# Patient Record
Sex: Male | Born: 1935 | Race: White | Hispanic: No | Marital: Married | Smoking: Former smoker
Health system: Southern US, Community
[De-identification: ages and names within clinical notes are randomized; demographics above are authoritative.]

## PROBLEM LIST (undated history)

## (undated) DIAGNOSIS — N400 Enlarged prostate without lower urinary tract symptoms: Secondary | ICD-10-CM

## (undated) DIAGNOSIS — K2970 Gastritis, unspecified, without bleeding: Secondary | ICD-10-CM

## (undated) DIAGNOSIS — R42 Dizziness and giddiness: Secondary | ICD-10-CM

## (undated) DIAGNOSIS — I10 Essential (primary) hypertension: Secondary | ICD-10-CM

## (undated) DIAGNOSIS — K648 Other hemorrhoids: Secondary | ICD-10-CM

## (undated) DIAGNOSIS — I2510 Atherosclerotic heart disease of native coronary artery without angina pectoris: Secondary | ICD-10-CM

## (undated) DIAGNOSIS — J189 Pneumonia, unspecified organism: Secondary | ICD-10-CM

## (undated) DIAGNOSIS — R61 Generalized hyperhidrosis: Secondary | ICD-10-CM

## (undated) DIAGNOSIS — M1990 Unspecified osteoarthritis, unspecified site: Secondary | ICD-10-CM

## (undated) DIAGNOSIS — K5730 Diverticulosis of large intestine without perforation or abscess without bleeding: Secondary | ICD-10-CM

## (undated) DIAGNOSIS — F0390 Unspecified dementia without behavioral disturbance: Secondary | ICD-10-CM

## (undated) DIAGNOSIS — E119 Type 2 diabetes mellitus without complications: Secondary | ICD-10-CM

## (undated) DIAGNOSIS — R002 Palpitations: Secondary | ICD-10-CM

## (undated) DIAGNOSIS — E538 Deficiency of other specified B group vitamins: Secondary | ICD-10-CM

## (undated) DIAGNOSIS — F419 Anxiety disorder, unspecified: Secondary | ICD-10-CM

## (undated) DIAGNOSIS — Z860100 Personal history of colon polyps, unspecified: Secondary | ICD-10-CM

## (undated) DIAGNOSIS — K219 Gastro-esophageal reflux disease without esophagitis: Secondary | ICD-10-CM

## (undated) DIAGNOSIS — K7581 Nonalcoholic steatohepatitis (NASH): Secondary | ICD-10-CM

## (undated) DIAGNOSIS — Z7901 Long term (current) use of anticoagulants: Secondary | ICD-10-CM

## (undated) DIAGNOSIS — K745 Biliary cirrhosis, unspecified: Secondary | ICD-10-CM

## (undated) DIAGNOSIS — I480 Paroxysmal atrial fibrillation: Secondary | ICD-10-CM

## (undated) DIAGNOSIS — IMO0002 Reserved for concepts with insufficient information to code with codable children: Secondary | ICD-10-CM

## (undated) DIAGNOSIS — E785 Hyperlipidemia, unspecified: Secondary | ICD-10-CM

## (undated) DIAGNOSIS — K2990 Gastroduodenitis, unspecified, without bleeding: Secondary | ICD-10-CM

## (undated) DIAGNOSIS — J449 Chronic obstructive pulmonary disease, unspecified: Secondary | ICD-10-CM

## (undated) DIAGNOSIS — I219 Acute myocardial infarction, unspecified: Secondary | ICD-10-CM

## (undated) DIAGNOSIS — N189 Chronic kidney disease, unspecified: Secondary | ICD-10-CM

## (undated) HISTORY — DX: Nonalcoholic steatohepatitis (NASH): K75.81

## (undated) HISTORY — DX: Acute myocardial infarction, unspecified: I21.9

## (undated) HISTORY — DX: Chronic obstructive pulmonary disease, unspecified: J44.9

## (undated) HISTORY — DX: Anxiety disorder, unspecified: F41.9

## (undated) HISTORY — DX: Generalized hyperhidrosis: R61

## (undated) HISTORY — DX: Atherosclerotic heart disease of native coronary artery without angina pectoris: I25.10

## (undated) HISTORY — DX: Gastroduodenitis, unspecified, without bleeding: K29.90

## (undated) HISTORY — DX: Essential (primary) hypertension: I10

## (undated) HISTORY — DX: Reserved for concepts with insufficient information to code with codable children: IMO0002

## (undated) HISTORY — DX: Gastritis, unspecified, without bleeding: K29.70

## (undated) HISTORY — PX: EYE SURGERY: SHX253

## (undated) HISTORY — DX: Personal history of colon polyps, unspecified: Z86.0100

## (undated) HISTORY — DX: Type 2 diabetes mellitus without complications: E11.9

## (undated) HISTORY — DX: Paroxysmal atrial fibrillation: I48.0

## (undated) HISTORY — DX: Personal history of colonic polyps: Z86.010

## (undated) HISTORY — DX: Dizziness and giddiness: R42

## (undated) HISTORY — DX: Benign prostatic hyperplasia without lower urinary tract symptoms: N40.0

## (undated) HISTORY — DX: Diverticulosis of large intestine without perforation or abscess without bleeding: K57.30

## (undated) HISTORY — DX: Gastro-esophageal reflux disease without esophagitis: K21.9

## (undated) HISTORY — DX: Hyperlipidemia, unspecified: E78.5

## (undated) HISTORY — DX: Deficiency of other specified B group vitamins: E53.8

## (undated) HISTORY — DX: Unspecified osteoarthritis, unspecified site: M19.90

## (undated) HISTORY — DX: Biliary cirrhosis, unspecified: K74.5

## (undated) HISTORY — PX: CATARACT EXTRACTION: SUR2

## (undated) HISTORY — DX: Other hemorrhoids: K64.8

## (undated) HISTORY — DX: Palpitations: R00.2

---

## 1999-04-21 ENCOUNTER — Encounter (INDEPENDENT_AMBULATORY_CARE_PROVIDER_SITE_OTHER): Payer: Self-pay | Admitting: Specialist

## 1999-04-21 ENCOUNTER — Other Ambulatory Visit: Admission: RE | Admit: 1999-04-21 | Discharge: 1999-04-21 | Payer: Self-pay | Admitting: Gastroenterology

## 1999-09-27 ENCOUNTER — Encounter: Payer: Self-pay | Admitting: Urology

## 1999-09-29 ENCOUNTER — Encounter (INDEPENDENT_AMBULATORY_CARE_PROVIDER_SITE_OTHER): Payer: Self-pay

## 1999-09-29 ENCOUNTER — Ambulatory Visit (HOSPITAL_COMMUNITY): Admission: RE | Admit: 1999-09-29 | Discharge: 1999-09-29 | Payer: Self-pay | Admitting: Urology

## 2001-02-05 ENCOUNTER — Ambulatory Visit (HOSPITAL_COMMUNITY): Admission: RE | Admit: 2001-02-05 | Discharge: 2001-02-05 | Payer: Self-pay | Admitting: Gastroenterology

## 2001-02-05 ENCOUNTER — Encounter (INDEPENDENT_AMBULATORY_CARE_PROVIDER_SITE_OTHER): Payer: Self-pay

## 2001-02-05 ENCOUNTER — Encounter: Payer: Self-pay | Admitting: Gastroenterology

## 2002-07-08 DIAGNOSIS — D126 Benign neoplasm of colon, unspecified: Secondary | ICD-10-CM

## 2002-11-04 ENCOUNTER — Encounter: Payer: Self-pay | Admitting: Gastroenterology

## 2005-10-06 ENCOUNTER — Ambulatory Visit: Payer: Self-pay | Admitting: Gastroenterology

## 2005-10-11 ENCOUNTER — Ambulatory Visit: Payer: Self-pay | Admitting: Gastroenterology

## 2005-10-12 ENCOUNTER — Ambulatory Visit: Payer: Self-pay | Admitting: Gastroenterology

## 2006-10-05 ENCOUNTER — Ambulatory Visit: Payer: Self-pay | Admitting: Gastroenterology

## 2006-10-05 LAB — CONVERTED CEMR LAB
ALT: 25 units/L (ref 0–40)
AST: 25 units/L (ref 0–37)
Albumin: 3.8 g/dL (ref 3.5–5.2)
Alkaline Phosphatase: 75 units/L (ref 39–117)
BUN: 29 mg/dL — ABNORMAL HIGH (ref 6–23)
Basophils Absolute: 0 10*3/uL (ref 0.0–0.1)
Basophils Relative: 0.6 % (ref 0.0–1.0)
CO2: 25 meq/L (ref 19–32)
Calcium: 9 mg/dL (ref 8.4–10.5)
Chloride: 102 meq/L (ref 96–112)
Creatinine, Ser: 1.3 mg/dL (ref 0.4–1.5)
Eosinophil percent: 3.3 % (ref 0.0–5.0)
GFR calc non Af Amer: 57 mL/min
Glomerular Filtration Rate, Af Am: 68 mL/min/{1.73_m2}
Glucose, Bld: 120 mg/dL — ABNORMAL HIGH (ref 70–99)
HCT: 39.8 % (ref 39.0–52.0)
Hemoglobin: 13.2 g/dL (ref 13.0–17.0)
INR: 1.1 (ref 0.9–2.0)
Lymphocytes Relative: 26 % (ref 12.0–46.0)
MCHC: 33.3 g/dL (ref 30.0–36.0)
MCV: 92.9 fL (ref 78.0–100.0)
Monocytes Absolute: 0.4 10*3/uL (ref 0.2–0.7)
Monocytes Relative: 6.1 % (ref 3.0–11.0)
Neutro Abs: 4.2 10*3/uL (ref 1.4–7.7)
Neutrophils Relative %: 64 % (ref 43.0–77.0)
Platelets: 194 10*3/uL (ref 150–400)
Potassium: 3.7 meq/L (ref 3.5–5.1)
Prothrombin Time: 12.8 s (ref 10.0–14.0)
RBC: 4.29 M/uL (ref 4.22–5.81)
RDW: 13.4 % (ref 11.5–14.6)
Sodium: 136 meq/L (ref 135–145)
Total Bilirubin: 0.8 mg/dL (ref 0.3–1.2)
Total Protein: 7.1 g/dL (ref 6.0–8.3)
WBC: 6.5 10*3/uL (ref 4.5–10.5)

## 2006-10-10 ENCOUNTER — Ambulatory Visit: Payer: Self-pay | Admitting: Gastroenterology

## 2007-09-21 ENCOUNTER — Ambulatory Visit: Payer: Self-pay | Admitting: Gastroenterology

## 2007-09-21 ENCOUNTER — Ambulatory Visit (HOSPITAL_COMMUNITY): Admission: RE | Admit: 2007-09-21 | Discharge: 2007-09-21 | Payer: Self-pay | Admitting: Gastroenterology

## 2007-09-21 LAB — CONVERTED CEMR LAB
AFP-Tumor Marker: 2.4 ng/mL (ref 0.0–8.0)
Ammonia: 12 umol/L (ref 11–35)
Basophils Absolute: 0 10*3/uL (ref 0.0–0.1)
Basophils Relative: 0.6 % (ref 0.0–1.0)
Eosinophils Absolute: 0.1 10*3/uL (ref 0.0–0.6)
Eosinophils Relative: 1.9 % (ref 0.0–5.0)
HCT: 36.5 % — ABNORMAL LOW (ref 39.0–52.0)
Hemoglobin: 12.6 g/dL — ABNORMAL LOW (ref 13.0–17.0)
INR: 1 (ref 0.8–1.0)
Lymphocytes Relative: 25 % (ref 12.0–46.0)
MCHC: 34.4 g/dL (ref 30.0–36.0)
MCV: 93.9 fL (ref 78.0–100.0)
Monocytes Absolute: 0.4 10*3/uL (ref 0.2–0.7)
Monocytes Relative: 5.6 % (ref 3.0–11.0)
Neutro Abs: 4.9 10*3/uL (ref 1.4–7.7)
Neutrophils Relative %: 66.9 % (ref 43.0–77.0)
Platelets: 177 10*3/uL (ref 150–400)
Prothrombin Time: 12.4 s (ref 10.9–13.3)
RBC: 3.88 M/uL — ABNORMAL LOW (ref 4.22–5.81)
RDW: 12.9 % (ref 11.5–14.6)
WBC: 7.2 10*3/uL (ref 4.5–10.5)

## 2007-10-02 ENCOUNTER — Ambulatory Visit: Payer: Self-pay | Admitting: Gastroenterology

## 2007-10-02 LAB — CONVERTED CEMR LAB
ALT: 26 U/L
AST: 23 U/L
Albumin: 3.7 g/dL
Alkaline Phosphatase: 94 U/L
Bilirubin, Direct: 0.1 mg/dL
Total Bilirubin: 0.7 mg/dL
Total Protein: 7.6 g/dL

## 2007-11-19 DIAGNOSIS — Z87898 Personal history of other specified conditions: Secondary | ICD-10-CM

## 2007-11-19 DIAGNOSIS — K648 Other hemorrhoids: Secondary | ICD-10-CM | POA: Insufficient documentation

## 2007-11-19 DIAGNOSIS — E669 Obesity, unspecified: Secondary | ICD-10-CM

## 2007-11-19 DIAGNOSIS — E785 Hyperlipidemia, unspecified: Secondary | ICD-10-CM

## 2007-11-19 DIAGNOSIS — K745 Biliary cirrhosis, unspecified: Secondary | ICD-10-CM

## 2007-11-19 DIAGNOSIS — I10 Essential (primary) hypertension: Secondary | ICD-10-CM | POA: Insufficient documentation

## 2007-11-19 DIAGNOSIS — K5730 Diverticulosis of large intestine without perforation or abscess without bleeding: Secondary | ICD-10-CM | POA: Insufficient documentation

## 2007-11-19 DIAGNOSIS — K219 Gastro-esophageal reflux disease without esophagitis: Secondary | ICD-10-CM | POA: Insufficient documentation

## 2007-11-23 ENCOUNTER — Emergency Department (HOSPITAL_COMMUNITY): Admission: EM | Admit: 2007-11-23 | Discharge: 2007-11-23 | Payer: Self-pay | Admitting: Emergency Medicine

## 2008-05-12 ENCOUNTER — Telehealth: Payer: Self-pay | Admitting: Gastroenterology

## 2008-09-10 ENCOUNTER — Encounter: Payer: Self-pay | Admitting: Gastroenterology

## 2009-03-19 ENCOUNTER — Telehealth: Payer: Self-pay | Admitting: Gastroenterology

## 2009-03-27 ENCOUNTER — Ambulatory Visit: Payer: Self-pay | Admitting: Gastroenterology

## 2009-03-27 LAB — CONVERTED CEMR LAB
ALT: 21 units/L (ref 0–53)
AST: 23 units/L (ref 0–37)
Albumin: 3.7 g/dL (ref 3.5–5.2)
Alkaline Phosphatase: 80 units/L (ref 39–117)
BUN: 28 mg/dL — ABNORMAL HIGH (ref 6–23)
Basophils Absolute: 0 10*3/uL (ref 0.0–0.1)
Basophils Relative: 0.7 % (ref 0.0–3.0)
Bilirubin, Direct: 0.2 mg/dL (ref 0.0–0.3)
CO2: 30 meq/L (ref 19–32)
Calcium: 8.9 mg/dL (ref 8.4–10.5)
Chloride: 109 meq/L (ref 96–112)
Creatinine, Ser: 1.3 mg/dL (ref 0.4–1.5)
Eosinophils Absolute: 0.1 10*3/uL (ref 0.0–0.7)
Eosinophils Relative: 1.8 % (ref 0.0–5.0)
GFR calc non Af Amer: 56.17 mL/min (ref >60)
Glucose, Bld: 131 mg/dL — ABNORMAL HIGH (ref 70–99)
HCT: 37.6 % — ABNORMAL LOW (ref 39.0–52.0)
Hemoglobin: 12.8 g/dL — ABNORMAL LOW (ref 13.0–17.0)
Lymphocytes Relative: 21.5 % (ref 12.0–46.0)
Lymphs Abs: 1.2 10*3/uL (ref 0.7–4.0)
MCHC: 34.2 g/dL (ref 30.0–36.0)
MCV: 94.9 fL (ref 78.0–100.0)
Monocytes Absolute: 0.3 10*3/uL (ref 0.1–1.0)
Monocytes Relative: 5.7 % (ref 3.0–12.0)
Neutro Abs: 4.2 10*3/uL (ref 1.4–7.7)
Neutrophils Relative %: 70.3 % (ref 43.0–77.0)
Platelets: 165 10*3/uL (ref 150.0–400.0)
Potassium: 4.8 meq/L (ref 3.5–5.1)
RBC: 3.96 M/uL — ABNORMAL LOW (ref 4.22–5.81)
RDW: 13.7 % (ref 11.5–14.6)
Sodium: 142 meq/L (ref 135–145)
TSH: 2.14 microintl units/mL (ref 0.35–5.50)
Total Bilirubin: 1 mg/dL (ref 0.3–1.2)
Total Protein: 7.3 g/dL (ref 6.0–8.3)
WBC: 5.8 10*3/uL (ref 4.5–10.5)

## 2009-04-07 ENCOUNTER — Ambulatory Visit: Payer: Self-pay | Admitting: Gastroenterology

## 2009-10-09 ENCOUNTER — Encounter: Payer: Self-pay | Admitting: Gastroenterology

## 2010-11-03 ENCOUNTER — Encounter: Payer: Self-pay | Admitting: Gastroenterology

## 2010-12-02 NOTE — Medication Information (Signed)
Summary: Prior autho & approved for Pantoprazole/[employer]  Prior autho & approved for Pantoprazole/[employer]   Imported By: Sherian Rein 11/09/2010 14:41:10  _____________________________________________________________________  External Attachment:    Type:   Image     Comment:   External Document

## 2011-01-11 ENCOUNTER — Encounter: Payer: Self-pay | Admitting: Gastroenterology

## 2011-02-21 ENCOUNTER — Other Ambulatory Visit: Payer: Self-pay | Admitting: Emergency Medicine

## 2011-02-21 DIAGNOSIS — S2239XA Fracture of one rib, unspecified side, initial encounter for closed fracture: Secondary | ICD-10-CM

## 2011-03-02 ENCOUNTER — Ambulatory Visit
Admission: RE | Admit: 2011-03-02 | Discharge: 2011-03-02 | Disposition: A | Discharge status: Home / Self Care | Payer: Medicare Other | Source: Ambulatory Visit | Attending: Emergency Medicine | Admitting: Emergency Medicine

## 2011-03-02 DIAGNOSIS — S2239XA Fracture of one rib, unspecified side, initial encounter for closed fracture: Secondary | ICD-10-CM

## 2011-03-14 ENCOUNTER — Institutional Professional Consult (permissible substitution): Payer: Medicare Other | Admitting: Cardiovascular Disease

## 2011-03-15 NOTE — Assessment & Plan Note (Signed)
[facility]                         GASTROENTEROLOGY OFFICE NOTE   Lawrence, Cory Lawrence                 MRN:          [id number]  DATE:10/02/2007                            DOB:          03-Jan-1925    Cory Lawrence comes in for his yearly checkup and has no complaints.  He is  taking regular pantoprazole for his acid reflux and he also takes Urso  250 mg twice a day for his early primary biliary cirrhosis.  He has  hyperlipidemia, additionally he takes Lipitor 10 mg a day, Vitamin E 400  units a day, triamterene-HCTZ 37.5-25 mg a day, multivitamins, and a  variety of inhalers.  He does have rather marked COPD and is followed by  Dr. Cleta Alberts.   As per my previous notes, Cory Lawrence has a combination of fatty liver from his  hyperlipidemia and obesity and also early primary biliary cirrhosis with  granulomatous liver disease per a previous liver biopsy in 2002.  He has  really done well on medical therapy.  Repeat workup recently showed a  negative gallbladder ultrasound on September 21, 2007, but some diffuse  fatty infiltration in the liver.  Alpha fetoprotein level is normal at  2.4 and CBC showed a white count of 7200, hemoglobin 12.6 and a platelet  count of 177,000.  Liver function tests were ordered but were not  performed.   EXAM:  He is a healthy-appearing white male in no distress, appearing  older than his stated age.  He weighs 180 pounds and blood pressure is  150/60.  Pulse was 68 and regular.  I could not appreciate icterus or  other stigmata of chronic liver disease.  He had marked diminished  breath sounds in both lung fields without wheezes or rhonchi.  He  appeared to be in a regular rhythm without murmurs, gallops or rubs.  ABDOMEN:  Showed no definite organomegaly, masses, tenderness, ascites,  etc.  Peripheral extremities showed no edema or phlebitis or swollen  joints.  MENTAL STATUS:  Clear.  He was oriented x3 and there was no  asterixis.   ASSESSMENT:  1. Early primary biliary cirrhosis and fatty infiltration of his      liver, doing well on Urso therapy.  The patient is not diabetic to      my knowledge.  His liver enzymes are pending to see whether or not      he has an element of NASH syndrome.  2. Chronic gastroesophageal reflux disease, on Protonix therapy.  His      last endoscopy and colonoscopy were in January of 2004.  He did      have some mild portal gastropathy noted at that time.  3. Chronic hyperlipidemia, on Lipitor.  4. Rather marked chronic obstructive pulmonary disease.  5. Benign prostatic hypertrophy, followed by Dr. Larey Dresser.  6. History of hypertensive cardiovascular disease.   RECOMMENDATIONS:  1. Continue all medications as listed above and will check liver      enzymes.  If his liver enzymes are elevated we may consider Actos      therapy because of recent information concerning its use in  fatty      liver in terms of decreasing inflammation and scarring.  2. Continue all medications as listed above.  3. Regular followup with Dr. Cleta Alberts as previously planned.  4. Yearly GI followup unless otherwise indicated.     Vania Rea. Jarold Motto, MD, Caleen Essex, FAGA  Electronically Signed    DRP/MedQ  DD: 10/02/2007  DT: 10/02/2007  Job #: [id number]

## 2011-03-17 ENCOUNTER — Ambulatory Visit (INDEPENDENT_AMBULATORY_CARE_PROVIDER_SITE_OTHER): Payer: Medicare Other | Admitting: Cardiovascular Disease

## 2011-03-17 ENCOUNTER — Encounter: Payer: Self-pay | Admitting: Cardiovascular Disease

## 2011-03-17 VITALS — BP 130/58 | HR 67 | Ht 65.0 in | Wt 164.8 lb

## 2011-03-17 DIAGNOSIS — R079 Chest pain, unspecified: Secondary | ICD-10-CM | POA: Insufficient documentation

## 2011-03-17 DIAGNOSIS — I2510 Atherosclerotic heart disease of native coronary artery without angina pectoris: Secondary | ICD-10-CM

## 2011-03-17 DIAGNOSIS — R002 Palpitations: Secondary | ICD-10-CM | POA: Insufficient documentation

## 2011-03-17 DIAGNOSIS — R55 Syncope and collapse: Secondary | ICD-10-CM | POA: Insufficient documentation

## 2011-03-17 DIAGNOSIS — R42 Dizziness and giddiness: Secondary | ICD-10-CM | POA: Insufficient documentation

## 2011-03-17 DIAGNOSIS — K219 Gastro-esophageal reflux disease without esophagitis: Secondary | ICD-10-CM | POA: Insufficient documentation

## 2011-03-17 DIAGNOSIS — N400 Enlarged prostate without lower urinary tract symptoms: Secondary | ICD-10-CM | POA: Insufficient documentation

## 2011-03-17 DIAGNOSIS — R0789 Other chest pain: Secondary | ICD-10-CM | POA: Insufficient documentation

## 2011-03-17 DIAGNOSIS — R61 Generalized hyperhidrosis: Secondary | ICD-10-CM | POA: Insufficient documentation

## 2011-03-17 DIAGNOSIS — J449 Chronic obstructive pulmonary disease, unspecified: Secondary | ICD-10-CM | POA: Insufficient documentation

## 2011-03-17 NOTE — Progress Notes (Signed)
Cory Lawrence Date of Birth  [birth year] [facility] / [facility] [address]     [address] [place], [place]  [address] [phone]  Fax  [phone]  History of Present Illness:  Pt fell and cracked 2 ribs.  CT scan revealed coronary calcification.  No dyspnea. No chest pain.  Does not get any regular exercise.  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  No syncope.  Watches his salt.  He eats a fairly unrestricted diet. His wife thinks is a very healthy diet but eats junk food. He checks his glucose levels only once every 3 days.   Current Outpatient Prescriptions  Medication Sig Dispense Refill  . aspirin 81 MG tablet Take 81 mg by mouth daily.        . cholecalciferol (VITAMIN D) 1000 UNITS tablet Take 1,000 Units by mouth daily.        Marland Kitchen doxazosin (CARDURA) 4 MG tablet Take 4 mg by mouth at bedtime.        . Fluticasone-Salmeterol (ADVAIR DISKUS) 250-50 MCG/DOSE AEPB Inhale 1 puff into the lungs as needed.       . Ipratropium-Albuterol (COMBIVENT IN) Inhale into the lungs as needed.       . Melatonin 3 MG CAPS Take by mouth as needed.        . Multiple Vitamin (MULTIVITAMIN) tablet Take 1 tablet by mouth daily.        . Omega-3 Fatty Acids (FISH OIL) 1000 MG CAPS Take by mouth daily.        . pantoprazole (PROTONIX) 40 MG tablet Take 40 mg by mouth daily.        . simvastatin (ZOCOR) 40 MG tablet Take 40 mg by mouth at bedtime.        . triamcinolone (KENALOG) 0.1 % cream Apply 1 application topically daily.       Marland Kitchen triamterene-hydrochlorothiazide (MAXZIDE-25) 37.5-25 MG per tablet Take 1 tablet by mouth daily.        Marland Kitchen Ursodiol (URSO 250 PO) Take by mouth 2 (two) times daily.       . vitamin E 400 UNIT capsule Take 400 Units by mouth daily.           Allergies  Allergen Reactions  . Penicillins     Past Medical History  Diagnosis Date  . Syncope and collapse   . Dizziness   . Diaphoresis   . Palpitations   . COPD  (chronic obstructive pulmonary disease)   . Hypertension   . Hyperlipidemia   . GERD (gastroesophageal reflux disease)   . BPH (benign prostatic hypertrophy)     Past Surgical History  Procedure Date  . Colonoscopy w/ endoscopic [place] 10/2002    History  Smoking status  . Never Smoker   Smokeless tobacco  . Not on file    History  Alcohol Use No    Family History  Problem Relation Age of Onset  . Heart failure Mother   . Diabetes Mother   . Diabetes Sister   . Heart failure Sister     Reviw of Systems:  Reviewed in the HPI.  All other systems are negative.  Physical Exam: BP 130/58  Pulse 67  Ht 5\' 5"  (1.651 m)  Wt 164 lb 12.8 oz (74.753 kg)  BMI 27.42 kg/m2 The patient is alert and oriented x 3.  The mood and affect are normal.  The skin is warm and dry.  Color is normal.  The HEENT exam reveals that the sclera are nonicteric.  The mucous membranes are moist.  The carotids are 2+ without bruits.  There is no thyromegaly.  There is no JVD.  The lungs have a few wheezes.  The chest wall is non tender.  The heart exam reveals a regular rate with a normal S1 and  A high pitched S2.  There are no murmurs, gallops, or rubs.  The PMI is not displaced.   Abdominal exam reveals good bowel sounds.  There is no guarding or rebound.  There is no hepatosplenomegaly or tenderness.  There are no masses.  Exam of the legs reveal no clubbing, cyanosis, or edema.  The legs are without rashes.  The distal pulses are intact.  Cranial nerves II - XII are intact.  Motor and sensory functions are intact.  The gait is normal.  ECG: Normal sinus rhythm. There is left axis deviation.  Assessment / Plan:

## 2011-03-17 NOTE — Assessment & Plan Note (Signed)
He was incidentally found have coronary calcifications. We will schedule him for a LexiScan IVU study. He does not have any symptoms of coronary ischemia although he really is not that active. I've asked him to walk several miles a day. I've asked him to watch his diet and to eliminate or minimize anything that his right, weak, or suite. I'll see him back in the office in several months following his LexiScan Myoview study.

## 2011-03-18 NOTE — Assessment & Plan Note (Signed)
[facility]                         GASTROENTEROLOGY OFFICE NOTE   Cory Lawrence, Cory Lawrence                 MRN:          [id number]  DATE:10/10/2006                            DOB:          1925-06-21    Cory Lawrence has no complaints today with his acid reflux as long as he takes  daily Protonix. He has fatty infiltration of his liver but has normal  liver function tests on recent blood work within the last week. His last  ultrasound was in January 2004 and showed an enlarged echo dense liver  with some cyst in his right kidney. There has been no evidence of  ongoing hepatic inflammation or cirrhosis. He takes Urso 250 mg twice a  day for his fatty liver. He also is on Lipitor 10 mg a day. He has  asthmatic bronchitis and chronic thyroid dysfunction followed by Dr.  Cleta Alberts. Further lab data from December 7 showed a normal alpha-fetoprotein  level and a slightly elevated ammonia of 37, normal being 11-35.   PHYSICAL EXAMINATION:  His exam today shows him to be awake and alert in  no acute distress.  He weighs 181 pounds which is really unchanged from the last 2 years and  blood pressure 158/58. Pulse was 76 and regular. I could not appreciate  stigmata of chronic liver disease. There was no asterixis. I could not  appreciate hepatosplenomegaly, abdominal masses or tenderness. Bowel  sounds were normal.   RECOMMENDATIONS:  1. Continue Urso with yearly liver function tests.  2. Continue Protonix with reflux regime.  3. Other medications per Dr. Cleta Alberts.  4. The patient is up-to-date on his colonoscopy with the last exam in      December 2006. He does have mild diverticulosis coli.   ADDENDUM:  Previous endoscopies have shown evidence of portal  gastropathy. His recent CBC was normal without evidence of microcytic  indices.     Cory Rea. Jarold Motto, MD, Caleen Essex, FAGA  Electronically Signed    DRP/MedQ  DD: 10/10/2006  DT: 10/10/2006  Job #: [id number]   cc:   Brett Canales A. Cleta Alberts, M.D.

## 2011-03-22 ENCOUNTER — Ambulatory Visit (HOSPITAL_COMMUNITY): Payer: Medicare Other | Attending: Cardiovascular Disease | Admitting: Radiology

## 2011-03-22 VITALS — Ht 65.0 in | Wt 164.0 lb

## 2011-03-22 DIAGNOSIS — I4949 Other premature depolarization: Secondary | ICD-10-CM

## 2011-03-22 DIAGNOSIS — R55 Syncope and collapse: Secondary | ICD-10-CM

## 2011-03-22 DIAGNOSIS — I2510 Atherosclerotic heart disease of native coronary artery without angina pectoris: Secondary | ICD-10-CM | POA: Insufficient documentation

## 2011-03-22 MED ORDER — REGADENOSON 0.4 MG/5ML IV SOLN
0.4000 mg | Freq: Once | INTRAVENOUS | Status: AC
  Administered 2011-03-22: 0.4 mg via INTRAVENOUS

## 2011-03-22 MED ORDER — TECHNETIUM TC 99M TETROFOSMIN IV KIT
11.0000 | PACK | Freq: Once | INTRAVENOUS | Status: AC | PRN
  Administered 2011-03-22: 11 via INTRAVENOUS

## 2011-03-22 MED ORDER — TECHNETIUM TC 99M TETROFOSMIN IV KIT
33.0000 | PACK | Freq: Once | INTRAVENOUS | Status: AC | PRN
  Administered 2011-03-22: 33 via INTRAVENOUS

## 2011-03-22 NOTE — Progress Notes (Signed)
[facility] SITE 3 NUCLEAR MED [address] [place] [address] [phone]  Cardiology Nuclear Med Study  Cory Lawrence is a 77 y.o. male [id number] Aug 21, 1925   Nuclear Med Background Indication for Stress Test:  Evaluation for Ischemia and Coronary calcifications seen on CT 03/02/11 secondary to fall History:  COPD Cardiac Risk Factors: Family History - CAD, Hypertension and Lipids  Symptoms:  Dizziness, Palpitations and Syncope   Nuclear Pre-Procedure Caffeine/Decaff Intake:  None NPO After: 7:30pm   Lungs: clear IV 0.9% NS with Angio Cath:  20g  IV Site: R Wrist  IV Started by:  Cathlyn Parsons, RN  Chest Size (in):  42 Cup Size: n/a  Height: 5\' 5"  (1.651 m)  Weight:  164 lb (74.39 kg)  BMI:  Body mass index is 27.29 kg/(m^2). Tech Comments:  NA    Nuclear Med Study 1 or 2 day study: 1 day  Stress Test Type:  Eugenie Birks  Reading MD: Charlton Haws, MD  Order Authorizing Provider:  P.Nahser  Resting Radionuclide: Technetium 46m Tetrofosmin  Resting Radionuclide Dose: 11 mCi   Stress Radionuclide:  Technetium 52m Tetrofosmin  Stress Radionuclide Dose: 33 mCi           Stress Protocol Rest HR: 67 Stress HR: 93  Rest BP: 132 Stress BP: 125/57  Exercise Time (min): n/a METS: n/a   Predicted Max HR: 136 bpm % Max HR: 68.38 bpm Rate Pressure Product: 52841   Dose of Adenosine (mg):  n/a Dose of Lexiscan: 0.4 mg  Dose of Atropine (mg): n/a Dose of Dobutamine: n/a mcg/kg/min (at max HR)  Stress Test Technologist: Milana Na, EMT-P  Nuclear Technologist:  Domenic Polite, CNMT     Rest Procedure:  Myocardial perfusion imaging was performed at rest 45 minutes following the intravenous administration of Technetium 33m Tetrofosmin. Rest ECG: NSR  Stress Procedure:  The patient received IV Lexiscan 0.4 mg over 15-seconds.  Technetium 55m Tetrofosmin injected at 30-seconds.  There were no significant changes and freq pvcs with Lexiscan.   Quantitative spect images were obtained after a 45 minute delay. Stress ECG: No significant change from baseline ECG  QPS Raw Data Images:  Normal; no motion artifact; normal heart/lung ratio. Stress Images:  There is decreased uptake in the inferior wall. Rest Images:  There is decreased uptake in the inferior wall. Subtraction (SDS):  SDS 5 abnormal inferior wall Transient Ischemic Dilatation (Normal <1.22):  1.09 Lung/Heart Ratio (Normal <0.45):  0.31  Quantitative Gated Spect Images QGS EDV:  111 ml QGS ESV:  45 ml QGS cine images:  NL LV Function; NL Wall Motion QGS EF: 56%  Impression Exercise Capacity:  Lexiscan with no exercise. BP Response:  Normal blood pressure response. Clinical Symptoms:  No chest pain. ECG Impression:  No significant ST segment change suggestive of ischemia. Comparison with Prior Nuclear Study: No images to compare  Overall Impression:  Small inferior wall infarct from apex to base with no ischemia       Charlton Haws

## 2011-03-23 ENCOUNTER — Telehealth: Payer: Self-pay | Admitting: *Deleted

## 2011-03-23 NOTE — Progress Notes (Signed)
COPY ROUTED TO DR. Elease Hashimoto.Mirna Mires

## 2011-03-23 NOTE — Telephone Encounter (Signed)
Called home number no answer, will try again

## 2011-03-23 NOTE — Telephone Encounter (Signed)
Dr wants f/u with in next month for results of nuc test. Unable to leave msg, no answer.Alfonso Ramus RN

## 2011-03-24 ENCOUNTER — Telehealth: Payer: Self-pay | Admitting: *Deleted

## 2011-03-24 NOTE — Telephone Encounter (Signed)
Called again, no answer

## 2011-03-24 NOTE — Telephone Encounter (Signed)
attempt to schedule app for 1 mo office visit, f/u to nuc test, no answer and msg machine full so msg cant be left.Alfonso Ramus RN

## 2011-04-07 NOTE — Telephone Encounter (Signed)
Wife called, number is correct. States they havent been getting calls. She will look into her phone service.

## 2011-04-07 NOTE — Telephone Encounter (Signed)
Called again unable to leave [employer] RN

## 2011-04-22 ENCOUNTER — Other Ambulatory Visit: Payer: Self-pay | Admitting: Gastroenterology

## 2011-04-22 ENCOUNTER — Encounter: Payer: Self-pay | Admitting: Cardiovascular Disease

## 2011-04-22 ENCOUNTER — Ambulatory Visit (INDEPENDENT_AMBULATORY_CARE_PROVIDER_SITE_OTHER): Payer: Medicare Other | Admitting: Cardiovascular Disease

## 2011-04-22 VITALS — BP 138/78 | HR 66 | Wt 163.0 lb

## 2011-04-22 DIAGNOSIS — I2510 Atherosclerotic heart disease of native coronary artery without angina pectoris: Secondary | ICD-10-CM

## 2011-04-22 NOTE — Assessment & Plan Note (Signed)
Cory Lawrence has an elevated coronary calcium score and now has been found to have a fixed inferolateral defect on Myoview scanning consistent with a previous inferior wall myocardial infarction. He remains completely asymptomatic. He doesn't recall any previous episodes of chest pain that could have been a myocardial infarction.  Cory Lawrence is under a lot of stress from some family problems. I've recommended that we proceed with a cardiac catheterization but he would like to wait until later in the year to do cardiac catheterization. At this point I think that that is a reasonable request. He is  77 years old and is completely asymptomatic. It is likely that he has had an inferior wall myocardial infarction but  his left ventricular systolic function remains normal and he is asymptomatic.  I'll see him in 6 months.

## 2011-04-22 NOTE — Progress Notes (Signed)
Cory Lawrence Date of Birth  1925/05/15 [facility] / [facility] [address]     [address] [place], [place]  [address] [phone]  Fax  [phone]  History of Present Illness:  Cory Lawrence is doing fairly well since I last saw him. He continues to be asymptomatic. He's never had any cardiac complaints. He is able to do all his normal activities without any significant problems.  He originally presented to me with an elevated coronary calcium score. We performed a Lexiscan Myoview study which revealed a fixed inferior wall defect consistent with a previous inferior wall myocardial infarction.  He has never had any symptoms consistent with an inferior wall myocardial infarction.  Current Outpatient Prescriptions on File Prior to Visit  Medication Sig Dispense Refill  . aspirin 81 MG tablet Take 81 mg by mouth daily.        . cholecalciferol (VITAMIN D) 1000 UNITS tablet Take 1,000 Units by mouth daily.        Marland Kitchen doxazosin (CARDURA) 4 MG tablet Take 4 mg by mouth at bedtime.        . Fluticasone-Salmeterol (ADVAIR DISKUS) 250-50 MCG/DOSE AEPB Inhale 1 puff into the lungs as needed.       . Ipratropium-Albuterol (COMBIVENT IN) Inhale into the lungs as needed.       . Melatonin 3 MG CAPS Take by mouth as needed.        . Multiple Vitamin (MULTIVITAMIN) tablet Take 1 tablet by mouth daily.        . Omega-3 Fatty Acids (FISH OIL) 1000 MG CAPS Take by mouth daily.        . pantoprazole (PROTONIX) 40 MG tablet Take 40 mg by mouth daily.        . simvastatin (ZOCOR) 40 MG tablet Take 40 mg by mouth at bedtime.        . triamcinolone (KENALOG) 0.1 % cream Apply 1 application topically daily.       Marland Kitchen triamterene-hydrochlorothiazide (MAXZIDE-25) 37.5-25 MG per tablet Take 1 tablet by mouth daily.        Marland Kitchen Ursodiol (URSO 250 PO) Take by mouth 2 (two) times daily.       . vitamin E 400 UNIT capsule Take 400 Units by mouth daily.          Allergies  Allergen Reactions   . Penicillins     Past Medical History  Diagnosis Date  . Dizziness   . Diaphoresis   . Palpitations   . COPD (chronic obstructive pulmonary disease)   . Hypertension   . Hyperlipidemia   . GERD (gastroesophageal reflux disease)   . BPH (benign prostatic hypertrophy)     Past Surgical History  Procedure Date  . Colonoscopy w/ endoscopic [place] 10/2002    History  Smoking status  . Never Smoker   Smokeless tobacco  . Not on file    History  Alcohol Use No    Family History  Problem Relation Age of Onset  . Heart failure Mother   . Diabetes Mother   . Diabetes Sister   . Heart failure Sister     Reviw of Systems:  Reviewed in the HPI.  All other systems are negative.  Physical Exam: BP 138/78  Pulse 66  Wt 163 lb (73.936 kg) The patient is alert and oriented x 3.  The mood and affect are normal.   Skin: warm and dry.  Color is normal.    HEENT:  the sclera are nonicteric.  The mucous membranes are moist.  The carotids are 2+ without bruits.  There is no thyromegaly.  There is no JVD.    Lungs: clear.  The chest wall is non tender.    Heart: regular rate with a normal S1 and S2.  There are no murmurs, gallops, or rubs. The PMI is not displaced.     Abdomin: good bowel sounds.  There is no guarding or rebound.  There is no hepatosplenomegaly or tenderness.  There are no masses.   Extremities:  no clubbing, cyanosis, or edema.  The legs are without rashes.  The distal pulses are intact.   Neuro:  Cranial nerves II - XII are intact.  Motor and sensory functions are intact.    The gait is normal.  Assessment / Plan:

## 2011-04-26 ENCOUNTER — Other Ambulatory Visit: Payer: Self-pay | Admitting: Gastroenterology

## 2011-04-26 MED ORDER — URSODIOL 250 MG PO TABS: 250.0000 mg | ORAL_TABLET | Freq: Two times a day (BID) | ORAL | Status: DC

## 2011-04-26 NOTE — Telephone Encounter (Signed)
Advised pt he has not been seen since 2010. Once he comes for appt we will refill his medication.

## 2011-04-26 NOTE — Telephone Encounter (Signed)
i have advised pt that he MUST keep office visit, one refill sent to pharm but no more will be given. He verbalized understanding.

## 2011-05-17 ENCOUNTER — Ambulatory Visit (INDEPENDENT_AMBULATORY_CARE_PROVIDER_SITE_OTHER): Payer: Medicare Other | Admitting: Gastroenterology

## 2011-05-17 ENCOUNTER — Encounter: Payer: Self-pay | Admitting: Gastroenterology

## 2011-05-17 ENCOUNTER — Other Ambulatory Visit (INDEPENDENT_AMBULATORY_CARE_PROVIDER_SITE_OTHER): Payer: Medicare Other

## 2011-05-17 DIAGNOSIS — K7581 Nonalcoholic steatohepatitis (NASH): Secondary | ICD-10-CM

## 2011-05-17 DIAGNOSIS — K7689 Other specified diseases of liver: Secondary | ICD-10-CM

## 2011-05-17 DIAGNOSIS — K745 Biliary cirrhosis, unspecified: Secondary | ICD-10-CM

## 2011-05-17 DIAGNOSIS — K219 Gastro-esophageal reflux disease without esophagitis: Secondary | ICD-10-CM

## 2011-05-17 DIAGNOSIS — K7460 Unspecified cirrhosis of liver: Secondary | ICD-10-CM

## 2011-05-17 DIAGNOSIS — Z860100 Personal history of colon polyps, unspecified: Secondary | ICD-10-CM | POA: Insufficient documentation

## 2011-05-17 DIAGNOSIS — R6889 Other general symptoms and signs: Secondary | ICD-10-CM

## 2011-05-17 LAB — CBC WITH DIFFERENTIAL/PLATELET
Basophils Absolute: 0 10*3/uL (ref 0.0–0.1)
HCT: 34.5 % — ABNORMAL LOW (ref 39.0–52.0)
Lymphs Abs: 1.1 10*3/uL (ref 0.7–4.0)
Monocytes Absolute: 0.4 10*3/uL (ref 0.1–1.0)
Monocytes Relative: 6.4 % (ref 3.0–12.0)
Platelets: 153 10*3/uL (ref 150.0–400.0)
RDW: 14.1 % (ref 11.5–14.6)

## 2011-05-17 LAB — AFP TUMOR MARKER: AFP-Tumor Marker: 1.8 ng/mL (ref 0.0–8.0)

## 2011-05-17 LAB — BASIC METABOLIC PANEL
BUN: 29 mg/dL — ABNORMAL HIGH (ref 6–23)
CO2: 29 mEq/L (ref 19–32)
Chloride: 104 mEq/L (ref 96–112)
Glucose, Bld: 102 mg/dL — ABNORMAL HIGH (ref 70–99)
Potassium: 4.1 mEq/L (ref 3.5–5.1)

## 2011-05-17 LAB — TSH: TSH: 2.77 u[IU]/mL (ref 0.35–5.50)

## 2011-05-17 LAB — AMMONIA: Ammonia: 19 umol/L (ref 11–35)

## 2011-05-17 LAB — HEPATIC FUNCTION PANEL
AST: 18 U/L (ref 0–37)
Albumin: 3.9 g/dL (ref 3.5–5.2)
Alkaline Phosphatase: 75 U/L (ref 39–117)

## 2011-05-17 LAB — FOLATE: Folate: 8.5 ng/mL (ref >5.9)

## 2011-05-17 LAB — IBC PANEL: Transferrin: 262.9 mg/dL (ref 212.0–360.0)

## 2011-05-17 MED ORDER — URSODIOL 250 MG PO TABS: 250.0000 mg | ORAL_TABLET | Freq: Two times a day (BID) | ORAL | Status: DC

## 2011-05-17 MED ORDER — PANTOPRAZOLE SODIUM 40 MG PO TBEC: 40.0000 mg | DELAYED_RELEASE_TABLET | Freq: Every day | ORAL | Status: DC

## 2011-05-17 NOTE — Progress Notes (Signed)
History of Present Illness:  This is a complicated 77 year old Caucasian male with COPD and hypertensive cardiovascular disease. I have followed him for many years because of her primary biliary cirrhosis confirmed by liver biopsy which has been nonprogressive over the last 20 years. Previous ultrasound exams otherwise been unremarkable, and alpha-fetoprotein levels have been normal. The patient is on Urso 250 mg twice a day. He denies pruritus, abdominal pain, edema, ascites, with significant mental status changes. Years ago a small adenomatous polyp was removed by colonoscopy was -5 years ago. Patient does have chronic acid reflux of a severe nature and is on Protonix 40 mg a day, denies any reflux, lower GI, or hepatobiliary complaints. His appetite is good and his weight is stable. Family history is noncontributory except for cryptogenic cirrhosis in his mother. The patient has not smoked in over 10 years, denies previous ethanol abuse.  I have reviewed this patient's present history, medical and surgical past history, allergies and medications.     ROS: The remainder of the 10 point ROS is negative... planned coronary artery catheterization with Dr. Melburn Popper in several weeks. Patient denies angina or symptoms of congestive heart failure. He has had some mild mental status changes but is able to drive himself he read he does have a mild resting tremor. He does take daily aspirin therapy, vitamin E, multivitamins, albuterol, Advair, Cardura, and Maxzide.  Past Medical History  Diagnosis Date  . Dizziness   . Diaphoresis   . Palpitations   . COPD (chronic obstructive pulmonary disease)   . Hypertension   . Hyperlipidemia   . GERD (gastroesophageal reflux disease)   . BPH (benign prostatic hypertrophy)   . Personal history of colonic polyps 07/08/2002    tubular adenoma  . Internal hemorrhoids without mention of complication   . Diverticulosis of colon (without mention of hemorrhage)   . Biliary  cirrhosis   . Internal hemorrhoids without mention of complication   . Unspecified gastritis and gastroduodenitis without mention of hemorrhage   . NASH (nonalcoholic steatohepatitis)    Past Surgical History  Procedure Date  . Cataract extraction     bilateral  . Eye surgery     left eye x multiple    reports that he has quit smoking. He does not have any smokeless tobacco history on file. He reports that he does not drink alcohol or use illicit drugs. family history includes Cancer in his mother; Diabetes in his mother and sister; and Heart failure in his mother and sister.  There is no history of Colon cancer. Allergies  Allergen Reactions  . Penicillins         Physical Exam: General well developed well nourished patient in no acute distress, appearing his stated age Eyes PERRLA, no icterus, fundoscopic exam per opthamologist Skin no lesions noted Neck supple, no adenopathy, no thyroid enlargement, no tenderness Chest clear to percussion and auscultation Heart no significant murmurs, gallops or rubs noted Abdomen no hepatosplenomegaly masses or tenderness, BS normal. ve. Extremities no acute joint lesions, edema, phlebitis or evidence of cellulitis. Neurologic patient oriented x 3, cranial nerves intact, no focal neurologic deficits noted. Resting tremor noted. There is no asterixis or stigmata of chronic liver disease on exam. Psychological mental status normal and normal affect.  Assessment and plan: Nonprogressive primary biliary cirrhosis in an elderly white male who overall is doing extremely well with his health. I do not think he needs colonoscopy at this time, but I have asked him to send in  stool IFOB arch, and we will check CBC and anemia profile, liver profile, and alpha-fetoprotein level. His acid reflux is well controlled on daily Protonix, and he denies dysphagia. I've asked him to continue his Urso at current doses and all his other medications per primary care.  If his stool cards are positive for occult blood, we'll proceed with followup endoscopic exams.  Encounter Diagnoses  Name Primary?  . Biliary cirrhosis   . Esophageal reflux   . Other general symptoms    . NASH (nonalcoholic steatohepatitis)   . Cirrhosis of liver not due to alcohol      Please copy her primary care physician, referring physician, and pertinent subspecialists.

## 2011-05-17 NOTE — Patient Instructions (Signed)
Please go to the basement today for your labs.  Your prescription(s) have been sent to you pharmacy.   

## 2011-05-18 ENCOUNTER — Ambulatory Visit (INDEPENDENT_AMBULATORY_CARE_PROVIDER_SITE_OTHER): Payer: Medicare Other | Admitting: Gastroenterology

## 2011-05-18 ENCOUNTER — Telehealth: Payer: Self-pay | Admitting: *Deleted

## 2011-05-18 ENCOUNTER — Other Ambulatory Visit: Payer: Self-pay | Admitting: Gastroenterology

## 2011-05-18 DIAGNOSIS — E538 Deficiency of other specified B group vitamins: Secondary | ICD-10-CM

## 2011-05-18 MED ORDER — CYANOCOBALAMIN 1000 MCG/ML IJ SOLN
1000.0000 ug | INTRAMUSCULAR | Status: AC
  Administered 2011-05-18 – 2011-06-01 (×3): 1000 ug via INTRAMUSCULAR

## 2011-05-18 MED ORDER — CYANOCOBALAMIN 1000 MCG/ML IJ SOLN: 1000.0000 ug | INTRAMUSCULAR | Status: DC

## 2011-05-18 MED ORDER — FERREX 150 FORTE PLUS 50-100 MG PO CAPS: ORAL_CAPSULE | ORAL | Status: DC

## 2011-05-18 NOTE — Telephone Encounter (Signed)
Pt aware he will start b12 today.

## 2011-05-18 NOTE — Telephone Encounter (Signed)
Message copied by Florene Glen on Wed May 18, 2011  2:26 PM ------      Message from: Jarold Motto, DAVID R      Created: Wed May 18, 2011  1:42 PM       Tandem  Daily for 3 mos and repeat cbc and ferritin.Marland KitchenMarland Kitchen

## 2011-05-18 NOTE — Telephone Encounter (Signed)
Message copied by Leonette Monarch on Wed May 18, 2011  8:14 AM ------      Message from: Jarold Motto, DAVID R      Created: Tue May 17, 2011  4:30 PM       Needs B12 shots and replacement therapy.

## 2011-05-18 NOTE — Telephone Encounter (Signed)
Notified pt per Dr Jarold Motto, he needs to take Tandem daily x 3 months and then repeat labs. We will contact pt when his labs are due. Pt and wife stated understanding.

## 2011-05-20 ENCOUNTER — Other Ambulatory Visit: Payer: Medicare Other

## 2011-05-20 ENCOUNTER — Other Ambulatory Visit: Payer: Self-pay | Admitting: Gastroenterology

## 2011-05-20 DIAGNOSIS — Z1211 Encounter for screening for malignant neoplasm of colon: Secondary | ICD-10-CM

## 2011-05-23 ENCOUNTER — Telehealth: Payer: Self-pay | Admitting: *Deleted

## 2011-05-23 NOTE — Telephone Encounter (Signed)
Message copied by Leonette Monarch on Mon May 23, 2011  1:47 PM ------      Message from: Jarold Motto, DAVID R      Created: Mon May 23, 2011  8:32 AM       Needs colon exam weds with pro

## 2011-05-23 NOTE — Telephone Encounter (Signed)
Pt scheduled for 06/01/2011 for colonoscopy and 05/24/2011 for previsit.

## 2011-05-24 ENCOUNTER — Ambulatory Visit (INDEPENDENT_AMBULATORY_CARE_PROVIDER_SITE_OTHER): Payer: Medicare Other | Admitting: Gastroenterology

## 2011-05-24 ENCOUNTER — Ambulatory Visit (AMBULATORY_SURGERY_CENTER): Payer: Medicare Other | Admitting: *Deleted

## 2011-05-24 VITALS — Ht 67.0 in | Wt 163.0 lb

## 2011-05-24 DIAGNOSIS — Z1211 Encounter for screening for malignant neoplasm of colon: Secondary | ICD-10-CM

## 2011-05-24 DIAGNOSIS — E538 Deficiency of other specified B group vitamins: Secondary | ICD-10-CM

## 2011-05-24 MED ORDER — PEG-KCL-NACL-NASULF-NA ASC-C 100 G PO SOLR: ORAL | Status: DC

## 2011-05-25 ENCOUNTER — Other Ambulatory Visit: Payer: Self-pay | Admitting: Gastroenterology

## 2011-05-26 ENCOUNTER — Other Ambulatory Visit: Payer: Self-pay | Admitting: *Deleted

## 2011-05-26 NOTE — Telephone Encounter (Signed)
Called [facility] pt has 11 refills on his medication that were sent to the pharmacy on 05/11/2011

## 2011-05-31 ENCOUNTER — Telehealth: Payer: Self-pay | Admitting: Gastroenterology

## 2011-05-31 ENCOUNTER — Encounter: Payer: Self-pay | Admitting: *Deleted

## 2011-05-31 NOTE — Telephone Encounter (Signed)
Spoke with pts wife.  Pt had received the generic of moviprep when he picked it up at the pharmacy.  Wife had questions whether this would be okay and how to drink it.  Went over instructions with her over the phone on how to drink the peg solution.  Also advised her to purchase the dulcolax to go along with the prep.  She verbalized understanding and knows to call back with any more questions.

## 2011-06-01 ENCOUNTER — Ambulatory Visit: Payer: Medicare Other | Admitting: Gastroenterology

## 2011-06-01 ENCOUNTER — Encounter: Payer: Self-pay | Admitting: Gastroenterology

## 2011-06-01 ENCOUNTER — Ambulatory Visit (AMBULATORY_SURGERY_CENTER): Payer: Medicare Other | Admitting: Gastroenterology

## 2011-06-01 VITALS — BP 145/78 | HR 78 | Temp 97.2°F | Resp 19 | Ht 67.0 in | Wt 163.0 lb

## 2011-06-01 DIAGNOSIS — D126 Benign neoplasm of colon, unspecified: Secondary | ICD-10-CM

## 2011-06-01 DIAGNOSIS — K648 Other hemorrhoids: Secondary | ICD-10-CM

## 2011-06-01 DIAGNOSIS — E538 Deficiency of other specified B group vitamins: Secondary | ICD-10-CM

## 2011-06-01 DIAGNOSIS — Z860100 Personal history of colon polyps, unspecified: Secondary | ICD-10-CM

## 2011-06-01 DIAGNOSIS — K5730 Diverticulosis of large intestine without perforation or abscess without bleeding: Secondary | ICD-10-CM

## 2011-06-01 DIAGNOSIS — E119 Type 2 diabetes mellitus without complications: Secondary | ICD-10-CM | POA: Insufficient documentation

## 2011-06-01 DIAGNOSIS — Z1211 Encounter for screening for malignant neoplasm of colon: Secondary | ICD-10-CM

## 2011-06-01 MED ORDER — SODIUM CHLORIDE 0.9 % IV SOLN: 500.0000 mL | INTRAVENOUS | Status: DC

## 2011-06-01 NOTE — Patient Instructions (Signed)
Follow discharge instructions.  Continue your medications.  High Fiber diet, with increased fluid intake.  Await pathology results.

## 2011-06-02 ENCOUNTER — Telehealth: Payer: Self-pay

## 2011-06-02 NOTE — Telephone Encounter (Signed)

## 2011-06-02 NOTE — Telephone Encounter (Deleted)

## 2011-06-07 ENCOUNTER — Encounter: Payer: Self-pay | Admitting: Gastroenterology

## 2011-06-08 ENCOUNTER — Ambulatory Visit (INDEPENDENT_AMBULATORY_CARE_PROVIDER_SITE_OTHER): Payer: Medicare Other | Admitting: Gastroenterology

## 2011-06-08 DIAGNOSIS — E538 Deficiency of other specified B group vitamins: Secondary | ICD-10-CM

## 2011-06-08 MED ORDER — CYANOCOBALAMIN 1000 MCG/ML IJ SOLN: 1000.0000 ug | INTRAMUSCULAR | Status: DC

## 2011-06-08 MED ORDER — CYANOCOBALAMIN 1000 MCG/ML IJ SOLN
1000.0000 ug | Freq: Once | INTRAMUSCULAR | Status: AC
  Administered 2011-06-08: 1000 ug via INTRAMUSCULAR

## 2011-06-17 ENCOUNTER — Ambulatory Visit: Payer: Medicare Other | Admitting: Cardiovascular Disease

## 2011-07-15 ENCOUNTER — Other Ambulatory Visit: Payer: Self-pay | Admitting: Gastroenterology

## 2011-07-15 NOTE — Telephone Encounter (Signed)
Called pharm and they will give syringes

## 2011-07-22 LAB — I-STAT 8, (EC8 V) (CONVERTED LAB)
BUN: 32 — ABNORMAL HIGH
Chloride: 103
Glucose, Bld: 128 — ABNORMAL HIGH
Potassium: 4.4
Sodium: 136
TCO2: 28

## 2011-07-22 LAB — URINALYSIS, ROUTINE W REFLEX MICROSCOPIC
Ketones, ur: NEGATIVE
Nitrite: NEGATIVE
pH: 5.5

## 2011-07-22 LAB — POCT I-STAT CREATININE: Operator id: 146091

## 2011-08-16 ENCOUNTER — Telehealth: Payer: Self-pay

## 2011-08-16 DIAGNOSIS — K7460 Unspecified cirrhosis of liver: Secondary | ICD-10-CM

## 2011-08-16 NOTE — Telephone Encounter (Signed)
Left message on machine to call back  

## 2011-08-16 NOTE — Telephone Encounter (Signed)
Message copied by Donata Duff on Tue Aug 16, 2011 11:20 AM ------      Message from: Donata Duff      Created: Mon Aug 15, 2011  8:01 AM                   ----- Message -----         From: Harlow Mares, CMA         Sent: 08/15/2011           To: Harlow Mares, CMA                        ----- Message -----         From: Harlow Mares, CMA         Sent: 05/18/2011   2:35 PM           To: Harlow Mares, CMA            Needs repeat cbc and ferritin see labs from 05/17/2011

## 2011-08-18 NOTE — Telephone Encounter (Signed)
Pt to come this week for the labs

## 2011-08-19 ENCOUNTER — Other Ambulatory Visit (INDEPENDENT_AMBULATORY_CARE_PROVIDER_SITE_OTHER): Payer: Medicare Other

## 2011-08-19 DIAGNOSIS — K7460 Unspecified cirrhosis of liver: Secondary | ICD-10-CM

## 2011-08-19 LAB — CBC WITH DIFFERENTIAL/PLATELET
Basophils Absolute: 0 10*3/uL (ref 0.0–0.1)
Basophils Relative: 0.3 % (ref 0.0–3.0)
Eosinophils Absolute: 0.1 10*3/uL (ref 0.0–0.7)
Eosinophils Relative: 1.2 % (ref 0.0–5.0)
HCT: 34 % — ABNORMAL LOW (ref 39.0–52.0)
Hemoglobin: 11.5 g/dL — ABNORMAL LOW (ref 13.0–17.0)
Lymphocytes Relative: 20 % (ref 12.0–46.0)
Lymphs Abs: 1.3 10*3/uL (ref 0.7–4.0)
MCHC: 33.9 g/dL (ref 30.0–36.0)
MCV: 94.9 fl (ref 78.0–100.0)
Monocytes Absolute: 0.4 10*3/uL (ref 0.1–1.0)
Monocytes Relative: 6.7 % (ref 3.0–12.0)
Neutro Abs: 4.8 10*3/uL (ref 1.4–7.7)
Neutrophils Relative %: 71.8 % (ref 43.0–77.0)
Platelets: 134 10*3/uL — ABNORMAL LOW (ref 150.0–400.0)
RBC: 3.58 Mil/uL — ABNORMAL LOW (ref 4.22–5.81)
RDW: 14.7 % — ABNORMAL HIGH (ref 11.5–14.6)
WBC: 6.6 10*3/uL (ref 4.5–10.5)

## 2011-08-19 LAB — FERRITIN: Ferritin: 48.7 ng/mL (ref 22.0–322.0)

## 2011-09-12 ENCOUNTER — Telehealth: Payer: Self-pay | Admitting: Gastroenterology

## 2011-09-13 NOTE — Telephone Encounter (Signed)
Needs OV.  

## 2011-09-13 NOTE — Telephone Encounter (Signed)
Notified pt he needs a f/u appt- 09/29/11 at 09:45am; pt stated understanding.

## 2011-09-13 NOTE — Telephone Encounter (Signed)
Pt with Heme + stools had Colon on 06/01/11 with - path on polyps. He was ordered B12 injections and FEREX and repeat CBC and Ferritin on 08/19/11. Ferritin rose from 21.3 to 48.7. Pt will do B12 IM monthly; does he still need to tak the [facility]? Thanks.

## 2011-09-13 NOTE — Telephone Encounter (Signed)
lmom for pt to call back

## 2011-09-26 ENCOUNTER — Other Ambulatory Visit: Payer: Self-pay | Admitting: *Deleted

## 2011-09-26 ENCOUNTER — Encounter: Payer: Self-pay | Admitting: *Deleted

## 2011-09-29 ENCOUNTER — Encounter: Payer: Self-pay | Admitting: Gastroenterology

## 2011-09-29 ENCOUNTER — Ambulatory Visit (INDEPENDENT_AMBULATORY_CARE_PROVIDER_SITE_OTHER): Payer: Medicare Other | Admitting: Gastroenterology

## 2011-09-29 ENCOUNTER — Other Ambulatory Visit (INDEPENDENT_AMBULATORY_CARE_PROVIDER_SITE_OTHER): Payer: Medicare Other

## 2011-09-29 DIAGNOSIS — K745 Biliary cirrhosis, unspecified: Secondary | ICD-10-CM

## 2011-09-29 DIAGNOSIS — J4489 Other specified chronic obstructive pulmonary disease: Secondary | ICD-10-CM

## 2011-09-29 DIAGNOSIS — Z8719 Personal history of other diseases of the digestive system: Secondary | ICD-10-CM

## 2011-09-29 DIAGNOSIS — J449 Chronic obstructive pulmonary disease, unspecified: Secondary | ICD-10-CM

## 2011-09-29 DIAGNOSIS — E538 Deficiency of other specified B group vitamins: Secondary | ICD-10-CM

## 2011-09-29 DIAGNOSIS — IMO0001 Reserved for inherently not codable concepts without codable children: Secondary | ICD-10-CM

## 2011-09-29 LAB — CBC WITH DIFFERENTIAL/PLATELET
Basophils Relative: 0.6 % (ref 0.0–3.0)
Eosinophils Relative: 1.5 % (ref 0.0–5.0)
Lymphocytes Relative: 22 % (ref 12.0–46.0)
Neutrophils Relative %: 69.2 % (ref 43.0–77.0)
RBC: 3.74 Mil/uL — ABNORMAL LOW (ref 4.22–5.81)
WBC: 5.8 10*3/uL (ref 4.5–10.5)

## 2011-09-29 LAB — IBC PANEL
Saturation Ratios: 39.9 % (ref 20.0–50.0)
Transferrin: 236.4 mg/dL (ref 212.0–360.0)

## 2011-09-29 LAB — VITAMIN B12: Vitamin B-12: 511 pg/mL (ref 211–911)

## 2011-09-29 LAB — FERRITIN: Ferritin: 56.7 ng/mL (ref 22.0–322.0)

## 2011-09-29 LAB — FOLATE: Folate: 19 ng/mL (ref >5.9)

## 2011-09-29 NOTE — Progress Notes (Signed)
This is a 77 year old Caucasian male with chronic COPD and hypertensive cardiovascular disease. GI-wise, he has a history of recurrent peptic ulcer disease, bleeding ulcerations, and GERD all well controlled with Protonix 40 mg a day. He also has had a mixed anemia with iron deficiency and B12 deficiency. He currently is on replacement therapy for these vitamins. He denies any GI complaints at this time. Currently being evaluated by cardiology, and apparently has a scheduled angiogram. The patient is up-to-date on his endoscopy and colonoscopy procedures. Appetite is good and his weight is stable. He specifically denies dysphagia, anorexia, weight loss, or any hepatobiliary complaints.  Current Medications, Allergies, Past Medical History, Past Surgical History, Family History and Social History were reviewed in [employer] record.  Pertinent Review of Systems Negative   Physical Exam: Elderly appearing patient in no acute distress. His chest shows scattered wheezes but otherwise is unremarkable. He appears to be in a regular rhythm without murmurs gallops or rubs. There is no hepatosplenomegaly, abdominal masses or tenderness. Bowel sounds are normal. Mental status is normal. There are no stigmata of chronic liver disease noted     Assessment and Plan: This patient has stable primary biliary cirrhosis and is on Actigall 250 mg twice a day. He currently is asymptomatic in terms of any specific GI issues, and I have asked him to continue his PPI therapy. We will repeat his labs for his chronic anemia. I've advised him to continue cardiac evaluation by Dr. Melburn Popper as scheduled. He is to continue GI followup in the future as needed Encounter Diagnoses  Name Primary?  . Biliary cirrhosis   . Vitamin B12 deficiency   . COPD with bronchitis   . History of gastroesophageal reflux (GERD)

## 2011-09-29 NOTE — Patient Instructions (Signed)
Continue all medications. Please go to the basement today for your labs.

## 2011-09-30 ENCOUNTER — Telehealth: Payer: Self-pay | Admitting: *Deleted

## 2011-09-30 NOTE — Telephone Encounter (Signed)
Pt aware labs are good, continue medications.

## 2011-09-30 NOTE — Telephone Encounter (Signed)
Message copied by Leonette Monarch on Fri Sep 30, 2011  9:40 AM ------      Message from: Jarold Motto, DAVID R      Created: Fri Sep 30, 2011  8:42 AM       Labs are great.The current medical regimen is effective;  continue present plan and medications.

## 2011-11-01 ENCOUNTER — Other Ambulatory Visit: Payer: Self-pay | Admitting: Gastroenterology

## 2011-11-03 ENCOUNTER — Other Ambulatory Visit: Payer: Self-pay | Admitting: Gastroenterology

## 2011-11-22 ENCOUNTER — Ambulatory Visit (INDEPENDENT_AMBULATORY_CARE_PROVIDER_SITE_OTHER): Payer: Medicare Other | Admitting: Emergency Medicine

## 2011-11-22 DIAGNOSIS — I2510 Atherosclerotic heart disease of native coronary artery without angina pectoris: Secondary | ICD-10-CM

## 2011-11-22 DIAGNOSIS — I10 Essential (primary) hypertension: Secondary | ICD-10-CM

## 2011-11-22 DIAGNOSIS — J449 Chronic obstructive pulmonary disease, unspecified: Secondary | ICD-10-CM

## 2011-11-22 DIAGNOSIS — E782 Mixed hyperlipidemia: Secondary | ICD-10-CM

## 2011-12-20 ENCOUNTER — Telehealth: Payer: Self-pay | Admitting: Cardiovascular Disease

## 2011-12-20 NOTE — Telephone Encounter (Signed)
Pt would like to set up cath

## 2011-12-20 NOTE — Telephone Encounter (Signed)
Spoke with Cory Lawrence wife, it was recommended in June of last year that Cory Lawrence have a cath. His family situation is now better and they are ready to schedule cath. The Cory Lawrence is having no new symptoms. Dr Elease Hashimoto first available in march. Wife does not wish to wait that long. The Cory Lawrence will see lori gerhardt np

## 2011-12-23 ENCOUNTER — Other Ambulatory Visit: Payer: Self-pay | Admitting: Nurse Practitioner

## 2011-12-23 ENCOUNTER — Encounter: Payer: Self-pay | Admitting: Nurse Practitioner

## 2011-12-23 ENCOUNTER — Ambulatory Visit (INDEPENDENT_AMBULATORY_CARE_PROVIDER_SITE_OTHER): Payer: Medicare Other | Admitting: Nurse Practitioner

## 2011-12-23 ENCOUNTER — Ambulatory Visit
Admission: RE | Admit: 2011-12-23 | Discharge: 2011-12-23 | Disposition: A | Discharge status: Home / Self Care | Payer: Medicare Other | Source: Ambulatory Visit | Attending: Nurse Practitioner | Admitting: Nurse Practitioner

## 2011-12-23 VITALS — BP 138/60 | HR 64 | Ht 65.0 in | Wt 161.6 lb

## 2011-12-23 DIAGNOSIS — Z01818 Encounter for other preprocedural examination: Secondary | ICD-10-CM

## 2011-12-23 DIAGNOSIS — I2510 Atherosclerotic heart disease of native coronary artery without angina pectoris: Secondary | ICD-10-CM

## 2011-12-23 NOTE — Assessment & Plan Note (Signed)
Patient now presents for cardiac cath due to abnormal stress testing. He is asymptomatic. Will schedule JV cath for March the 7th with Dr. Elease Hashimoto. He will need pre op labs and CXR. The procedure, risks, and benefits were reviewed and he is willing to proceed. Patient is agreeable to this plan and will call if any problems develop in the interim.

## 2011-12-23 NOTE — Progress Notes (Signed)
Cory Lawrence Date of Birth: 1925-04-25 Medical Record #[id number]  History of Present Illness: Cory Lawrence is seen back today for a pre cath visit. He is seen for Dr. Elease Hashimoto. He is a pleasant 78 year old male. He has diabetes and HTN. No known CAD. Has had abnormal calcium scoring on CT and then had abnormal myoview. This revealed a fixed inferior wall defect consistent with prior inferior wall MI. He has never had sympoms. Cath was recommended but he had to post pone due to family issues.  He comes in today. He is ready to schedule his cath. He remains asymptomatic. Denies chest pain or shortness of breath. Not lightheaded or dizzy. Tolerating his medicines.   Current Outpatient Prescriptions on File Prior to Visit  Medication Sig Dispense Refill  . ALPRAZolam (XANAX) 0.5 MG tablet Take 1 tablet by mouth at bedtime as needed.       Marland Kitchen aspirin 81 MG tablet Take 81 mg by mouth daily.        . cyanocobalamin (,VITAMIN B-12,) 1000 MCG/ML injection Inject 1 mL (1,000 mcg total) into the muscle every 30 (thirty) days.  10 mL  1  . doxazosin (CARDURA) 4 MG tablet Take 4 mg by mouth at bedtime.        . Fluticasone-Salmeterol (ADVAIR DISKUS) 250-50 MCG/DOSE AEPB Inhale 1 puff into the lungs as needed.       Marland Kitchen guaifenesin (TUSSIN CHEST CONGESTION) 100 MG/5ML syrup Take 200 mg by mouth 3 (three) times daily as needed. cough       . Ipratropium-Albuterol (COMBIVENT IN) Inhale into the lungs as needed.       . Melatonin 3 MG CAPS Take by mouth as needed.        . Multiple Vitamin (MULTIVITAMIN) tablet Take 1 tablet by mouth daily.        . Omega-3 Fatty Acids (FISH OIL) 1000 MG CAPS Take by mouth daily.        . pantoprazole (PROTONIX) 40 MG tablet TAKE 1 TABLET BY MOUTH EVERY DAY  34 tablet  10  . simvastatin (ZOCOR) 40 MG tablet Take 40 mg by mouth at bedtime.        . triamcinolone (KENALOG) 0.1 % cream Apply 1 application topically daily.       Marland Kitchen triamterene-hydrochlorothiazide (MAXZIDE-25)  37.5-25 MG per tablet Take 1 tablet by mouth daily.        . ursodiol (ACTIGALL) 250 MG tablet Take 1 tablet (250 mg total) by mouth 2 (two) times daily.  60 tablet  11  . vitamin E 400 UNIT capsule Take 400 Units by mouth daily.         Current Facility-Administered Medications on File Prior to Visit  Medication Dose Route Frequency Provider Last Rate Last Dose  . 0.9 %  sodium chloride infusion  500 mL Intravenous Continuous Sheryn Bison, MD        Allergies  Allergen Reactions  . Penicillins Hives    Past Medical History  Diagnosis Date  . Dizziness   . Diaphoresis   . Palpitations   . COPD (chronic obstructive pulmonary disease)   . Hypertension   . Hyperlipidemia   . GERD (gastroesophageal reflux disease)   . BPH (benign prostatic hypertrophy)   . Personal history of colonic polyps 07/08/2002,06/2011    tubular adenoma, hyperplastic  . Internal hemorrhoids without mention of complication   . Diverticulosis of colon (without mention of hemorrhage)   . Biliary cirrhosis   .  Unspecified gastritis and gastroduodenitis without mention of hemorrhage   . NASH (nonalcoholic steatohepatitis)   . Anxiety   . Arthritis   . Ulcer     Gastric/bleeding  . Diabetes mellitus type 2, diet-controlled     managed with diet  . Vitamin B12 deficiency     Past Surgical History  Procedure Date  . Cataract extraction     bilateral  . Eye surgery     left eye x multiple    History  Smoking status  . Former Smoker  Smokeless tobacco  . Former Neurosurgeon  . Quit date: 05/31/1996    History  Alcohol Use No    Family History  Problem Relation Age of Onset  . Heart failure Mother   . Diabetes Mother   . Diabetes Sister   . Heart failure Sister   . Colon cancer Neg Hx   . Cancer Mother     unknown type    Review of Systems: The review of systems is positive for arthritis of knee.  All other systems were reviewed and are negative.  Physical Exam: BP 138/60  Pulse 64  Ht 5'  5" (1.651 m)  Wt 161 lb 9.6 oz (73.301 kg)  BMI 26.89 kg/m2 Patient is very pleasant and in no acute distress. He is short and overweight. Skin is warm and dry. Color is normal.  HEENT is unremarkable. Normocephalic/atraumatic. PERRL. Sclera are nonicteric. Neck is supple. No masses. No JVD. Lungs are clear. Cardiac exam shows a regular rate and rhythm. Abdomen is soft. Extremities are without edema. Gait and ROM are intact. No gross neurologic deficits noted.  LABORATORY DATA: PENDING   Assessment / Plan:

## 2011-12-23 NOTE — Patient Instructions (Addendum)
We are going to arrange for a heart catheterization with Dr. Elease Hashimoto. This will be to just look at the arteries to your heart.   You will need to come and get your labs checked on Monday March the 4th. Come anytime between 8:30 am and 2 pm  Go to [facility] to get your chest Xray. You may just walk in.  You are scheduled for an outpatient cardiac catheterization on Thursday, March 7th at with Dr. Elease Hashimoto or associates.  Go the [facility] at [facility] on Thursday at 8:30 am.  Call the Heart & Vascular Center at [phone] if you are unable to make your appointment.  The code to get into the parking garage under the building is 6000. You must have someone available to drive you home. Someone needs to be with you for the first 24 hours after you arrive home. Please wear clothes that are easy to get on and off.  Do not eat or drink after midnight on Wednesday. You may have water only with your medications on the morning of your procedure.   Coronary Angiography Coronary angiography is an X-ray procedure used to look at the arteries in the heart. In this procedure, a dye is injected through a long, hollow tube (catheter). The catheter is about the size of a piece of cooked spaghetti. The catheter injects a dye into an artery in your groin. X-rays are then taken to show if there is a blockage in the arteries of your heart. BEFORE THE PROCEDURE   Let your caregiver know if you have allergies to shellfish or contrast dye. Also let your caregiver know if you have kidney problems or failure.   Do not eat or drink starting from midnight up to the time of the procedure, or as directed.   You may drink enough water to take your medications the morning of the procedure if you were instructed to do so.   You should be at the hospital or outpatient facility where the procedure is to be done 60 minutes prior to the procedure or as directed.  PROCEDURE  You may be  given an IV medication to help you relax before the procedure.   You will be prepared for the procedure by washing and shaving the area where the catheter will be inserted. This is usually done in the groin but may be done in the fold of your arm by your elbow.   A medicine will be given to numb your groin where the catheter will be inserted.   A specially trained doctor will insert the catheter into an artery in your groin. The catheter is guided by using a special type of X-ray (fluoroscopy) to the blood vessel being examined.   A special dye is then injected into the catheter and X-rays are taken. The dye helps to show where any narrowing or blockages are located in the heart arteries.  AFTER THE PROCEDURE   After the procedure you will be kept in bed lying flat for several hours. You will be instructed to not bend or cross your legs.   The groin insertion site will be watched and checked frequently.   The pulse in your feet will be checked frequently.   Additional blood tests, X-rays and an EKG may be done.   You may stay in the hospital overnight for observation.  SEEK IMMEDIATE MEDICAL CARE IF:   You develop chest pain, shortness of breath, feel faint, or pass  out.   There is bleeding, swelling, or drainage from the catheter insertion site.   You develop pain, discoloration, coldness, or severe bruising in the leg or area where the catheter was inserted.   You have a fever.  Document Released: 04/23/2003 Document Revised: 06/29/2011 Document Reviewed: 06/11/2008 [facility] Patient Information 2012 [place], [place].

## 2011-12-27 ENCOUNTER — Other Ambulatory Visit: Payer: Self-pay

## 2011-12-27 MED ORDER — TRIAMTERENE-HCTZ 37.5-25 MG PO TABS: 1.0000 | ORAL_TABLET | Freq: Every day | ORAL | Status: DC

## 2011-12-30 HISTORY — PX: CARDIAC CATHETERIZATION: SHX172

## 2012-01-02 ENCOUNTER — Telehealth: Payer: Self-pay | Admitting: *Deleted

## 2012-01-02 ENCOUNTER — Telehealth: Payer: Self-pay | Admitting: Cardiovascular Disease

## 2012-01-02 ENCOUNTER — Other Ambulatory Visit (INDEPENDENT_AMBULATORY_CARE_PROVIDER_SITE_OTHER): Payer: Medicare Other

## 2012-01-02 DIAGNOSIS — R079 Chest pain, unspecified: Secondary | ICD-10-CM

## 2012-01-02 DIAGNOSIS — N289 Disorder of kidney and ureter, unspecified: Secondary | ICD-10-CM

## 2012-01-02 DIAGNOSIS — I2510 Atherosclerotic heart disease of native coronary artery without angina pectoris: Secondary | ICD-10-CM

## 2012-01-02 LAB — BASIC METABOLIC PANEL
BUN: 31 mg/dL — ABNORMAL HIGH (ref 6–23)
CO2: 25 mEq/L (ref 19–32)
Calcium: 8.5 mg/dL (ref 8.4–10.5)
Chloride: 104 mEq/L (ref 96–112)
Creatinine, Ser: 1.6 mg/dL — ABNORMAL HIGH (ref 0.4–1.5)
GFR: 44.55 mL/min — ABNORMAL LOW (ref >60.00)
Glucose, Bld: 98 mg/dL (ref 70–99)
Potassium: 3.8 mEq/L (ref 3.5–5.1)
Sodium: 138 mEq/L (ref 135–145)

## 2012-01-02 LAB — PROTIME-INR
INR: 1.1 ratio — ABNORMAL HIGH (ref 0.8–1.0)
Prothrombin Time: 11.9 s (ref 10.2–12.4)

## 2012-01-02 LAB — CBC WITH DIFFERENTIAL/PLATELET
Basophils Absolute: 0 10*3/uL (ref 0.0–0.1)
Basophils Relative: 0.2 % (ref 0.0–3.0)
Eosinophils Absolute: 0.1 10*3/uL (ref 0.0–0.7)
Eosinophils Relative: 1.5 % (ref 0.0–5.0)
HCT: 36.4 % — ABNORMAL LOW (ref 39.0–52.0)
Hemoglobin: 12.3 g/dL — ABNORMAL LOW (ref 13.0–17.0)
Lymphocytes Relative: 21.2 % (ref 12.0–46.0)
Lymphs Abs: 1.4 10*3/uL (ref 0.7–4.0)
MCHC: 33.7 g/dL (ref 30.0–36.0)
MCV: 96.5 fl (ref 78.0–100.0)
Monocytes Absolute: 0.4 10*3/uL (ref 0.1–1.0)
Monocytes Relative: 5.8 % (ref 3.0–12.0)
Neutro Abs: 4.7 10*3/uL (ref 1.4–7.7)
Neutrophils Relative %: 71.3 % (ref 43.0–77.0)
Platelets: 140 10*3/uL — ABNORMAL LOW (ref 150.0–400.0)
RBC: 3.77 Mil/uL — ABNORMAL LOW (ref 4.22–5.81)
RDW: 13.7 % (ref 11.5–14.6)
WBC: 6.5 10*3/uL (ref 4.5–10.5)

## 2012-01-02 LAB — APTT: aPTT: 29.1 s — ABNORMAL HIGH (ref 21.7–28.8)

## 2012-01-02 NOTE — Telephone Encounter (Signed)
See labs/ pt called msg left.

## 2012-01-02 NOTE — Progress Notes (Signed)
Pt contacted and will stop Maxzide/ bmet ordered and app set. Pt verbalized understanding.

## 2012-01-02 NOTE — Telephone Encounter (Signed)
Pt called

## 2012-01-02 NOTE — Telephone Encounter (Signed)
Message copied by Antony Odea on Mon Jan 02, 2012  4:03 PM ------      Message from: Rosalio Macadamia      Created: Mon Jan 02, 2012  3:52 PM       Please report. Renal function is abnormal. Would cancel cardiac cath for now. Stop Maxzide. Recheck BMET in one week. I will send a message to [facility].

## 2012-01-02 NOTE — Telephone Encounter (Signed)
Fu call °Patient returning your call °

## 2012-01-05 ENCOUNTER — Inpatient Hospital Stay (HOSPITAL_BASED_OUTPATIENT_CLINIC_OR_DEPARTMENT_OTHER): Admit: 2012-01-05 | Payer: BC Managed Care – PPO | Admitting: Cardiovascular Disease

## 2012-01-05 ENCOUNTER — Encounter (HOSPITAL_BASED_OUTPATIENT_CLINIC_OR_DEPARTMENT_OTHER): Payer: Self-pay

## 2012-01-05 SURGERY — JV LEFT HEART CATHETERIZATION WITH CORONARY ANGIOGRAM
Anesthesia: Moderate Sedation

## 2012-01-09 ENCOUNTER — Other Ambulatory Visit (INDEPENDENT_AMBULATORY_CARE_PROVIDER_SITE_OTHER): Payer: Medicare Other

## 2012-01-09 DIAGNOSIS — N289 Disorder of kidney and ureter, unspecified: Secondary | ICD-10-CM

## 2012-01-09 LAB — BASIC METABOLIC PANEL
BUN: 26 mg/dL — ABNORMAL HIGH (ref 6–23)
CO2: 26 mEq/L (ref 19–32)
Calcium: 8.2 mg/dL — ABNORMAL LOW (ref 8.4–10.5)
Chloride: 107 mEq/L (ref 96–112)
Creatinine, Ser: 1.3 mg/dL (ref 0.4–1.5)
GFR: 54.81 mL/min — ABNORMAL LOW (ref >60.00)
Glucose, Bld: 100 mg/dL — ABNORMAL HIGH (ref 70–99)
Potassium: 3.8 mEq/L (ref 3.5–5.1)
Sodium: 138 mEq/L (ref 135–145)

## 2012-01-17 ENCOUNTER — Telehealth: Payer: Self-pay | Admitting: *Deleted

## 2012-01-17 ENCOUNTER — Encounter: Payer: Self-pay | Admitting: *Deleted

## 2012-01-17 DIAGNOSIS — Z01810 Encounter for preprocedural cardiovascular examination: Secondary | ICD-10-CM

## 2012-01-17 DIAGNOSIS — R9439 Abnormal result of other cardiovascular function study: Secondary | ICD-10-CM

## 2012-01-17 DIAGNOSIS — I2510 Atherosclerotic heart disease of native coronary artery without angina pectoris: Secondary | ICD-10-CM

## 2012-01-17 NOTE — Telephone Encounter (Signed)
PT WANTS TO GO AHEAD WITH [facility]/ APP SET, SEE LETTER

## 2012-01-18 ENCOUNTER — Other Ambulatory Visit: Payer: Self-pay | Admitting: Cardiovascular Disease

## 2012-01-18 ENCOUNTER — Other Ambulatory Visit (INDEPENDENT_AMBULATORY_CARE_PROVIDER_SITE_OTHER): Payer: Medicare Other

## 2012-01-18 DIAGNOSIS — Z01810 Encounter for preprocedural cardiovascular examination: Secondary | ICD-10-CM

## 2012-01-18 DIAGNOSIS — I2510 Atherosclerotic heart disease of native coronary artery without angina pectoris: Secondary | ICD-10-CM

## 2012-01-18 DIAGNOSIS — Z01818 Encounter for other preprocedural examination: Secondary | ICD-10-CM

## 2012-01-18 DIAGNOSIS — R9439 Abnormal result of other cardiovascular function study: Secondary | ICD-10-CM

## 2012-01-18 LAB — CBC WITH DIFFERENTIAL/PLATELET
Basophils Relative: 0.5 % (ref 0.0–3.0)
HCT: 34.8 % — ABNORMAL LOW (ref 39.0–52.0)
Hemoglobin: 11.7 g/dL — ABNORMAL LOW (ref 13.0–17.0)
Lymphocytes Relative: 18.7 % (ref 12.0–46.0)
Monocytes Relative: 5.3 % (ref 3.0–12.0)
Neutro Abs: 4.6 10*3/uL (ref 1.4–7.7)
RBC: 3.56 Mil/uL — ABNORMAL LOW (ref 4.22–5.81)

## 2012-01-18 LAB — BASIC METABOLIC PANEL
CO2: 26 mEq/L (ref 19–32)
Calcium: 8.1 mg/dL — ABNORMAL LOW (ref 8.4–10.5)
GFR: 54.34 mL/min — ABNORMAL LOW (ref >60.00)
Potassium: 3.8 mEq/L (ref 3.5–5.1)
Sodium: 138 mEq/L (ref 135–145)

## 2012-01-19 ENCOUNTER — Telehealth: Payer: Self-pay | Admitting: Cardiovascular Disease

## 2012-01-19 NOTE — Telephone Encounter (Signed)
New Msg: Pt wife calling wanting to speak with nurse/MD to make sure that pt is still scheduled to have procedure tomorrow. I assured pt according to our records procedure is still scheduled for tomorrow. However, pt wife wanted to be assured per Dr. Harvie Bridge nurse that pt procedure is still on based on pt recent lab results.  Please return pt wife call to discuss further.

## 2012-01-19 NOTE — Telephone Encounter (Signed)
PT'S WIFE AWARE PT MAY PROCEED WITH CATH TOMORROW PER DR NAHSER./CY

## 2012-01-20 ENCOUNTER — Encounter (HOSPITAL_BASED_OUTPATIENT_CLINIC_OR_DEPARTMENT_OTHER)
Admission: RE | Disposition: A | Discharge status: Home / Self Care | Payer: Self-pay | Source: Ambulatory Visit | Attending: Cardiovascular Disease

## 2012-01-20 ENCOUNTER — Inpatient Hospital Stay (HOSPITAL_BASED_OUTPATIENT_CLINIC_OR_DEPARTMENT_OTHER)
Admission: RE | Admit: 2012-01-20 | Discharge: 2012-01-20 | Disposition: A | Discharge status: Home / Self Care | Payer: Medicare Other | Source: Ambulatory Visit | Attending: Cardiovascular Disease | Admitting: Cardiovascular Disease

## 2012-01-20 DIAGNOSIS — R9439 Abnormal result of other cardiovascular function study: Secondary | ICD-10-CM | POA: Insufficient documentation

## 2012-01-20 DIAGNOSIS — I2510 Atherosclerotic heart disease of native coronary artery without angina pectoris: Secondary | ICD-10-CM | POA: Insufficient documentation

## 2012-01-20 DIAGNOSIS — Z01818 Encounter for other preprocedural examination: Secondary | ICD-10-CM

## 2012-01-20 DIAGNOSIS — I2584 Coronary atherosclerosis due to calcified coronary lesion: Secondary | ICD-10-CM | POA: Insufficient documentation

## 2012-01-20 SURGERY — JV LEFT HEART CATHETERIZATION WITH CORONARY ANGIOGRAM
Anesthesia: Moderate Sedation

## 2012-01-20 MED ORDER — SODIUM CHLORIDE 0.9 % IV SOLN: INTRAVENOUS | Status: DC

## 2012-01-20 MED ORDER — DIAZEPAM 5 MG PO TABS
5.0000 mg | ORAL_TABLET | ORAL | Status: AC
  Administered 2012-01-20: 5 mg via ORAL

## 2012-01-20 MED ORDER — SODIUM CHLORIDE 0.9 % IJ SOLN: 3.0000 mL | Freq: Two times a day (BID) | INTRAMUSCULAR | Status: DC

## 2012-01-20 MED ORDER — ACETAMINOPHEN 325 MG PO TABS: 650.0000 mg | ORAL_TABLET | ORAL | Status: DC | PRN

## 2012-01-20 MED ORDER — SODIUM CHLORIDE 0.9 % IV SOLN
250.0000 mL | INTRAVENOUS | Status: DC | PRN
Start: 2012-01-20 — End: 2012-01-20

## 2012-01-20 MED ORDER — SODIUM CHLORIDE 0.9 % IV SOLN: 250.0000 mL | INTRAVENOUS | Status: DC | PRN

## 2012-01-20 MED ORDER — SODIUM CHLORIDE 0.9 % IJ SOLN: 3.0000 mL | INTRAMUSCULAR | Status: DC | PRN

## 2012-01-20 MED ORDER — ONDANSETRON HCL 4 MG/2ML IJ SOLN: 4.0000 mg | Freq: Four times a day (QID) | INTRAMUSCULAR | Status: DC | PRN

## 2012-01-20 MED ORDER — SODIUM CHLORIDE 0.9 % IV SOLN: INTRAVENOUS | Status: AC

## 2012-01-20 MED ORDER — SODIUM CHLORIDE 0.9 % IV SOLN
1.0000 mL/kg/h | INTRAVENOUS | Status: DC
  Administered 2012-01-20: 1 mL/kg/h via INTRAVENOUS

## 2012-01-20 MED ORDER — ASPIRIN 81 MG PO CHEW: 324.0000 mg | CHEWABLE_TABLET | ORAL | Status: DC

## 2012-01-20 NOTE — H&P (View-Only) (Signed)
 Cory Lawrence Date of Birth: 08/21/1925 Medical Record #[id number]  History of Present Illness: Cory Lawrence is seen back today for a pre cath visit. He is seen for Dr. Nahser. He is a pleasant 78 year old male. He has diabetes and HTN. No known CAD. Has had abnormal calcium scoring on CT and then had abnormal myoview. This revealed a fixed inferior wall defect consistent with prior inferior wall MI. He has never had sympoms. Cath was recommended but he had to post pone due to family issues.  He comes in today. He is ready to schedule his cath. He remains asymptomatic. Denies chest pain or shortness of breath. Not lightheaded or dizzy. Tolerating his medicines.   Current Outpatient Prescriptions on File Prior to Visit  Medication Sig Dispense Refill  . ALPRAZolam (XANAX) 0.5 MG tablet Take 1 tablet by mouth at bedtime as needed.       . aspirin 81 MG tablet Take 81 mg by mouth daily.        . cyanocobalamin (,VITAMIN B-12,) 1000 MCG/ML injection Inject 1 mL (1,000 mcg total) into the muscle every 30 (thirty) days.  10 mL  1  . doxazosin (CARDURA) 4 MG tablet Take 4 mg by mouth at bedtime.        . Fluticasone-Salmeterol (ADVAIR DISKUS) 250-50 MCG/DOSE AEPB Inhale 1 puff into the lungs as needed.       . guaifenesin (TUSSIN CHEST CONGESTION) 100 MG/5ML syrup Take 200 mg by mouth 3 (three) times daily as needed. cough       . Ipratropium-Albuterol (COMBIVENT IN) Inhale into the lungs as needed.       . Melatonin 3 MG CAPS Take by mouth as needed.        . Multiple Vitamin (MULTIVITAMIN) tablet Take 1 tablet by mouth daily.        . Omega-3 Fatty Acids (FISH OIL) 1000 MG CAPS Take by mouth daily.        . pantoprazole (PROTONIX) 40 MG tablet TAKE 1 TABLET BY MOUTH EVERY DAY  34 tablet  10  . simvastatin (ZOCOR) 40 MG tablet Take 40 mg by mouth at bedtime.        . triamcinolone (KENALOG) 0.1 % cream Apply 1 application topically daily.       . triamterene-hydrochlorothiazide (MAXZIDE-25)  37.5-25 MG per tablet Take 1 tablet by mouth daily.        . ursodiol (ACTIGALL) 250 MG tablet Take 1 tablet (250 mg total) by mouth 2 (two) times daily.  60 tablet  11  . vitamin E 400 UNIT capsule Take 400 Units by mouth daily.         Current Facility-Administered Medications on File Prior to Visit  Medication Dose Route Frequency Provider Last Rate Last Dose  . 0.9 %  sodium chloride infusion  500 mL Intravenous Continuous David Patterson, MD        Allergies  Allergen Reactions  . Penicillins Hives    Past Medical History  Diagnosis Date  . Dizziness   . Diaphoresis   . Palpitations   . COPD (chronic obstructive pulmonary disease)   . Hypertension   . Hyperlipidemia   . GERD (gastroesophageal reflux disease)   . BPH (benign prostatic hypertrophy)   . Personal history of colonic polyps 07/08/2002,06/2011    tubular adenoma, hyperplastic  . Internal hemorrhoids without mention of complication   . Diverticulosis of colon (without mention of hemorrhage)   . Biliary cirrhosis   .   Unspecified gastritis and gastroduodenitis without mention of hemorrhage   . NASH (nonalcoholic steatohepatitis)   . Anxiety   . Arthritis   . Ulcer     Gastric/bleeding  . Diabetes mellitus type 2, diet-controlled     managed with diet  . Vitamin B12 deficiency     Past Surgical History  Procedure Date  . Cataract extraction     bilateral  . Eye surgery     left eye x multiple    History  Smoking status  . Former Smoker  Smokeless tobacco  . Former User  . Quit date: 05/31/1996    History  Alcohol Use No    Family History  Problem Relation Age of Onset  . Heart failure Mother   . Diabetes Mother   . Diabetes Sister   . Heart failure Sister   . Colon cancer Neg Hx   . Cancer Mother     unknown type    Review of Systems: The review of systems is positive for arthritis of knee.  All other systems were reviewed and are negative.  Physical Exam: BP 138/60  Pulse 64  Ht 5'  5" (1.651 m)  Wt 161 lb 9.6 oz (73.301 kg)  BMI 26.89 kg/m2 Patient is very pleasant and in no acute distress. He is short and overweight. Skin is warm and dry. Color is normal.  HEENT is unremarkable. Normocephalic/atraumatic. PERRL. Sclera are nonicteric. Neck is supple. No masses. No JVD. Lungs are clear. Cardiac exam shows a regular rate and rhythm. Abdomen is soft. Extremities are without edema. Gait and ROM are intact. No gross neurologic deficits noted.  LABORATORY DATA: PENDING   Assessment / Plan:  

## 2012-01-20 NOTE — Interval H&P Note (Signed)
History and Physical Interval Note:  01/20/2012 10:43 AM  Cory Lawrence  has presented today for surgery, with the diagnosis of chest pain  The various methods of treatment have been discussed with the patient and family. After consideration of risks, benefits and other options for treatment, the patient has consented to  Procedure(s) (LRB): JV LEFT HEART CATHETERIZATION WITH CORONARY ANGIOGRAM (N/A) as a surgical intervention .  The patients' history has been reviewed, patient examined, no change in status, stable for surgery.  I have reviewed the patients' chart and labs.  Questions were answered to the patient's satisfaction.    Pt had an elevated coronary calcium score and a subsequent abnormal myoview study.  Was recommended for cath last fall.  Was delayed and is now finally rescheduled.  [name].

## 2012-01-20 NOTE — Op Note (Signed)
    Cardiac Cath Note  Bedford Winsor [id number] [birth year]  Procedure: Left  Heart Cardiac Catheterization Note Indications: Abnormal Myoview study  Procedure Details Consent: Obtained Time Out: Verified patient identification, verified procedure, site/side was marked, verified correct patient position, special equipment/implants available, Radiology Safety Procedures followed,  medications/allergies/relevent history reviewed, required imaging and test results available.  Performed   Medications: Fentanyl: 25 mcg IV Versed: 1 mg IV  The right femoral artery was easily canulated using a modified Seldinger technique.  Hemodynamics:   LV pressure: 149/20 Aortic pressure: 149/60  Angiography   Left Main: The left main has a mild degree of calcification. There are minor little regular disease.  Left anterior Descending: The left anterior descending artery is moderately. There Are Several Irregularities Which Represent a 40-50% Stenosis but It Does Not Appear to Be Flow Obstructive. The Mid and Distal LAD Have Minor Little Regular Allergies. The First Item Artery Is a Moderate-Sized Vessel with Minor Little Regular to the Second Diagonal Vessel Is a Moderate Size Vessel with a Proximal 40-50% Stenosis. This Vessel Bifurcates into 2 Smaller Branches Both of with Minor Luminal Allergies.  The distal LAD supplies collaterals to the occluded posterior descending artery from the RCA.  Left Circumflex: The circumflex artery is a large vessel. The first acute marginal artery is a moderate-sized vessel and has minor little regular to. The distal circumflex artery has minor little regular disease between 20 and 30%. There is a large posterior lateral branch has minor little regular disease.  Right Coronary Artery: The right coronary artery is moderate to large in size. There is a mid 90% stenosis. The distal RCA has a 70-80% stenosis and is then occluded at the posterior descending artery.  There is no significant flow down to the posterior descending artery. The posterior descending arteries filled by collaterals from the terminal LAD.  LV Gram: The left ventriculogram was performed in the 30 RAO position. It reveals a mildly depressed left ventricular systolic function with ejection fraction of 45-50%. There is inferior wall hypokinesis.  Complications: No apparent complications Patient did tolerate procedure well.  Conclusions:   1. Diffuse coronary artery irregularities. The left main and LAD are moderately calcified. There are moderate stenosis in the LAD but no flow obstructing lesions.  The right coronary artery is occluded distally and the posterior descending artery fills via left right collaterals. This RCA occlusion is the etiology of the inferior wall defect seen on Myoview study.  The PDA is filled very well from there for left anterior descending artery and I do not think that revascularization of the right coronary artery is indicated. There is very poor flow and I do not think that a stent would remain patent. We'll continue with medical therapy as needed.  2. Mildly depressed left ventricle systolic function.  Vesta Mixer, Montez Hageman., MD, [facility] 01/20/2012, 11:16 AM

## 2012-01-20 NOTE — Progress Notes (Signed)
Bedrest begins @ 1130.  Dr. Elease Hashimoto in to discuss results with patient and wife.

## 2012-01-23 NOTE — Progress Notes (Signed)
Discharge instructions completed, ambulated to bathroom without bleeding from right groin site.  Discharged to home via wheelchair with wife. 

## 2012-02-04 ENCOUNTER — Encounter (HOSPITAL_COMMUNITY): Payer: Self-pay | Admitting: Nurse Practitioner

## 2012-02-04 ENCOUNTER — Inpatient Hospital Stay (HOSPITAL_COMMUNITY)
Admission: EM | Admit: 2012-02-04 | Discharge: 2012-02-07 | DRG: 392 | Disposition: A | Discharge status: Home / Self Care | Payer: Medicare Other | Source: Ambulatory Visit | Attending: Family Medicine | Admitting: Family Medicine

## 2012-02-04 ENCOUNTER — Other Ambulatory Visit: Payer: Self-pay

## 2012-02-04 ENCOUNTER — Emergency Department (HOSPITAL_COMMUNITY): Payer: Medicare Other

## 2012-02-04 DIAGNOSIS — F411 Generalized anxiety disorder: Secondary | ICD-10-CM | POA: Diagnosis present

## 2012-02-04 DIAGNOSIS — I4891 Unspecified atrial fibrillation: Secondary | ICD-10-CM | POA: Diagnosis not present

## 2012-02-04 DIAGNOSIS — Z6828 Body mass index (BMI) 28.0-28.9, adult: Secondary | ICD-10-CM

## 2012-02-04 DIAGNOSIS — Z9849 Cataract extraction status, unspecified eye: Secondary | ICD-10-CM

## 2012-02-04 DIAGNOSIS — R6889 Other general symptoms and signs: Secondary | ICD-10-CM

## 2012-02-04 DIAGNOSIS — I2510 Atherosclerotic heart disease of native coronary artery without angina pectoris: Secondary | ICD-10-CM | POA: Diagnosis present

## 2012-02-04 DIAGNOSIS — Z880 Allergy status to penicillin: Secondary | ICD-10-CM

## 2012-02-04 DIAGNOSIS — R079 Chest pain, unspecified: Secondary | ICD-10-CM | POA: Diagnosis present

## 2012-02-04 DIAGNOSIS — Z860100 Personal history of colon polyps, unspecified: Secondary | ICD-10-CM

## 2012-02-04 DIAGNOSIS — R55 Syncope and collapse: Secondary | ICD-10-CM

## 2012-02-04 DIAGNOSIS — J449 Chronic obstructive pulmonary disease, unspecified: Secondary | ICD-10-CM | POA: Insufficient documentation

## 2012-02-04 DIAGNOSIS — Z9861 Coronary angioplasty status: Secondary | ICD-10-CM

## 2012-02-04 DIAGNOSIS — R0789 Other chest pain: Secondary | ICD-10-CM

## 2012-02-04 DIAGNOSIS — D72829 Elevated white blood cell count, unspecified: Secondary | ICD-10-CM | POA: Diagnosis not present

## 2012-02-04 DIAGNOSIS — E785 Hyperlipidemia, unspecified: Secondary | ICD-10-CM

## 2012-02-04 DIAGNOSIS — K7689 Other specified diseases of liver: Secondary | ICD-10-CM | POA: Diagnosis present

## 2012-02-04 DIAGNOSIS — K745 Biliary cirrhosis, unspecified: Secondary | ICD-10-CM

## 2012-02-04 DIAGNOSIS — D126 Benign neoplasm of colon, unspecified: Secondary | ICD-10-CM

## 2012-02-04 DIAGNOSIS — J4489 Other specified chronic obstructive pulmonary disease: Secondary | ICD-10-CM

## 2012-02-04 DIAGNOSIS — D696 Thrombocytopenia, unspecified: Secondary | ICD-10-CM | POA: Diagnosis not present

## 2012-02-04 DIAGNOSIS — Z79899 Other long term (current) drug therapy: Secondary | ICD-10-CM

## 2012-02-04 DIAGNOSIS — R61 Generalized hyperhidrosis: Secondary | ICD-10-CM

## 2012-02-04 DIAGNOSIS — K648 Other hemorrhoids: Secondary | ICD-10-CM

## 2012-02-04 DIAGNOSIS — E538 Deficiency of other specified B group vitamins: Secondary | ICD-10-CM

## 2012-02-04 DIAGNOSIS — IMO0001 Reserved for inherently not codable concepts without codable children: Secondary | ICD-10-CM

## 2012-02-04 DIAGNOSIS — M129 Arthropathy, unspecified: Secondary | ICD-10-CM | POA: Diagnosis present

## 2012-02-04 DIAGNOSIS — I10 Essential (primary) hypertension: Secondary | ICD-10-CM

## 2012-02-04 DIAGNOSIS — D649 Anemia, unspecified: Secondary | ICD-10-CM | POA: Diagnosis not present

## 2012-02-04 DIAGNOSIS — Z7982 Long term (current) use of aspirin: Secondary | ICD-10-CM

## 2012-02-04 DIAGNOSIS — I252 Old myocardial infarction: Secondary | ICD-10-CM

## 2012-02-04 DIAGNOSIS — R002 Palpitations: Secondary | ICD-10-CM

## 2012-02-04 DIAGNOSIS — N400 Enlarged prostate without lower urinary tract symptoms: Secondary | ICD-10-CM

## 2012-02-04 DIAGNOSIS — E669 Obesity, unspecified: Secondary | ICD-10-CM

## 2012-02-04 DIAGNOSIS — K5730 Diverticulosis of large intestine without perforation or abscess without bleeding: Secondary | ICD-10-CM

## 2012-02-04 DIAGNOSIS — K219 Gastro-esophageal reflux disease without esophagitis: Principal | ICD-10-CM | POA: Diagnosis present

## 2012-02-04 DIAGNOSIS — E119 Type 2 diabetes mellitus without complications: Secondary | ICD-10-CM | POA: Diagnosis present

## 2012-02-04 DIAGNOSIS — Z9981 Dependence on supplemental oxygen: Secondary | ICD-10-CM

## 2012-02-04 DIAGNOSIS — R42 Dizziness and giddiness: Secondary | ICD-10-CM

## 2012-02-04 DIAGNOSIS — K7581 Nonalcoholic steatohepatitis (NASH): Secondary | ICD-10-CM | POA: Diagnosis present

## 2012-02-04 DIAGNOSIS — Z87898 Personal history of other specified conditions: Secondary | ICD-10-CM

## 2012-02-04 DIAGNOSIS — Z8719 Personal history of other diseases of the digestive system: Secondary | ICD-10-CM

## 2012-02-04 DIAGNOSIS — J441 Chronic obstructive pulmonary disease with (acute) exacerbation: Secondary | ICD-10-CM | POA: Diagnosis present

## 2012-02-04 DIAGNOSIS — K7460 Unspecified cirrhosis of liver: Secondary | ICD-10-CM

## 2012-02-04 LAB — CBC
HCT: 34.5 % — ABNORMAL LOW (ref 39.0–52.0)
MCV: 94.5 fL (ref 78.0–100.0)
RBC: 3.65 MIL/uL — ABNORMAL LOW (ref 4.22–5.81)
RDW: 13.9 % (ref 11.5–15.5)
WBC: 13.9 10*3/uL — ABNORMAL HIGH (ref 4.0–10.5)

## 2012-02-04 LAB — DIFFERENTIAL
Basophils Absolute: 0 10*3/uL (ref 0.0–0.1)
Eosinophils Relative: 0 % (ref 0–5)
Lymphocytes Relative: 5 % — ABNORMAL LOW (ref 12–46)
Lymphs Abs: 0.7 10*3/uL (ref 0.7–4.0)
Monocytes Absolute: 0.7 10*3/uL (ref 0.1–1.0)
Neutro Abs: 12.5 10*3/uL — ABNORMAL HIGH (ref 1.7–7.7)

## 2012-02-04 LAB — CARDIAC PANEL(CRET KIN+CKTOT+MB+TROPI)
CK, MB: 1.7 ng/mL (ref 0.3–4.0)
Total CK: 72 U/L (ref 7–232)

## 2012-02-04 LAB — PROTIME-INR
INR: 1.32 (ref 0.00–1.49)
Prothrombin Time: 16.6 seconds — ABNORMAL HIGH (ref 11.6–15.2)

## 2012-02-04 LAB — BASIC METABOLIC PANEL
CO2: 25 mEq/L (ref 19–32)
Chloride: 102 mEq/L (ref 96–112)
Creatinine, Ser: 1.25 mg/dL (ref 0.50–1.35)
Glucose, Bld: 107 mg/dL — ABNORMAL HIGH (ref 70–99)
Sodium: 137 mEq/L (ref 135–145)

## 2012-02-04 LAB — TROPONIN I: Troponin I: <0.3 ng/mL (ref <0.30)

## 2012-02-04 MED ORDER — ASPIRIN 81 MG PO CHEW
324.0000 mg | CHEWABLE_TABLET | Freq: Once | ORAL | Status: AC
  Administered 2012-02-04: 324 mg via ORAL
  Filled 2012-02-04: qty 4

## 2012-02-04 MED ORDER — NITROGLYCERIN 0.4 MG SL SUBL
0.4000 mg | SUBLINGUAL_TABLET | SUBLINGUAL | Status: DC | PRN
  Administered 2012-02-04 – 2012-02-05 (×5): 0.4 mg via SUBLINGUAL
  Filled 2012-02-04 (×2): qty 25
  Filled 2012-02-04: qty 75

## 2012-02-04 NOTE — ED Provider Notes (Signed)
History     CSN: [id number]  Arrival date & time 02/04/12  1415   First MD Initiated Contact with Patient 02/04/12 1657      Chief Complaint  Patient presents with  . Chest Pain    (Consider location/radiation/quality/duration/timing/severity/associated sxs/prior treatment) Patient is a 78 y.o. male presenting with chest pain. The history is provided by the patient.  Chest Pain   He had onset this morning at about 7 AM of a midsternal chest pressure without radiation. He is a poor historian and gives conflicting details. It is clear that he had associated nausea and dyspnea but no diaphoresis. He at times described the pressure as severe and at times he described it as only mild. He did get some partial relief when he took some TUMS. He took 81 mg aspirin which did not give him any relief.  Pain was not worse with exertion and it did seem to be better when he laid down. Pain is still present. He did have a cardiac catheterization 2 weeks ago. He does not have nitroglycerin at home. He is a former smoker having quit 20 years ago, but does have COPD.   Past Medical History  Diagnosis Date  . Dizziness   . Diaphoresis   . Palpitations   . COPD (chronic obstructive pulmonary disease)   . Hypertension   . Hyperlipidemia   . GERD (gastroesophageal reflux disease)   . BPH (benign prostatic hypertrophy)   . Personal history of colonic polyps 07/08/2002,06/2011    tubular adenoma, hyperplastic  . Internal hemorrhoids without mention of complication   . Diverticulosis of colon (without mention of hemorrhage)   . Biliary cirrhosis   . Unspecified gastritis and gastroduodenitis without mention of hemorrhage   . NASH (nonalcoholic steatohepatitis)   . Anxiety   . Arthritis   . Ulcer     Gastric/bleeding  . Diabetes mellitus type 2, diet-controlled     managed with diet  . Vitamin B12 deficiency     Past Surgical History  Procedure Date  . Cataract extraction     bilateral  . Eye  surgery     left eye x multiple    Family History  Problem Relation Age of Onset  . Heart failure Mother   . Diabetes Mother   . Diabetes Sister   . Heart failure Sister   . Colon cancer Neg Hx   . Cancer Mother     unknown type    History  Substance Use Topics  . Smoking status: Former Games developer  . Smokeless tobacco: Former Neurosurgeon    Quit date: 05/31/1996  . Alcohol Use: No      Review of Systems  Cardiovascular: Positive for chest pain.  All other systems reviewed and are negative.    Allergies  Penicillins  Home Medications   Current Outpatient Rx  Name Route Sig Dispense Refill  . ALPRAZOLAM 0.5 MG PO TABS Oral Take 1 tablet by mouth at bedtime as needed.     . ASPIRIN 81 MG PO TABS Oral Take 81 mg by mouth daily.      . CYANOCOBALAMIN 1000 MCG/ML IJ SOLN Intramuscular Inject 1 mL (1,000 mcg total) into the muscle every 30 (thirty) days. 10 mL 1    Give appropriate syringes and needles for IM injec ...  . DOXAZOSIN MESYLATE 4 MG PO TABS Oral Take 4 mg by mouth at bedtime.      Marland Kitchen FLUTICASONE-SALMETEROL 250-50 MCG/DOSE IN AEPB Inhalation Inhale 1 puff into  the lungs as needed. Shortness of breath.    . COMBIVENT IN Inhalation Inhale 1 puff into the lungs as needed. Shortness of breath.    . MELATONIN 3 MG PO CAPS Oral Take 3 mg by mouth at bedtime as needed. Sleep     . ONE-DAILY MULTI VITAMINS PO TABS Oral Take 1 tablet by mouth daily.      Marland Kitchen FISH OIL 1000 MG PO CAPS Oral Take 1,000 mg by mouth daily.     Marland Kitchen PANTOPRAZOLE SODIUM 40 MG PO TBEC Oral Take 40 mg by mouth daily with breakfast.    . SIMVASTATIN 40 MG PO TABS Oral Take 40 mg by mouth at bedtime.      . TRIAMCINOLONE ACETONIDE 0.1 % EX CREA Topical Apply 1 application topically daily. Apply to ankles.    Marland Kitchen URSODIOL 250 MG PO TABS Oral Take 1 tablet (250 mg total) by mouth 2 (two) times daily. 60 tablet 11  . VITAMIN E 400 UNITS PO CAPS Oral Take 400 Units by mouth daily.        BP 136/60  Pulse 77   Temp(Src) 98.2 F (36.8 C) (Oral)  Resp 16  Ht 5\' 6"  (1.676 m)  Wt 160 lb (72.576 kg)  BMI 25.82 kg/m2  SpO2 95%  Physical Exam  Nursing note and vitals reviewed.  78 year old male who is resting comfortably and in no acute distress. Vital signs are normal. Oxygen saturation is 95% which is normal. Head is normocephalic and atraumatic. PERRLA, EOMI. Oropharynx is clear. Neck is nontender and supple without adenopathy or JVD. Lungs have distant breath sounds with no overt rales, wheezes, or rhonchi. Heart has regular rate and rhythm without murmur. Abdomen is soft, flat, nontender without masses or hepatosplenomegaly. Extremities have no cyanosis or edema, full range of motion is present. Skin is warm and dry without rash. Neurologic: Mental status is normal, cranial nerves are intact, there are no focal motor or sensory deficits.  ED Course  Procedures (including critical care time)  Labs Reviewed - No data to display No results found.  He got complete relief of his chest discomfort with nitroglycerin.  Case has been discussed with Dr. Mayford Knife of cardiology who has reviewed the catheterization report. With negative cardiac markers, she feels that the patient should be admitted to the general medicine service. Case is discussed with Dr. Adela Glimpse , and evaluate the patient in the emergency department.  1. Chest pain       MDM  Chest pain which is worrisome for cardiac etiology. I reviewed his hospital record and on his catheterization he had a 90% lesion of the right coronary artery which was not stented because it was a low-flow vessel and there was concern that the stent would not stay in place. He clearly has myocardium at risk. Aspects of his symptoms seem more consistent with GERD in that he got relief with TUMS and it was worse with laying down. There was an ECG that had been ordered on arrival to the ED, but I have not seen that ECG so a new one will be obtained. He'll be given full  dose aspirin and a trial of nitroglycerin.      f  Dione Booze, MD 02/04/12 2101

## 2012-02-04 NOTE — ED Notes (Signed)
Pt woke with midsternal CP onset when waking this am, constant since onset. Denies sob, dizziness, nausea

## 2012-02-04 NOTE — ED Notes (Signed)
Patient complains a pressure in his chest. Since this morning around am. He advises that the pain is radiating into his shoulders he had nausea without vomiting, no diaphorsis. Some Shortness of breath, he advises that the pain is mild but he is unable to rate on the pain scale. He had a cardiac cath two weeks ago.

## 2012-02-04 NOTE — H&P (Signed)
PCP:  Lucilla Edin, MD, MD  Cardiology [facility]  Chief Complaint:   Chest pain  HPI: Cory Lawrence is a 78 y.o. male   has a past medical history of Dizziness; Diaphoresis; Palpitations; COPD (chronic obstructive pulmonary disease); Hypertension; Hyperlipidemia; GERD (gastroesophageal reflux disease); BPH (benign prostatic hypertrophy); Personal history of colonic polyps (07/08/2002,06/2011); Internal hemorrhoids without mention of complication; Diverticulosis of colon (without mention of hemorrhage); Biliary cirrhosis; Unspecified gastritis and gastroduodenitis without mention of hemorrhage; NASH (nonalcoholic steatohepatitis); Anxiety; Arthritis; Ulcer; Diabetes mellitus type 2, diet-controlled; and Vitamin B12 deficiency.   Presented with  Chest pain since am felt like congestion and pressure. Like a knot in his chest. Associated with some sensation of wanting to cough, some nausea no vomiting. He took some tums with some improvement but still decided to have this evaluated in ED. Now chest pain free after getting nitro in ED. No history of MI but had cardiac catheterization done in end of march with some CAD (90% lesion of RCA  but medical treatment only based on thought that this is a low flow vessel and also would be difficult to stent). This case was discussed by ED provider with Dr. Mayford Knife who deferred patient for medical admittion and rule out.   Review of Systems:    Pertinent positives include: nausea, congestion,  Constitutional:  No weight loss, night sweats, Fevers, chills, fatigue, weight loss  HEENT:  No headaches, Difficulty swallowing,Tooth/dental problems,Sore throat,  No sneezing, itching, ear ache, nasal  post nasal drip,  Cardio-vascular:  No chest pain, Orthopnea, PND, anasarca, dizziness, palpitations. no Bilateral lower extremity swelling  GI:  No heartburn, indigestion, abdominal pain,vomiting, diarrhea, change in bowel habits, loss of appetite, melena, blood in  stool, hematoemesis Resp:  no shortness of breath at rest. No dyspnea on exertion, No excess mucus, no productive cough, No non-productive cough, No coughing up of blood. No change in color of mucus.No wheezing. Skin:  no rash or lesions. No jaundice GU:  no dysuria, change in color of urine, no urgency or frequency. No straining to urinate.  No flank pain.  Musculoskeletal:  No joint pain or no joint swelling. No decreased range of motion. No back pain.  Psych:  No change in mood or affect. No depression or anxiety. No memory loss.  Neuro: no localizing neurological complaints, no tingling, no weakness, no double vision, no gait abnormality, no slurred speech, no confusion  Otherwise ROS are negative except for above, 10 systems were reviewed  Past Medical History: Past Medical History  Diagnosis Date  . Dizziness   . Diaphoresis   . Palpitations   . COPD (chronic obstructive pulmonary disease)   . Hypertension   . Hyperlipidemia   . GERD (gastroesophageal reflux disease)   . BPH (benign prostatic hypertrophy)   . Personal history of colonic polyps 07/08/2002,06/2011    tubular adenoma, hyperplastic  . Internal hemorrhoids without mention of complication   . Diverticulosis of colon (without mention of hemorrhage)   . Biliary cirrhosis   . Unspecified gastritis and gastroduodenitis without mention of hemorrhage   . NASH (nonalcoholic steatohepatitis)   . Anxiety   . Arthritis   . Ulcer     Gastric/bleeding  . Diabetes mellitus type 2, diet-controlled     managed with diet  . Vitamin B12 deficiency    Past Surgical History  Procedure Date  . Cataract extraction     bilateral  . Eye surgery     left eye x multiple  .  Cardiac catheterization      Medications: Prior to Admission medications   Medication Sig Start Date End Date Taking? Authorizing Provider  ALPRAZolam Prudy Feeler) 0.5 MG tablet Take 1 tablet by mouth at bedtime as needed.  05/22/11  Yes Historical Provider,  MD  aspirin 81 MG tablet Take 81 mg by mouth daily.     Yes Historical Provider, MD  cyanocobalamin (,VITAMIN B-12,) 1000 MCG/ML injection Inject 1 mL (1,000 mcg total) into the muscle every 30 (thirty) days. 06/08/11  Yes Mardella Layman, MD  doxazosin (CARDURA) 4 MG tablet Take 4 mg by mouth at bedtime.     Yes Historical Provider, MD  Fluticasone-Salmeterol (ADVAIR DISKUS) 250-50 MCG/DOSE AEPB Inhale 1 puff into the lungs as needed. Shortness of breath.   Yes Historical Provider, MD  Ipratropium-Albuterol (COMBIVENT IN) Inhale 1 puff into the lungs as needed. Shortness of breath.   Yes Historical Provider, MD  Melatonin 3 MG CAPS Take 3 mg by mouth at bedtime as needed. Sleep    Yes Historical Provider, MD  Multiple Vitamin (MULTIVITAMIN) tablet Take 1 tablet by mouth daily.     Yes Historical Provider, MD  Omega-3 Fatty Acids (FISH OIL) 1000 MG CAPS Take 1,000 mg by mouth daily.    Yes Historical Provider, MD  pantoprazole (PROTONIX) 40 MG tablet Take 40 mg by mouth daily with breakfast.   Yes Historical Provider, MD  simvastatin (ZOCOR) 40 MG tablet Take 40 mg by mouth at bedtime.     Yes Historical Provider, MD  triamcinolone (KENALOG) 0.1 % cream Apply 1 application topically daily. Apply to ankles.   Yes Historical Provider, MD  ursodiol (ACTIGALL) 250 MG tablet Take 1 tablet (250 mg total) by mouth 2 (two) times daily. 05/17/11 05/16/12 Yes Mardella Layman, MD  vitamin E 400 UNIT capsule Take 400 Units by mouth daily.      Historical Provider, MD    Allergies:   Allergies  Allergen Reactions  . Penicillins Hives    Social History:  Ambulatory  independently  Lives at home with wife   reports that he has quit smoking. He quit smokeless tobacco use about 15 years ago. He reports that he does not drink alcohol or use illicit drugs.   Family History: family history includes Cancer in his mother; Diabetes in his mother and sister; and Heart failure in his mother and sister.  There  is no history of Colon cancer.    Physical Exam: Patient Vitals for the past 24 hrs:  BP Temp Temp src Pulse Resp SpO2 Height Weight  02/04/12 1811 126/46 mmHg - - 75  14  100 % - -  02/04/12 1705 144/67 mmHg - - 80  - 97 % - -  02/04/12 1631 136/60 mmHg - - 77  16  95 % - -  02/04/12 1510 - - Oral - - - - -  02/04/12 1421 - - - - - - 5\' 6"  (1.676 m) 72.576 kg (160 lb)  02/04/12 1415 138/54 mmHg 98.2 F (36.8 C) Oral 87  18  95 % - -    1. General:  in No Acute distress 2. Psychological: Alert and Oriented 3. Head/ENT:   Moist Mucous Membranes                          Head Non traumatic, neck supple  Normal Dentition 4. SKIN: normal  Skin turgor,  Skin clean Dry and intact no rash 5. Heart: Regular rate and rhythm systolic ejection Murmur, Rub or gallop 6. Lungs: Clear to auscultation bilaterally, no wheezes or crackles   7. Abdomen: Soft, non-tender, Non distended 8. Lower extremities: no clubbing, cyanosis, or edema, mild bruising at cath site no hematoma 9. Neurologically Grossly intact, moving all 4 extremities equally 10. MSK: Normal range of motion  body mass index is 25.82 kg/(m^2).   Labs on Admission:   [facility] 02/04/12 1708  NA 137  K 3.6  CL 102  CO2 25  GLUCOSE 107*  BUN 25*  CREATININE 1.25  CALCIUM 8.5  MG --  PHOS --   No results found for this basename: AST:2,ALT:2,ALKPHOS:2,BILITOT:2,PROT:2,ALBUMIN:2 in the last 72 hours No results found for this basename: LIPASE:2,AMYLASE:2 in the last 72 hours  Basename 02/04/12 1708  WBC 13.9*  NEUTROABS 12.5*  HGB 11.7*  HCT 34.5*  MCV 94.5  PLT 116*    Basename 02/04/12 1708  CKTOTAL --  CKMB --  CKMBINDEX --  TROPONINI <0.30   No results found for this basename: TSH,T4TOTAL,FREET3,T3FREE,THYROIDAB in the last 72 hours No results found for this basename: VITAMINB12:2,FOLATE:2,FERRITIN:2,TIBC:2,IRON:2,RETICCTPCT:2 in the last 72 hours No results found for this basename:  HGBA1C    Estimated Creatinine Clearance: 39.7 ml/min (by C-G formula based on Cr of 1.25). ABG    Component Value Date/Time   HCO3 26.6* 11/23/2007 1246   TCO2 28 11/23/2007 1246     No results found for this basename: DDIMER     Other results:  I have pearsonaly reviewed this: ECG REPORT  Rate: 90  Rhythm: NSR w PVC ST&T Change: nonspecific changes   Cultures: No results found for this basename: sdes, specrequest, cult, reptstatus       Radiological Exams on Admission: Dg Chest Portable 1 View  02/04/2012  *RADIOLOGY REPORT*  Clinical Data: Chest pain  PORTABLE CHEST - 1 VIEW  Comparison: 12/23/2011  Findings: COPD with hyperinflation of the lungs.  Small bilateral effusions and mild bibasilar atelectasis.  Vascularity normal.  No heart failure or edema is present.  IMPRESSION: Small bilateral pleural effusions and mild bibasilar atelectasis without other signs of heart failure.  Original Report Authenticated By: Camelia Phenes, M.D.    Assessment/Plan   78 yo with history of CAD sp cardiac cath 2 weeks ago here with chest pain that is somewhat atypical  Present on Admission:  .Chest pain - given risk factors will admit, monitor on telemetry, cycle cardiac enzymes, obtain serial ECG.  Make sure patient is on Aspirin. Further treatment based on the currently pending results.  Cardiology consult in am .CAD (coronary artery disease) - continue home meds and aspirin consider adding Imdur for symptom management if this thought to be anginal pain. Recommend calling Cardiology consult in AM. .Diabetes mellitus type 2, diet-controlled - SSI .GERD - protonix .Hypertension - continue home medications .NASH (nonalcoholic steatohepatitis) -stable   Prophylaxis:  Lovenox, Protonix  CODE STATUS: patient is unsure and wants [place] to discuss with further with his wife.   I have spent a total of 60 min on this admition  Cory Lawrence 02/04/2012, 9:44 PM

## 2012-02-05 ENCOUNTER — Other Ambulatory Visit: Payer: Self-pay

## 2012-02-05 ENCOUNTER — Inpatient Hospital Stay (HOSPITAL_COMMUNITY): Payer: Medicare Other

## 2012-02-05 DIAGNOSIS — I2510 Atherosclerotic heart disease of native coronary artery without angina pectoris: Secondary | ICD-10-CM

## 2012-02-05 DIAGNOSIS — R0789 Other chest pain: Secondary | ICD-10-CM

## 2012-02-05 DIAGNOSIS — R079 Chest pain, unspecified: Secondary | ICD-10-CM

## 2012-02-05 DIAGNOSIS — K219 Gastro-esophageal reflux disease without esophagitis: Secondary | ICD-10-CM

## 2012-02-05 LAB — COMPREHENSIVE METABOLIC PANEL
BUN: 27 mg/dL — ABNORMAL HIGH (ref 6–23)
CO2: 26 mEq/L (ref 19–32)
Calcium: 8.1 mg/dL — ABNORMAL LOW (ref 8.4–10.5)
Creatinine, Ser: 1.18 mg/dL (ref 0.50–1.35)
GFR calc Af Amer: 63 mL/min — ABNORMAL LOW (ref >90)
GFR calc non Af Amer: 55 mL/min — ABNORMAL LOW (ref >90)
Glucose, Bld: 94 mg/dL (ref 70–99)

## 2012-02-05 LAB — CARDIAC PANEL(CRET KIN+CKTOT+MB+TROPI)
CK, MB: 1.9 ng/mL (ref 0.3–4.0)
CK, MB: 2.4 ng/mL (ref 0.3–4.0)
Relative Index: INVALID (ref 0.0–2.5)
Total CK: 71 U/L (ref 7–232)
Troponin I: <0.3 ng/mL (ref <0.30)
Troponin I: <0.3 ng/mL (ref <0.30)

## 2012-02-05 LAB — CBC
Hemoglobin: 10.8 g/dL — ABNORMAL LOW (ref 13.0–17.0)
MCHC: 33.3 g/dL (ref 30.0–36.0)
RBC: 3.35 MIL/uL — ABNORMAL LOW (ref 4.22–5.81)
WBC: 10.1 10*3/uL (ref 4.0–10.5)

## 2012-02-05 LAB — LIPID PANEL
Cholesterol: 117 mg/dL (ref 0–200)
VLDL: 12 mg/dL (ref 0–40)

## 2012-02-05 LAB — HEPARIN LEVEL (UNFRACTIONATED): Heparin Unfractionated: 0.1 IU/mL — ABNORMAL LOW (ref 0.30–0.70)

## 2012-02-05 LAB — GLUCOSE, CAPILLARY: Glucose-Capillary: 136 mg/dL — ABNORMAL HIGH (ref 70–99)

## 2012-02-05 LAB — HEMOGLOBIN A1C: Hgb A1c MFr Bld: 5.5 % (ref <5.7)

## 2012-02-05 MED ORDER — MORPHINE SULFATE 2 MG/ML IJ SOLN: 2.0000 mg | INTRAMUSCULAR | Status: DC | PRN

## 2012-02-05 MED ORDER — METOPROLOL TARTRATE 12.5 MG HALF TABLET
12.5000 mg | ORAL_TABLET | Freq: Two times a day (BID) | ORAL | Status: DC
  Administered 2012-02-05 – 2012-02-07 (×5): 12.5 mg via ORAL
  Filled 2012-02-05 (×6): qty 1

## 2012-02-05 MED ORDER — MELATONIN 3 MG PO CAPS: 3.0000 mg | ORAL_CAPSULE | Freq: Every evening | ORAL | Status: DC | PRN

## 2012-02-05 MED ORDER — HEPARIN (PORCINE) IN NACL 100-0.45 UNIT/ML-% IJ SOLN
1300.0000 [IU]/h | INTRAMUSCULAR | Status: DC
  Administered 2012-02-05: 1150 [IU]/h via INTRAVENOUS
  Administered 2012-02-05: 850 [IU]/h via INTRAVENOUS
  Administered 2012-02-06: 1300 [IU]/h via INTRAVENOUS
  Filled 2012-02-05 (×2): qty 250

## 2012-02-05 MED ORDER — ASPIRIN 81 MG PO CHEW
81.0000 mg | CHEWABLE_TABLET | Freq: Every day | ORAL | Status: DC
  Administered 2012-02-05 – 2012-02-07 (×3): 81 mg via ORAL
  Filled 2012-02-05 (×3): qty 1

## 2012-02-05 MED ORDER — ONDANSETRON HCL 4 MG/2ML IJ SOLN: 4.0000 mg | Freq: Four times a day (QID) | INTRAMUSCULAR | Status: DC | PRN

## 2012-02-05 MED ORDER — ACETAMINOPHEN 650 MG RE SUPP: 650.0000 mg | Freq: Four times a day (QID) | RECTAL | Status: DC | PRN

## 2012-02-05 MED ORDER — GUAIFENESIN-DM 100-10 MG/5ML PO SYRP: 5.0000 mL | ORAL_SOLUTION | ORAL | Status: DC | PRN

## 2012-02-05 MED ORDER — ACETAMINOPHEN 325 MG PO TABS: 650.0000 mg | ORAL_TABLET | Freq: Four times a day (QID) | ORAL | Status: DC | PRN

## 2012-02-05 MED ORDER — ALPRAZOLAM 0.5 MG PO TABS: 0.5000 mg | ORAL_TABLET | Freq: Every evening | ORAL | Status: DC | PRN

## 2012-02-05 MED ORDER — ALUM & MAG HYDROXIDE-SIMETH 200-200-20 MG/5ML PO SUSP
30.0000 mL | Freq: Four times a day (QID) | ORAL | Status: DC | PRN
  Administered 2012-02-05: 30 mL via ORAL
  Filled 2012-02-05: qty 30

## 2012-02-05 MED ORDER — DOXAZOSIN MESYLATE 4 MG PO TABS
4.0000 mg | ORAL_TABLET | Freq: Every day | ORAL | Status: DC
  Administered 2012-02-05 – 2012-02-06 (×2): 4 mg via ORAL
  Filled 2012-02-05 (×3): qty 1

## 2012-02-05 MED ORDER — HEPARIN BOLUS VIA INFUSION
2000.0000 [IU] | Freq: Once | INTRAVENOUS | Status: AC
  Administered 2012-02-05: 2000 [IU] via INTRAVENOUS
  Filled 2012-02-05: qty 2000

## 2012-02-05 MED ORDER — SODIUM CHLORIDE 0.9 % IJ SOLN
3.0000 mL | Freq: Two times a day (BID) | INTRAMUSCULAR | Status: DC
  Administered 2012-02-05 (×2): 3 mL via INTRAVENOUS

## 2012-02-05 MED ORDER — ALBUTEROL SULFATE (5 MG/ML) 0.5% IN NEBU: 2.5000 mg | INHALATION_SOLUTION | RESPIRATORY_TRACT | Status: DC | PRN

## 2012-02-05 MED ORDER — ASPIRIN 81 MG PO TABS: 81.0000 mg | ORAL_TABLET | Freq: Every day | ORAL | Status: DC

## 2012-02-05 MED ORDER — BIOTENE DRY MOUTH MT LIQD
15.0000 mL | Freq: Two times a day (BID) | OROMUCOSAL | Status: DC
  Administered 2012-02-05 – 2012-02-07 (×4): 15 mL via OROMUCOSAL

## 2012-02-05 MED ORDER — SODIUM CHLORIDE 0.9 % IJ SOLN: 3.0000 mL | INTRAMUSCULAR | Status: DC | PRN

## 2012-02-05 MED ORDER — SODIUM CHLORIDE 0.9 % IJ SOLN
3.0000 mL | Freq: Two times a day (BID) | INTRAMUSCULAR | Status: DC
  Administered 2012-02-06: 3 mL via INTRAVENOUS

## 2012-02-05 MED ORDER — SIMVASTATIN 40 MG PO TABS
40.0000 mg | ORAL_TABLET | Freq: Every day | ORAL | Status: DC
  Administered 2012-02-05 – 2012-02-06 (×2): 40 mg via ORAL
  Filled 2012-02-05 (×3): qty 1

## 2012-02-05 MED ORDER — INSULIN ASPART 100 UNIT/ML ~~LOC~~ SOLN: 0.0000 [IU] | Freq: Every day | SUBCUTANEOUS | Status: DC

## 2012-02-05 MED ORDER — HYDROCODONE-ACETAMINOPHEN 5-325 MG PO TABS: 1.0000 | ORAL_TABLET | ORAL | Status: DC | PRN

## 2012-02-05 MED ORDER — HEPARIN BOLUS VIA INFUSION
4000.0000 [IU] | Freq: Once | INTRAVENOUS | Status: AC
  Administered 2012-02-05: 4000 [IU] via INTRAVENOUS
  Filled 2012-02-05: qty 4000

## 2012-02-05 MED ORDER — URSODIOL 300 MG PO CAPS
300.0000 mg | ORAL_CAPSULE | Freq: Two times a day (BID) | ORAL | Status: DC
  Administered 2012-02-05 – 2012-02-07 (×5): 300 mg via ORAL
  Filled 2012-02-05 (×7): qty 1

## 2012-02-05 MED ORDER — SODIUM CHLORIDE 0.9 % IV SOLN: 250.0000 mL | INTRAVENOUS | Status: DC | PRN

## 2012-02-05 MED ORDER — PANTOPRAZOLE SODIUM 40 MG PO TBEC: 40.0000 mg | DELAYED_RELEASE_TABLET | Freq: Every day | ORAL | Status: DC

## 2012-02-05 MED ORDER — GI COCKTAIL ~~LOC~~
30.0000 mL | Freq: Three times a day (TID) | ORAL | Status: DC | PRN
  Administered 2012-02-05: 30 mL via ORAL
  Filled 2012-02-05 (×2): qty 30

## 2012-02-05 MED ORDER — DOCUSATE SODIUM 100 MG PO CAPS
100.0000 mg | ORAL_CAPSULE | Freq: Two times a day (BID) | ORAL | Status: DC
  Administered 2012-02-05 – 2012-02-06 (×3): 100 mg via ORAL
  Filled 2012-02-05 (×6): qty 1

## 2012-02-05 MED ORDER — ISOSORBIDE MONONITRATE ER 30 MG PO TB24
30.0000 mg | ORAL_TABLET | Freq: Every day | ORAL | Status: DC
  Administered 2012-02-05 – 2012-02-07 (×3): 30 mg via ORAL
  Filled 2012-02-05 (×3): qty 1

## 2012-02-05 MED ORDER — INSULIN ASPART 100 UNIT/ML ~~LOC~~ SOLN
0.0000 [IU] | Freq: Three times a day (TID) | SUBCUTANEOUS | Status: DC
  Administered 2012-02-05: 2 [IU] via SUBCUTANEOUS

## 2012-02-05 MED ORDER — PANTOPRAZOLE SODIUM 40 MG PO TBEC
40.0000 mg | DELAYED_RELEASE_TABLET | Freq: Every day | ORAL | Status: DC
  Administered 2012-02-05 – 2012-02-07 (×3): 40 mg via ORAL
  Filled 2012-02-05 (×3): qty 1

## 2012-02-05 MED ORDER — ONDANSETRON HCL 4 MG PO TABS: 4.0000 mg | ORAL_TABLET | Freq: Four times a day (QID) | ORAL | Status: DC | PRN

## 2012-02-05 MED ORDER — URSODIOL 250 MG PO TABS: 250.0000 mg | ORAL_TABLET | Freq: Two times a day (BID) | ORAL | Status: DC

## 2012-02-05 MED ORDER — ENOXAPARIN SODIUM 40 MG/0.4ML ~~LOC~~ SOLN
40.0000 mg | SUBCUTANEOUS | Status: DC
  Filled 2012-02-05: qty 0.4

## 2012-02-05 NOTE — Progress Notes (Signed)
Family Medicine Teaching Service Daily Progress Note  Subjective: Patient states he is feeling much better this morning, chest pain improved, does not worsen when he gets up to go to the bathroom.  Objective: Vital signs in last 24 hours: Filed Vitals:   02/04/12 2330 02/05/12 0025 02/05/12 0150 02/05/12 0432  BP: 118/48 128/57 132/60 127/63  Pulse: 60  70 59  Temp:   97.6 F (36.4 C) 98.3 F (36.8 C)  TempSrc:   Oral Oral  Resp: 21 22 18 18   Height:   5\' 3"  (1.6 m)   Weight:   158 lb 11.7 oz (72 kg)   SpO2: 100% 100% 98% 98%   Weight change:   Intake/Output Summary (Last 24 hours) at 02/05/12 1050 Last data filed at 02/05/12 0700  Gross per 24 hour  Intake      0 ml  Output    350 ml  Net   -350 ml   General appearance: alert, cooperative and no distress Head: Normocephalic, without obvious abnormality, atraumatic Eyes: EOMIT Neck: no adenopathy, no carotid bruit, no JVD, supple, symmetrical, trachea midline and thyroid not enlarged, symmetric, no tenderness/mass/nodules Lungs: clear to auscultation bilaterally Heart: regular rate and rhythm, S1, S2 normal, no murmur, click, rub or gallop Abdomen: MIld epigastric tenderness to palpation, +BS, soft, no rebound, guarding or masses Extremities: extremities normal, atraumatic, no cyanosis or edema Pulses: 2+ and symmetric Lab Results:  Basename 02/05/12 0455 02/04/12 1708  WBC 10.1 13.9*  HGB 10.8* 11.7*  HCT 32.4* 34.5*  PLT 112* 116*    Basename 02/05/12 0455 02/04/12 1708  NA 138 137  K 3.5 3.6  CL 103 102  CO2 26 25  GLUCOSE 94 107*  BUN 27* 25*  CREATININE 1.18 1.25  CALCIUM 8.1* 8.5   Micro Results: No results found for this or any previous visit (from the past 240 hour(s)). Studies/Results:  Medications: I have reviewed the patient's current medications. Scheduled Meds:   . antiseptic oral rinse  15 mL Mouth Rinse BID  . aspirin  324 mg Oral Once  . aspirin  81 mg Oral Daily  . docusate sodium   100 mg Oral BID  . doxazosin  4 mg Oral QHS  . heparin  4,000 Units Intravenous Once  . insulin aspart  0-15 Units Subcutaneous TID WC  . insulin aspart  0-5 Units Subcutaneous QHS  . isosorbide mononitrate  30 mg Oral Daily  . metoprolol tartrate  12.5 mg Oral BID  . pantoprazole  40 mg Oral Q1200  . simvastatin  40 mg Oral QHS  . sodium chloride  3 mL Intravenous Q12H  . sodium chloride  3 mL Intravenous Q12H  . ursodiol  300 mg Oral BID  . DISCONTD: aspirin  81 mg Oral Daily  . DISCONTD: enoxaparin  40 mg Subcutaneous Q24H  . DISCONTD: pantoprazole  40 mg Oral Q breakfast  . DISCONTD: ursodiol  250 mg Oral BID   Continuous Infusions:   . heparin 850 Units/hr (02/05/12 0809)   PRN Meds:.sodium chloride, acetaminophen, acetaminophen, albuterol, ALPRAZolam, alum & mag hydroxide-simeth, gi cocktail, guaiFENesin-dextromethorphan, HYDROcodone-acetaminophen, morphine injection, nitroGLYCERIN, ondansetron (ZOFRAN) IV, ondansetron, sodium chloride, DISCONTD: Melatonin Assessment/Plan: Zacarias Krauter is a 78 y.o. male patient with PMHx of CAD, DM, HTN, HLD,, GERD, Hospital day 1 for chest pain, likely cardiac vs. GI related pain-  1) CV: Chest pain- patient with known diffuse  CAD, with distal occlusion of RCA.  He received nitro in ED and was  started on heparin ggt and imdur upon admission, with improvement in symptoms.  Cardiac enzymes have been negative and EKG unchanged.  ECHO is ordered.  [facility] is following, will defer management to them.  HTN- Continue Cardura, lopressor HLD- continue zocor 2) GI- patient with post-prandial nausea, hx of NASH, [place] ordered to eval for etiologies of nausea/epigastric pain. GERD- patient does say he has heart burn pain too, but it has also improved.  He is on protonix and has GI cocktails prn.  3) DM- CBG's have been well controlled on SSI.   LOS: 1 day   English Craighead 02/05/2012, 10:50 AM

## 2012-02-05 NOTE — Progress Notes (Signed)
Monitor tech reported pt converted from A fib to NSR at 4:57 PM. Cory Lawrence, Cory Lawrence

## 2012-02-05 NOTE — Consult Note (Signed)
Patient seen, examined, and I agree with the above documentation, including the assessment and plan. The lower cp is unclear to me.  Certainly it could be angina/cardiac in nature given his known CAD and it seems to have responded to NTG and he remains on hep gtt. Other possibilities include esophageal spasm, esophagitis, GERD.  Less likely pill esophagitis based on history. Abd and GB US is pending.   EGD can be performed, but after ACS has been excluded and heparin infusion is off. Will follow.

## 2012-02-05 NOTE — Progress Notes (Signed)
PHARMACY - ANTICOAGULATION CONSULT Follow-Up NOTE  Pharmacy Consult for: Heparin  Indication: R/o ACS    Patient Data:   Allergies: Allergies  Allergen Reactions  . Penicillins Hives    Patient Measurements: Height: 5\' 3"  (160 cm) Weight: 158 lb 11.7 oz (72 kg) (standing scale) IBW/kg (Calculated) : 56.9  Heparin dosing weight: 71 kg  Vital Signs: Temp:  [97.6 F (36.4 C)-98.9 F (37.2 C)] 98.5 F (36.9 C) (04/07 1438) Pulse Rate:  [56-94] 94  (04/07 1438) Resp:  [14-23] 18  (04/07 1438) BP: (110-144)/(46-90) 110/57 mmHg (04/07 1438) SpO2:  [95 %-100 %] 98 % (04/07 1438) Weight:  [158 lb 11.7 oz (72 kg)] 158 lb 11.7 oz (72 kg) (04/07 0150)  Intake/Output from previous day:  Intake/Output Summary (Last 24 hours) at 02/05/12 1622 Last data filed at 02/05/12 1500  Gross per 24 hour  Intake 538.23 ml  Output    550 ml  Net -11.77 ml    Labs:  Basename 02/05/12 1535 02/05/12 1256 02/05/12 0455 02/04/12 2127 02/04/12 1708  HGB -- -- 10.8* -- 11.7*  HCT -- -- 32.4* -- 34.5*  PLT -- -- 112* -- 116*  APTT -- -- -- -- 34  LABPROT -- -- -- -- 16.6*  INR -- -- -- -- 1.32  HEPARINUNFRC 0.10* -- -- -- --  CREATININE -- -- 1.18 -- 1.25  CKTOTAL -- 85 71 72 --  CKMB -- 2.4 1.9 1.7 --  TROPONINI -- <0.30 <0.30 <0.30 --   Estimated Creatinine Clearance: 41.5 ml/min (by C-G formula based on Cr of 1.18).  Medical History: Past Medical History  Diagnosis Date  . Dizziness   . Diaphoresis   . Palpitations   . COPD (chronic obstructive pulmonary disease)   . Hypertension   . Hyperlipidemia   . GERD (gastroesophageal reflux disease)   . BPH (benign prostatic hypertrophy)   . Personal history of colonic polyps 07/08/2002,06/2011    tubular adenoma, hyperplastic  . Internal hemorrhoids without mention of complication   . Diverticulosis of colon (without mention of hemorrhage)   . Biliary cirrhosis   . Unspecified gastritis and gastroduodenitis without mention of  hemorrhage   . NASH (nonalcoholic steatohepatitis)   . Anxiety   . Arthritis   . Ulcer     Gastric/bleeding  . Diabetes mellitus type 2, diet-controlled     managed with diet  . Vitamin B12 deficiency     Scheduled medications:     . antiseptic oral rinse  15 mL Mouth Rinse BID  . aspirin  324 mg Oral Once  . aspirin  81 mg Oral Daily  . docusate sodium  100 mg Oral BID  . doxazosin  4 mg Oral QHS  . heparin  4,000 Units Intravenous Once  . insulin aspart  0-15 Units Subcutaneous TID WC  . insulin aspart  0-5 Units Subcutaneous QHS  . isosorbide mononitrate  30 mg Oral Daily  . metoprolol tartrate  12.5 mg Oral BID  . pantoprazole  40 mg Oral Q1200  . simvastatin  40 mg Oral QHS  . sodium chloride  3 mL Intravenous Q12H  . sodium chloride  3 mL Intravenous Q12H  . ursodiol  300 mg Oral BID  . DISCONTD: aspirin  81 mg Oral Daily  . DISCONTD: enoxaparin  40 mg Subcutaneous Q24H  . DISCONTD: pantoprazole  40 mg Oral Q breakfast  . DISCONTD: ursodiol  250 mg Oral BID     Assessment:  78 y.o. male  admitted on 02/04/2012, with chest pain. Pharmacy consulted to manage IV heparin. Noted baseline Hgb 10.8, and platelet 112. No bleeding noted.  Goal of Therapy:  1. Heparin level 0.3-0.7 units/ml  Plan:  1. Rebolus eparin 2000 units x 1, then 1150 units/hr infusion.  2. Heparin level in 8 hours.  3. Daily CBC, heparin level.   Junita Push, PharmD, BCPS 02/05/2012, 4:22 PM

## 2012-02-05 NOTE — Consult Note (Signed)
[name] Consultation  Referring Provider: [facility] Primary Care Physician:  Lucilla Edin, MD, MD Primary Gastroenterologist:   Christen Butter Reason for Consultation:  chest pain  HPI: Cory Lawrence is a 78 y.o. male with multiple medical problems known to Dr. Jarold Motto for history of recurrent PUD, GERD, adenomatous colon polyps and biliary cirrhosis, mixed anemia. Patient was last seen in follow up late November at which time he was doing well on Actigall 250mg  BID and PPI.   Patient admitted with chest pain which started early yesterday morning. Patient has a longstanding history of GERD but this did not feel like heartburn. Pain was associated with nausea but no dizziness or SOB.His wife saw a large knot over his breastbone which has since resolved. His cardiac enzymes are negative, EKG unchanged. Patient was given SL NTG yesterday, he pain subsided.  Patient has known cardiac disease. He had a cardiac cath done 01/05/12 with findings noted below: This am patient had recurrent distal chest pain which occurred 15 minutes after taking Urso. He got more SL NTG which helped but didn't totally resolve the pain. Around 10:30am he got a GI cocktail. Patient feels okay now. He ate solids for breakfast and lunch without any pain.    Cath Diffuse coronary artery irregularities. The left main and LAD are moderately calcified. There are moderate stenosis in the LAD but no flow obstructing lesions. The right coronary artery is occluded distally and the posterior descending artery fills via left right collaterals. This RCA occlusion is the etiology of the inferior wall defect seen on Myoview study. The PDA is filled very well from there for left anterior descending artery.  Mildly depressed left ventricle systolic function.   Past Medical History  Diagnosis Date  . Dizziness   . Diaphoresis   . Palpitations   . COPD (chronic obstructive pulmonary disease)   .  Hypertension   . Hyperlipidemia   . GERD (gastroesophageal reflux disease)   . BPH (benign prostatic hypertrophy)   . Personal history of colonic polyps 07/08/2002,06/2011    tubular adenoma, hyperplastic  . Internal hemorrhoids without mention of complication   . Diverticulosis of colon (without mention of hemorrhage)   . Biliary cirrhosis   . Unspecified gastritis and gastroduodenitis without mention of hemorrhage   . NASH (nonalcoholic steatohepatitis)   . Anxiety   . Arthritis   . Ulcer     Gastric/bleeding  . Diabetes mellitus type 2, diet-controlled     managed with diet  . Vitamin B12 deficiency     Past Surgical History  Procedure Date  . Cataract extraction     bilateral  . Eye surgery     left eye x multiple  . Cardiac catheterization     Prior to Admission medications   Medication Sig Start Date End Date Taking? Authorizing Provider  ALPRAZolam Prudy Feeler) 0.5 MG tablet Take 1 tablet by mouth at bedtime as needed.  05/22/11  Yes Historical Provider, MD  aspirin 81 MG tablet Take 81 mg by mouth daily.     Yes Historical Provider, MD  cyanocobalamin (,VITAMIN B-12,) 1000 MCG/ML injection Inject 1 mL (1,000 mcg total) into the muscle every 30 (thirty) days. 06/08/11  Yes Mardella Layman, MD  doxazosin (CARDURA) 4 MG tablet Take 4 mg by mouth at bedtime.     Yes Historical Provider, MD  Fluticasone-Salmeterol (ADVAIR DISKUS) 250-50 MCG/DOSE AEPB Inhale 1 puff into the lungs as needed. Shortness of breath.   Yes Historical Provider,  MD  Ipratropium-Albuterol (COMBIVENT IN) Inhale 1 puff into the lungs as needed. Shortness of breath.   Yes Historical Provider, MD  Melatonin 3 MG CAPS Take 3 mg by mouth at bedtime as needed. Sleep    Yes Historical Provider, MD  Multiple Vitamin (MULTIVITAMIN) tablet Take 1 tablet by mouth daily.     Yes Historical Provider, MD  Omega-3 Fatty Acids (FISH OIL) 1000 MG CAPS Take 1,000 mg by mouth daily.    Yes Historical Provider, MD  pantoprazole  (PROTONIX) 40 MG tablet Take 40 mg by mouth daily with breakfast.   Yes Historical Provider, MD  simvastatin (ZOCOR) 40 MG tablet Take 40 mg by mouth at bedtime.     Yes Historical Provider, MD  triamcinolone (KENALOG) 0.1 % cream Apply 1 application topically daily. Apply to ankles.   Yes Historical Provider, MD  ursodiol (ACTIGALL) 250 MG tablet Take 1 tablet (250 mg total) by mouth 2 (two) times daily. 05/17/11 05/16/12 Yes Mardella Layman, MD  vitamin E 400 UNIT capsule Take 400 Units by mouth daily.      Historical Provider, MD    Current Facility-Administered Medications  Medication Dose Route Frequency Provider Last Rate Last Dose  . 0.9 %  sodium chloride infusion  250 mL Intravenous PRN Therisa Doyne, MD      . acetaminophen (TYLENOL) tablet 650 mg  650 mg Oral Q6H PRN Therisa Doyne, MD       Or  . acetaminophen (TYLENOL) suppository 650 mg  650 mg Rectal Q6H PRN Therisa Doyne, MD      . albuterol (PROVENTIL) (5 MG/ML) 0.5% nebulizer solution 2.5 mg  2.5 mg Nebulization Q2H PRN Therisa Doyne, MD      . ALPRAZolam Prudy Feeler) tablet 0.5 mg  0.5 mg Oral QHS PRN Therisa Doyne, MD      . alum & mag hydroxide-simeth (MAALOX/MYLANTA) 200-200-20 MG/5ML suspension 30 mL  30 mL Oral Q6H PRN Therisa Doyne, MD   30 mL at 02/05/12 0631  . antiseptic oral rinse (BIOTENE) solution 15 mL  15 mL Mouth Rinse BID Rhetta Mura, MD   15 mL at 02/05/12 0814  . aspirin chewable tablet 324 mg  324 mg Oral Once Dione Booze, MD   324 mg at 02/04/12 1737  . aspirin chewable tablet 81 mg  81 mg Oral Daily Rhetta Mura, MD   81 mg at 02/05/12 1045  . docusate sodium (COLACE) capsule 100 mg  100 mg Oral BID Therisa Doyne, MD      . doxazosin (CARDURA) tablet 4 mg  4 mg Oral QHS Therisa Doyne, MD      . gi cocktail (Maalox,Lidocaine,Donnatal)  30 mL Oral TID PRN Sorin Luanne Bras, MD   30 mL at 02/05/12 1035  . guaiFENesin-dextromethorphan (ROBITUSSIN DM) 100-10 MG/5ML  syrup 5 mL  5 mL Oral Q4H PRN Therisa Doyne, MD      . heparin ADULT infusion 100 units/mL (25000 units/250 mL)  850 Units/hr Intravenous Continuous Rhetta Mura, MD 8.5 mL/hr at 02/05/12 0809 850 Units/hr at 02/05/12 0809  . heparin bolus via infusion 4,000 Units  4,000 Units Intravenous Once Rhetta Mura, MD   4,000 Units at 02/05/12 0809  . HYDROcodone-acetaminophen (NORCO) 5-325 MG per tablet 1-2 tablet  1-2 tablet Oral Q4H PRN Therisa Doyne, MD      . insulin aspart (novoLOG) injection 0-15 Units  0-15 Units Subcutaneous TID WC Anastassia Doutova, MD      . insulin aspart (novoLOG) injection 0-5 Units  0-5 Units Subcutaneous QHS Therisa Doyne, MD      . isosorbide mononitrate (IMDUR) 24 hr tablet 30 mg  30 mg Oral Daily Rhonda G Barrett, PA   30 mg at 02/05/12 1154  . metoprolol tartrate (LOPRESSOR) tablet 12.5 mg  12.5 mg Oral BID Rhonda G Barrett, PA      . morphine 2 MG/ML injection 2 mg  2 mg Intravenous Q2H PRN Sorin C Laza, MD      . nitroGLYCERIN (NITROSTAT) SL tablet 0.4 mg  0.4 mg Sublingual Q5 Min x 3 PRN Dione Booze, MD   0.4 mg at 02/05/12 0704  . ondansetron (ZOFRAN) tablet 4 mg  4 mg Oral Q6H PRN Therisa Doyne, MD       Or  . ondansetron (ZOFRAN) injection 4 mg  4 mg Intravenous Q6H PRN Therisa Doyne, MD      . pantoprazole (PROTONIX) EC tablet 40 mg  40 mg Oral Q1200 Therisa Doyne, MD   40 mg at 02/05/12 1154  . simvastatin (ZOCOR) tablet 40 mg  40 mg Oral QHS Anastassia Doutova, MD      . sodium chloride 0.9 % injection 3 mL  3 mL Intravenous Q12H Therisa Doyne, MD   3 mL at 02/05/12 1035  . sodium chloride 0.9 % injection 3 mL  3 mL Intravenous Q12H Anastassia Doutova, MD      . sodium chloride 0.9 % injection 3 mL  3 mL Intravenous PRN Therisa Doyne, MD      . ursodiol (ACTIGALL) capsule 300 mg  300 mg Oral BID Therisa Doyne, MD   300 mg at 02/05/12 0256  . DISCONTD: aspirin tablet 81 mg  81 mg Oral Daily Therisa Doyne, MD      . DISCONTD: enoxaparin (LOVENOX) injection 40 mg  40 mg Subcutaneous Q24H Therisa Doyne, MD      . DISCONTD: Melatonin CAPS 3 mg  3 mg Oral QHS PRN Therisa Doyne, MD      . DISCONTD: pantoprazole (PROTONIX) EC tablet 40 mg  40 mg Oral Q breakfast Therisa Doyne, MD      . DISCONTD: ursodiol (ACTIGALL) tablet 250 mg  250 mg Oral BID Therisa Doyne, MD        Allergies as of 02/04/2012 - Review Complete 02/04/2012  Allergen Reaction Noted  . Penicillins Hives     Family History  Problem Relation Age of Onset  . Heart failure Mother   . Diabetes Mother   . Diabetes Sister   . Heart failure Sister   . Colon cancer Neg Hx   . Cancer Mother     unknown type    History   Social History  . Marital Status: Married    Spouse Name: N/A    Number of Children: 2  . Years of Education: N/A   Occupational History  . retired    Social History Main Topics  . Smoking status: Former Games developer  . Smokeless tobacco: Former Neurosurgeon    Quit date: 05/31/1996  . Alcohol Use: No  . Drug Use: No  . Sexually Active: No   Other Topics Concern  . Not on file    Review of Systems: All other systems reviewed and negative except where noted in HPI .  PHYSICAL EXAM: Vital signs in last 24 hours: Temp:  [97.6 F (36.4 C)-98.9 F (37.2 C)] 98.3 F (36.8 C) (04/07 0432) Pulse Rate:  [56-87] 59  (04/07 0432) Resp:  [14-23] 18  (04/07 0432) BP: (115-144)/(46-90)  127/63 mmHg (04/07 0432) SpO2:  [95 %-100 %] 98 % (04/07 0432) Weight:  [158 lb 11.7 oz (72 kg)-160 lb (72.576 kg)] 158 lb 11.7 oz (72 kg) (04/07 0150) Last BM Date: 02/03/12 General:   Pleasant white male in NAD Head:  Normocephalic and atraumatic. Eyes:   No icterus.   Conjunctiva pink. Ears:  Normal auditory acuity. Neck:  Supple; no masses felt Lungs:  Respirations even and unlabored. Lungs clear to auscultation bilaterally.   No wheezes, crackles, or rhonchi.  Heart:  Regular rate and  rhythm Abdomen:  Soft, nondistended, nontender. Normal bowel sounds. No appreciable masses or hepatomegaly.  Rectal:  Not performed.  Msk:  Symmetrical without gross deformities. No chest wall tenderness Extremities:  Without edema. Neurologic:  Alert and  oriented  grossly normal neurologically. Skin:  Intact without significant lesions or rashes. Cervical Nodes:  No significant cervical adenopathy. Psych:  Alert and cooperative. Normal affect.  LAB RESULTS:  Basename 02/05/12 0455 02/04/12 1708  WBC 10.1 13.9*  HGB 10.8* 11.7*  HCT 32.4* 34.5*  PLT 112* 116*   BMET  Basename 02/05/12 0455 02/04/12 1708  NA 138 137  K 3.5 3.6  CL 103 102  CO2 26 25  GLUCOSE 94 107*  BUN 27* 25*  CREATININE 1.18 1.25  CALCIUM 8.1* 8.5   LFT  Basename 02/05/12 0455  PROT 6.4  ALBUMIN 3.3*  AST 13  ALT 14  ALKPHOS 74  BILITOT 0.9  BILIDIR --  IBILI --   PT/INR  Basename 02/04/12 1708  LABPROT 16.6*  INR 1.32    STUDIES: Dg Chest Portable 1 View  02/04/2012  *RADIOLOGY REPORT*  Clinical Data: Chest pain  PORTABLE CHEST - 1 VIEW  Comparison: 12/23/2011  Findings: COPD with hyperinflation of the lungs.  Small bilateral effusions and mild bibasilar atelectasis.  Vascularity normal.  No heart failure or edema is present.  IMPRESSION: Small bilateral pleural effusions and mild bibasilar atelectasis without other signs of heart failure.  Original Report Authenticated By: Camelia Phenes, M.D.     PREVIOUS ENDOSCOPIES:  colonoscopy August 2012. Findings included diverticulosis and a non-adenomatous polyp.   EGD Jan 2004. Findings: gastritis.  IMPRESSION / PLAN: 1. Chest pain. Cardiac enzymes negative. Cardiology following and has him on ASA, IV Heparin, Imdur and beta blocker. He has no odynophagia so doubt this is pill esophagitis. If not cardiac pain then need to consider esophageal spasm, GERD, musculoskeletal. Patient will likely need EGD unless pain declares itself as being  cardiac. An ultrasound has been ordered. Pain not typical for gallbladder /biliary disease though it is certainly possible.   2. CAD, cardiac cath 2 weeks ago. Disease being treated medically at this point. 3.Leukocytosis, WBC 13.9 yesterday, normal today, ?etiology,possibly stress response. 4. Primary Biliary Cirrhosis, stable on Urso.   5.Chronic  anemia. Hemoglobin down slightly below baseline, this could be volume related. Patient has a B12 deficiency but it appears he has renal insufficiency as well which likely contributes to anemia.  6. GERD, does well on daily PPI   Thanks   LOS: 1 day   Willette Cluster  02/05/2012, 1:35 PM

## 2012-02-05 NOTE — Progress Notes (Signed)
Pt started having chest pain this morning described as a raw feeling in center of chest. Pt states this started an hour after taking Ursodiol with water and has gotten progressively worse. Maalox given at 0631 with no relief. BP 146/57, 98% on 2 L, HR 85. 12 lead EKG done. 1 SL Nitroglycerin given at 0655. BP 117/53. 2nd Nitro given at [employer]. BP 108/53. 3rd SL Nitro given at 0710. BP 115/53. Pt states pain has decreased, but it's still there. Will notify MD.  Alfonso Ellis, RN

## 2012-02-05 NOTE — Consult Note (Signed)
CARDIOLOGY CONSULT NOTE   Patient ID: Cory Lawrence MRN: [id number] DOB/AGE: 76-Nov-1926 76 y.o.  Admit date: 02/04/2012  Primary Physician   Lucilla Edin, MD, MD Primary Cardiologist   [place] Reason for Consultation   chest pain  Cory Lawrence is a 78 y.o. male with a history of CAD.   Last p.m., he states he was getting ready to go to bed and had onset of chest pain. He remembers getting sublingual nitroglycerin x3 and IV medication. His chest pain resolved. He remembers having chest pain this morning and receiving medication for it. He is not clear that there was more than one episode of chest pain but M.D./R.N. notes reflect this. He says this is the first episode of chest pain since his heart catheterization. He denies any other symptoms such as any change in his dyspnea on exertion, orthopnea, PND or edema. He states he has eaten poorly for the last 3 days because he did not feel like eating. He also admits to constipation recently which resolved today. He feels that he may have had some nausea and that may have contributed to him not eating but he is not clear on this and states that he just did not want food. His chest pain was located in the xiphoid area and he is unable to quantify it. He describes it as hurting but is unable to qualify further. He does not remember any exertional symptoms.   Past Medical History  Diagnosis Date  . Dizziness   . Diaphoresis   . Palpitations   . COPD (chronic obstructive pulmonary disease)   . Hypertension   . Hyperlipidemia   . GERD (gastroesophageal reflux disease)   . BPH (benign prostatic hypertrophy)   . Personal history of colonic polyps 07/08/2002,06/2011    tubular adenoma, hyperplastic  . Internal hemorrhoids without mention of complication   . Diverticulosis of colon (without mention of hemorrhage)   . Biliary cirrhosis   . Unspecified gastritis and gastroduodenitis without mention of hemorrhage   . NASH (nonalcoholic  steatohepatitis)   . Anxiety   . Arthritis   . Ulcer     Gastric/bleeding  . Diabetes mellitus type 2, diet-controlled     managed with diet  . Vitamin B12 deficiency      Past Surgical History  Procedure Date  . Cataract extraction     bilateral  . Eye surgery     left eye x multiple  . Cardiac catheterization     Allergies  Allergen Reactions  . Penicillins Hives    I have reviewed the patient's current medications. Scheduled:   . antiseptic oral rinse  15 mL Mouth Rinse BID  . aspirin  324 mg Oral Once  . aspirin  81 mg Oral Daily  . docusate sodium  100 mg Oral BID  . doxazosin  4 mg Oral QHS  . heparin  4,000 Units Intravenous Once  . insulin aspart  0-15 Units Subcutaneous TID WC  . insulin aspart  0-5 Units Subcutaneous QHS  . pantoprazole  40 mg Oral Q1200  . simvastatin  40 mg Oral QHS  . sodium chloride  3 mL Intravenous Q12H  . sodium chloride  3 mL Intravenous Q12H  . ursodiol  300 mg Oral BID  . DISCONTD: aspirin  81 mg Oral Daily  . DISCONTD: enoxaparin  40 mg Subcutaneous Q24H  . DISCONTD: pantoprazole  40 mg Oral Q breakfast  . DISCONTD: ursodiol  250 mg Oral BID  Continuous:   . heparin 850 Units/hr (02/05/12 0809)   Medication Sig  . ALPRAZolam (XANAX) 0.5 MG tablet Take 1 tablet by mouth at bedtime as needed.   Marland Kitchen aspirin 81 MG tablet Take 81 mg by mouth daily.    . cyanocobalamin (,VITAMIN B-12,) 1000 MCG/ML injection Inject 1 mL (1,000 mcg total) into the muscle every 30 days.  Marland Kitchen doxazosin (CARDURA) 4 MG tablet Take 4 mg by mouth at bedtime.    . Fluticasone-Salmeterol (ADVAIR DISKUS) 250-50 MCG/DOSE AEPB Inhale 1 puff into the lungs as needed. Shortness of breath.  . Ipratropium-Albuterol (COMBIVENT IN) Inhale 1 puff into the lungs as needed. Shortness of breath.  . Melatonin 3 MG CAPS Take 3 mg by mouth at bedtime as needed. Sleep  . Multiple Vitamin (MULTIVITAMIN) tablet Take 1 tablet by mouth daily.    . Omega-3 Fatty Acids (FISH OIL)  1000 MG CAPS Take 1,000 mg by mouth daily.   . pantoprazole (PROTONIX) 40 MG tablet Take 40 mg by mouth daily with breakfast.  . simvastatin (ZOCOR) 40 MG tablet Take 40 mg by mouth at bedtime.    . triamcinolone (KENALOG) 0.1 % cream Apply 1 application topically daily. Apply to ankles.  . ursodiol (ACTIGALL) 250 MG tablet Take 1 tablet (250 mg total) by mouth 2 (two) times daily.  . vitamin E 400 UNIT capsule Take 400 Units by mouth daily.      History   Social History  . Marital Status: Married, lives with his wife     Spouse Name: N/A    Number of Children: 2  . Years of Education: N/A   Occupational History  . retired    Social History Main Topics  . Smoking status: Former Games developer  . Smokeless tobacco: Former Neurosurgeon    Quit date: 05/31/1996  . Alcohol Use: No  . Drug Use: No  . Sexually Active: No   Family History  Problem Relation Age of Onset  . Heart failure Mother   . Diabetes Mother   . Diabetes Sister   . Heart failure Sister   . Colon cancer Neg Hx   . Cancer Mother     unknown type     ROS: He has some chronic dyspnea on exertion. He coughs occasionally and feels that he does have some congestion. He has not been short of breath at rest. He has not had diaphoresis and does not remember any vomiting. He seems to have some memory problems but is oriented and alert. He has not had any other recent illnesses, fevers or chills. He says he was brought to the hospital by his wife because of his poor appetite. Full 14 point review of systems complete and found to be negative unless listed  above  Physical Exam: Blood pressure 127/63, pulse 59, temperature 98.3 F (36.8 C), temperature source Oral, resp. rate 18, height 5\' 3"  (1.6 m), weight 158 lb 11.7 oz (72 kg), SpO2 98.00%.   General: Well developed, well nourished, elderly male in no acute distress Head: Eyes PERRLA, No xanthomas.   Normocephalic and atraumatic, oropharynx without edema or exudate. Dentition  poor Lungs: Decreased breath sounds in bilateral bases with rales but no wheeze. Heart:Heart irregular rate and rhythm with S1, S2, soft systolic  murmur. pulses are 2+ upper extrem, decreased in the lower extremities.   Neck: No carotid bruit. No lymphadenopathy.  JVD at about 9 cm. Abdomen: Bowel sounds present, abdomen soft and non-tender without masses or hernias noted. Msk:  No spine or cva tenderness. Normal strength and tone for age, no joint deformities or effusions. Extremities: No clubbing or cyanosis. Trace pedal edema.  Neuro: Alert and oriented X 2. No focal deficits noted. Psych:  Good affect, responds appropriately Skin: No rashes noted, some small lesions, healing.  Labs:   Lab Results  Component Value Date   WBC 10.1 02/05/2012   HGB 10.8* 02/05/2012   HCT 32.4* 02/05/2012   MCV 96.7 02/05/2012   PLT 112* 02/05/2012    Basename 02/04/12 1708  INR 1.32    Lab 02/05/12 0455  NA 138  K 3.5  CL 103  CO2 26  BUN 27*  CREATININE 1.18  CALCIUM 8.1*  PROT 6.4  BILITOT 0.9  ALKPHOS 74  ALT 14  AST 13  GLUCOSE 94    Basename 02/05/12 0455 02/04/12 2127 02/04/12 1708  CKTOTAL 71 72 --  CKMB 1.9 1.7 --  TROPONINI <0.30 <0.30 <0.30   Cardiac Cath: 01/20/2012 Left Main: The left main has a mild degree of calcification. There are minor little regular disease.  Left anterior Descending: The left anterior descending artery is moderately. There Are Several Irregularities Which Represent a 40-50% Stenosis but It Does Not Appear to Be Flow Obstructive. The Mid and Distal LAD Have Minor Little Regular Allergies. The First Item Artery Is a Moderate-Sized Vessel with Minor Little Regular to the Second Diagonal Vessel Is a Moderate Size Vessel with a Proximal 40-50% Stenosis. This Vessel Bifurcates into 2 Smaller Branches Both of with Minor Luminal Allergies. The distal LAD supplies collaterals to the occluded posterior descending artery from the RCA.  Left Circumflex: The circumflex  artery is a large vessel. The first acute marginal artery is a moderate-sized vessel and has minor little regular to. The distal circumflex artery has minor little regular disease between 20 and 30%. There is a large posterior lateral branch has minor little regular disease.  Right Coronary Artery: The right coronary artery is moderate to large in size. There is a mid 90% stenosis. The distal RCA has a 70-80% stenosis and is then occluded at the posterior descending artery. There is no significant flow down to the posterior descending artery. The posterior descending arteries filled by collaterals from the terminal LAD.  LV Gram: The left ventriculogram was performed in the 30 RAO position. It reveals a mildly depressed left ventricular systolic function with ejection fraction of 45-50%. There is inferior wall hypokinesis.  Complications: No apparent complications  Patient did tolerate procedure well.  Conclusions:  1. Diffuse coronary artery irregularities. The left main and LAD are moderately calcified. There are moderate stenosis in the LAD but no flow obstructing lesions. The right coronary artery is occluded distally and the posterior descending artery fills via left right collaterals. This RCA occlusion is the etiology of the inferior wall defect seen on Myoview study.  The PDA is filled very well from there for left anterior descending artery and I do not think that revascularization of the right coronary artery is indicated. There is very poor flow and I do not think that a stent would remain patent. We'll continue with medical therapy as needed.  2. Mildly depressed left ventricle systolic function.  Radiology:  Dg Chest Portable 1 View 02/04/2012  *RADIOLOGY REPORT*  Clinical Data: Chest pain  PORTABLE CHEST - 1 VIEW  Comparison: 12/23/2011  Findings: COPD with hyperinflation of the lungs.  Small bilateral effusions and mild bibasilar atelectasis.  Vascularity normal.  No heart failure or edema  is  present.  IMPRESSION: Small bilateral pleural effusions and mild bibasilar atelectasis without other signs of heart failure.  Original Report Authenticated By: Camelia Phenes, M.D.   EKG: 05-Feb-2012 05:52:40  Sinus rhythm with marked sinus arrhythmia with frequent , and consecutive Premature ventricular complexes Prolonged QT Vent. rate 94 BPM PR interval 188 ms QRS duration 102 ms QT/QTc 400/500 ms P-R-T axes 63 -13 65   ASSESSMENT AND PLAN:   The patient was seen today by Dr Graciela Husbands, the patient evaluated and the data reviewed.   Active Problems:  GERD - per primary M.D.   Chest pain - his chest pain resolved with medical therapy and his cardiac enzymes have remained negative. M.D. to review his cath report     Hypertension - SBP 120s to 130s on current meds   CAD (coronary artery disease) - see cath report, will check a lipid profile, he is on his home dose of Zocor  Otherwise, per primary M.D.  Signed: Theodore Demark 02/05/2012, 9:06 AM  Patient with known significant coronary disease. His symptoms are improved on heparin nitroglycerin and antacids. We will initiate empiric anti-ischemic therapy knowing that he could be ischemic on his inferior wall. Given that his symptoms are postprandial, we'll also order a right upper quadrant ultrasound to look at gallbladder disease. We'll follow with you

## 2012-02-05 NOTE — Progress Notes (Signed)
Pt states chest pain is now a 2 on a scale of 0-10. Heparin drip started per MD order. Will continue to monitor. Dion Saucier

## 2012-02-05 NOTE — Progress Notes (Signed)
PHARMACY - ANTICOAGULATION CONSULT INITIAL NOTE  Pharmacy Consult for: Heparin  Indication: R/o ACS    Patient Data:   Allergies: Allergies  Allergen Reactions  . Penicillins Hives    Patient Measurements: Height: 5\' 3"  (160 cm) Weight: 158 lb 11.7 oz (72 kg) (standing scale) IBW/kg (Calculated) : 56.9  Heparin dosing weight: 71 kg  Vital Signs: Temp:  [97.6 F (36.4 C)-98.9 F (37.2 C)] 98.3 F (36.8 C) (04/07 0432) Pulse Rate:  [56-87] 59  (04/07 0432) Resp:  [14-23] 18  (04/07 0432) BP: (115-144)/(46-90) 127/63 mmHg (04/07 0432) SpO2:  [95 %-100 %] 98 % (04/07 0432) Weight:  [158 lb 11.7 oz (72 kg)-160 lb (72.576 kg)] 158 lb 11.7 oz (72 kg) (04/07 0150)  Intake/Output from previous day:  Intake/Output Summary (Last 24 hours) at 02/05/12 0726 Last data filed at 02/05/12 0700  Gross per 24 hour  Intake      0 ml  Output    350 ml  Net   -350 ml    Labs:  Basename 02/05/12 0455 02/04/12 2127 02/04/12 1708  HGB 10.8* -- 11.7*  HCT 32.4* -- 34.5*  PLT 112* -- 116*  APTT -- -- 34  LABPROT -- -- 16.6*  INR -- -- 1.32  HEPARINUNFRC -- -- --  CREATININE 1.18 -- 1.25  CKTOTAL 71 72 --  CKMB 1.9 1.7 --  TROPONINI <0.30 <0.30 <0.30   Estimated Creatinine Clearance: 41.5 ml/min (by C-G formula based on Cr of 1.18).  Medical History: Past Medical History  Diagnosis Date  . Dizziness   . Diaphoresis   . Palpitations   . COPD (chronic obstructive pulmonary disease)   . Hypertension   . Hyperlipidemia   . GERD (gastroesophageal reflux disease)   . BPH (benign prostatic hypertrophy)   . Personal history of colonic polyps 07/08/2002,06/2011    tubular adenoma, hyperplastic  . Internal hemorrhoids without mention of complication   . Diverticulosis of colon (without mention of hemorrhage)   . Biliary cirrhosis   . Unspecified gastritis and gastroduodenitis without mention of hemorrhage   . NASH (nonalcoholic steatohepatitis)   . Anxiety   . Arthritis   . Ulcer      Gastric/bleeding  . Diabetes mellitus type 2, diet-controlled     managed with diet  . Vitamin B12 deficiency     Scheduled medications:     . antiseptic oral rinse  15 mL Mouth Rinse BID  . aspirin  324 mg Oral Once  . aspirin  81 mg Oral Daily  . docusate sodium  100 mg Oral BID  . doxazosin  4 mg Oral QHS  . enoxaparin  40 mg Subcutaneous Q24H  . insulin aspart  0-15 Units Subcutaneous TID WC  . insulin aspart  0-5 Units Subcutaneous QHS  . pantoprazole  40 mg Oral Q1200  . simvastatin  40 mg Oral QHS  . sodium chloride  3 mL Intravenous Q12H  . sodium chloride  3 mL Intravenous Q12H  . ursodiol  300 mg Oral BID  . DISCONTD: aspirin  81 mg Oral Daily  . DISCONTD: pantoprazole  40 mg Oral Q breakfast  . DISCONTD: ursodiol  250 mg Oral BID     Assessment:  78 y.o. male admitted on 02/04/2012, with chest pain. Pharmacy consulted to manage IV heparin. Noted baseline Hgb 10.8, and platelet 112.   Goal of Therapy:  1. Heparin level 0.3-0.7 units/ml  Plan:  1. Heparin 4000 units x 1, then 850 units/hr infusion.  2. Heparin level in 8 hours.  3. Daily CBC, heparin level.   Dineen Kid Thad Ranger, PharmD 02/05/2012, 7:26 AM

## 2012-02-05 NOTE — Progress Notes (Signed)
Monitor tech reported that patient converted from NSR to A fib at 0712 this morning. Rate controlled in 80s-90s. Vital signs stable. PA notified. Will continue to monitor. Dion Saucier

## 2012-02-05 NOTE — Progress Notes (Signed)
I interviewed and examined this patient and discussed the care plan with Dr. Lula Olszewski and agree with assessment and plan as documented in the progress note for today.    Mary Hockey A. Sheffield Slider, MD Family Medicine Teaching Service Attending  02/05/2012 1:51 PM

## 2012-02-05 NOTE — Progress Notes (Signed)
Rec'd a call about pt going into afib. Rate is controlled and he is on a BB plus a heparin gtt. Continue to follow but no changes for now.  Bjorn Loser Niema Carrara 02/05/2012 4:17 PM

## 2012-02-06 ENCOUNTER — Inpatient Hospital Stay (HOSPITAL_COMMUNITY): Payer: Medicare Other

## 2012-02-06 ENCOUNTER — Encounter: Payer: Medicare Other | Admitting: Nurse Practitioner

## 2012-02-06 DIAGNOSIS — I359 Nonrheumatic aortic valve disorder, unspecified: Secondary | ICD-10-CM

## 2012-02-06 LAB — CBC
HCT: 31.6 % — ABNORMAL LOW (ref 39.0–52.0)
MCHC: 33.2 g/dL (ref 30.0–36.0)
RDW: 14.6 % (ref 11.5–15.5)

## 2012-02-06 LAB — GLUCOSE, CAPILLARY
Glucose-Capillary: 119 mg/dL — ABNORMAL HIGH (ref 70–99)
Glucose-Capillary: 106 mg/dL — ABNORMAL HIGH (ref 70–99)
Glucose-Capillary: 98 mg/dL (ref 70–99)

## 2012-02-06 LAB — HEPARIN LEVEL (UNFRACTIONATED): Heparin Unfractionated: 0.23 IU/mL — ABNORMAL LOW (ref 0.30–0.70)

## 2012-02-06 MED ORDER — RIVAROXABAN 15 MG PO TABS
15.0000 mg | ORAL_TABLET | Freq: Once | ORAL | Status: AC
  Administered 2012-02-06: 15 mg via ORAL
  Filled 2012-02-06: qty 1

## 2012-02-06 MED ORDER — DABIGATRAN ETEXILATE MESYLATE 75 MG PO CAPS: 75.0000 mg | ORAL_CAPSULE | Freq: Two times a day (BID) | ORAL | Status: DC

## 2012-02-06 MED ORDER — RIVAROXABAN 15 MG PO TABS
15.0000 mg | ORAL_TABLET | Freq: Every day | ORAL | Status: DC
  Filled 2012-02-06: qty 1

## 2012-02-06 MED ORDER — FLUTICASONE-SALMETEROL 500-50 MCG/DOSE IN AEPB
1.0000 | INHALATION_SPRAY | Freq: Two times a day (BID) | RESPIRATORY_TRACT | Status: DC
  Administered 2012-02-06 – 2012-02-07 (×2): 1 via RESPIRATORY_TRACT
  Filled 2012-02-06: qty 14

## 2012-02-06 MED ORDER — RIVAROXABAN 15 MG PO TABS
15.0000 mg | ORAL_TABLET | Freq: Every day | ORAL | Status: DC
  Administered 2012-02-07: 15 mg via ORAL
  Filled 2012-02-06: qty 1

## 2012-02-06 MED ORDER — HEPARIN BOLUS VIA INFUSION
1000.0000 [IU] | Freq: Once | INTRAVENOUS | Status: AC
  Administered 2012-02-06: 1000 [IU] via INTRAVENOUS
  Filled 2012-02-06: qty 1000

## 2012-02-06 NOTE — Progress Notes (Signed)
PHARMACY - ANTICOAGULATION CONSULT Follow-Up NOTE  Pharmacy Consult for: Heparin  Indication: R/o ACS    Patient Data:   Allergies: Allergies  Allergen Reactions  . Penicillins Hives    Patient Measurements: Height: 5\' 3"  (160 cm) Weight: 158 lb 11.7 oz (72 kg) (standing scale) IBW/kg (Calculated) : 56.9  Heparin dosing weight: 71 kg  Vital Signs: Temp:  [97.6 F (36.4 C)-98.9 F (37.2 C)] 98.9 F (37.2 C) (04/07 2115) Pulse Rate:  [59-94] 83  (04/07 2115) Resp:  [18-20] 20  (04/07 2115) BP: (110-132)/(57-63) 117/57 mmHg (04/07 2115) SpO2:  [96 %-98 %] 96 % (04/07 2115) Weight:  [158 lb 11.7 oz (72 kg)] 158 lb 11.7 oz (72 kg) (04/07 0150)  Intake/Output from previous day:  Intake/Output Summary (Last 24 hours) at 02/06/12 0111 Last data filed at 02/05/12 1726  Gross per 24 hour  Intake 778.23 ml  Output    550 ml  Net 228.23 ml    Labs:  Basename 02/06/12 0031 02/05/12 2114 02/05/12 1535 02/05/12 1256 02/05/12 0455 02/04/12 1708  HGB -- -- -- -- 10.8* 11.7*  HCT -- -- -- -- 32.4* 34.5*  PLT -- -- -- -- 112* 116*  APTT -- -- -- -- -- 34  LABPROT -- -- -- -- -- 16.6*  INR -- -- -- -- -- 1.32  HEPARINUNFRC 0.23* -- 0.10* -- -- --  CREATININE -- -- -- -- 1.18 1.25  CKTOTAL -- 105 -- 85 71 --  CKMB -- 2.4 -- 2.4 1.9 --  TROPONINI -- <0.30 -- <0.30 <0.30 --   Estimated Creatinine Clearance: 41.5 ml/min (by C-G formula based on Cr of 1.18).  Medical History: Past Medical History  Diagnosis Date  . Dizziness   . Diaphoresis   . Palpitations   . COPD (chronic obstructive pulmonary disease)   . Hypertension   . Hyperlipidemia   . GERD (gastroesophageal reflux disease)   . BPH (benign prostatic hypertrophy)   . Personal history of colonic polyps 07/08/2002,06/2011    tubular adenoma, hyperplastic  . Internal hemorrhoids without mention of complication   . Diverticulosis of colon (without mention of hemorrhage)   . Biliary cirrhosis   . Unspecified gastritis  and gastroduodenitis without mention of hemorrhage   . NASH (nonalcoholic steatohepatitis)   . Anxiety   . Arthritis   . Ulcer     Gastric/bleeding  . Diabetes mellitus type 2, diet-controlled     managed with diet  . Vitamin B12 deficiency     Scheduled medications:     . antiseptic oral rinse  15 mL Mouth Rinse BID  . aspirin  81 mg Oral Daily  . docusate sodium  100 mg Oral BID  . doxazosin  4 mg Oral QHS  . heparin  2,000 Units Intravenous Once  . heparin  4,000 Units Intravenous Once  . insulin aspart  0-15 Units Subcutaneous TID WC  . insulin aspart  0-5 Units Subcutaneous QHS  . isosorbide mononitrate  30 mg Oral Daily  . metoprolol tartrate  12.5 mg Oral BID  . pantoprazole  40 mg Oral Q1200  . simvastatin  40 mg Oral QHS  . sodium chloride  3 mL Intravenous Q12H  . sodium chloride  3 mL Intravenous Q12H  . ursodiol  300 mg Oral BID  . DISCONTD: aspirin  81 mg Oral Daily  . DISCONTD: enoxaparin  40 mg Subcutaneous Q24H  . DISCONTD: pantoprazole  40 mg Oral Q breakfast  . DISCONTD: ursodiol  250 mg Oral BID     Assessment:  78 y.o. male admitted on 02/04/2012, with chest pain. Pharmacy consulted to manage IV heparin. Heparin level is below-goal. RN reports no problem with line / infusion.  Goal of Therapy:  1. Heparin level 0.3-0.7 units/ml  Plan:  1. Rebolus heparin 1000 units x 1, then 1300 units/hr infusion.  2. Heparin level in 8 hours.  3. Daily CBC, heparin level.   Lorre Munroe, PharmD 02/06/2012, 1:11 AM

## 2012-02-06 NOTE — Progress Notes (Signed)
[facility] Daily Rounding Note 02/06/2012, 10:05 AM  SUBJECTIVE:       Patient having A Fib.  On heparin drip.  No further epigstric/chest pain in more than 36 hours. No nausea.  Small brown stool yesterday.  No dizzyness.  2D echo this AM.    OBJECTIVE:        General: Elderly, obese,  slightly tremulous WM.     Vital signs in last 24 hours:    Temp:  [97.5 F (36.4 C)-98.9 F (37.2 C)] 97.5 F (36.4 C) (04/08 0645) Pulse Rate:  [83-98] 98  (04/08 0645) Resp:  [18-20] 19  (04/08 0645) BP: (110-122)/(57-70) 122/70 mmHg (04/08 0645) SpO2:  [96 %-98 %] 96 % (04/08 0645) Weight:  [159 lb 6.3 oz (72.3 kg)] 159 lb 6.3 oz (72.3 kg) (04/08 0645) Last BM Date: 02/06/12 (small amt)  Heart: Irreg/Irreg, rate in 70s at present on monitor Chest: Clear but diminished BS globally.  No cough.  Slight dyspnea with speech. Abdomen: soft, obese, NT, ND.  Active BS  Extremities: no pedal edema Heme:  Large bruise on medial right thigh. Neuro/Psych:  Pleasant, not confused, not agitated.   Intake/Output from previous day: 04/07 0701 - 04/08 0700 In: 778.2 [P.O.:720; I.V.:58.2] Out: 200 [Urine:200]  Intake/Output this shift:    Lab Results:  Basename 02/05/12 0455 02/04/12 1708  WBC 10.1 13.9*  HGB 10.8* 11.7*  HCT 32.4* 34.5*  PLT 112* 116*   BMET  Basename 02/05/12 0455 02/04/12 1708  NA 138 137  K 3.5 3.6  CL 103 102  CO2 26 25  GLUCOSE 94 107*  BUN 27* 25*  CREATININE 1.18 1.25  CALCIUM 8.1* 8.5   LFT  Basename 02/05/12 0455  PROT 6.4  ALBUMIN 3.3*  AST 13  ALT 14  ALKPHOS 74  BILITOT 0.9  BILIDIR --  IBILI --   PT/INR  Basename 02/04/12 1708  LABPROT 16.6*  INR 1.32   Studies/Results: US Abdomen Complete  02/06/2012  *RADIOLOGY REPORT*  Clinical Data:  Postprandial nausea.  COMPLETE ABDOMINAL ULTRASOUND  Comparison:  09/21/2007.  Findings:  Gallbladder:  No gallstones, gallbladder wall thickening, or pericholecystic fluid.  Common bile duct:  Normal,  measuring 5.7 mm in diameter proximally.  Liver:  Mildly echogenic and heterogeneous, with some improvement.  IVC:  Appears normal.  Pancreas:  No focal abnormality seen.  Spleen:  Normal, measuring 9.6 cm in length.  Right Kidney:  Normal in size, shape and echotexture, measuring 11.8 cm in length.  Previously demonstrated cyst containing a thin internal septation, currently measuring 7.9 cm in maximum diameter and previously measuring 9.8 cm in maximum diameter.  Left Kidney:  Normal in size, shape and echotexture, measuring 12.3 cm in length.  8.5 cm simple cyst, previously measuring 5.2 cm in maximum diameter.  Abdominal aorta:  The distal abdominal aorta and bifurcation were obscured by overlying bowel gas.  The visualized portions are normal in caliber.  IMPRESSION:  1.  No acute abnormality. 2.  Mild hepatic steatosis or hepatocellular disease with some improvement. 3.  Previously described bilateral renal cysts.  Original Report Authenticated By: Darrol Angel, M.D.   Dg Chest Portable 1 View  02/04/2012  *RADIOLOGY REPORT*  Clinical Data: Chest pain  PORTABLE CHEST - 1 VIEW  Comparison: 12/23/2011  Findings: COPD with hyperinflation of the lungs.  Small bilateral effusions and mild bibasilar atelectasis.  Vascularity normal.  No heart failure or edema is present.  IMPRESSION: Small bilateral  pleural effusions and mild bibasilar atelectasis without other signs of heart failure.  Original Report Authenticated By: Camelia Phenes, M.D.    ASSESMENT: 1.  Atypical chest pain.  No evidence of MI.  Cardiac cath on 01/20/12 with collateral filling of RCA lesion and moderate LAD and L Cfx disease.  Dr Elease Hashimoto "would not expect him to have ischemia based on that."   Plans medical mgt.  On heparin for A Fib.  Pain in xyphoid region may have been spasm, esophagogastritis.   He continues on once daily Protonix as he does at home.  2.  Anemia 3.  Thrombocytopenia. 4.  Primary biliary cirrhosis.   Liver bx 01/2001.   Takes ursodiol.   Fatty liver on ultrasound 01/2012 5.  Hyperplastic polyp of descending colon 06/2011.  Also noted was severe sig/descending diverticulosis and int rrhoids.  Last EGD was 10/2002: gastritis, possible portal gastropathy not confirmed on bx, but did have H Pylori present.    PLAN: 1.  EGD?  Given resolved sxs and ongoing Heparin drip will delay this for now.   2.  Awaiting reading of 2D echo.  3.  Can we allow PO's?  No plans for any further tests that can see.  He is still NPO   LOS: 2 days   Jennye Moccasin  02/06/2012, 10:05 AM Pager: [phone]

## 2012-02-06 NOTE — Progress Notes (Signed)
Chaplain's Note:  15:00 Visit with pt and wife per request from patient's church.  Pt minister visiting.  Offered pastoral presence and support and shared in prayer with minister.  Pt and wife appreciated chaplain's support.  Will follow-up as needed or requested.

## 2012-02-06 NOTE — Progress Notes (Signed)
PROGRESS NOTE  Subjective:   78 yo with CAD admitted with CP.  Pt has had an Inferior MI.  Recent cath shows an occluded RCA with collateral filling of the distal RCA. Moderate disease of the LAD and LCx  Cardiac enzymes are negative. Stress myoview last year revealed a previous Inferior MI.   Pt is currently getting echo.  Objective:    Vital Signs:   Temp:  [97.5 F (36.4 C)-98.9 F (37.2 C)] 97.5 F (36.4 C) (04/08 0645) Pulse Rate:  [83-98] 98  (04/08 0645) Resp:  [18-20] 19  (04/08 0645) BP: (110-122)/(57-70) 122/70 mmHg (04/08 0645) SpO2:  [96 %-98 %] 96 % (04/08 0645) Weight:  [159 lb 6.3 oz (72.3 kg)] 159 lb 6.3 oz (72.3 kg) (04/08 0645)  Last BM Date: 02/05/12   24-hour weight change: Weight change: -9.7 oz (-0.276 kg)  Weight trends: Filed Weights   02/04/12 1421 02/05/12 0150 02/06/12 0645  Weight: 160 lb (72.576 kg) 158 lb 11.7 oz (72 kg) 159 lb 6.3 oz (72.3 kg)    Intake/Output:  04/07 0701 - 04/08 0700 In: 778.2 [P.O.:720; I.V.:58.2] Out: 200 [Urine:200]     Physical Exam: BP 122/70  Pulse 98  Temp(Src) 97.5 F (36.4 C) (Oral)  Resp 19  Ht 5\' 3"  (1.6 m)  Wt 159 lb 6.3 oz (72.3 kg)  BMI 28.24 kg/m2  SpO2 96%    Unable to exam pt - in Echo lab   Labs: BMET:  [facility] 02/05/12 0455 02/04/12 1708  NA 138 137  K 3.5 3.6  CL 103 102  CO2 26 25  GLUCOSE 94 107*  BUN 27* 25*  CREATININE 1.18 1.25  CALCIUM 8.1* 8.5  MG 1.8 --  PHOS 3.1 --    Liver function tests:  Basename 02/05/12 0455  AST 13  ALT 14  ALKPHOS 74  BILITOT 0.9  PROT 6.4  ALBUMIN 3.3*   No results found for this basename: LIPASE:2,AMYLASE:2 in the last 72 hours  CBC:  Basename 02/05/12 0455 02/04/12 1708  WBC 10.1 13.9*  NEUTROABS -- 12.5*  HGB 10.8* 11.7*  HCT 32.4* 34.5*  MCV 96.7 94.5  PLT 112* 116*    Cardiac Enzymes:  Basename 02/05/12 2114 02/05/12 1256 02/05/12 0455 02/04/12 2127  CKTOTAL 105 85 71 72  CKMB 2.4 2.4 1.9 1.7  TROPONINI  <0.30 <0.30 <0.30 <0.30    Coagulation Studies:  Basename 02/04/12 1708  LABPROT 16.6*  INR 1.32    Other: No components found with this basename: POCBNP:3 No results found for this basename: DDIMER in the last 72 hours  Basename 02/05/12 0455  HGBA1C 5.5    Basename 02/05/12 0455  CHOL 117  HDL 45  LDLCALC 60  TRIG 60  CHOLHDL 2.6    Basename 02/05/12 0455  TSH 2.663  T4TOTAL --  T3FREE --  THYROIDAB --   No results found for this basename: VITAMINB12,FOLATE,FERRITIN,TIBC,IRON,RETICCTPCT in the last 72 hours    Medications:    Infusions:    . heparin 1,300 Units/hr (02/06/12 0131)    Scheduled Medications:    . antiseptic oral rinse  15 mL Mouth Rinse BID  . aspirin  81 mg Oral Daily  . docusate sodium  100 mg Oral BID  . doxazosin  4 mg Oral QHS  . heparin  1,000 Units Intravenous Once  . heparin  2,000 Units Intravenous Once  . insulin aspart  0-15 Units Subcutaneous TID WC  . insulin aspart  0-5 Units Subcutaneous  QHS  . isosorbide mononitrate  30 mg Oral Daily  . metoprolol tartrate  12.5 mg Oral BID  . pantoprazole  40 mg Oral Q1200  . simvastatin  40 mg Oral QHS  . sodium chloride  3 mL Intravenous Q12H  . sodium chloride  3 mL Intravenous Q12H  . ursodiol  300 mg Oral BID    Assessment/ Plan:    1. CP:  Pt has stable CAD - just had cath.  I would expect that he could be treated medically for his CAD.   The mid RCA stenosis is of little significance since the RCA is occluded shortly after the stenosis and the distal RCA fills via collaterals.  I would not expect him to have ischemia based on that.  He now has been found to have A-Fib.  Continue with anticoagulation and rate control   Disposition:  Length of Stay: 2  Alvia Grove., MD, [facility] 02/06/2012, 8:21 AM

## 2012-02-06 NOTE — Progress Notes (Signed)
Daily Progress Note Cory Lawrence. Cory Lawrence, M.D., M.B.A  Family Medicine PGY-1 Pager [phone]  Subjective: Patient denies chest pain. Previously sub-xyphoid and related to painful outpouching of abdomen in that area Also, denies shortness of breath currently; states that he does not have an oxygen requirement at home.   Patient currently on 2L O2 via n/c   Objective: Vital signs in last 24 hours: Temp:  [97.5 F (36.4 C)-98.9 F (37.2 C)] 97.5 F (36.4 C) (04/08 0645) Pulse Rate:  [83-98] 98  (04/08 0645) Resp:  [18-20] 19  (04/08 0645) BP: (110-122)/(57-70) 122/70 mmHg (04/08 0645) SpO2:  [96 %-98 %] 96 % (04/08 0645) Weight:  [159 lb 6.3 oz (72.3 kg)] 159 lb 6.3 oz (72.3 kg) (04/08 0645) Weight change: -9.7 oz (-0.276 kg) Last BM Date: 02/05/12  Intake/Output from previous day: 04/07 0701 - 04/08 0700 In: 778.2 [P.O.:720; I.V.:58.2] Out: 200 [Urine:200] Intake/Output this shift:    Gen: alert, pleasant, non-ill appearing, appears stated age HEENT: OP clear and moist, PERRLA, EOMI Cardiac: distant heart sounds Lungs: very poor air movement throughout lungs, mild respiratory wheezes, no increased WOB Abd: soft, non-tender under the xyphoid, no palpable masses , NABS  Lab Results:  Basename 02/05/12 0455 02/04/12 1708  WBC 10.1 13.9*  HGB 10.8* 11.7*  HCT 32.4* 34.5*  PLT 112* 116*   BMET  Basename 02/05/12 0455 02/04/12 1708  NA 138 137  K 3.5 3.6  CL 103 102  CO2 26 25  GLUCOSE 94 107*  BUN 27* 25*  CREATININE 1.18 1.25  CALCIUM 8.1* 8.5   CBG (last 3)   Basename 02/06/12 0641 02/05/12 2144 02/05/12 1608  GLUCAP 97 112* 136*     Studies/Results: Echo: EF 50%, mild inferior-lateral wall hypokinesis  Abdominal U/S - mild steatosis   Medications: I have reviewed the patient's current medications.  Assessment/Plan:  Cory Lawrence is a 78 y.o. male patient with PMHx of CAD, DM, HTN, HLD,, GERD, Hospital day 2 for resolved sub-xyphoid pain that  is likely GI in origin.    1) CV: Chest pain- patient with known diffuse CAD, with distal occlusion of RCA. He received nitro in ED and was started on heparin ggt and imdur upon admission, with improvement in symptoms. Cardiac enzymes have been negative and EKG unchanged. Echo without significant findings  Cardiology Consultation 4/8 - expects medical mgt of chest pain since CAD stable; also concerned about a-fib and encourage anti-coagulation and reate control, will continue to follow  Afib - CHADS-2 Score = 3, stop heparin since no ACS, start rivaroxaban for anti-coagulation, metoprolol for rate control  HTN- Continue Cardura, lopressor  HLD- continue zocor   2) GI-  U/s shows mild steatosis, but should not be painful; Now the - continue pantoprazole - f/u with GI to see if scope can be performed with patient on rivaroxaban.    3) DM- CBG's have been well controlled on SSI.   4) COPD - still on 2L n/c; start scheduled advair   - must walk patient today to see if home O2 needed  5) FEN - advance diet to carb modified   6) Dispo - given cardiac rule-out and minimal pain, may be able to d/c with outpatient GI follow-up     LOS: 2 days   [name] 02/06/2012, 8:42 AM

## 2012-02-06 NOTE — Progress Notes (Signed)
I have taken an interval history, reviewed the chart and examined the patient. Consider EGD to further evaluate atypical chest pain when off IV heparin and cleared by Cardiology. I agree with the extender's note, impression and recommendations.   Venita Lick. Russella Dar MD Clementeen Graham

## 2012-02-06 NOTE — Progress Notes (Signed)
FMTS Attending Note  Patient seen and examined by me, discussed with Dr. [name] and I agree with his plan as documented.  Patient to be started on oral anticoagulation with rivaroxaban and metoprolol for stroke risk reduction and rate control of AF. Will restart Advair as a twice-daily medication (patient had previously been using "as needed").  If no further interventions in inpatient setting, patient may be nearing discharge with outpatient follow-up.  Paula Compton, MD

## 2012-02-06 NOTE — Progress Notes (Signed)
Pt O2 sats of oxygen in room were 94 %.  Pt ambulated down hallway. Pt sats dropped to 84% without SOB.  Returned to room and replaced O2 at [employer].  Sats now 94%.  Pt states this is the first long walk he has had since admission.  He has only walked in room prior to this.  Will continue to monitor.  Call bell within reach. Thomas Hoff

## 2012-02-06 NOTE — Progress Notes (Signed)
  Echocardiogram 2D Echocardiogram has been performed.  Dorena Cookey 02/06/2012, 9:09 AM

## 2012-02-06 NOTE — Progress Notes (Signed)
PHARMACY - ANTICOAGULATION CONSULT Follow-Up NOTE  Pharmacy Consult for: Heparin>>Xarelto  Indication: R/o afib    Patient Data:   Allergies: Allergies  Allergen Reactions  . Penicillins Hives    Patient Measurements: Height: 5\' 3"  (160 cm) Weight: 159 lb 6.3 oz (72.3 kg) IBW/kg (Calculated) : 56.9  Heparin dosing weight: 71 kg  Vital Signs: Temp:  [97.5 F (36.4 C)-98.9 F (37.2 C)] 97.5 F (36.4 C) (04/08 0645) Pulse Rate:  [83-98] 98  (04/08 0645) Resp:  [18-20] 19  (04/08 0645) BP: (110-122)/(57-70) 122/70 mmHg (04/08 0645) SpO2:  [96 %-98 %] 96 % (04/08 0645) Weight:  [159 lb 6.3 oz (72.3 kg)] 159 lb 6.3 oz (72.3 kg) (04/08 0645)  Intake/Output from previous day:  Intake/Output Summary (Last 24 hours) at 02/06/12 1136 Last data filed at 02/06/12 0730  Gross per 24 hour  Intake 538.23 ml  Output    200 ml  Net 338.23 ml    Labs:  Basename 02/06/12 1015 02/06/12 0031 02/05/12 2114 02/05/12 1535 02/05/12 1256 02/05/12 0455 02/04/12 1708  HGB 10.5* -- -- -- -- 10.8* --  HCT 31.6* -- -- -- -- 32.4* 34.5*  PLT 95* -- -- -- -- 112* 116*  APTT -- -- -- -- -- -- 34  LABPROT -- -- -- -- -- -- 16.6*  INR -- -- -- -- -- -- 1.32  HEPARINUNFRC 0.43 0.23* -- 0.10* -- -- --  CREATININE -- -- -- -- -- 1.18 1.25  CKTOTAL -- -- 105 -- 85 71 --  CKMB -- -- 2.4 -- 2.4 1.9 --  TROPONINI -- -- <0.30 -- <0.30 <0.30 --   Estimated Creatinine Clearance: 41.6 ml/min (by C-G formula based on Cr of 1.18).  Scheduled medications:    Assessment:  78 y.o. male admitted on 02/04/2012, with chest pain. His chest pain is unlikely related to acs and cardiology recommends medical management. Follow up heparin level is at goal without bleeding complications Patient is having atrial fibrillation and therapy is being changed to rivaroxaban at reduced dosing of 15mg  daily.   I have discussed this new medication with the patient going over s/s of bleeding (he is very familiar given hx  gib/ulcers) dosing, when to take dose(evening meals) and need for evaluation if he suffers from head injury.  Goal of Therapy:  1. Heparin level 0.3-0.7 units/ml  Plan:  1. D/c heparin drip now - done ~1140 2. Start rivaroxaban with lunch then adjust timing to with evening meal 02/07/12 3. Education complete  11:42 AM 02/06/2012 Sheppard Coil PharmD., BCPS

## 2012-02-07 DIAGNOSIS — K7689 Other specified diseases of liver: Secondary | ICD-10-CM

## 2012-02-07 LAB — GLUCOSE, CAPILLARY
Glucose-Capillary: 97 mg/dL (ref 70–99)
Glucose-Capillary: 109 mg/dL — ABNORMAL HIGH (ref 70–99)

## 2012-02-07 MED ORDER — RIVAROXABAN 15 MG PO TABS: 15.0000 mg | ORAL_TABLET | Freq: Every day | ORAL | Status: DC

## 2012-02-07 MED ORDER — ISOSORBIDE MONONITRATE ER 30 MG PO TB24: 30.0000 mg | ORAL_TABLET | Freq: Every day | ORAL | Status: DC

## 2012-02-07 NOTE — Progress Notes (Signed)
[facility] Daily Rounding Note 02/07/2012, 8:40 AM  SUBJECTIVE:       No further epigastric pain.  One BM yesterday.  No n/v.  Tolerating solids.   OBJECTIVE:        General:  Looks tired, not acutely ill looking or in distress    Vital signs in last 24 hours:    Temp:  [97.8 F (36.6 C)-98 F (36.7 C)] 97.9 F (36.6 C) (04/09 0503) Pulse Rate:  [58-74] 74  (04/09 0503) Resp:  [18-20] 20  (04/09 0503) BP: (92-127)/(34-63) 92/34 mmHg (04/09 0503) SpO2:  [98 %-100 %] 98 % (04/09 0503) Last BM Date: 02/06/12  Heart: Irreg, rate in 70s.  Chest: Exp wheezes.  Decreased BS globally.  Breathing not labored.  Abdomen: Soft, NT, ND.  No masses.  BS active.   Extremities: Bruise on right thigh. No pedal edema.  Neuro/Psych:  Pleasant, not agitated or depressed.   Intake/Output from previous day: 04/08 0701 - 04/09 0700 In: 243 [P.O.:240; I.V.:3] Out: 1175 [Urine:1175]  Intake/Output this shift:    Lab Results:  Basename 02/06/12 1015 02/05/12 0455 02/04/12 1708  WBC 8.5 10.1 13.9*  HGB 10.5* 10.8* 11.7*  HCT 31.6* 32.4* 34.5*  PLT 95* 112* 116*   BMET  Basename 02/05/12 0455 02/04/12 1708  NA 138 137  K 3.5 3.6  CL 103 102  CO2 26 25  GLUCOSE 94 107*  BUN 27* 25*  CREATININE 1.18 1.25  CALCIUM 8.1* 8.5   LFT  Basename 02/05/12 0455  PROT 6.4  ALBUMIN 3.3*  AST 13  ALT 14  ALKPHOS 74  BILITOT 0.9  BILIDIR --  IBILI --   PT/INR  Basename 02/04/12 1708  LABPROT 16.6*  INR 1.32   Hepatitis Panel No results found for this basename: HEPBSAG,HCVAB,HEPAIGM,HEPBIGM in the last 72 hours  Studies/Results: US Abdomen Complete  02/06/2012  *RADIOLOGY REPORT*  Clinical Data:  Postprandial nausea.  COMPLETE ABDOMINAL ULTRASOUND  Comparison:  09/21/2007.  Findings:  Gallbladder:  No gallstones, gallbladder wall thickening, or pericholecystic fluid.  Common bile duct:  Normal, measuring 5.7 mm in diameter proximally.  Liver:  Mildly echogenic and heterogeneous,  with some improvement.  IVC:  Appears normal.  Pancreas:  No focal abnormality seen.  Spleen:  Normal, measuring 9.6 cm in length.  Right Kidney:  Normal in size, shape and echotexture, measuring 11.8 cm in length.  Previously demonstrated cyst containing a thin internal septation, currently measuring 7.9 cm in maximum diameter and previously measuring 9.8 cm in maximum diameter.  Left Kidney:  Normal in size, shape and echotexture, measuring 12.3 cm in length.  8.5 cm simple cyst, previously measuring 5.2 cm in maximum diameter.  Abdominal aorta:  The distal abdominal aorta and bifurcation were obscured by overlying bowel gas.  The visualized portions are normal in caliber.  IMPRESSION:  1.  No acute abnormality. 2.  Mild hepatic steatosis or hepatocellular disease with some improvement. 3.  Previously described bilateral renal cysts.  Original Report Authenticated By: Darrol Angel, M.D.    ASSESMENT: 1. Atypical chest/subxyphoid pain, resolved. No evidence of MI. Cardiac cath on 01/20/12 with collateral filling of RCA lesion and moderate LAD and L Cfx disease. Dr Elease Hashimoto "would not expect him to have ischemia based on that." Plans medical mgt. Now on Xarelto for A Fib.  Pain in xyphoid region may have been spasm, esophagogastritis. He continues on once daily Protonix as he does at home.  2. Anemia  3. Thrombocytopenia.  4. Primary biliary cirrhosis. Liver bx 01/2001. Takes ursodiol.  Fatty liver on ultrasound 01/2012  5. Hyperplastic polyp of descending colon 06/2011. Also noted was severe sig/descending diverticulosis and int rrhoids.  Last EGD was 10/2002: gastritis, possible portal gastropathy not confirmed on bx, but did have H Pylori present.    PLAN: 1.  EGD?, timing?  Given sustained resolution of sxs, less inclined to do EGD   LOS: 3 days   Jennye Moccasin  02/07/2012, 8:40 AM Pager: [phone]

## 2012-02-07 NOTE — Progress Notes (Signed)
FMTS Attending Note Patient seen and examined by me, I agree with with Dr Yetta Numbers assessment and plan as outlined above.  Patient not having further discomfort.  Is asymptomatic despite O2 desats with ambulation.  Plan for discharge to home with anticoagulation Xarelto for newly identified AF, supplemental oxygen and follow up with PCP Dr. Cleta Alberts in the office.  Decision regarding endoscopy as outpatient per GI recommendations.  Paula Compton, MD

## 2012-02-07 NOTE — Progress Notes (Signed)
Daily Progress Note [name]. Cory Lawrence, M.D., M.B.A  Family Medicine PGY-1 Pager [phone]  Subjective: Patient denies chest pain, abdominal pain, and shortness of breath. He has runny nose that is irritating his throat.  Bowel movement x 1 yesterday, no nausea or vomiting.  Denies dizziness.   Patient currently on 2L O2 via n/c   Objective: Vital signs in last 24 hours: Temp:  [97.8 F (36.6 C)-98 F (36.7 C)] 97.9 F (36.6 C) (04/09 0503) Pulse Rate:  [58-74] 74  (04/09 0503) Resp:  [18-20] 20  (04/09 0503) BP: (92-127)/(34-63) 92/34 mmHg (04/09 0503) SpO2:  [98 %-100 %] 98 % (04/09 0922) Weight change:  Last BM Date: 02/06/12  Intake/Output from previous day: 04/08 0701 - 04/09 0700 In: 243 [P.O.:240; I.V.:3] Out: 1175 [Urine:1175] Intake/Output this shift: Total I/O In: -  Out: 175 [Urine:175]  Gen: alert, pleasant, non-ill appearing, appears stated age HEENT: OP clear and moist, PERRLA, EOMI Cardiac: distant heart sounds, pulse with irregular rhythm  Lungs: very poor air movement throughout lungs, mild respiratory wheezes, no increased WOB Abd: soft, non-tender under the xyphoid, no palpable masses , NABS Extremities: large ecchymosis in right medial thigh (pts states from cardiac cath in March); 0.5 cm bullae on left tibia  Lab Results:  Basename 02/06/12 1015 02/05/12 0455  WBC 8.5 10.1  HGB 10.5* 10.8*  HCT 31.6* 32.4*  PLT 95* 112*   BMET  Basename 02/05/12 0455 02/04/12 1708  NA 138 137  K 3.5 3.6  CL 103 102  CO2 26 25  GLUCOSE 94 107*  BUN 27* 25*  CREATININE 1.18 1.25  CALCIUM 8.1* 8.5   CBG (last 3)   Basename 02/07/12 0605 02/06/12 2105 02/06/12 1620  GLUCAP 97 98 106*     Studies/Results: Echo: EF 50%, mild inferior-lateral wall hypokinesis  Abdominal U/S - mild steatosis   Medications: I have reviewed the patient's current medications.  Assessment/Plan:  Cory Lawrence is a 78 y.o. male patient with PMHx of CAD, DM,  HTN, HLD,, GERD. He presented with chest/upper abdominal pain that has resolved. It has been determined that is was not cardiac in origin, but patient now has atrial fibrillation.   1) CV: Chest pain- patient with known diffuse CAD, with distal occlusion of RCA. He received nitro in ED and was started on heparin ggt and imdur upon admission, with improvement in symptoms. Cardiac enzymes have been negative and EKG unchanged. Echo without significant findings  -Atrial Fibrillation: persistent overnight, noted on EGK and telemetr; Started Rivaroxaban 4/8 for anti-coagulation, also on metoprolol for rate control  - Hypotension, asymptomatic: 92/34 this AM; consider decreasing Cardura   2) GI-  U/s shows mild steatosis, but should not be painful;  - continue pantoprazole - Given patient is asymptomatic, likely candidate for outpatient GI evaluation and scope if still an candidate given that he is on Xarelto; Given risk for thromboembolic event for stroke 2/2 persistent atrial fibrillation, it is important to continue anti-coagulation   3) DM- CBG's have been well controlled on SSI.   4) COPD - still on 2L n/c; start scheduled advair - Patient Desaturation to 84% with ambulation off of O2, will need O2 at home   5) FEN - diet carb modified   6) Dispo - given cardiac rule-out and minimal pain, may be able to d/c with outpatient GI follow-up     LOS: 3 days   [name] 02/07/2012, 9:56 AM

## 2012-02-07 NOTE — Discharge Instructions (Signed)
Angina  Angina is discomfort caused by inadequate oxygen delivery to the heart muscle. Angina is a response to blockage or narrowing of the coronary arteries. It alerts you about a blood flow problem to your heart. New, more frequent, or severe angina is a warning signal for you to seek medical care right away.   CAUSES  The coronary arteries supply blood and oxygen to the heart muscle. The heart muscle needs a constant supply of oxygen in order to pump blood to the body. These arteries often become blocked by hardened deposits of blood products (plaque) including fat and cholesterol. The following contribute to the formation of plaque:   High cholesterol.   High blood pressure.   Smoking.   Obesity.   Diabetes.  SYMPTOMS    The most common problem is a deep discomfort in the center of your chest. The discomfort may also be felt in, or move to your:   Arms (especially the left one).   Throat.   Jaw.   Back.   Upper stomach.   Angina may be brought on by exercise, emotional upset, heavy meals, or extremes of heat or cold.   It may resolve within 5 to 10 minutes after a period of rest.   Angina symptoms vary from person to person.   If you have angina and it is treated, it may resolve. If you feel it again, the symptoms may not be the same in both type and location.  DIAGNOSIS    Emergency room evaluation or hospital admission may be needed to determine if there are any blockages of your coronary arteries.   Blood tests, EKGs, and chest X-rays may be done.   Further testing may include a stress test or an angiogram.   A heart specialist (cardiologist) may be asked to assist with your evaluation.   Other conditions that may feel like angina, but are not a problem with the heart include:   Muscle strain in the chest wall.   Blood clots in the lung.   Anxiety.   Acid reflux from the stomach.  RISKS AND COMPLICATIONS   Angina that is not treated or evaluated can lead to a decline in oxygen delivery  to the heart muscle, a heart attack, or even death.  TREATMENT   Depending on severity of the blockages and on other factors, angina may be treated with:   Lifestyle changes such as weight loss, stopping smoking, appropriate exercise, or low cholesterol and low salt diet.   Medications to control or to treat the risk factors for angina.   Procedures such as angioplasty or stent placement may allow the blockages to be opened without surgery.   Open heart surgery may be needed to bypass blocked arteries that cannot be treated by other methods.  HOME CARE INSTRUCTIONS    If your caregiver prescribed medication to control your angina, take them as directed. Report side effects. Do not stop medications or adjust the dosages on your own.   Regular exercise is good for you as long as it does not cause discomfort. Avoid activities that trigger attacks of angina. Walking is the best exercise. Do not begin any new type of exercise until you check with your caregiver.   You may still have a sexual relationship if it does not cause angina. Tell your caregiver if it does.   Stop smoking. Do not use nicotine patches or gum until you check with your caregiver.   If you are overweight, you should lose   weight. Eat a heart healthy diet that is low in fat and salt.   If your caregiver has given you a follow-up appointment, it is very important to keep that appointment. Not keeping the appointment could result in a heart attack, permanent heart damage, and disability. If there is any problem keeping the appointment, you must call back to this facility for assistance.  SEEK MEDICAL CARE IF:    Your angina seems to be occurring more frequently or seems to be lasting longer.   You are having problems that you think may be side effects of the medicine you are taking.  SEEK IMMEDIATE MEDICAL CARE IF:    You have severe chest discomfort, especially if the pain is crushing or pressure-like and spreads to the arms, back, neck, or  jaw.   You are sweating, feel sick to your stomach (nauseous), or have shortness of breath.   You have an attack of angina that does not get better after rest or taking your usual medicine.   You wake from sleep with chest pain.   You feel dizzy, faint, or experience extreme fatigue.   You have chest pain not typical of your usual angina. THIS IS AN EMERGENCY. Do not wait to see if the pain will go away. Get medical help at once. Call 911. DO NOT drive yourself to the hospital.  MAKE SURE YOU:    Understand these instructions.   Will watch your condition.   Will get help right away if you are not doing well or get worse.  Document Released: 10/17/2005 Document Revised: 10/06/2011 Document Reviewed: 05/20/2008  [employer] Patient Information 2012 [employer].

## 2012-02-07 NOTE — Progress Notes (Signed)
Pt's rhythm converted to afib @ 0046. EKG assessed.AFIB @ 77. Patient asymptomatic.B/P 123/67. Patient asleep before Rn awoke pt to perform EKG. EKG & tele strip placed in shadow chart. Will continue to monitor.  Orpheus Hayhurst,Rn,BSN

## 2012-02-07 NOTE — Progress Notes (Signed)
Pt oxygen saturation checked on room air and during and after ambulation.  Pt 94 % on room air prior to ambulation. Pt ambulated 500 feet with sats remaining 92-94% without O2.  Sats dropped once to 90% and returned to 94%.  Pt had no SOB or distress.  Wife concerned that his "oxygen would drop at home because of his COPD".  I explained that he had walked without any distress.  She said she would purchase a pulse ox for when he went home.  Call bell within reach.  Will continue to monitor and call Family Practice. Thomas Hoff

## 2012-02-07 NOTE — Discharge Summary (Signed)
Physician Discharge Summary  Patient ID: Cory Lawrence MRN: [id number] DOB/AGE: 12-27-1924 78 y.o.  Admit date: 02/04/2012 Discharge date: 02/07/2012  PCP: Earl Lites MD.   Admission Diagnoses: Chest Pain  Discharge Diagnoses:  Active Problems:  GERD  Chest pain  COPD (chronic obstructive pulmonary disease)  Hypertension  CAD (coronary artery disease)  NASH (nonalcoholic steatohepatitis)  Diabetes mellitus type 2, diet-controlled   Discharged Condition: stable  Hospital Course:  Mr. Prude is an 78 year old man with known CAD s/p cardiac catheterization in March that showed midl RCA occlusion who presented with chest pain. The pain was relieved with nitro, but was not demonstrated to be cardiac. His troponin I was negative x 5. The patient developed atrial fibrillation in the hospital. Therefore, he was started on Rivaroxaban for anticoagulation. His rate was well controlled on Metoprolol.   There was a belief that the pain could be GI related since it was located in the sub-xyphoid area. The GI consultants considered an EGD to look for strictures or ulcer disease, but deferred to do so given his anti-coagulation. They did however perform an abdominal ultrasound that demonstrated mild steatosis. No other work-up was performed since the pain resolved.   Lastly, the patient had a COPD exacerbation that required O2 treatment. Also, Advair was started as a scheduled medication. On the day of discharge, he did not desaturate with ambulation, so he was not discharged on O2.   Consults: cardiology, gastroenterology   Significant Diagnostic Studies:  Cardiac Enzymes:  Lab 02/05/12 2114 02/05/12 1256 02/05/12 0455 02/04/12 2127 02/04/12 1708  CKTOTAL 105 85 71 72 --  CKMB 2.4 2.4 1.9 1.7 --  CKMBINDEX -- -- -- -- --  TROPONINI <0.30 <0.30 <0.30 <0.30 <0.30   Echo: EF 50%, mild inferior-lateral wall hypokinesis   Discharge Exam: Blood pressure 98/53, pulse 97, temperature  98.3 F (36.8 C), temperature source Oral, resp. rate 19, height 5\' 3"  (1.6 m), weight 159 lb 6.3 oz (72.3 kg), SpO2 100.00%. Gen: alert, pleasant, non-ill appearing, appears stated age  HEENT: OP clear and moist, PERRLA, EOMI  Cardiac: distant heart sounds, pulse with irregular rhythm  Lungs: very poor air movement throughout lungs, mild respiratory wheezes, no increased WOB  Abd: soft, non-tender under the xyphoid, no palpable masses , NABS  Extremities: large ecchymosis in right medial thigh (pts states from cardiac cath in March); 0.5 cm bullae on left tibia   Disposition: 01-Home or Self Care  Discharge Orders    Future Appointments: Provider: Department: Dept Phone: Center:   02/28/2012 9:15 AM Collene Gobble, MD [facility] [phone] [facility]     Future Orders Please Complete By Expires   Diet - low sodium heart healthy      Increase activity slowly      Call MD for:  temperature >100.4      Call MD for:  persistant nausea and vomiting      Call MD for:  severe uncontrolled pain      Call MD for:  difficulty breathing, headache or visual disturbances      Call MD for:  persistant dizziness or light-headedness      Call MD for:  extreme fatigue      (HEART FAILURE PATIENTS) Call MD:  Anytime you have any of the following symptoms: 1) 3 pound weight gain in 24 hours or 5 pounds in 1 week 2) shortness of breath, with or without a dry hacking cough 3) swelling in the hands,  feet or stomach 4) if you have to sleep on extra pillows at night in order to breathe.        Medication List  As of 02/07/2012  6:13 PM   TAKE these medications         ADVAIR DISKUS 250-50 MCG/DOSE Aepb   Generic drug: Fluticasone-Salmeterol   Inhale 1 puff into the lungs as needed. Shortness of breath.      ALPRAZolam 0.5 MG tablet   Commonly known as: XANAX   Take 1 tablet by mouth at bedtime as needed.      aspirin 81 MG tablet   Take 81 mg by mouth daily.      COMBIVENT IN   Inhale 1 puff  into the lungs as needed. Shortness of breath.      cyanocobalamin 1000 MCG/ML injection   Commonly known as: (VITAMIN B-12)   Inject 1 mL (1,000 mcg total) into the muscle every 30 (thirty) days.      doxazosin 4 MG tablet   Commonly known as: CARDURA   Take 4 mg by mouth at bedtime.      Fish Oil 1000 MG Caps   Take 1,000 mg by mouth daily.      isosorbide mononitrate 30 MG 24 hr tablet   Commonly known as: IMDUR   Take 1 tablet (30 mg total) by mouth daily.      Melatonin 3 MG Caps   Take 3 mg by mouth at bedtime as needed. Sleep        multivitamin tablet   Take 1 tablet by mouth daily.      pantoprazole 40 MG tablet   Commonly known as: PROTONIX   Take 40 mg by mouth daily with breakfast.      Rivaroxaban 15 MG Tabs tablet   Commonly known as: XARELTO   Take 1 tablet (15 mg total) by mouth daily with supper.      simvastatin 40 MG tablet   Commonly known as: ZOCOR   Take 40 mg by mouth at bedtime.      triamcinolone cream 0.1 %   Commonly known as: KENALOG   Apply 1 application topically daily. Apply to ankles.      ursodiol 250 MG tablet   Commonly known as: ACTIGALL   Take 1 tablet (250 mg total) by mouth 2 (two) times daily.      vitamin E 400 UNIT capsule   Take 400 Units by mouth daily.           Follow-up Information    Follow up with [name], STEVE A, MD. Schedule an appointment as soon as possible for a visit in 1 week.      Follow up with Norma Fredrickson, NP. Schedule an appointment as soon as possible for a visit in 2 weeks.   Contact information:   [address], [address] [place] [address] [phone]       Follow up with [name]., MD. Schedule an appointment as soon as possible for a visit in 6 weeks.   Contact information:   [address] [place] [address] [phone]          Signed: [name] 02/07/2012, 6:13 PM

## 2012-02-07 NOTE — Progress Notes (Signed)
I have taken an interval history, reviewed the chart and examined the patient. I agree with the extender's note, impression and recommendations. Xiphoid area,/epigastric pain has resolved. No plans for EGD at this time-possible GERD, gastritis causing pain but not entirely clear this was a GI problem. Continue PPI as outpatient and follow up with Dr. Sheryn Bison in a few weeks.   Venita Lick. Russella Dar MD Clementeen Graham

## 2012-02-07 NOTE — Progress Notes (Signed)
   CARE MANAGEMENT NOTE 02/07/2012  Patient:  Cory Lawrence, Cory Lawrence   Account Number:  [id number]  Date Initiated:  02/07/2012  Documentation initiated by:  Johny Shock  Subjective/Objective Assessment:   Order for home oxygen     Action/Plan:   Pt does not meet criteria for home oxygen, discussed with MD.   Anticipated DC Date:  02/07/2012   Anticipated DC Plan:           Choice offered to / List presented to:             Status of service:  Completed, signed off Medicare Important Message given?   (If response is "NO", the following Medicare IM given date fields will be blank) Date Medicare IM given:   Date Additional Medicare IM given:    Discharge Disposition:  HOME/SELF CARE  Per UR Regulation:  Reviewed for med. necessity/level of care/duration of stay  If discussed at Long Length of Stay Meetings, dates discussed:    Comments:

## 2012-02-15 ENCOUNTER — Telehealth: Payer: Self-pay

## 2012-02-15 NOTE — Telephone Encounter (Signed)
Pt wife calling she is very concerned that her husband should be on oxygen, he was discharged from hospital last Wednesday she would like for Dr Cleta Alberts to contact her.

## 2012-02-22 ENCOUNTER — Ambulatory Visit (INDEPENDENT_AMBULATORY_CARE_PROVIDER_SITE_OTHER): Payer: Medicare Other | Admitting: Nurse Practitioner

## 2012-02-22 ENCOUNTER — Encounter: Payer: Self-pay | Admitting: Nurse Practitioner

## 2012-02-22 VITALS — BP 110/50 | HR 58 | Ht 65.0 in | Wt 158.0 lb

## 2012-02-22 DIAGNOSIS — I480 Paroxysmal atrial fibrillation: Secondary | ICD-10-CM | POA: Insufficient documentation

## 2012-02-22 DIAGNOSIS — I2510 Atherosclerotic heart disease of native coronary artery without angina pectoris: Secondary | ICD-10-CM

## 2012-02-22 DIAGNOSIS — I4891 Unspecified atrial fibrillation: Secondary | ICD-10-CM

## 2012-02-22 LAB — CBC WITH DIFFERENTIAL/PLATELET
Basophils Absolute: 0 10*3/uL (ref 0.0–0.1)
Basophils Relative: 0 % (ref 0.0–3.0)
Eosinophils Absolute: 0 10*3/uL (ref 0.0–0.7)
Eosinophils Relative: 0.3 % (ref 0.0–5.0)
HCT: 28.5 % — ABNORMAL LOW (ref 39.0–52.0)
Hemoglobin: 9.6 g/dL — ABNORMAL LOW (ref 13.0–17.0)
Lymphocytes Relative: 11.3 % — ABNORMAL LOW (ref 12.0–46.0)
Lymphs Abs: 0.9 10*3/uL (ref 0.7–4.0)
MCHC: 33.7 g/dL (ref 30.0–36.0)
MCV: 95.1 fl (ref 78.0–100.0)
Monocytes Absolute: 0.5 10*3/uL (ref 0.1–1.0)
Monocytes Relative: 5.5 % (ref 3.0–12.0)
Neutro Abs: 6.9 10*3/uL (ref 1.4–7.7)
Neutrophils Relative %: 82.9 % — ABNORMAL HIGH (ref 43.0–77.0)
Platelets: 246 10*3/uL (ref 150.0–400.0)
RBC: 3 Mil/uL — ABNORMAL LOW (ref 4.22–5.81)
RDW: 14.4 % (ref 11.5–14.6)
WBC: 8.4 10*3/uL (ref 4.5–10.5)

## 2012-02-22 LAB — BASIC METABOLIC PANEL
BUN: 35 mg/dL — ABNORMAL HIGH (ref 6–23)
CO2: 27 mEq/L (ref 19–32)
Calcium: 8.1 mg/dL — ABNORMAL LOW (ref 8.4–10.5)
Chloride: 101 mEq/L (ref 96–112)
Creatinine, Ser: 1.2 mg/dL (ref 0.4–1.5)
GFR: 59.45 mL/min — ABNORMAL LOW (ref >60.00)
Glucose, Bld: 101 mg/dL — ABNORMAL HIGH (ref 70–99)
Potassium: 4.5 mEq/L (ref 3.5–5.1)
Sodium: 135 mEq/L (ref 135–145)

## 2012-02-22 MED ORDER — DIGOXIN 125 MCG PO TABS
125.0000 ug | ORAL_TABLET | Freq: Every day | ORAL | Status: DC
Start: 2012-02-22 — End: 2013-02-15

## 2012-02-22 NOTE — Patient Instructions (Signed)
We are going to start Lanoxin 0.125 mg daily to help slow your heart rate down  Stay on your other medicines.  I will see him back in 10 days. Will plan on arranging for a cardioversion then  Call the [facility] office at [phone] if you have any questions, problems or concerns.

## 2012-02-22 NOTE — Progress Notes (Signed)
Cory Lawrence Date of Birth: 1925/07/27 Medical Record #[id number]  History of Present Illness: Cory Lawrence is seen back today for a post hospital visit. He is seen for Dr. Elease Hashimoto. He has a history of abnormal Myoview and he finally underwent cardiac cath showing CAD. He will be managed medically. Does have some mild LV dysfunction. His other issues include recent admission for chest pain and a COPD exacerbation. He was also noted to be in atrial fib during this admission earlier this month and was started on Xarelto. His rate was controlled with metoprolol. He was not sent home on Metoprolol. He had negative troponins and was felt that his chest pain was due to more of a GI process but the overall disposition is unclear to me. His other issues include HTN, COPD, HLD, GERD, BPH, NASH, DM and anxiety. He has had a history of ulcers and was anemic during this most recent admission.   He comes in today. He is here with his wife. He remains short of breath. Feels weak. Not doing much at home. Feels his heart racing. Some dizziness. It would appear to me that he probably showed up to the hospital in atrial fib and that it has been persistent. He does have an extensive GI history with prior ulcers. No reports of bleeding at this time but he was anemic at discharge.   Current Outpatient Prescriptions on File Prior to Visit  Medication Sig Dispense Refill  . ALPRAZolam (XANAX) 0.5 MG tablet Take 1 tablet by mouth at bedtime as needed.       Marland Kitchen aspirin 81 MG tablet Take 81 mg by mouth daily.        . cyanocobalamin (,VITAMIN B-12,) 1000 MCG/ML injection Inject 1 mL (1,000 mcg total) into the muscle every 30 (thirty) days.  10 mL  1  . doxazosin (CARDURA) 4 MG tablet Take 4 mg by mouth at bedtime.        . Fluticasone-Salmeterol (ADVAIR DISKUS) 250-50 MCG/DOSE AEPB Inhale 1 puff into the lungs as needed. Shortness of breath.      . Ipratropium-Albuterol (COMBIVENT IN) Inhale 1 puff into the lungs as  needed. Shortness of breath.      . isosorbide mononitrate (IMDUR) 30 MG 24 hr tablet Take 1 tablet (30 mg total) by mouth daily.  30 tablet  1  . Melatonin 3 MG CAPS Take 3 mg by mouth at bedtime as needed. Sleep       . Multiple Vitamin (MULTIVITAMIN) tablet Take 1 tablet by mouth daily.        . Omega-3 Fatty Acids (FISH OIL) 1000 MG CAPS Take 1,000 mg by mouth daily.       . pantoprazole (PROTONIX) 40 MG tablet Take 40 mg by mouth daily with breakfast.      . Rivaroxaban (XARELTO) 15 MG TABS tablet Take 1 tablet (15 mg total) by mouth daily with supper.  30 tablet  1  . simvastatin (ZOCOR) 40 MG tablet Take 40 mg by mouth at bedtime.        . triamcinolone (KENALOG) 0.1 % cream Apply 1 application topically daily. Apply to ankles.      . ursodiol (ACTIGALL) 250 MG tablet Take 1 tablet (250 mg total) by mouth 2 (two) times daily.  60 tablet  11  . vitamin E 400 UNIT capsule Take 400 Units by mouth daily.          Allergies  Allergen Reactions  . Penicillins Hives  Past Medical History  Diagnosis Date  . Dizziness   . Diaphoresis   . Palpitations   . COPD (chronic obstructive pulmonary disease)   . Hypertension   . Hyperlipidemia   . GERD (gastroesophageal reflux disease)   . BPH (benign prostatic hypertrophy)   . Personal history of colonic polyps 07/08/2002,06/2011    tubular adenoma, hyperplastic  . Internal hemorrhoids without mention of complication   . Diverticulosis of colon (without mention of hemorrhage)   . Biliary cirrhosis   . Unspecified gastritis and gastroduodenitis without mention of hemorrhage   . NASH (nonalcoholic steatohepatitis)   . Anxiety   . Arthritis   . Ulcer     Gastric/bleeding  . Diabetes mellitus type 2, diet-controlled     managed with diet  . Vitamin B12 deficiency   . PAF (paroxysmal atrial fibrillation)   . CAD (coronary artery disease)     prior abnormal Myoview; subsequent cath; managed medically    Past Surgical History  Procedure  Date  . Cataract extraction     bilateral  . Eye surgery     left eye x multiple  . Cardiac catheterization March 2013    Managed medically    History  Smoking status  . Former Smoker  Smokeless tobacco  . Former Neurosurgeon  . Quit date: 05/31/1996    History  Alcohol Use No    Family History  Problem Relation Age of Onset  . Heart failure Mother   . Diabetes Mother   . Diabetes Sister   . Heart failure Sister   . Colon cancer Neg Hx   . Cancer Mother     unknown type    Review of Systems: The review of systems is positive for.  All other systems were reviewed and are negative.  Physical Exam: BP 110/50  Pulse 58  Ht 5\' 5"  (1.651 m)  Wt 158 lb (71.668 kg)  BMI 26.29 kg/m2  SpO2 93% Patient appears chronically ill and in no acute distress. Skin is warm and dry. Color looks sallow to me.  HEENT is unremarkable. Normocephalic/atraumatic. PERRL. Sclera are nonicteric. Neck is supple. No masses. No JVD. Lungs show decreased breath sounds bilaterally. Cardiac exam shows an irregular rate and rhythm. He is faster on physical exam. Abdomen is soft. Extremities are without edema. Gait and ROM are intact. No gross neurologic deficits noted.   Laboratory Data:   Cardiac Cath Conclusions March 2013:   1. Diffuse coronary artery irregularities. The left main and LAD are moderately calcified. There are moderate stenosis in the LAD but no flow obstructing lesions. The right coronary artery is   occluded distally and the posterior descending artery fills via left right collaterals. This RCA occlusion is the etiology of the inferior wall defect seen on Myoview study.   The PDA is filled very well from there for left anterior descending artery and I do not think that revascularization of the right coronary artery is indicated. There is very poor flow and I do not think   that a stent would remain patent. We'll continue with medical therapy as needed.   2. Mildly depressed left ventricle  systolic function.    Echo Study Conclusions April 2013:   - Left ventricle: Technically difficult study. I think there  is some hypokinesis of the mid inferolateral segment. The  cavity size was normal. Wall thickness was normal. The  estimated ejection fraction was 50%.  - Aortic valve: Sclerosis without stenosis. Mild  regurgitation.  -  Left atrium: The atrium was mildly dilated.  - Right atrium: The atrium was mildly dilated.  - Pulmonary arteries: PA peak pressure: 38mm Hg (S).     Lab Results  Component Value Date   WBC 8.5 02/06/2012   HGB 10.5* 02/06/2012   HCT 31.6* 02/06/2012   PLT 95* 02/06/2012   GLUCOSE 94 02/05/2012   CHOL 117 02/05/2012   TRIG 60 02/05/2012   HDL 45 02/05/2012   LDLCALC 60 02/05/2012   ALT 14 02/05/2012   AST 13 02/05/2012   NA 138 02/05/2012   K 3.5 02/05/2012   CL 103 02/05/2012   CREATININE 1.18 02/05/2012   BUN 27* 02/05/2012   CO2 26 02/05/2012   TSH 2.663 02/05/2012   INR 1.32 02/04/2012   HGBA1C 5.5 02/05/2012   EKG today shows atrial fib. He is having PVC's as well.   Assessment / Plan:

## 2012-02-22 NOTE — Assessment & Plan Note (Addendum)
He remains in atrial fib. I suspect this is why he is not feeling good. He is on Xarelto. Rate is fast today on exam. He is not on the metoprolol and I cannot tell as to why. I have started low dose Digoxin at 0.125 mg daily. I will see him back in 10 days and will make plans for cardioversion at that time. We are rechecking his labs today as well. He does have a history of ulcers and will need to monitor this closely. I will ask Dr. Elease Hashimoto to review as well.   Phone call on 02/28/12 received from Dr. Cleta Alberts. He is seeing him today. He is in sinus there. Dr. Cleta Alberts informed me of all the stress Mr. Torrence has been under. Apparently his son killed his wife and has just been given a life sentence. This has been quite emotional for him. Mr. Sievers will keep his followup with [place] next week. We will not proceed with cardioversion.

## 2012-02-22 NOTE — Assessment & Plan Note (Signed)
Has had recent cath and will be managed medically. Mild LV dysfunction noted.

## 2012-02-23 ENCOUNTER — Other Ambulatory Visit: Payer: Self-pay | Admitting: Emergency Medicine

## 2012-02-24 ENCOUNTER — Other Ambulatory Visit: Payer: Self-pay | Admitting: Gastroenterology

## 2012-02-28 ENCOUNTER — Ambulatory Visit (INDEPENDENT_AMBULATORY_CARE_PROVIDER_SITE_OTHER): Payer: Medicare Other | Admitting: Emergency Medicine

## 2012-02-28 ENCOUNTER — Encounter: Payer: Self-pay | Admitting: Nurse Practitioner

## 2012-02-28 VITALS — BP 123/71 | HR 88 | Temp 97.0°F | Resp 16 | Ht 63.0 in | Wt 154.8 lb

## 2012-02-28 DIAGNOSIS — I4891 Unspecified atrial fibrillation: Secondary | ICD-10-CM

## 2012-02-28 DIAGNOSIS — J449 Chronic obstructive pulmonary disease, unspecified: Secondary | ICD-10-CM

## 2012-02-28 NOTE — Progress Notes (Signed)
  Subjective:    Patient ID: Cory Lawrence, male    DOB: Jul 26, 1925, 78 y.o.   MRN: [id number]  HPI patient enters for recheck after recent hospitalizations for heart related issues. He was found to be in atrial fibrillation and hypoxic he had catheterization which showed nonobstructive coronary disease and no intervention was planned. He later had to be rehospitalized. He was also found to have low hemoglobin and low platelet count. He enters today feeling better. Still in a great deal of stress related to his son's incarceration. He has not been able to visit him because he has been so ill    Review of Systems     Objective:   Physical Exam Cory Lawrence looks frail today. He is not in any distress. His chest was symmetrical expansion with diminished breath sounds in the bases. Cardiac exam is regular rate and rhythm without murmur   EKG normal sinus     Assessment & Plan:  Patient was having a recent flare of his COPD . I did refill his Combivent. I called the cardiology office and advise that he was now in sinus rhythm. We'll see him back in 6 weeks and his blood work at that time with attention to a CBC because he has been anemic with a low platelet count. Check a pulse ox while he was in the office and it stayed 97/98 with ambulation up and down the hall.

## 2012-03-02 ENCOUNTER — Ambulatory Visit (INDEPENDENT_AMBULATORY_CARE_PROVIDER_SITE_OTHER): Payer: Medicare Other | Admitting: Nurse Practitioner

## 2012-03-02 ENCOUNTER — Encounter: Payer: Self-pay | Admitting: Nurse Practitioner

## 2012-03-02 VITALS — BP 122/58 | HR 85 | Ht 65.0 in | Wt 156.0 lb

## 2012-03-02 DIAGNOSIS — I4891 Unspecified atrial fibrillation: Secondary | ICD-10-CM

## 2012-03-02 DIAGNOSIS — I480 Paroxysmal atrial fibrillation: Secondary | ICD-10-CM

## 2012-03-02 DIAGNOSIS — I2510 Atherosclerotic heart disease of native coronary artery without angina pectoris: Secondary | ICD-10-CM

## 2012-03-02 DIAGNOSIS — I519 Heart disease, unspecified: Secondary | ICD-10-CM

## 2012-03-02 LAB — CBC WITH DIFFERENTIAL/PLATELET
Basophils Absolute: 0 10*3/uL (ref 0.0–0.1)
Basophils Relative: 0.5 % (ref 0.0–3.0)
Eosinophils Absolute: 0.1 10*3/uL (ref 0.0–0.7)
Eosinophils Relative: 1 % (ref 0.0–5.0)
HCT: 28.2 % — ABNORMAL LOW (ref 39.0–52.0)
Hemoglobin: 9.2 g/dL — ABNORMAL LOW (ref 13.0–17.0)
Lymphocytes Relative: 22.2 % (ref 12.0–46.0)
Lymphs Abs: 1.4 10*3/uL (ref 0.7–4.0)
MCHC: 32.7 g/dL (ref 30.0–36.0)
MCV: 95.3 fl (ref 78.0–100.0)
Monocytes Absolute: 0.3 10*3/uL (ref 0.1–1.0)
Monocytes Relative: 5.7 % (ref 3.0–12.0)
Neutro Abs: 4.4 10*3/uL (ref 1.4–7.7)
Neutrophils Relative %: 70.6 % (ref 43.0–77.0)
Platelets: 238 10*3/uL (ref 150.0–400.0)
RBC: 2.96 Mil/uL — ABNORMAL LOW (ref 4.22–5.81)
RDW: 14.4 % (ref 11.5–14.6)
WBC: 6.2 10*3/uL (ref 4.5–10.5)

## 2012-03-02 LAB — BASIC METABOLIC PANEL
BUN: 23 mg/dL (ref 6–23)
CO2: 29 mEq/L (ref 19–32)
Calcium: 7.8 mg/dL — ABNORMAL LOW (ref 8.4–10.5)
Chloride: 103 mEq/L (ref 96–112)
Creatinine, Ser: 1.2 mg/dL (ref 0.4–1.5)
GFR: 64.25 mL/min (ref >60.00)
Glucose, Bld: 89 mg/dL (ref 70–99)
Potassium: 3.7 mEq/L (ref 3.5–5.1)
Sodium: 139 mEq/L (ref 135–145)

## 2012-03-02 NOTE — Assessment & Plan Note (Signed)
Was mild by cath. He is currently asymptomatic.

## 2012-03-02 NOTE — Assessment & Plan Note (Signed)
Has had recent cath and he will be managed medically. He is currently asymptomatic.

## 2012-03-02 NOTE — Assessment & Plan Note (Signed)
He is back in sinus rhythm. He is feeling so much better clinically. We are rechecking his labs today. While his risk of bleeding is high, so is his risk of stroke. I have left him on the Xarelto. Stopped his aspirin. We will see him back in a month. Patient is agreeable to this plan and will call if any problems develop in the interim.

## 2012-03-02 NOTE — Progress Notes (Signed)
Cory Lawrence Date of Birth: Jul 25, 1925 Medical Record #[id number]  History of Present Illness: Cory Lawrence is seen back today for a follow up visit. It is a 10 day check. He is seen for Dr. Elease Hashimoto. He has had a remote abnormal Myoview and has had recent cath showing CAD that will be managed medically. Does have some LV dysfunction. Has had recent episode of atrial fib and was back in the hospital. Xarelto was started. His other issues include HTN, COPD, HLD, GERD, BPH, NASH, DM and anxiety. He has had ulcers and is currently anemic. At his last visit I added Digoxin for rate control.  He comes in today. He is here with his wife. He says he is "200% better". He is not short of breath. No chest pain. Feels like mowing his grass. Not weak anymore. Saw Dr. Cleta Alberts earlier this week and was back in sinus. Romney has been under a lot of stress with a son who has committed murder and has been given a life sentence.  Current Outpatient Prescriptions on File Prior to Visit  Medication Sig Dispense Refill  . ALPRAZolam (XANAX) 0.5 MG tablet Take 1 tablet by mouth at bedtime as needed.       . cyanocobalamin (,VITAMIN B-12,) 1000 MCG/ML injection Inject 1 mL (1,000 mcg total) into the muscle every 30 (thirty) days.  10 mL  1  . digoxin (LANOXIN) 0.125 MG tablet Take 1 tablet (125 mcg total) by mouth daily.  30 tablet  11  . doxazosin (CARDURA) 4 MG tablet TAKE 1 TABLET EVERY DAY AT BEDTIME  30 tablet  0  . Fe-Succ Ac-C-Thre Ac-B12-FA (FERREX 150 FORTE PLUS) 50-100 MG CAPS Take one tablet by mouth once a day  30 each  3  . Fluticasone-Salmeterol (ADVAIR DISKUS) 250-50 MCG/DOSE AEPB Inhale 1 puff into the lungs as needed. Shortness of breath.      . Ipratropium-Albuterol (COMBIVENT IN) Inhale 1 puff into the lungs as needed. Shortness of breath.      . isosorbide mononitrate (IMDUR) 30 MG 24 hr tablet Take 1 tablet (30 mg total) by mouth daily.  30 tablet  1  . Melatonin 3 MG CAPS Take 3 mg by mouth at  bedtime as needed. Sleep       . Multiple Vitamin (MULTIVITAMIN) tablet Take 1 tablet by mouth daily.        . Omega-3 Fatty Acids (FISH OIL) 1000 MG CAPS Take 1,000 mg by mouth daily.       . pantoprazole (PROTONIX) 40 MG tablet Take 40 mg by mouth daily with breakfast.      . Rivaroxaban (XARELTO) 15 MG TABS tablet Take 1 tablet (15 mg total) by mouth daily with supper.  30 tablet  1  . simvastatin (ZOCOR) 40 MG tablet Take 10 mg by mouth at bedtime.       . triamcinolone (KENALOG) 0.1 % cream Apply 1 application topically daily. Apply to ankles.      . ursodiol (ACTIGALL) 250 MG tablet Take 1 tablet (250 mg total) by mouth 2 (two) times daily.  60 tablet  11  . vitamin E 400 UNIT capsule Take 400 Units by mouth daily.          Allergies  Allergen Reactions  . Penicillins Hives    Past Medical History  Diagnosis Date  . Dizziness   . Diaphoresis   . Palpitations   . COPD (chronic obstructive pulmonary disease)   . Hypertension   .  Hyperlipidemia   . GERD (gastroesophageal reflux disease)   . BPH (benign prostatic hypertrophy)   . Personal history of colonic polyps 07/08/2002,06/2011    tubular adenoma, hyperplastic  . Internal hemorrhoids without mention of complication   . Diverticulosis of colon (without mention of hemorrhage)   . Biliary cirrhosis   . Unspecified gastritis and gastroduodenitis without mention of hemorrhage   . NASH (nonalcoholic steatohepatitis)   . Anxiety   . Arthritis   . Ulcer     Gastric/bleeding  . Diabetes mellitus type 2, diet-controlled     managed with diet  . Vitamin B12 deficiency   . PAF (paroxysmal atrial fibrillation)     on Xarelto  . CAD (coronary artery disease)     prior abnormal Myoview; subsequent cath; managed medically    Past Surgical History  Procedure Date  . Cataract extraction     bilateral  . Eye surgery     left eye x multiple  . Cardiac catheterization March 2013    Managed medically    History  Smoking status   . Former Smoker  Smokeless tobacco  . Former Neurosurgeon  . Quit date: 05/31/1996    History  Alcohol Use No    Family History  Problem Relation Age of Onset  . Heart failure Mother   . Diabetes Mother   . Diabetes Sister   . Heart failure Sister   . Colon cancer Neg Hx   . Cancer Mother     unknown type    Review of Systems: The review of systems is per the HPI.  All other systems were reviewed and are negative.  Physical Exam: BP 122/58  Pulse 85  Ht 5\' 5"  (1.651 m)  Wt 156 lb (70.761 kg)  BMI 25.96 kg/m2 Patient is very pleasant and in no acute distress. He looks so much better today. Color has improved. He appears stronger. Skin is warm and dry. Color is normal.  HEENT is unremarkable. Normocephalic/atraumatic. PERRL. Sclera are nonicteric. Neck is supple. No masses. No JVD. Lungs are clear. Cardiac exam shows a regular rate and rhythm. Abdomen is soft. Extremities are without edema. Gait and ROM are intact. No gross neurologic deficits noted.   LABORATORY DATA: PENDING   EKG today shows sinus rhythm.  Lab Results  Component Value Date   WBC 8.4 02/22/2012   HGB 9.6* 02/22/2012   HCT 28.5* 02/22/2012   PLT 246.0 02/22/2012   GLUCOSE 101* 02/22/2012   CHOL 117 02/05/2012   TRIG 60 02/05/2012   HDL 45 02/05/2012   LDLCALC 60 02/05/2012   ALT 14 02/05/2012   AST 13 02/05/2012   NA 135 02/22/2012   K 4.5 02/22/2012   CL 101 02/22/2012   CREATININE 1.2 02/22/2012   BUN 35* 02/22/2012   CO2 27 02/22/2012   TSH 2.663 02/05/2012   INR 1.32 02/04/2012   HGBA1C 5.5 02/05/2012     Assessment / Plan:

## 2012-03-02 NOTE — Patient Instructions (Signed)
You are staying in rhythm  You may resume your activities as you feel  We are going to check some blood work today  I will have you see Dr. Elease Hashimoto in one month  Call the [facility] Care office at [phone] if you have any questions, problems or concerns.

## 2012-03-03 LAB — DIGOXIN LEVEL: Digoxin Level: 0.6 ng/mL — ABNORMAL LOW (ref 0.8–2.0)

## 2012-03-05 ENCOUNTER — Telehealth: Payer: Self-pay | Admitting: *Deleted

## 2012-03-05 ENCOUNTER — Other Ambulatory Visit: Payer: Self-pay

## 2012-03-05 ENCOUNTER — Other Ambulatory Visit: Payer: Self-pay | Admitting: *Deleted

## 2012-03-05 ENCOUNTER — Telehealth: Payer: Self-pay

## 2012-03-05 DIAGNOSIS — K745 Biliary cirrhosis, unspecified: Secondary | ICD-10-CM

## 2012-03-05 DIAGNOSIS — D649 Anemia, unspecified: Secondary | ICD-10-CM

## 2012-03-05 DIAGNOSIS — K219 Gastro-esophageal reflux disease without esophagitis: Secondary | ICD-10-CM

## 2012-03-05 DIAGNOSIS — R0602 Shortness of breath: Secondary | ICD-10-CM

## 2012-03-05 MED ORDER — PANTOPRAZOLE SODIUM 40 MG PO TBEC: 40.0000 mg | DELAYED_RELEASE_TABLET | Freq: Every day | ORAL | Status: DC

## 2012-03-05 MED ORDER — URSODIOL 250 MG PO TABS: 250.0000 mg | ORAL_TABLET | Freq: Two times a day (BID) | ORAL | Status: DC

## 2012-03-05 MED ORDER — SIMVASTATIN 40 MG PO TABS: 40.0000 mg | ORAL_TABLET | Freq: Every day | ORAL | Status: DC

## 2012-03-05 NOTE — Telephone Encounter (Signed)
Patient requesting a refill called in for Isosorbide for chest pain to [facility] [address]

## 2012-03-05 NOTE — Telephone Encounter (Signed)
Message copied by Antony Odea on Mon Mar 05, 2012 11:14 AM ------      Message from: Rosalio Macadamia      Created: Mon Mar 05, 2012 10:36 AM       Ok to report. Labs are satisfactory. Anemia is a little worse. Recheck CBC in one week. Please share with Dr. Elease Hashimoto. He is currently in sinus rhythm. Remains on Xarelto.

## 2012-03-05 NOTE — Telephone Encounter (Signed)
APP FOR LAB SET/ ADDED ON BNP FOR SOB AND POOR APPETITE, PT IS LOOSING WT, EDEMA ANKLES.

## 2012-03-06 NOTE — Telephone Encounter (Signed)
Left message on machine to make sure they get in touch with pt cardiologist

## 2012-03-06 NOTE — Telephone Encounter (Signed)
Spoke w/wife and she said she was told that pt's insurance would only pay for this med if pt's PCP Rxs it, which didn't make any sense to her. This was orig Rxd by hospitalist, and wife is not even sure that cardiologist wants pt to stay on this. Advised wife to call cardiologist and ask if he wants pt to remain on this med and if so, if he will Rx for pt. Ins should pay for it if ordered by the cardiologist. Wife agreed and will call us back if ins will not cover if PCP doesn't Rx.

## 2012-03-06 NOTE — Telephone Encounter (Signed)
Cory Lawrence sure he should get this from his cardiologist not [place].

## 2012-03-12 ENCOUNTER — Ambulatory Visit (INDEPENDENT_AMBULATORY_CARE_PROVIDER_SITE_OTHER): Payer: Medicare Other | Admitting: *Deleted

## 2012-03-12 DIAGNOSIS — I10 Essential (primary) hypertension: Secondary | ICD-10-CM

## 2012-03-12 DIAGNOSIS — K648 Other hemorrhoids: Secondary | ICD-10-CM

## 2012-03-12 DIAGNOSIS — R55 Syncope and collapse: Secondary | ICD-10-CM

## 2012-03-12 DIAGNOSIS — R0602 Shortness of breath: Secondary | ICD-10-CM

## 2012-03-12 DIAGNOSIS — E785 Hyperlipidemia, unspecified: Secondary | ICD-10-CM

## 2012-03-12 LAB — CBC WITH DIFFERENTIAL/PLATELET
Basophils Relative: 0.6 % (ref 0.0–3.0)
Eosinophils Absolute: 0.1 10*3/uL (ref 0.0–0.7)
Eosinophils Relative: 2.1 % (ref 0.0–5.0)
Hemoglobin: 9.9 g/dL — ABNORMAL LOW (ref 13.0–17.0)
Lymphocytes Relative: 25.3 % (ref 12.0–46.0)
MCHC: 33 g/dL (ref 30.0–36.0)
Monocytes Relative: 7.3 % (ref 3.0–12.0)
Neutro Abs: 3.4 10*3/uL (ref 1.4–7.7)
RBC: 3.16 Mil/uL — ABNORMAL LOW (ref 4.22–5.81)

## 2012-03-18 ENCOUNTER — Other Ambulatory Visit: Payer: Self-pay | Admitting: Emergency Medicine

## 2012-03-20 ENCOUNTER — Ambulatory Visit (INDEPENDENT_AMBULATORY_CARE_PROVIDER_SITE_OTHER): Payer: Medicare Other | Admitting: *Deleted

## 2012-03-20 DIAGNOSIS — K648 Other hemorrhoids: Secondary | ICD-10-CM

## 2012-03-20 LAB — CBC WITH DIFFERENTIAL/PLATELET
Basophils Absolute: 0 10*3/uL (ref 0.0–0.1)
Basophils Relative: 0.2 % (ref 0.0–3.0)
Eosinophils Absolute: 0.1 10*3/uL (ref 0.0–0.7)
Eosinophils Relative: 1.6 % (ref 0.0–5.0)
HCT: 31.2 % — ABNORMAL LOW (ref 39.0–52.0)
Hemoglobin: 10.3 g/dL — ABNORMAL LOW (ref 13.0–17.0)
Lymphocytes Relative: 20.9 % (ref 12.0–46.0)
Lymphs Abs: 1.4 10*3/uL (ref 0.7–4.0)
MCHC: 32.9 g/dL (ref 30.0–36.0)
MCV: 94.7 fl (ref 78.0–100.0)
Monocytes Absolute: 0.4 10*3/uL (ref 0.1–1.0)
Monocytes Relative: 6.6 % (ref 3.0–12.0)
Neutro Abs: 4.6 10*3/uL (ref 1.4–7.7)
Neutrophils Relative %: 70.7 % (ref 43.0–77.0)
Platelets: 154 10*3/uL (ref 150.0–400.0)
RBC: 3.3 Mil/uL — ABNORMAL LOW (ref 4.22–5.81)
RDW: 15.7 % — ABNORMAL HIGH (ref 11.5–14.6)
WBC: 6.5 10*3/uL (ref 4.5–10.5)

## 2012-03-27 ENCOUNTER — Other Ambulatory Visit: Payer: Self-pay | Admitting: Physician Assistant

## 2012-03-27 ENCOUNTER — Other Ambulatory Visit (HOSPITAL_COMMUNITY): Payer: Self-pay | Admitting: Family Medicine

## 2012-03-27 ENCOUNTER — Telehealth: Payer: Self-pay

## 2012-03-27 NOTE — Telephone Encounter (Signed)
MICHELLE [facility] [address] STATES THEY NEED A REFILL REQUEST ON COMBIVENT BECAUSE OF THE MEDICINE HE WAS ON THEY DON'T MAKE IT  ANYMORE. PLEASE CALL [phone]    [facility] [address]

## 2012-03-28 MED ORDER — IPRATROPIUM-ALBUTEROL 18-103 MCG/ACT IN AERO: 2.0000 | INHALATION_SPRAY | Freq: Four times a day (QID) | RESPIRATORY_TRACT | Status: DC | PRN

## 2012-03-28 NOTE — Telephone Encounter (Signed)
What was he on before?  I don't see another similar product.  I have authorized the Combivent.

## 2012-03-28 NOTE — Telephone Encounter (Signed)
Can we do this ?

## 2012-03-29 NOTE — Telephone Encounter (Signed)
Received a VM about this issue. The original Combivent "plain" is no longer manufactured. There is a new product called Combivent Respimat that we need to send a new Rx over for and message said to check directions for use bc it may need a different sig.

## 2012-03-29 NOTE — Telephone Encounter (Signed)
Please call patient let him know these her that prescription drugs prescribed by his cardiologist. He needs to contact his cardiologist or have his pharmacy contact the cardiologist so that he can get these 2 drugs refilled .

## 2012-03-30 ENCOUNTER — Other Ambulatory Visit: Payer: Self-pay | Admitting: *Deleted

## 2012-03-30 DIAGNOSIS — D649 Anemia, unspecified: Secondary | ICD-10-CM

## 2012-03-30 NOTE — Telephone Encounter (Signed)
LMOM advising patient info below. 

## 2012-03-30 NOTE — Progress Notes (Signed)
Pt already has lab date and had bnp, added cbc-d.

## 2012-03-30 NOTE — Telephone Encounter (Signed)
Please pull paper chart.  

## 2012-03-30 NOTE — Telephone Encounter (Signed)
Addended by: Jacqualyn Posey on: 03/30/2012 03:23 PM   Modules accepted: Orders

## 2012-03-31 ENCOUNTER — Other Ambulatory Visit: Payer: Self-pay | Admitting: *Deleted

## 2012-03-31 MED ORDER — ALPRAZOLAM 0.5 MG PO TABS: 0.5000 mg | ORAL_TABLET | Freq: Every evening | ORAL | Status: DC | PRN

## 2012-04-02 ENCOUNTER — Other Ambulatory Visit: Payer: Self-pay | Admitting: *Deleted

## 2012-04-02 MED ORDER — IPRATROPIUM-ALBUTEROL 20-100 MCG/ACT IN AERS: 1.0000 | INHALATION_SPRAY | Freq: Four times a day (QID) | RESPIRATORY_TRACT | Status: DC

## 2012-04-02 MED ORDER — ISOSORBIDE MONONITRATE ER 30 MG PO TB24: 30.0000 mg | ORAL_TABLET | Freq: Every day | ORAL | Status: DC

## 2012-04-02 MED ORDER — RIVAROXABAN 15 MG PO TABS: 15.0000 mg | ORAL_TABLET | Freq: Every day | ORAL | Status: DC

## 2012-04-02 NOTE — Telephone Encounter (Signed)
Pt's chart is at [facility] desk

## 2012-04-02 NOTE — Telephone Encounter (Signed)
Fax Received. Refill Completed. Cory Lawrence (R.M.A)   

## 2012-04-02 NOTE — Telephone Encounter (Signed)
This has been sent in  

## 2012-04-02 NOTE — Telephone Encounter (Signed)
Addended by: Pattricia Boss on: 04/02/2012 03:12 PM   Modules accepted: Orders

## 2012-04-03 ENCOUNTER — Ambulatory Visit (INDEPENDENT_AMBULATORY_CARE_PROVIDER_SITE_OTHER): Payer: Medicare Other | Admitting: *Deleted

## 2012-04-03 DIAGNOSIS — D649 Anemia, unspecified: Secondary | ICD-10-CM

## 2012-04-03 DIAGNOSIS — R0602 Shortness of breath: Secondary | ICD-10-CM

## 2012-04-03 LAB — CBC WITH DIFFERENTIAL/PLATELET
Basophils Relative: 0.6 % (ref 0.0–3.0)
Eosinophils Relative: 2.2 % (ref 0.0–5.0)
Monocytes Relative: 5.8 % (ref 3.0–12.0)
Neutrophils Relative %: 67.5 % (ref 43.0–77.0)
Platelets: 137 10*3/uL — ABNORMAL LOW (ref 150.0–400.0)
RBC: 3.45 Mil/uL — ABNORMAL LOW (ref 4.22–5.81)
WBC: 5.2 10*3/uL (ref 4.5–10.5)

## 2012-04-03 LAB — BRAIN NATRIURETIC PEPTIDE: Pro B Natriuretic peptide (BNP): 158 pg/mL — ABNORMAL HIGH (ref 0.0–100.0)

## 2012-04-10 ENCOUNTER — Ambulatory Visit (INDEPENDENT_AMBULATORY_CARE_PROVIDER_SITE_OTHER): Payer: Medicare Other | Admitting: Emergency Medicine

## 2012-04-10 VITALS — BP 142/56 | HR 63 | Temp 97.8°F | Resp 18 | Ht 63.0 in | Wt 150.2 lb

## 2012-04-10 DIAGNOSIS — IMO0002 Reserved for concepts with insufficient information to code with codable children: Secondary | ICD-10-CM

## 2012-04-10 DIAGNOSIS — I4891 Unspecified atrial fibrillation: Secondary | ICD-10-CM

## 2012-04-10 DIAGNOSIS — R634 Abnormal weight loss: Secondary | ICD-10-CM

## 2012-04-10 DIAGNOSIS — K922 Gastrointestinal hemorrhage, unspecified: Secondary | ICD-10-CM

## 2012-04-10 DIAGNOSIS — J449 Chronic obstructive pulmonary disease, unspecified: Secondary | ICD-10-CM

## 2012-04-10 LAB — CBC WITH DIFFERENTIAL/PLATELET
Basophils Absolute: 0 10*3/uL (ref 0.0–0.1)
Eosinophils Relative: 2 % (ref 0–5)
HCT: 31.9 % — ABNORMAL LOW (ref 39.0–52.0)
Hemoglobin: 10.9 g/dL — ABNORMAL LOW (ref 13.0–17.0)
Lymphocytes Relative: 19 % (ref 12–46)
Lymphs Abs: 1.1 10*3/uL (ref 0.7–4.0)
MCV: 91.9 fL (ref 78.0–100.0)
Monocytes Absolute: 0.4 10*3/uL (ref 0.1–1.0)
Monocytes Relative: 7 % (ref 3–12)
Neutro Abs: 4.2 10*3/uL (ref 1.7–7.7)
RBC: 3.47 MIL/uL — ABNORMAL LOW (ref 4.22–5.81)
WBC: 5.9 10*3/uL (ref 4.0–10.5)

## 2012-04-10 LAB — IRON AND TIBC
%SAT: 37 % (ref 20–55)
TIBC: 262 ug/dL (ref 215–435)
UIBC: 164 ug/dL (ref 125–400)

## 2012-04-10 LAB — RETICULOCYTES
ABS Retic: 55.5 10*3/uL (ref 19.0–186.0)
RBC.: 3.47 MIL/uL — ABNORMAL LOW (ref 4.22–5.81)

## 2012-04-10 NOTE — Progress Notes (Signed)
  Subjective:    Cory Lawrence, male    DOB: 19-Nov-1924, 78 y.o.   MRN: [id number]  HPI patient in for followup he has been under a great deal of stress recently with his son being in prison. Hospitalization and cardiac catheterization with findings of disease but not in need of stenting or bypass. He had a GI bleed while in the hospital. He did not require blood but dropped his hemoglobin down to 9. He currently is on B12 injections and iron pills.    Review of Systems     Objective:   Physical Exam HEENT exam is unremarkable neck supple chest decreased breath sounds in the bases consistent with COPD. Cardiac exam reveals a slow regular rate.        Assessment & Plan:  Patient stable at present. We'll make no changes in his treatment. He is scheduled to see Dr. Jarold Motto for evaluation. We'll go ahead and check iron and retic studies.

## 2012-04-24 ENCOUNTER — Ambulatory Visit (INDEPENDENT_AMBULATORY_CARE_PROVIDER_SITE_OTHER): Payer: Medicare Other | Admitting: Cardiovascular Disease

## 2012-04-24 ENCOUNTER — Encounter: Payer: Self-pay | Admitting: Cardiovascular Disease

## 2012-04-24 VITALS — BP 116/61 | HR 78 | Ht 63.0 in | Wt 150.4 lb

## 2012-04-24 DIAGNOSIS — I519 Heart disease, unspecified: Secondary | ICD-10-CM

## 2012-04-24 DIAGNOSIS — I4891 Unspecified atrial fibrillation: Secondary | ICD-10-CM

## 2012-04-24 NOTE — Assessment & Plan Note (Signed)
Cory Lawrence is doing well. His rhythm is fairly regular today.  He's currently on Xarelto.  He apparently needs to have an endoscopy. He'll need to hold his Xarelto for 24 hours prior to procedure.

## 2012-04-24 NOTE — Assessment & Plan Note (Addendum)
Left ventricular systolic motion is mildly depressed.  He generally seems to be getting a little stronger and is doing very well. I'll see him again in 3 months for followup visit.

## 2012-04-24 NOTE — Patient Instructions (Addendum)
Your physician recommends that you schedule a follow-up appointment in: 3 months. Your physician recommends that you continue on your current medications as directed. Please refer to the Current Medication list given to you today. 

## 2012-04-24 NOTE — Progress Notes (Signed)
[name] Date of Birth  05-Feb-1925       [facility]    [place] [address], [address]  [address] [place], [place]  [address]   [place], [place]  [address] [phone]     [phone]   Fax  [phone]    Fax [phone]  Problem List: 1. Coronary artery disease-medical therapy 2. Paroxysmal atrial fibrillation 3. Diabetes mellitus 4. Hypertension  History of Present Illness:  Sloan is an 78 year old gentleman with a history of moderate coronary artery disease. We've been treating him medically. He also has had some paroxysmal atrial fibrillation. There'll seems to be getting along a little bit better.    Current Outpatient Prescriptions on File Prior to Visit  Medication Sig Dispense Refill  . albuterol-ipratropium (COMBIVENT) 18-103 MCG/ACT inhaler Inhale 2 puffs into the lungs every 6 (six) hours as needed for wheezing.  1 Inhaler  2  . ALPRAZolam (XANAX) 0.5 MG tablet Take 1 tablet (0.5 mg total) by mouth at bedtime as needed.  30 tablet  0  . cyanocobalamin (,VITAMIN B-12,) 1000 MCG/ML injection Inject 1 mL (1,000 mcg total) into the muscle every 30 (thirty) days.  10 mL  1  . digoxin (LANOXIN) 0.125 MG tablet Take 1 tablet (125 mcg total) by mouth daily.  30 tablet  11  . doxazosin (CARDURA) 4 MG tablet TAKE 1 TABLET EVERY DAY AT BEDTIME  30 tablet  0  . Fe-Succ Ac-C-Thre Ac-B12-FA (FERREX 150 FORTE PLUS) 50-100 MG CAPS Take one tablet by mouth once a day  30 each  3  . Fluticasone-Salmeterol (ADVAIR DISKUS) 250-50 MCG/DOSE AEPB Inhale 1 puff into the lungs as needed. Shortness of breath.      . Ipratropium-Albuterol (COMBIVENT IN) Inhale 1 puff into the lungs as needed. Shortness of breath.      . Ipratropium-Albuterol (COMBIVENT) 20-100 MCG/ACT AERS respimat Inhale 1 puff into the lungs every 6 (six) hours.  1 Inhaler  2  . isosorbide mononitrate (IMDUR) 30 MG 24 hr tablet Take 1 tablet (30 mg total) by mouth daily.   30 tablet  5  . Melatonin 3 MG CAPS Take 3 mg by mouth at bedtime as needed. Sleep       . Multiple Vitamin (MULTIVITAMIN) tablet Take 1 tablet by mouth daily.        . Omega-3 Fatty Acids (FISH OIL) 1000 MG CAPS Take 1,000 mg by mouth daily.       . pantoprazole (PROTONIX) 40 MG tablet Take 1 tablet (40 mg total) by mouth daily with breakfast.  90 tablet  1  . Rivaroxaban (XARELTO) 15 MG TABS tablet Take 1 tablet (15 mg total) by mouth daily with supper.  30 tablet  5  . simvastatin (ZOCOR) 40 MG tablet Take 1 tablet (40 mg total) by mouth at bedtime.  90 tablet  1  . triamcinolone (KENALOG) 0.1 % cream Apply 1 application topically daily. Apply to ankles.      Marland Kitchen triamcinolone ointment (KENALOG) 0.1 % APPLY TO AFFECTED AREA TWICE A DAY  30 g  1  . ursodiol (ACTIGALL) 250 MG tablet Take 1 tablet (250 mg total) by mouth 2 (two) times daily.  180 tablet  1  . vitamin E 400 UNIT capsule Take 400 Units by mouth daily.          Allergies  Allergen Reactions  . Penicillins Hives    Past Medical History  Diagnosis Date  .  Dizziness   . Diaphoresis   . Palpitations   . COPD (chronic obstructive pulmonary disease)   . Hypertension   . Hyperlipidemia   . GERD (gastroesophageal reflux disease)   . BPH (benign prostatic hypertrophy)   . Personal history of colonic polyps 07/08/2002,06/2011    tubular adenoma, hyperplastic  . Internal hemorrhoids without mention of complication   . Diverticulosis of colon (without mention of hemorrhage)   . Biliary cirrhosis   . Unspecified gastritis and gastroduodenitis without mention of hemorrhage   . NASH (nonalcoholic steatohepatitis)   . Anxiety   . Arthritis   . Ulcer     Gastric/bleeding  . Diabetes mellitus type 2, diet-controlled     managed with diet  . Vitamin B12 deficiency   . PAF (paroxysmal atrial fibrillation)     on Xarelto  . CAD (coronary artery disease)     prior abnormal Myoview; subsequent cath; managed medically    Past  Surgical History  Procedure Date  . Cataract extraction     bilateral  . Eye surgery     left eye x multiple  . Cardiac catheterization March 2013    Managed medically    History  Smoking status  . Former Smoker  Smokeless tobacco  . Former Neurosurgeon  . Quit date: 05/31/1996    History  Alcohol Use No    Family History  Problem Relation Age of Onset  . Heart failure Mother   . Diabetes Mother   . Diabetes Sister   . Heart failure Sister   . Colon cancer Neg Hx   . Cancer Mother     unknown type    Reviw of Systems:  Reviewed in the HPI.  All other systems are negative.  Physical Exam: Blood pressure 116/61, pulse 78, height 5\' 3"  (1.6 m), weight 150 lb 6.4 oz (68.221 kg). General: Well developed, well nourished, in no acute distress.  Head: Normocephalic, atraumatic, sclera non-icteric, mucus membranes are moist,   Neck: Supple. Carotids are 2 + without bruits. No JVD  Lungs: Clear bilaterally to auscultation.  Heart: regular rate.  normal  S1 S2. No murmurs, gallops or rubs.  Abdomen: Soft, non-tender, non-distended with normal bowel sounds. No hepatomegaly. No rebound/guarding. No masses.  Msk:  Strength and tone are normal  Extremities: No clubbing or cyanosis. No edema.  Distal pedal pulses are 2+ and equal bilaterally.  Neuro: Alert and oriented X 3. Moves all extremities spontaneously.  Psych:  Responds to questions appropriately with a normal affect.  ECG:  Assessment / Plan:

## 2012-04-30 ENCOUNTER — Other Ambulatory Visit: Payer: Self-pay | Admitting: Physician Assistant

## 2012-05-01 ENCOUNTER — Other Ambulatory Visit: Payer: Self-pay | Admitting: Family Medicine

## 2012-05-01 MED ORDER — ALPRAZOLAM 0.5 MG PO TABS: 0.5000 mg | ORAL_TABLET | Freq: Every evening | ORAL | Status: DC | PRN

## 2012-06-09 ENCOUNTER — Other Ambulatory Visit (HOSPITAL_COMMUNITY): Payer: Self-pay | Admitting: Family Medicine

## 2012-06-10 NOTE — Telephone Encounter (Signed)
Okay to refill both medications 

## 2012-06-13 ENCOUNTER — Other Ambulatory Visit: Payer: Self-pay

## 2012-06-13 MED ORDER — ALPRAZOLAM 0.5 MG PO TABS: 0.5000 mg | ORAL_TABLET | Freq: Every evening | ORAL | Status: DC | PRN

## 2012-06-14 ENCOUNTER — Other Ambulatory Visit: Payer: Self-pay | Admitting: *Deleted

## 2012-06-26 ENCOUNTER — Other Ambulatory Visit: Payer: Self-pay | Admitting: Gastroenterology

## 2012-07-06 ENCOUNTER — Encounter: Payer: Self-pay | Admitting: Emergency Medicine

## 2012-07-10 ENCOUNTER — Ambulatory Visit (INDEPENDENT_AMBULATORY_CARE_PROVIDER_SITE_OTHER): Payer: Medicare Other | Admitting: Emergency Medicine

## 2012-07-10 ENCOUNTER — Encounter: Payer: Self-pay | Admitting: Emergency Medicine

## 2012-07-10 VITALS — BP 140/58 | HR 56 | Temp 98.5°F | Resp 16 | Ht 63.5 in | Wt 145.8 lb

## 2012-07-10 DIAGNOSIS — F339 Major depressive disorder, recurrent, unspecified: Secondary | ICD-10-CM

## 2012-07-10 DIAGNOSIS — F419 Anxiety disorder, unspecified: Secondary | ICD-10-CM

## 2012-07-10 DIAGNOSIS — Z23 Encounter for immunization: Secondary | ICD-10-CM

## 2012-07-10 DIAGNOSIS — I4891 Unspecified atrial fibrillation: Secondary | ICD-10-CM

## 2012-07-10 DIAGNOSIS — J449 Chronic obstructive pulmonary disease, unspecified: Secondary | ICD-10-CM

## 2012-07-10 MED ORDER — ALPRAZOLAM 0.5 MG PO TABS: 0.5000 mg | ORAL_TABLET | Freq: Every evening | ORAL | Status: DC | PRN

## 2012-07-10 NOTE — Progress Notes (Signed)
  Subjective:    Patient ID: Cory Lawrence, male    DOB: 08/27/1925, 78 y.o.   MRN: [id number]  HPI patient here to followup on his COPD. He also sees his GI doctor for biliary cirrhosis. He is doing better with his depression over his son's incarceration for murder. [place] he is not currently able to visit him. His breathing has been stable he has had no recent sweats. He did develop atrial fibrillation I suspect stress triggered but is now on Xareto and rate suppressants.    Review of Systems     Objective:   Physical Exam check looks good today except for his weight loss. His neck is supple chest exam reveals diminished breath sounds in the bases with increased AP diameter consistent with his COPD. Cardiac exam revealed an irregular rhythm I suspect secondary to his atrial fib with a rate controlled       Assessment & Plan:  I did not do an EKG today because patient is on blood thinners already and on medications for rate control of atrial fib. He feels well his COPD is stable. He was given a flu vaccine recheck in 3-4 months. I did refill her Xanax he takes at night to help him sleep

## 2012-07-18 ENCOUNTER — Ambulatory Visit (INDEPENDENT_AMBULATORY_CARE_PROVIDER_SITE_OTHER): Payer: Medicare Other | Admitting: Cardiovascular Disease

## 2012-07-18 ENCOUNTER — Encounter: Payer: Self-pay | Admitting: Cardiovascular Disease

## 2012-07-18 VITALS — BP 136/54 | HR 56 | Ht 63.5 in | Wt 145.1 lb

## 2012-07-18 DIAGNOSIS — I4891 Unspecified atrial fibrillation: Secondary | ICD-10-CM

## 2012-07-18 DIAGNOSIS — I2510 Atherosclerotic heart disease of native coronary artery without angina pectoris: Secondary | ICD-10-CM

## 2012-07-18 NOTE — Patient Instructions (Addendum)
Your physician wants you to follow-up in: 6 months  You will receive a reminder letter in the mail two months in advance. If you don't receive a letter, please call our office to schedule the follow-up appointment.   Your physician recommends that you continue on your current medications as directed. Please refer to the Current Medication list given to you today.   Your physician recommends that you return for a FASTING lipid profile: 6 months   

## 2012-07-18 NOTE — Progress Notes (Signed)
Cory Lawrence Date of Birth  06/18/1925       [facility]    [place] [address]  [address] [place], [place]  [address]   [place], [place]  [address] [phone]     [phone]   Fax  [phone]    Fax [phone]  Problem List: 1. Coronary artery disease-medical therapy 2. Paroxysmal atrial fibrillation 3. Diabetes mellitus 4. Hypertension  History of Present Illness:  Cory Lawrence is an 78 year old gentleman with a history of moderate coronary artery disease. We've been treating him medically. He also has had some paroxysmal atrial fibrillation. There'll seems to be getting along a little bit better.  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    Current Outpatient Prescriptions on File Prior to Visit  Medication Sig Dispense Refill  . albuterol-ipratropium (COMBIVENT) 18-103 MCG/ACT inhaler Inhale 2 puffs into the lungs every 6 (six) hours as needed for wheezing.  1 Inhaler  2  . ALPRAZolam (XANAX) 0.5 MG tablet Take 1 tablet (0.5 mg total) by mouth at bedtime as needed.  30 tablet  5  . cyanocobalamin (,VITAMIN B-12,) 1000 MCG/ML injection Inject 1 mL (1,000 mcg total) into the muscle every 30 (thirty) days.  10 mL  1  . digoxin (LANOXIN) 0.125 MG tablet Take 1 tablet (125 mcg total) by mouth daily.  30 tablet  11  . doxazosin (CARDURA) 4 MG tablet TAKE 1 TABLET EVERY DAY AT BEDTIME  30 tablet  2  . Fe-Succ Ac-C-Thre Ac-B12-FA (FERREX 150 FORTE PLUS) 50-100 MG CAPS TAKE 1 CAPSULE DAILY  30 each  3  . Fluticasone-Salmeterol (ADVAIR DISKUS) 250-50 MCG/DOSE AEPB Inhale 1 puff into the lungs as needed. Shortness of breath.      . Ipratropium-Albuterol (COMBIVENT IN) Inhale 1 puff into the lungs as needed. Shortness of breath.      . Ipratropium-Albuterol (COMBIVENT) 20-100 MCG/ACT AERS respimat Inhale 1 puff into the lungs every 6 (six) hours.  1 Inhaler  2  . isosorbide mononitrate (IMDUR) 30 MG 24 hr  tablet Take 1 tablet (30 mg total) by mouth daily.  30 tablet  5  . isosorbide mononitrate (IMDUR) 30 MG 24 hr tablet TAKE 1 TABLET (30 MG TOTAL) BY MOUTH DAILY.  30 tablet  0  . Melatonin 3 MG CAPS Take 3 mg by mouth at bedtime as needed. Sleep       . Multiple Vitamin (MULTIVITAMIN) tablet Take 1 tablet by mouth daily.        . Omega-3 Fatty Acids (FISH OIL) 1000 MG CAPS Take 1,000 mg by mouth daily.       . pantoprazole (PROTONIX) 40 MG tablet Take 1 tablet (40 mg total) by mouth daily with breakfast.  90 tablet  1  . Rivaroxaban (XARELTO) 15 MG TABS tablet Take 1 tablet (15 mg total) by mouth daily with supper.  30 tablet  5  . simvastatin (ZOCOR) 40 MG tablet Take 1 tablet (40 mg total) by mouth at bedtime.  90 tablet  1  . triamcinolone (KENALOG) 0.1 % cream Apply 1 application topically daily. Apply to ankles.      Marland Kitchen triamcinolone ointment (KENALOG) 0.1 % APPLY TO AFFECTED AREA TWICE A DAY  30 g  1  . ursodiol (ACTIGALL) 250 MG tablet Take 1 tablet (250 mg total) by mouth 2 (two) times daily.  180 tablet  1  . vitamin E 400 UNIT capsule  Take 400 Units by mouth daily.        Carlena Hurl 15 MG TABS tablet TAKE 1 TABLET (15 MG TOTAL) BY MOUTH DAILY WITH SUPPER.  30 tablet  0    Allergies  Allergen Reactions  . Penicillins Hives    Past Medical History  Diagnosis Date  . Dizziness   . Diaphoresis   . Palpitations   . COPD (chronic obstructive pulmonary disease)   . Hypertension   . Hyperlipidemia   . GERD (gastroesophageal reflux disease)   . BPH (benign prostatic hypertrophy)   . Personal history of colonic polyps 07/08/2002,06/2011    tubular adenoma, hyperplastic  . Internal hemorrhoids without mention of complication   . Diverticulosis of colon (without mention of hemorrhage)   . Biliary cirrhosis   . Unspecified gastritis and gastroduodenitis without mention of hemorrhage   . NASH (nonalcoholic steatohepatitis)   . Anxiety   . Arthritis   . Ulcer     Gastric/bleeding  .  Diabetes mellitus type 2, diet-controlled     managed with diet  . Vitamin B12 deficiency   . PAF (paroxysmal atrial fibrillation)     on Xarelto  . CAD (coronary artery disease)     prior abnormal Myoview; subsequent cath; managed medically    Past Surgical History  Procedure Date  . Cataract extraction     bilateral  . Eye surgery     left eye x multiple  . Cardiac catheterization March 2013    Managed medically    History  Smoking status  . Former Smoker  Smokeless tobacco  . Former Neurosurgeon  . Quit date: 05/31/1996    History  Alcohol Use No    Family History  Problem Relation Age of Onset  . Heart failure Mother   . Diabetes Mother   . Diabetes Sister   . Heart failure Sister   . Colon cancer Neg Hx   . Cancer Mother     unknown type    Reviw of Systems:  Reviewed in the HPI.  All other systems are negative.  Physical Exam: Blood pressure 136/54, pulse 56, height 5' 3.5" (1.613 m), weight 145 lb 1.9 oz (65.826 kg), SpO2 98.00%. General: Well developed, well nourished, in no acute distress.  Head: Normocephalic, atraumatic, sclera non-icteric, mucus membranes are moist,   Neck: Supple. Carotids are 2 + without bruits. No JVD  Lungs: Clear bilaterally to auscultation.  Heart: regular rate.  normal  S1 S2. No murmurs, gallops or rubs.  Abdomen: Soft, non-tender, non-distended with normal bowel sounds. No hepatomegaly. No rebound/guarding. No masses.  Msk:  Strength and tone are normal  Extremities: No clubbing or cyanosis. No edema.  Distal pedal pulses are 2+ and equal bilaterally.  Neuro: Alert and oriented X 3. Moves all extremities spontaneously.  Psych:  Responds to questions appropriately with a normal affect.  ECG:  Assessment / Plan:

## 2012-07-18 NOTE — Assessment & Plan Note (Signed)
Mr. Davee is doing well.  He is not having any angina.  He will continue with his current medications.

## 2012-07-18 NOTE — Assessment & Plan Note (Signed)
He remains in sinus rhythm 

## 2012-08-05 ENCOUNTER — Other Ambulatory Visit (HOSPITAL_COMMUNITY): Payer: Self-pay | Admitting: Emergency Medicine

## 2012-08-05 ENCOUNTER — Other Ambulatory Visit: Payer: Self-pay | Admitting: Physician Assistant

## 2012-08-07 ENCOUNTER — Other Ambulatory Visit (HOSPITAL_COMMUNITY): Payer: Self-pay | Admitting: Cardiovascular Disease

## 2012-08-07 NOTE — Telephone Encounter (Signed)
Fax Received. Refill Completed. Jesusa Stenerson Chowoe (R.M.A)   

## 2012-08-14 ENCOUNTER — Other Ambulatory Visit: Payer: Self-pay | Admitting: Emergency Medicine

## 2012-08-14 ENCOUNTER — Telehealth: Payer: Self-pay

## 2012-08-14 NOTE — Telephone Encounter (Signed)
Pt is needing to talk with someone about the refill request for his blood pressure medication   Believes it has been either denied or the pharmacy has told him that he has no more refills because he states he has been trying to get the pharmacy to fill it twice now   Best number [phone]

## 2012-08-14 NOTE — Telephone Encounter (Signed)
Patient has gotten it now.

## 2012-09-03 ENCOUNTER — Telehealth: Payer: Self-pay | Admitting: Gastroenterology

## 2012-09-04 NOTE — Telephone Encounter (Signed)
lmom for wife to call back. I cannot find an ER note in the system in the spring. Last COLON 06/01/11. Last ER visit 11/18/11 for near syncope event.

## 2012-09-06 ENCOUNTER — Other Ambulatory Visit: Payer: Self-pay | Admitting: Physician Assistant

## 2012-09-07 NOTE — Telephone Encounter (Signed)
Left message for patient to call back  

## 2012-09-17 NOTE — Telephone Encounter (Signed)
Pt nor wife ever called back.

## 2012-10-09 ENCOUNTER — Other Ambulatory Visit: Payer: Self-pay | Admitting: *Deleted

## 2012-10-09 MED ORDER — ISOSORBIDE MONONITRATE ER 30 MG PO TB24: 30.0000 mg | ORAL_TABLET | Freq: Every day | ORAL | Status: DC

## 2012-10-16 ENCOUNTER — Ambulatory Visit (INDEPENDENT_AMBULATORY_CARE_PROVIDER_SITE_OTHER): Payer: Medicare Other | Admitting: Emergency Medicine

## 2012-10-16 ENCOUNTER — Encounter: Payer: Self-pay | Admitting: Emergency Medicine

## 2012-10-16 VITALS — BP 127/66 | HR 77 | Temp 97.1°F | Resp 16 | Ht 63.0 in | Wt 143.0 lb

## 2012-10-16 DIAGNOSIS — D649 Anemia, unspecified: Secondary | ICD-10-CM

## 2012-10-16 DIAGNOSIS — E538 Deficiency of other specified B group vitamins: Secondary | ICD-10-CM

## 2012-10-16 DIAGNOSIS — F419 Anxiety disorder, unspecified: Secondary | ICD-10-CM

## 2012-10-16 DIAGNOSIS — J449 Chronic obstructive pulmonary disease, unspecified: Secondary | ICD-10-CM

## 2012-10-16 DIAGNOSIS — K7460 Unspecified cirrhosis of liver: Secondary | ICD-10-CM

## 2012-10-16 DIAGNOSIS — I10 Essential (primary) hypertension: Secondary | ICD-10-CM

## 2012-10-16 LAB — CBC WITH DIFFERENTIAL/PLATELET
Basophils Absolute: 0 10*3/uL (ref 0.0–0.1)
Basophils Relative: 0 % (ref 0–1)
Lymphocytes Relative: 20 % (ref 12–46)
MCHC: 34.5 g/dL (ref 30.0–36.0)
Neutro Abs: 5.2 10*3/uL (ref 1.7–7.7)
Neutrophils Relative %: 71 % (ref 43–77)
RDW: 14.7 % (ref 11.5–15.5)
WBC: 7.4 10*3/uL (ref 4.0–10.5)

## 2012-10-16 LAB — IRON AND TIBC
%SAT: 42 % (ref 20–55)
Iron: 107 ug/dL (ref 42–165)
TIBC: 252 ug/dL (ref 215–435)
UIBC: 145 ug/dL (ref 125–400)

## 2012-10-16 LAB — FERRITIN: Ferritin: 79 ng/mL (ref 22–322)

## 2012-10-16 LAB — COMPREHENSIVE METABOLIC PANEL
ALT: 17 U/L (ref 0–53)
AST: 17 U/L (ref 0–37)
Albumin: 3.4 g/dL — ABNORMAL LOW (ref 3.5–5.2)
Alkaline Phosphatase: 68 U/L (ref 39–117)
Chloride: 105 mEq/L (ref 96–112)
Potassium: 3.9 mEq/L (ref 3.5–5.3)
Sodium: 139 mEq/L (ref 135–145)
Total Protein: 6.4 g/dL (ref 6.0–8.3)

## 2012-10-16 LAB — LIPID PANEL
HDL: 37 mg/dL — ABNORMAL LOW (ref >39)
LDL Cholesterol: 34 mg/dL (ref 0–99)

## 2012-10-16 LAB — VITAMIN B12: Vitamin B-12: 770 pg/mL (ref 211–911)

## 2012-10-16 MED ORDER — ALPRAZOLAM 0.5 MG PO TABS: 0.5000 mg | ORAL_TABLET | Freq: Every evening | ORAL | Status: DC | PRN

## 2012-10-16 NOTE — Progress Notes (Signed)
  Subjective:    Patient ID: Cory Lawrence, male    DOB: December 26, 1924, 78 y.o.   MRN: [id number]  HPI problem #1 anemia. He has a history of cirrhosis and a history of peptic ulcer disease with bleeding. He has not seen Dr. Jarold Motto recently. Problem #2 COPD. His breathing has been stable he has had no worsening shortness of breath or chest tightness. Problem #3 is atrial fibrillation. He's currently on anticoagulation with a xarelto. and under the care of a cardiologist for this. He's had no recent episodes of paroxysmal atrial fib. Problem #4 depression. His son who is in prison is currently teaching and this has helped him. His son is currently in prison and he is unable to visit him.    Review of Systems     Objective:   Physical Exam HEENT exam is unremarkable. His neck is supple. His chest reveals increased AP diameter with decreased breath sounds in the bases but no rales are heard. There are no wheezes heard. Extremities are without edema the        Assessment & Plan:  Patient doing well at present. The head and have him make his own appointment to see Dr. Jarold Motto for recheck. We'll go ahead and do his baseline blood work and check on status of his anemia. His depression is stable at present no changes need to be made with this

## 2012-10-17 ENCOUNTER — Other Ambulatory Visit: Payer: Self-pay

## 2012-10-17 ENCOUNTER — Other Ambulatory Visit: Payer: Self-pay | Admitting: Gastroenterology

## 2012-10-17 NOTE — Telephone Encounter (Signed)
Looks like Dr Jarold Motto was giving him this. Are we going to take this over? Dr Deforest Hoyles note indicates he was to have further work up with Dr Jarold Motto.

## 2012-10-17 NOTE — Telephone Encounter (Signed)
Thank you I have called patient to advise. Left message  

## 2012-10-17 NOTE — Telephone Encounter (Signed)
He is to follow up with Dr. Jarold Motto. We ordered labs but it does clearly state he is to follow up with GI.

## 2012-10-17 NOTE — Telephone Encounter (Signed)
Patient states he needs a refill of his B-12.

## 2012-10-18 LAB — SPEP & IFE WITH QIG
Beta 2: 7.2 % — ABNORMAL HIGH (ref 3.2–6.5)
Beta Globulin: 6.4 % (ref 4.7–7.2)
Gamma Globulin: 16 % (ref 11.1–18.8)
IgA: 427 mg/dL — ABNORMAL HIGH (ref 68–379)
IgG (Immunoglobin G), Serum: 1080 mg/dL (ref 650–1600)
Total Protein, Serum Electrophoresis: 6.4 g/dL (ref 6.0–8.3)

## 2012-10-19 ENCOUNTER — Other Ambulatory Visit: Payer: Self-pay | Admitting: Gastroenterology

## 2012-10-30 ENCOUNTER — Ambulatory Visit: Payer: Medicare Other | Admitting: Gastroenterology

## 2012-11-01 ENCOUNTER — Encounter: Payer: Self-pay | Admitting: Gastroenterology

## 2012-11-01 ENCOUNTER — Ambulatory Visit (INDEPENDENT_AMBULATORY_CARE_PROVIDER_SITE_OTHER): Payer: Medicare Other | Admitting: Gastroenterology

## 2012-11-01 ENCOUNTER — Other Ambulatory Visit: Payer: Self-pay | Admitting: Gastroenterology

## 2012-11-01 ENCOUNTER — Other Ambulatory Visit (INDEPENDENT_AMBULATORY_CARE_PROVIDER_SITE_OTHER): Payer: Medicare Other

## 2012-11-01 VITALS — BP 130/60 | HR 90 | Ht 63.0 in | Wt 144.1 lb

## 2012-11-01 DIAGNOSIS — K745 Biliary cirrhosis, unspecified: Secondary | ICD-10-CM

## 2012-11-01 DIAGNOSIS — R5383 Other fatigue: Secondary | ICD-10-CM

## 2012-11-01 DIAGNOSIS — D649 Anemia, unspecified: Secondary | ICD-10-CM

## 2012-11-01 DIAGNOSIS — K7460 Unspecified cirrhosis of liver: Secondary | ICD-10-CM

## 2012-11-01 DIAGNOSIS — R7989 Other specified abnormal findings of blood chemistry: Secondary | ICD-10-CM

## 2012-11-01 DIAGNOSIS — K3189 Other diseases of stomach and duodenum: Secondary | ICD-10-CM

## 2012-11-01 DIAGNOSIS — R799 Abnormal finding of blood chemistry, unspecified: Secondary | ICD-10-CM

## 2012-11-01 DIAGNOSIS — R6889 Other general symptoms and signs: Secondary | ICD-10-CM

## 2012-11-01 DIAGNOSIS — R5381 Other malaise: Secondary | ICD-10-CM

## 2012-11-01 DIAGNOSIS — K319 Disease of stomach and duodenum, unspecified: Secondary | ICD-10-CM

## 2012-11-01 LAB — PROTIME-INR
INR: 1.4 ratio — ABNORMAL HIGH (ref 0.8–1.0)
Prothrombin Time: 14.5 s — ABNORMAL HIGH (ref 10.2–12.4)

## 2012-11-01 LAB — AMMONIA: Ammonia: 4 umol/L — ABNORMAL LOW (ref 11–35)

## 2012-11-01 NOTE — Patient Instructions (Signed)
Your physician has requested that you go to the basement for  lab work before leaving today.   

## 2012-11-01 NOTE — Progress Notes (Signed)
This is a very complicated 79 year old patient with multiple cardiovascular issues coronary artery disease and chronic congestive heart failure.  I have seen him for the last 10 years because of primary biliary cirrhosis and steatohepatitis with liver biopsy 10 years ago showed evidence of cirrhosis.  He is been on Actigall and 50 mg twice a day for years.  Review of recent labs shows normal liver function tests, and ultrasound exam one year ago was unremarkable without evidence of ascites or portal hypertension.  In the past, endoscopy showed evidence of mild portal gastropathy, and he is on chronic iron therapy, and recent labs have shown a stable hemoglobin of 11.1 with normal iron saturations and normal ferritin level.  He denies any GI complaints at this time and is on Protonix 40 mg a day for GERD.  There is no history of abdominal pain, nausea vomiting, memory disorder, fluid retention, hematemesis, melena, he is up-to-date on his endoscopy and colonoscopy exams.  He and his wife deny alcohol use.  He is followed closely by cardiology by Dr.Nasher and is on digoxin, Cardura, and Imdur.  His wife relates that he has lost his appetite and lost 40 pounds in weight over the last one to 2 years.  Consent exam has been completed within the last year.  The family denies a history of jaundice, fever, chills, severe mental status changes.  Current Medications, Allergies, Past Medical History, Past Surgical History, Family History and Social History were reviewed in [employer] record.  Pertinent Review of Systems Negative... pain and tenderness in his legs bilaterally at night probably from a peripheral neuropathy.   Physical Exam: Chronically ill-appearing patient in no acute distress.  Blood pressure 130/60, pulse 90 and regular, weight 144 with a BMI of 25.53.  97% oxygen saturation on room air.  I cannot appreciate jaundice or stigmata of chronic liver disease.  He does have some  wheezes bilaterally his chest, and he appears to be in irregular rhythm.  He has a history of atrial fibrillation.  Cannot appreciate hepatomegaly, nominal masses, tenderness, or ascites.  There is no peripheral edema, phlebitis, or swollen joints.  Mental status is normal and there is no asterixis noted.    Assessment and Plan: Chronic stable cirrhosis with associated portal gastropathy and chronic low-grade iron loss.  The patient seems to be maintaining a normal hemoglobin and iron saturation with exogenous by mouth iron.  I've asked continue his Protonix, and we will check labs to assess liver function including PT, ammonia level, and alpha-fetoprotein.  Also repeat AMA in the past has been present.  He is to continue his iron with periodic CBCs as per primary care.  The patient is on Xarelto for his atrial fibrillation.  Review of his labs shows no evidence of thrombocytopenia. Encounter Diagnoses  Name Primary?  . Biliary cirrhosis   . Anemia   . Other general symptoms   . Fatigue Yes  . Abnormal blood chemistry   . Cirrhosis

## 2012-11-02 LAB — AFP TUMOR MARKER: AFP-Tumor Marker: 1.4 ng/mL (ref 0.0–8.0)

## 2012-11-02 LAB — MITOCHONDRIAL ANTIBODIES: Mitochondrial M2 Ab, IgG: 8.65 — ABNORMAL HIGH (ref <0.91)

## 2012-11-06 ENCOUNTER — Other Ambulatory Visit: Payer: Self-pay | Admitting: Physician Assistant

## 2012-11-08 ENCOUNTER — Other Ambulatory Visit: Payer: Self-pay | Admitting: Physician Assistant

## 2012-11-19 ENCOUNTER — Telehealth: Payer: Self-pay | Admitting: Gastroenterology

## 2012-11-19 MED ORDER — FERREX 150 FORTE PLUS 50-100 MG PO CAPS: 1.0000 | ORAL_CAPSULE | Freq: Every day | ORAL | Status: AC

## 2012-11-19 MED ORDER — CYANOCOBALAMIN 1000 MCG/ML IJ SOLN: 1000.0000 ug | INTRAMUSCULAR | Status: DC

## 2012-11-19 NOTE — Telephone Encounter (Signed)
RX sent

## 2012-11-19 NOTE — Telephone Encounter (Signed)
Patient stated that iron prescription is not covered by their insurance and cannot afford it.  Patient said that he purchased iron over the counter and it was the same as the prescription.

## 2012-12-13 ENCOUNTER — Other Ambulatory Visit: Payer: Self-pay | Admitting: Gastroenterology

## 2012-12-29 ENCOUNTER — Other Ambulatory Visit: Payer: Self-pay | Admitting: Physician Assistant

## 2013-01-19 ENCOUNTER — Other Ambulatory Visit: Payer: Self-pay | Admitting: Gastroenterology

## 2013-01-31 ENCOUNTER — Other Ambulatory Visit: Payer: Self-pay | Admitting: *Deleted

## 2013-01-31 MED ORDER — RIVAROXABAN 15 MG PO TABS: 15.0000 mg | ORAL_TABLET | Freq: Every day | ORAL | Status: DC

## 2013-01-31 NOTE — Telephone Encounter (Signed)
Fax Received. Refill Completed. Cory Lawrence (R.M.A)   

## 2013-02-06 ENCOUNTER — Other Ambulatory Visit: Payer: Self-pay | Admitting: Physician Assistant

## 2013-02-15 ENCOUNTER — Other Ambulatory Visit: Payer: Self-pay | Admitting: Physician Assistant

## 2013-02-15 ENCOUNTER — Other Ambulatory Visit: Payer: Self-pay | Admitting: Nurse Practitioner

## 2013-02-15 ENCOUNTER — Other Ambulatory Visit: Payer: Self-pay | Admitting: Emergency Medicine

## 2013-02-19 ENCOUNTER — Other Ambulatory Visit: Payer: Self-pay | Admitting: Emergency Medicine

## 2013-02-20 NOTE — Telephone Encounter (Signed)
Forward to Dr. [name] 

## 2013-02-26 ENCOUNTER — Encounter: Payer: Self-pay | Admitting: Emergency Medicine

## 2013-02-26 ENCOUNTER — Ambulatory Visit (INDEPENDENT_AMBULATORY_CARE_PROVIDER_SITE_OTHER): Payer: Medicare Other | Admitting: Emergency Medicine

## 2013-02-26 VITALS — BP 132/52 | HR 87 | Temp 97.9°F | Resp 16 | Ht 64.0 in | Wt 141.0 lb

## 2013-02-26 DIAGNOSIS — R634 Abnormal weight loss: Secondary | ICD-10-CM

## 2013-02-26 DIAGNOSIS — J449 Chronic obstructive pulmonary disease, unspecified: Secondary | ICD-10-CM

## 2013-02-26 DIAGNOSIS — E119 Type 2 diabetes mellitus without complications: Secondary | ICD-10-CM

## 2013-02-26 NOTE — Progress Notes (Signed)
  Subjective:    Patient ID: Cory Lawrence, male    DOB: 12/06/1924, 79 y.o.   MRN: [id number]  HPI patient here to followup COPD diabetes. He was seen recently by his GI doctor Dr. Jarold Motto to followup on his biliary cirrhosis. He also is followed closely by the cardiologist because of his atrial fibrillation and coronary disease. He has no specific complaints today. He he has not been using his protein shakes like he should but overall has been doing well. He's been very active working in the yard.    Review of Systems     Objective:   Physical Exam patient is alert cooperative tearful today. His chest was clear. He has increased AP diameter with diminished breath sounds in the bases cardiac a slightly irregular with a controlled rate. Extremities are without edema.    Results for orders placed in visit on 02/26/13  GLUCOSE, POCT (MANUAL RESULT ENTRY)      Result Value Range   POC Glucose 152 (*) 70 - 99 mg/dl  POCT GLYCOSYLATED HEMOGLOBIN (HGB A1C)      Result Value Range   Hemoglobin A1C 5.5        Assessment & Plan:  Patient is doing well. His spirits are much better today. He would try and increase his use of protein shakes

## 2013-03-18 ENCOUNTER — Other Ambulatory Visit: Payer: Self-pay | Admitting: Physician Assistant

## 2013-04-09 ENCOUNTER — Other Ambulatory Visit: Payer: Self-pay | Admitting: *Deleted

## 2013-04-09 MED ORDER — ISOSORBIDE MONONITRATE ER 30 MG PO TB24: 30.0000 mg | ORAL_TABLET | Freq: Every day | ORAL | Status: DC

## 2013-04-09 NOTE — Telephone Encounter (Signed)
Fax Received. Refill Completed. Cory Lawrence (R.M.A)   

## 2013-04-13 ENCOUNTER — Other Ambulatory Visit: Payer: Self-pay | Admitting: Cardiovascular Disease

## 2013-04-15 NOTE — Telephone Encounter (Signed)
Fax Received. Refill Completed. Harrie Cazarez Chowoe (R.M.A)  NEED APPOINTMENT 

## 2013-05-28 ENCOUNTER — Other Ambulatory Visit: Payer: Self-pay | Admitting: Cardiovascular Disease

## 2013-05-28 NOTE — Telephone Encounter (Signed)
Fax Received. Refill Completed. Cory Lawrence (R.M.A)  NO REFILLS UNTIL APPOINTMENT 05-30-2013

## 2013-05-30 ENCOUNTER — Encounter: Payer: Self-pay | Admitting: Cardiovascular Disease

## 2013-05-30 ENCOUNTER — Ambulatory Visit (INDEPENDENT_AMBULATORY_CARE_PROVIDER_SITE_OTHER): Payer: Medicare Other | Admitting: Cardiovascular Disease

## 2013-05-30 VITALS — BP 140/70 | HR 68 | Ht 64.0 in | Wt 139.8 lb

## 2013-05-30 DIAGNOSIS — I2510 Atherosclerotic heart disease of native coronary artery without angina pectoris: Secondary | ICD-10-CM

## 2013-05-30 DIAGNOSIS — I4891 Unspecified atrial fibrillation: Secondary | ICD-10-CM

## 2013-05-30 NOTE — Patient Instructions (Addendum)
Your physician wants you to follow-up in: 6 months  You will receive a reminder letter in the mail two months in advance. If you don't receive a letter, please call our office to schedule the follow-up appointment.  Your physician recommends that you return for a FASTING lipid profile:6 months  Your physician recommends that you continue on your current medications as directed. Please refer to the Current Medication list given to you today.  KEEP WALKING ;)

## 2013-05-30 NOTE — Assessment & Plan Note (Signed)
Cory Lawrence is doing well. Will continue the same medications. He has not had any episodes of chest pain.

## 2013-05-30 NOTE — Assessment & Plan Note (Signed)
He has maintained sinus rhythm.

## 2013-05-30 NOTE — Progress Notes (Signed)
Cory Lawrence Date of Birth  11/07/1924       [facility]    [place] [address]  [address] [place], [place]  [address]   [place], [place]  [address] [phone]     [phone]   Fax  [phone]    Fax [phone]  Problem List: 1. Coronary artery disease-medical therapy 2. Paroxysmal atrial fibrillation 3. Diabetes mellitus 4. Hypertension  History of Present Illness:  Cory Lawrence is an 79 year old gentleman with a history of moderate coronary artery disease. We've been treating him medically. He also has had some paroxysmal atrial fibrillation. There'll seems to be getting along a little bit better.  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    May 30, 2013:   Cory Lawrence is doing well.  No CP  Current Outpatient Prescriptions on File Prior to Visit  Medication Sig Dispense Refill  . ALPRAZolam (XANAX) 0.5 MG tablet TAKE 1 TABLET BY MOUTH AT BEDTIME AS NEEDED  30 tablet  4  . cyanocobalamin (,VITAMIN B-12,) 1000 MCG/ML injection INJECT 1 ML (1,000 MCG TOTAL) INTO THE MUSCLE EVERY 30 (THIRTY) DAYS.  10 mL  0  . DIGOX 125 MCG tablet TAKE 1 TABLET (125 MCG TOTAL) BY MOUTH DAILY.  30 tablet  3  . doxazosin (CARDURA) 4 MG tablet TAKE 1 TABLET AT BEDTIME  30 tablet  2  . Fe-Succ Ac-C-Thre Ac-B12-FA (FERREX 150 FORTE PLUS) 50-100 MG CAPS Take 1 capsule by mouth daily.  90 each  3  . Fluticasone-Salmeterol (ADVAIR DISKUS) 250-50 MCG/DOSE AEPB Inhale 1 puff into the lungs as needed. Shortness of breath.      . Ipratropium-Albuterol (COMBIVENT IN) Inhale 1 puff into the lungs as needed. Shortness of breath.      . isosorbide mononitrate (IMDUR) 30 MG 24 hr tablet Take 1 tablet (30 mg total) by mouth daily.  30 tablet  3  . Melatonin 3 MG CAPS Take 1.5 mg by mouth at bedtime as needed. Sleep       . Multiple Vitamin (MULTIVITAMIN) tablet Take 1 tablet by mouth daily.        . Omega-3 Fatty Acids (FISH OIL) 1000 MG  CAPS Take 1,000 mg by mouth daily.       . pantoprazole (PROTONIX) 40 MG tablet TAKE 1 TABLET BY MOUTH DAILY  34 tablet  11  . simvastatin (ZOCOR) 40 MG tablet TAKE 1 TABLET AT BEDTIME.  90 tablet  1  . triamcinolone ointment (KENALOG) 0.1 % APPLY TO AFFECTED AREA TWICE A DAY  30 g  1  . ursodiol (ACTIGALL) 250 MG tablet TAKE 1 TABLET BY MOUTH 2 (TWO) TIMES DAILY.  180 tablet  1  . vitamin E 400 UNIT capsule Take 400 Units by mouth daily.        Carlena Hurl 15 MG TABS tablet TAKE 1 TABLET (15 MG TOTAL) BY MOUTH DAILY.  30 tablet  0   No current facility-administered medications on file prior to visit.    Allergies  Allergen Reactions  . Penicillins Hives    Past Medical History  Diagnosis Date  . Dizziness   . Diaphoresis   . Palpitations   . COPD (chronic obstructive pulmonary disease)   . Hypertension   . Hyperlipidemia   . GERD (gastroesophageal reflux disease)   . BPH (benign prostatic hypertrophy)   . Personal history of colonic polyps 07/08/2002,06/2011    tubular adenoma, hyperplastic  .  Internal hemorrhoids without mention of complication   . Diverticulosis of colon (without mention of hemorrhage)   . Biliary cirrhosis   . Unspecified gastritis and gastroduodenitis without mention of hemorrhage   . NASH (nonalcoholic steatohepatitis)   . Anxiety   . Arthritis   . Ulcer     Gastric/bleeding  . Diabetes mellitus type 2, diet-controlled     managed with diet  . Vitamin B12 deficiency   . PAF (paroxysmal atrial fibrillation)     on Xarelto  . CAD (coronary artery disease)     prior abnormal Myoview; subsequent cath; managed medically  . Heart attack     Past Surgical History  Procedure Laterality Date  . Cataract extraction      bilateral  . Eye surgery      left eye x multiple  . Cardiac catheterization  March 2013    Managed medically    History  Smoking status  . Former Smoker  Smokeless tobacco  . Former Neurosurgeon  . Quit date: 05/31/1996    History   Alcohol Use No    Family History  Problem Relation Age of Onset  . Heart failure Mother   . Diabetes Mother   . Diabetes Sister   . Heart failure Sister   . Colon cancer Neg Hx   . Cancer Mother     unknown type    Reviw of Systems:  Reviewed in the HPI.  All other systems are negative.  Physical Exam: Blood pressure 140/70, pulse 68, height 5\' 4"  (1.626 m), weight 139 lb 12.8 oz (63.413 kg). General: Well developed, well nourished, in no acute distress.  Head: Normocephalic, atraumatic, sclera non-icteric, mucus membranes are moist,   Neck: Supple. Carotids are 2 + without bruits. No JVD  Lungs: Clear bilaterally to auscultation.  Heart: regular rate.  normal  S1 S2. No murmurs, gallops or rubs.  Abdomen: Soft, non-tender, non-distended with normal bowel sounds. No hepatomegaly. No rebound/guarding. No masses.  Msk:  Strength and tone are normal  Extremities: No clubbing or cyanosis. No edema.  Distal pedal pulses are 2+ and equal bilaterally.  Neuro: Alert and oriented X 3. Moves all extremities spontaneously.  Psych:  Responds to questions appropriately with a normal affect.  ECG: May 30, 2013:  NSR with 1st degree AV block.  NS ST abn.  Assessment / Plan:

## 2013-06-14 ENCOUNTER — Other Ambulatory Visit: Payer: Self-pay | Admitting: Gastroenterology

## 2013-06-24 ENCOUNTER — Other Ambulatory Visit: Payer: Self-pay | Admitting: Cardiovascular Disease

## 2013-07-02 ENCOUNTER — Encounter: Payer: Self-pay | Admitting: Emergency Medicine

## 2013-07-02 ENCOUNTER — Ambulatory Visit (INDEPENDENT_AMBULATORY_CARE_PROVIDER_SITE_OTHER): Payer: Medicare PPO | Admitting: Emergency Medicine

## 2013-07-02 ENCOUNTER — Other Ambulatory Visit: Payer: Self-pay | Admitting: Emergency Medicine

## 2013-07-02 VITALS — BP 138/58 | HR 84 | Temp 98.0°F | Resp 16 | Ht 64.0 in | Wt 137.0 lb

## 2013-07-02 DIAGNOSIS — Z23 Encounter for immunization: Secondary | ICD-10-CM

## 2013-07-02 DIAGNOSIS — J441 Chronic obstructive pulmonary disease with (acute) exacerbation: Secondary | ICD-10-CM

## 2013-07-02 DIAGNOSIS — K7460 Unspecified cirrhosis of liver: Secondary | ICD-10-CM

## 2013-07-02 DIAGNOSIS — R634 Abnormal weight loss: Secondary | ICD-10-CM

## 2013-07-02 DIAGNOSIS — R7309 Other abnormal glucose: Secondary | ICD-10-CM

## 2013-07-02 DIAGNOSIS — R739 Hyperglycemia, unspecified: Secondary | ICD-10-CM

## 2013-07-02 DIAGNOSIS — E538 Deficiency of other specified B group vitamins: Secondary | ICD-10-CM

## 2013-07-02 LAB — COMPREHENSIVE METABOLIC PANEL
Alkaline Phosphatase: 73 U/L (ref 39–117)
BUN: 25 mg/dL — ABNORMAL HIGH (ref 6–23)
Creat: 1.14 mg/dL (ref 0.50–1.35)
Glucose, Bld: 139 mg/dL — ABNORMAL HIGH (ref 70–99)
Total Bilirubin: 0.4 mg/dL (ref 0.3–1.2)

## 2013-07-02 LAB — CBC WITH DIFFERENTIAL/PLATELET
Basophils Relative: 0 % (ref 0–1)
Eosinophils Absolute: 0.1 10*3/uL (ref 0.0–0.7)
HCT: 32.7 % — ABNORMAL LOW (ref 39.0–52.0)
Hemoglobin: 11.1 g/dL — ABNORMAL LOW (ref 13.0–17.0)
MCH: 31.4 pg (ref 26.0–34.0)
MCHC: 33.9 g/dL (ref 30.0–36.0)
Monocytes Absolute: 0.3 10*3/uL (ref 0.1–1.0)
Monocytes Relative: 6 % (ref 3–12)
Neutrophils Relative %: 75 % (ref 43–77)

## 2013-07-02 LAB — GLUCOSE, POCT (MANUAL RESULT ENTRY): POC Glucose: 173 mg/dl — AB (ref 70–99)

## 2013-07-02 NOTE — Progress Notes (Signed)
  Subjective:    Patient ID: Bryceton Hantz, male    DOB: 03/16/1925, 79 y.o.   MRN: [id number]  HPI patient in for follow up of COPD. His hypertension and hyperlipidemia. He has a history of atrial fibrillation followed by Dr. Elease Hashimoto on Xarelto.  he sees Dr. Margarita Grizzle on regular basis and also is followed by Dr. Jarold Motto for nonalcoholic cirrhosis. He continues to struggle with depression over his son's incarceration he has been feeling some better with this but continues to have issues keeping his weight up.    Review of Systems     Objective:   Physical ExamHEENT exam is unremarkable neck supple chest clear there is increased AP diameter with diminished breath sounds in the bases. Extremities are without edema.  Results for orders placed in visit on 07/02/13  GLUCOSE, POCT (MANUAL RESULT ENTRY)      Result Value Range   POC Glucose 173 (*) 70 - 99 mg/dl  POCT GLYCOSYLATED HEMOGLOBIN (HGB A1C)      Result Value Range   Hemoglobin A1C 5.5          Assessment & Plan:     Will increase his protein shakes to twice a day. Recheck in about 4 months. He also had a flu shot previously. Routine labs were done

## 2013-07-03 ENCOUNTER — Other Ambulatory Visit: Payer: Self-pay | Admitting: Cardiovascular Disease

## 2013-07-03 LAB — VITAMIN B12: Vitamin B-12: 464 pg/mL (ref 211–911)

## 2013-07-04 LAB — IRON AND TIBC
Iron: 72 ug/dL (ref 42–165)
UIBC: 192 ug/dL (ref 125–400)

## 2013-07-04 LAB — FERRITIN: Ferritin: 55 ng/mL (ref 22–322)

## 2013-07-09 ENCOUNTER — Other Ambulatory Visit: Payer: Self-pay | Admitting: Emergency Medicine

## 2013-07-12 ENCOUNTER — Other Ambulatory Visit: Payer: Self-pay | Admitting: Radiology

## 2013-07-12 NOTE — Telephone Encounter (Signed)
Faxed Rx

## 2013-07-23 ENCOUNTER — Other Ambulatory Visit: Payer: Self-pay | Admitting: Cardiovascular Disease

## 2013-07-31 ENCOUNTER — Other Ambulatory Visit: Payer: Self-pay | Admitting: Cardiovascular Disease

## 2013-08-11 ENCOUNTER — Other Ambulatory Visit: Payer: Self-pay | Admitting: Cardiovascular Disease

## 2013-09-05 ENCOUNTER — Other Ambulatory Visit: Payer: Self-pay | Admitting: Emergency Medicine

## 2013-11-10 ENCOUNTER — Other Ambulatory Visit: Payer: Self-pay | Admitting: Gastroenterology

## 2013-11-11 NOTE — Telephone Encounter (Signed)
PLEASE MAKE AN OFFICE VISIT FOR FURTHER REFILLS  

## 2013-11-12 ENCOUNTER — Ambulatory Visit (INDEPENDENT_AMBULATORY_CARE_PROVIDER_SITE_OTHER): Payer: Medicare PPO | Admitting: Emergency Medicine

## 2013-11-12 ENCOUNTER — Encounter: Payer: Self-pay | Admitting: Emergency Medicine

## 2013-11-12 VITALS — BP 169/62 | HR 64 | Temp 98.0°F | Resp 16 | Ht 63.75 in | Wt 134.4 lb

## 2013-11-12 DIAGNOSIS — J4489 Other specified chronic obstructive pulmonary disease: Secondary | ICD-10-CM

## 2013-11-12 DIAGNOSIS — R739 Hyperglycemia, unspecified: Secondary | ICD-10-CM

## 2013-11-12 DIAGNOSIS — I2510 Atherosclerotic heart disease of native coronary artery without angina pectoris: Secondary | ICD-10-CM

## 2013-11-12 DIAGNOSIS — R7309 Other abnormal glucose: Secondary | ICD-10-CM

## 2013-11-12 DIAGNOSIS — J449 Chronic obstructive pulmonary disease, unspecified: Secondary | ICD-10-CM

## 2013-11-12 DIAGNOSIS — E785 Hyperlipidemia, unspecified: Secondary | ICD-10-CM

## 2013-11-12 DIAGNOSIS — K7581 Nonalcoholic steatohepatitis (NASH): Secondary | ICD-10-CM

## 2013-11-12 LAB — COMPLETE METABOLIC PANEL WITH GFR
ALBUMIN: 3.5 g/dL (ref 3.5–5.2)
ALT: 15 U/L (ref 0–53)
AST: 19 U/L (ref 0–37)
Alkaline Phosphatase: 82 U/L (ref 39–117)
BUN: 27 mg/dL — AB (ref 6–23)
CALCIUM: 8.3 mg/dL — AB (ref 8.4–10.5)
CHLORIDE: 104 meq/L (ref 96–112)
CO2: 30 meq/L (ref 19–32)
Creat: 1 mg/dL (ref 0.50–1.35)
GFR, EST AFRICAN AMERICAN: 78 mL/min
GFR, EST NON AFRICAN AMERICAN: 68 mL/min
GLUCOSE: 92 mg/dL (ref 70–99)
POTASSIUM: 3.7 meq/L (ref 3.5–5.3)
Sodium: 141 mEq/L (ref 135–145)
Total Bilirubin: 0.5 mg/dL (ref 0.3–1.2)
Total Protein: 6.3 g/dL (ref 6.0–8.3)

## 2013-11-12 LAB — LIPID PANEL
CHOLESTEROL: 113 mg/dL (ref 0–200)
HDL: 41 mg/dL (ref >39)
LDL Cholesterol: 51 mg/dL (ref 0–99)
Total CHOL/HDL Ratio: 2.8 Ratio
Triglycerides: 104 mg/dL (ref <150)
VLDL: 21 mg/dL (ref 0–40)

## 2013-11-12 LAB — CBC WITH DIFFERENTIAL/PLATELET
Basophils Absolute: 0 10*3/uL (ref 0.0–0.1)
Basophils Relative: 0 % (ref 0–1)
Eosinophils Absolute: 0.1 10*3/uL (ref 0.0–0.7)
Eosinophils Relative: 1 % (ref 0–5)
HEMATOCRIT: 32.8 % — AB (ref 39.0–52.0)
HEMOGLOBIN: 11.2 g/dL — AB (ref 13.0–17.0)
LYMPHS ABS: 1.4 10*3/uL (ref 0.7–4.0)
LYMPHS PCT: 23 % (ref 12–46)
MCH: 31.5 pg (ref 26.0–34.0)
MCHC: 34.1 g/dL (ref 30.0–36.0)
MCV: 92.1 fL (ref 78.0–100.0)
MONO ABS: 0.4 10*3/uL (ref 0.1–1.0)
Monocytes Relative: 6 % (ref 3–12)
NEUTROS ABS: 4.3 10*3/uL (ref 1.7–7.7)
Neutrophils Relative %: 70 % (ref 43–77)
Platelets: 171 10*3/uL (ref 150–400)
RBC: 3.56 MIL/uL — ABNORMAL LOW (ref 4.22–5.81)
RDW: 14.8 % (ref 11.5–15.5)
WBC: 6.2 10*3/uL (ref 4.0–10.5)

## 2013-11-12 LAB — GLUCOSE, POCT (MANUAL RESULT ENTRY): POC Glucose: 107 mg/dl — AB (ref 70–99)

## 2013-11-12 LAB — POCT GLYCOSYLATED HEMOGLOBIN (HGB A1C): HEMOGLOBIN A1C: 5.3

## 2013-11-12 NOTE — Progress Notes (Addendum)
 Subjective:    Patient ID: Cory Lawrence, male    DOB: 06/30/1925, 80 y.o.   MRN: [id number]  HPI This chart was scribed for Cory Lawrence, by [name], Scribe. This patient was seen in room 23 and the patient's care was started at 9:40 AM.  HPI Comments: Cory Lawrence is a 80 y.o. male who presents to the [facility] w/ hx of COPD for his six month checkup. He states that he has been doing well and has had no recent illnesses; he had the flu shot this year. He denies any CP, SOB or abdominal pain. He states his breathing has been doing well. He normally walks about 1 mile per day and at least 3 miles per week. He is due to see Dr. Nahser, his cardiologist next month after it has 6 months since he has seen him. He has a hx of NASH and type 2 Diabetes.  Patient Active Problem List   Diagnosis Date Noted  . LV dysfunction 03/02/2012  . Atrial Fibrillation 02/22/2012  . Vitamin B12 deficiency 09/29/2011  . COPD with bronchitis 09/29/2011  . History of gastroesophageal reflux (GERD) 09/29/2011  . Diabetes mellitus type 2, diet-controlled   . Other B-complex deficiencies 05/24/2011  . Other general symptoms  05/17/2011  . NASH (nonalcoholic steatohepatitis) 05/17/2011  . Cirrhosis of liver not due to alcohol  05/17/2011  . Personal history of colonic polyps 05/17/2011  . CAD (coronary artery disease) 03/17/2011  . Syncope and collapse   . Dizziness   . Diaphoresis   . Chest pain   . Chest tightness   . Palpitations   . COPD (chronic obstructive pulmonary disease)   . Hypertension   . GERD (gastroesophageal reflux disease)   . BPH (benign prostatic hypertrophy)   . HYPERLIPIDEMIA 11/19/2007  . OBESITY 11/19/2007  . HYPERTENSION 11/19/2007  . INTERNAL HEMORRHOIDS 11/19/2007  . COPD 11/19/2007  . GERD 11/19/2007  . DIVERTICULOSIS, COLON 11/19/2007  . BILIARY CIRRHOSIS, PRIMARY 11/19/2007  . BENIGN PROSTATIC HYPERTROPHY, HX OF 11/19/2007  .  COLONIC POLYPS, ADENOMATOUS 07/08/2002   Past Surgical History  Procedure Laterality Date  . Cataract extraction      bilateral  . Eye surgery      left eye x multiple  . Cardiac catheterization  March 2013    Managed medically   Family History  Problem Relation Age of Onset  . Heart failure Mother   . Diabetes Mother   . Diabetes Sister   . Heart failure Sister   . Colon cancer Neg Hx   . Cancer Mother     unknown type   History   Social History  . Marital Status: Married    Spouse Name: N/A    Number of Children: 2  . Years of Education: N/A   Occupational History  . retired    Social History Main Topics  . Smoking status: Former Smoker  . Smokeless tobacco: Former User    Quit date: 05/31/1996  . Alcohol Use: No  . Drug Use: No  . Sexual Activity: No   Other Topics Concern  . Not on file   Social History Narrative  . No narrative on file   Allergies  Allergen Reactions  . Penicillins Hives   Results for orders placed in visit on 07/02/13  CBC WITH DIFFERENTIAL      Result Value Range   WBC 5.9  4.0 - 10.5 K/uL   RBC 3.53 (*) 4.22 -   5.81 MIL/uL   Hemoglobin 11.1 (*) 13.0 - 17.0 g/dL   HCT 32.7 (*) 39.0 - 52.0 %   MCV 92.6  78.0 - 100.0 fL   MCH 31.4  26.0 - 34.0 pg   MCHC 33.9  30.0 - 36.0 g/dL   RDW 14.6  11.5 - 15.5 %   Platelets 156  150 - 400 K/uL   Neutrophils Relative % 75  43 - 77 %   Neutro Abs 4.4  1.7 - 7.7 K/uL   Lymphocytes Relative 17  12 - 46 %   Lymphs Abs 1.0  0.7 - 4.0 K/uL   Monocytes Relative 6  3 - 12 %   Monocytes Absolute 0.3  0.1 - 1.0 K/uL   Eosinophils Relative 2  0 - 5 %   Eosinophils Absolute 0.1  0.0 - 0.7 K/uL   Basophils Relative 0  0 - 1 %   Basophils Absolute 0.0  0.0 - 0.1 K/uL   Smear Review Criteria for review not met    COMPREHENSIVE METABOLIC PANEL      Result Value Range   Sodium 139  135 - 145 mEq/L   Potassium 3.8  3.5 - 5.3 mEq/L   Chloride 104  96 - 112 mEq/L   CO2 25  19 - 32 mEq/L   Glucose,  Bld 139 (*) 70 - 99 mg/dL   BUN 25 (*) 6 - 23 mg/dL   Creat 1.14  0.50 - 1.35 mg/dL   Total Bilirubin 0.4  0.3 - 1.2 mg/dL   Alkaline Phosphatase 73  39 - 117 U/L   AST 17  0 - 37 U/L   ALT 15  0 - 53 U/L   Total Protein 6.1  6.0 - 8.3 g/dL   Albumin 3.6  3.5 - 5.2 g/dL   Calcium 8.5  8.4 - 10.5 mg/dL  VITAMIN B12      Result Value Range   Vitamin B-12 464  211 - 911 pg/mL  GLUCOSE, POCT (MANUAL RESULT ENTRY)      Result Value Range   POC Glucose 173 (*) 70 - 99 mg/dl  POCT GLYCOSYLATED HEMOGLOBIN (HGB A1C)      Result Value Range   Hemoglobin A1C 5.5     Review of Systems  Constitutional: Negative for fever and chills.  Respiratory: Negative for cough and shortness of breath.   Cardiovascular: Negative for chest pain.  Gastrointestinal: Negative for abdominal pain.  Musculoskeletal: Negative for back pain.      Objective:   Physical Exam Nursing note and vitals reviewed. Constitutional: Patient is oriented to person, place, and time. Patient appears well-developed and well-nourished. No distress.  HENT:  Head: Normocephalic and atraumatic.  Neck: Neck supple. No tracheal deviation present.  Cardiovascular: Normal rate, regular rhythm and normal heart sounds.   No murmur heard. Pulmonary/Chest: Effort normal. Few dry rales right base. Breath sounds are symmetrical. No respiratory distress. Patient has no wheezes. Patient has no rales.  Musculoskeletal: Normal range of motion.  Neurological: Patient is alert and oriented to person, place, and time.  Skin: Skin is warm and dry.  Psychiatric: Patient has a normal mood and affect. Patient's behavior is normal.  Rectal exam reveals a normal-sized prostate without nodularity. Triage Vitals: BP 169/62  Pulse 64  Temp(Src) 98 F (36.7 C) (Oral)  Resp 16  Ht 5' 3.75" (1.619 m)  Wt 134 lb 6.4 oz (60.963 kg)  BMI 23.26 kg/m2  SpO2 96%  DIAGNOSTIC STUDIES: Oxygen Saturation  is 95% on room air, adequate by my interpretation.    Results for orders placed in visit on 11/12/13  GLUCOSE, POCT (MANUAL RESULT ENTRY)      Result Value Range   POC Glucose 107 (*) 70 - 99 mg/dl  POCT GLYCOSYLATED HEMOGLOBIN (HGB A1C)      Result Value Range   Hemoglobin A1C 5.3        Assessment & Plan:  Patient did well with his weight loss his A1c is now normal we'll recheck in 3 months. I personally performed the services described in this documentation, which was scribed in my presence. The recorded information has been reviewed and is accurate.

## 2013-11-14 ENCOUNTER — Encounter: Payer: Self-pay | Admitting: Cardiovascular Disease

## 2013-11-19 ENCOUNTER — Other Ambulatory Visit: Payer: Self-pay | Admitting: Emergency Medicine

## 2013-11-19 ENCOUNTER — Telehealth: Payer: Self-pay

## 2013-11-19 DIAGNOSIS — R21 Rash and other nonspecific skin eruption: Secondary | ICD-10-CM

## 2013-11-19 MED ORDER — TRIAMCINOLONE ACETONIDE 0.1 % EX OINT: TOPICAL_OINTMENT | CUTANEOUS | Status: DC

## 2013-11-19 NOTE — Telephone Encounter (Signed)
Patients wife is calling to say that husband needs refill on his triamcinolone if possible pharmacy told her to call us he forgot to tell us the other day when he was in here [phone] with questions

## 2013-11-19 NOTE — Telephone Encounter (Signed)
Call patient and let him know I sent in a prescription for his steroid cream

## 2013-11-19 NOTE — Telephone Encounter (Signed)
Dr. [name], please advise.   

## 2013-11-20 NOTE — Telephone Encounter (Signed)
Pt advised.

## 2013-11-30 ENCOUNTER — Other Ambulatory Visit: Payer: Self-pay | Admitting: Cardiovascular Disease

## 2013-12-11 ENCOUNTER — Other Ambulatory Visit: Payer: Self-pay | Admitting: Physician Assistant

## 2013-12-11 ENCOUNTER — Ambulatory Visit (INDEPENDENT_AMBULATORY_CARE_PROVIDER_SITE_OTHER): Payer: Medicare FFS | Admitting: Cardiovascular Disease

## 2013-12-11 ENCOUNTER — Other Ambulatory Visit: Payer: Self-pay | Admitting: Emergency Medicine

## 2013-12-11 ENCOUNTER — Encounter: Payer: Self-pay | Admitting: Cardiovascular Disease

## 2013-12-11 ENCOUNTER — Other Ambulatory Visit: Payer: Self-pay | Admitting: Cardiology

## 2013-12-11 VITALS — BP 132/64 | HR 70 | Ht 63.75 in | Wt 136.4 lb

## 2013-12-11 DIAGNOSIS — R634 Abnormal weight loss: Secondary | ICD-10-CM

## 2013-12-11 DIAGNOSIS — I4891 Unspecified atrial fibrillation: Secondary | ICD-10-CM

## 2013-12-11 DIAGNOSIS — I10 Essential (primary) hypertension: Secondary | ICD-10-CM

## 2013-12-11 DIAGNOSIS — I2510 Atherosclerotic heart disease of native coronary artery without angina pectoris: Secondary | ICD-10-CM

## 2013-12-11 LAB — TSH: TSH: 1.21 u[IU]/mL (ref 0.35–5.50)

## 2013-12-11 NOTE — Assessment & Plan Note (Signed)
His rhythm remained stable. Clinically, he appears to be in normal sinus rhythm.

## 2013-12-11 NOTE — Assessment & Plan Note (Signed)
He continues to lose weight. His wife states that he occasionally has some tremulousness and the jitters. We'll check a TSH today. I have encouraged him to contact his medical doctor for further evaluation of his weight loss.

## 2013-12-11 NOTE — Assessment & Plan Note (Signed)
He has an occluded right coronary artery with intact left right collateral filling. He has mild disease elsewhere. Continue his current medications. We will continue with his current dose of simvastatin. His lipid levels have been quite good.

## 2013-12-11 NOTE — Progress Notes (Signed)
Cory Lawrence Date of Birth  [birth year]       [facility]    [employer] [address] [place], [place], [place]  [address]   [place], [place]  [address] [phone]     [phone]   Fax  [phone]    Fax [phone]  Problem List: 1. Coronary artery disease-chronic total occlusion of the right coronary artery with intact left to right collateral filling.    medical therapy 2. Paroxysmal atrial fibrillation 3. Diabetes mellitus 4. Hypertension  History of Present Illness:  Cory Lawrence is an 80 year old gentleman with a history of moderate coronary artery disease. We've been treating him medically. He also has had some paroxysmal atrial fibrillation. There'll seems to be getting along a little bit better.  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    May 30, 2013:   Cory Lawrence is doing well.  No CP  Feb. 11, 2015:  Cory Lawrence is doing ok.  He is losing weight.  He has lost 50 lbs over the past several years.  His last TSH level was drawn 2 years ago. No CP or dyspnea. Does have a hx of renal cell cancer.     Current Outpatient Prescriptions on File Prior to Visit  Medication Sig Dispense Refill  . ALPRAZolam (XANAX) 0.5 MG tablet TAKE 1 TABLET BY MOUTH EVERY DAY AT BEDTIME AS NEEDED  30 tablet  4  . cyanocobalamin (,VITAMIN B-12,) 1000 MCG/ML injection INJECT 1 ML (1,000 MCG TOTAL) INTO THE MUSCLE EVERY 30 (THIRTY) DAYS.  10 mL  0  . digoxin (LANOXIN) 0.125 MG tablet TAKE 1 TABLET BY MOUTH DAILY  30 tablet  3  . doxazosin (CARDURA) 4 MG tablet TAKE 1 TABLET AT BEDTIME  30 tablet  2  . Fe-Succ Ac-C-Thre Ac-B12-FA (FERREX 150 FORTE PLUS) 50-100 MG CAPS Take 1 capsule by mouth daily.  90 each  3  . finasteride (PROSCAR) 5 MG tablet       . Fluticasone-Salmeterol (ADVAIR DISKUS) 250-50 MCG/DOSE AEPB Inhale 1 puff into the lungs as needed. Shortness of breath.      . Ipratropium-Albuterol (COMBIVENT IN) Inhale 1 puff  into the lungs as needed. Shortness of breath.      . isosorbide mononitrate (IMDUR) 30 MG 24 hr tablet TAKE 1 TABLET (30 MG TOTAL) BY MOUTH DAILY.  30 tablet  0  . Melatonin 3 MG CAPS Take 1.5 mg by mouth at bedtime as needed. Sleep       . Omega-3 Fatty Acids (FISH OIL) 1000 MG CAPS Take 1,000 mg by mouth daily.       . pantoprazole (PROTONIX) 40 MG tablet TAKE 1 TABLET BY MOUTH DAILY  34 tablet  0  . tamsulosin (FLOMAX) 0.4 MG CAPS capsule Take 0.4 mg by mouth.      . triamcinolone ointment (KENALOG) 0.1 % APPLY TO AFFECTED AREA TWICE A DAY  30 g  5  . triamterene-hydrochlorothiazide (DYAZIDE) 37.5-25 MG per capsule Take 1 capsule by mouth daily.      . ursodiol (ACTIGALL) 250 MG tablet TAKE 1 TABLET BY MOUTH TWICE A DAY  180 tablet  1  . XARELTO 15 MG TABS tablet TAKE 1 TABLET DAILY  30 tablet  6   No current facility-administered medications on file prior to visit.    Allergies  Allergen Reactions  . Penicillins Hives    Past Medical History  Diagnosis Date  .  Dizziness   . Diaphoresis   . Palpitations   . COPD (chronic obstructive pulmonary disease)   . Hypertension   . Hyperlipidemia   . GERD (gastroesophageal reflux disease)   . BPH (benign prostatic hypertrophy)   . Personal history of colonic polyps 07/08/2002,06/2011    tubular adenoma, hyperplastic  . Internal hemorrhoids without mention of complication   . Diverticulosis of colon (without mention of hemorrhage)   . Biliary cirrhosis   . Unspecified gastritis and gastroduodenitis without mention of hemorrhage   . NASH (nonalcoholic steatohepatitis)   . Anxiety   . Arthritis   . Ulcer     Gastric/bleeding  . Diabetes mellitus type 2, diet-controlled     managed with diet  . Vitamin B12 deficiency   . PAF (paroxysmal atrial fibrillation)     on Xarelto  . CAD (coronary artery disease)     prior abnormal Myoview; subsequent cath; managed medically  . Heart attack     Past Surgical History  Procedure  Laterality Date  . Cataract extraction      bilateral  . Eye surgery      left eye x multiple  . Cardiac catheterization  March 2013    Managed medically    History  Smoking status  . Former Smoker  Smokeless tobacco  . Former Systems developer  . Quit date: 05/31/1996    History  Alcohol Use No    Family History  Problem Relation Age of Onset  . Heart failure Mother   . Diabetes Mother   . Diabetes Sister   . Heart failure Sister   . Colon cancer Neg Hx   . Cancer Mother     unknown type    Reviw of Systems:  Reviewed in the HPI.  All other systems are negative.  Physical Exam: Blood pressure 132/64, pulse 70, height 5' 3.75" (1.619 m), weight 136 lb 6.4 oz (61.871 kg). General: Well developed, well nourished, in no acute distress. Head: Normocephalic, atraumatic, sclera non-icteric, mucus membranes are moist,  Neck: Supple. Carotids are 2 + without bruits. No JVD Lungs: Clear bilaterally to auscultation. Heart: regular rate.  normal  S1 S2. No murmurs, gallops or rubs. Abdomen: Soft, non-tender, non-distended with normal bowel sounds. No hepatomegaly. No rebound/guarding. No masses. Msk:  Strength and tone are normal Extremities: No clubbing or cyanosis. No edema.  Distal pedal pulses are 2+ and equal bilaterally. Neuro: Alert and oriented X 3. Moves all extremities spontaneously. Psych:  Responds to questions appropriately with a normal affect.  ECG: May 30, 2013:  NSR with 1st degree AV block.  NS ST abn.  Assessment / Plan:

## 2013-12-11 NOTE — Patient Instructions (Signed)
Your physician wants you to follow-up in: [place] will receive a reminder letter in the mail two months in advance. If you don't receive a letter, please call our office to schedule the follow-up appointment.   Your physician recommends that you HAVE LAB WORK TODAY

## 2013-12-11 NOTE — Assessment & Plan Note (Signed)
His blood pressure remained stable. He is actually been off the Maxzide for a year or so. For some reason it has remained on the medication list but he has not taken this medication in a long time.

## 2013-12-12 ENCOUNTER — Telehealth: Payer: Self-pay | Admitting: *Deleted

## 2013-12-12 MED ORDER — ISOSORBIDE MONONITRATE ER 30 MG PO TB24: ORAL_TABLET | ORAL | Status: DC

## 2013-12-12 MED ORDER — DOXAZOSIN MESYLATE 4 MG PO TABS: ORAL_TABLET | ORAL | Status: DC

## 2013-12-12 MED ORDER — DIGOXIN 125 MCG PO TABS: ORAL_TABLET | ORAL | Status: DC

## 2013-12-12 MED ORDER — RIVAROXABAN 15 MG PO TABS: ORAL_TABLET | ORAL | Status: DC

## 2013-12-12 NOTE — Telephone Encounter (Signed)
Pt aware of lab results He also needed refills sent in for his digoxin,cardura,imdur, and xarelto E-scribed refills as pt is almost out of medications (down to 2 pills) Horton Chin RN

## 2013-12-12 NOTE — Telephone Encounter (Signed)
Message copied by Verna Czech on Thu Dec 12, 2013 10:03 AM ------      Message from: Thayer Headings      Created: Thu Dec 12, 2013  5:44 AM       TSH is normal.  His weight loss is not because of abnormal thyroid levels       ------

## 2013-12-18 ENCOUNTER — Other Ambulatory Visit: Payer: Self-pay | Admitting: Gastroenterology

## 2014-01-02 ENCOUNTER — Other Ambulatory Visit: Payer: Self-pay | Admitting: Gastroenterology

## 2014-01-02 IMAGING — CR DG CHEST 2V
2 series · 2 of 2 positions shown · non-contrast
Comparison: CT 03/02/2011

CLINICAL DATA: Preoperative evaluation for cardiac catheterization.
Slight cough

CHEST - 2 VIEW

[w chest pa]
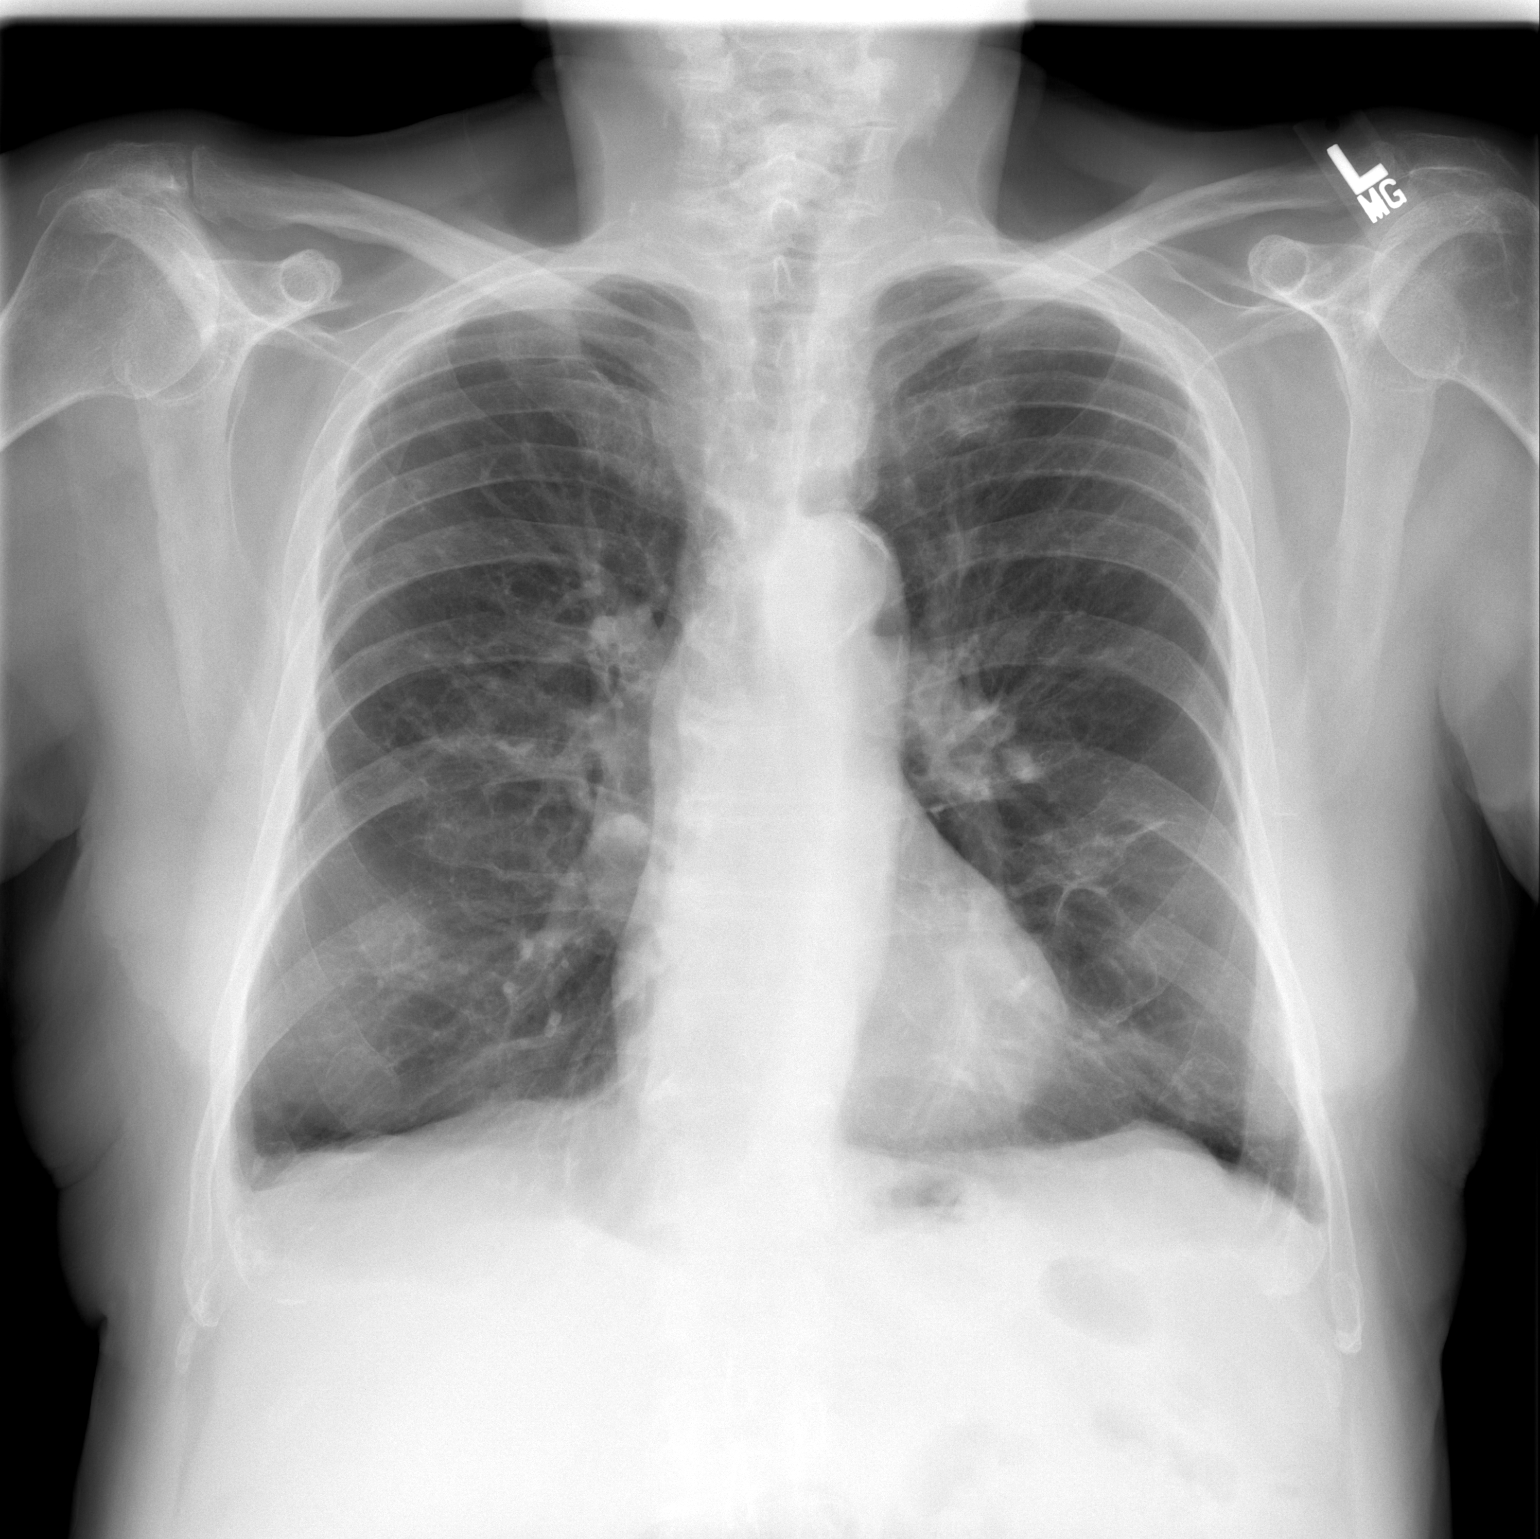

[w chest lat]
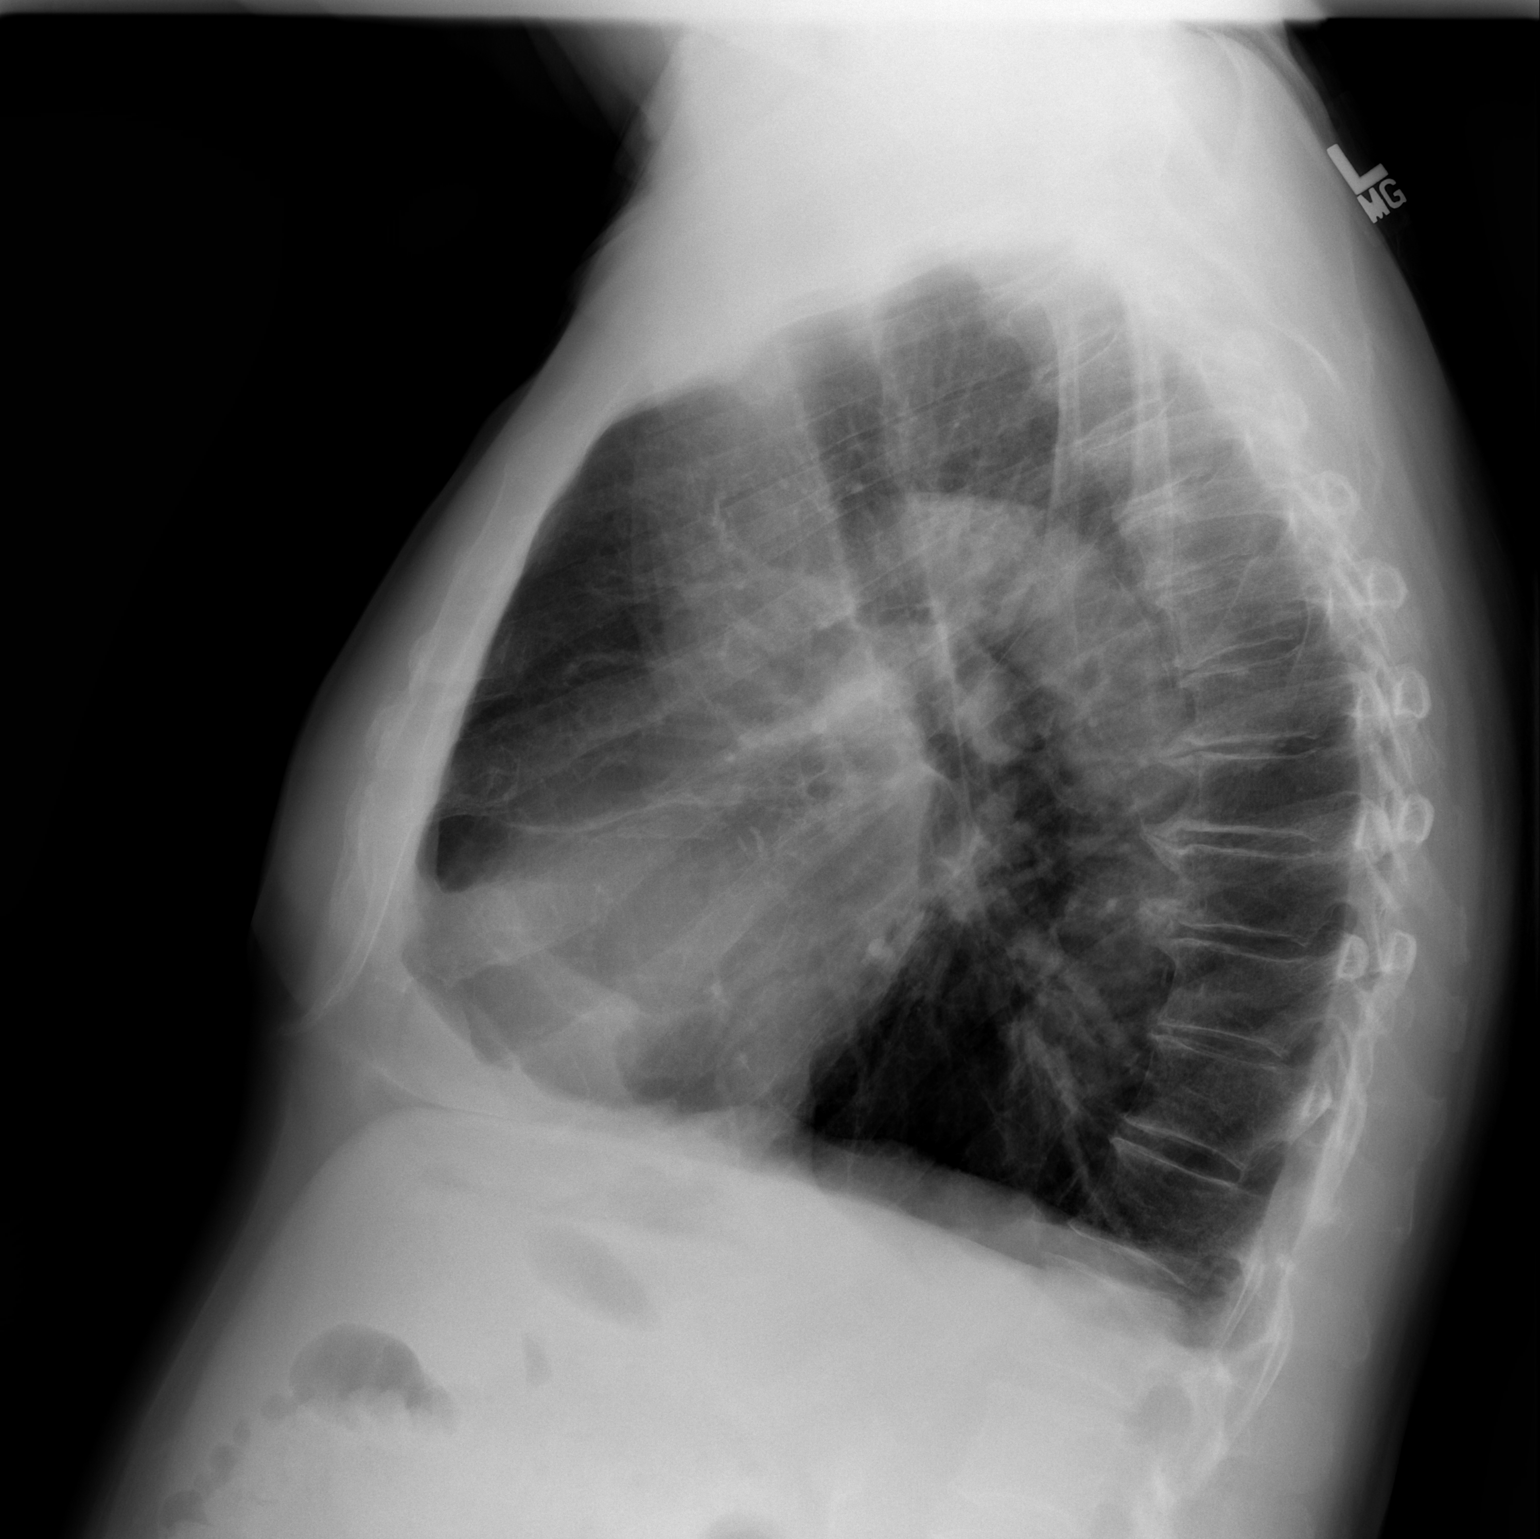

[2 of 2 positions shown; findings below may reference images not displayed]

FINDINGS: Changes of underlying COPD are noted and taking this into
consideration heart size is upper limits of normal.  A normal
mediastinal configuration is identified with aortic calcification
evident.

The lung fields demonstrate linear scarring at both lung bases and
in the lingula and this appears stable in comparison to prior CT.
Prominence of the interstitial markings would correlate with
underlying bronchitic change No focal areas of infiltrate or signs
of congestive failure are seen.

Bony structures demonstrate degenerative osteophytosis of the mid
thoracic spine and are otherwise intact.
IMPRESSION: Stable bibasilar and lingular scarring.  No new focal or acute
cardiopulmonary abnormality is noted

## 2014-01-04 ENCOUNTER — Other Ambulatory Visit: Payer: Self-pay | Admitting: Gastroenterology

## 2014-01-06 ENCOUNTER — Other Ambulatory Visit: Payer: Self-pay | Admitting: Gastroenterology

## 2014-01-06 ENCOUNTER — Telehealth: Payer: Self-pay | Admitting: Gastroenterology

## 2014-01-06 NOTE — Telephone Encounter (Signed)
Spoke with patient and advised for medication refill he will need an office visit. Patient said that he would like an appointment with Dr. Deatra Ina. Patient understand appointment must be kept for further refills.  Dr. Deatra Ina patient made a follow up visit with you for 02-18-2014. Is it ok to refill their Protonix?

## 2014-01-10 ENCOUNTER — Other Ambulatory Visit: Payer: Self-pay

## 2014-01-10 MED ORDER — RIVAROXABAN 15 MG PO TABS: ORAL_TABLET | ORAL | Status: DC

## 2014-01-10 MED ORDER — DIGOXIN 125 MCG PO TABS: ORAL_TABLET | ORAL | Status: DC

## 2014-02-18 ENCOUNTER — Encounter: Payer: Self-pay | Admitting: Emergency Medicine

## 2014-02-18 ENCOUNTER — Other Ambulatory Visit: Payer: Self-pay | Admitting: Emergency Medicine

## 2014-02-18 ENCOUNTER — Ambulatory Visit: Payer: Self-pay

## 2014-02-18 ENCOUNTER — Encounter: Payer: Self-pay | Admitting: Gastroenterology

## 2014-02-18 ENCOUNTER — Ambulatory Visit (INDEPENDENT_AMBULATORY_CARE_PROVIDER_SITE_OTHER): Payer: Medicare FFS | Admitting: Gastroenterology

## 2014-02-18 ENCOUNTER — Ambulatory Visit: Payer: Medicare PPO

## 2014-02-18 ENCOUNTER — Ambulatory Visit (INDEPENDENT_AMBULATORY_CARE_PROVIDER_SITE_OTHER): Payer: Medicare PPO | Admitting: Emergency Medicine

## 2014-02-18 VITALS — BP 130/60 | HR 80 | Ht 63.0 in | Wt 134.4 lb

## 2014-02-18 VITALS — BP 130/58 | HR 62 | Temp 97.5°F | Resp 16 | Ht 63.0 in | Wt 133.2 lb

## 2014-02-18 DIAGNOSIS — J441 Chronic obstructive pulmonary disease with (acute) exacerbation: Secondary | ICD-10-CM

## 2014-02-18 DIAGNOSIS — K745 Biliary cirrhosis, unspecified: Secondary | ICD-10-CM

## 2014-02-18 DIAGNOSIS — Z8719 Personal history of other diseases of the digestive system: Secondary | ICD-10-CM

## 2014-02-18 DIAGNOSIS — D649 Anemia, unspecified: Secondary | ICD-10-CM

## 2014-02-18 DIAGNOSIS — K7460 Unspecified cirrhosis of liver: Secondary | ICD-10-CM

## 2014-02-18 DIAGNOSIS — E119 Type 2 diabetes mellitus without complications: Secondary | ICD-10-CM

## 2014-02-18 DIAGNOSIS — K7689 Other specified diseases of liver: Secondary | ICD-10-CM

## 2014-02-18 DIAGNOSIS — K7581 Nonalcoholic steatohepatitis (NASH): Secondary | ICD-10-CM

## 2014-02-18 DIAGNOSIS — Z1211 Encounter for screening for malignant neoplasm of colon: Secondary | ICD-10-CM

## 2014-02-18 LAB — CBC WITH DIFFERENTIAL/PLATELET
BASOS PCT: 0 % (ref 0–1)
Basophils Absolute: 0 10*3/uL (ref 0.0–0.1)
EOS PCT: 1 % (ref 0–5)
Eosinophils Absolute: 0.1 10*3/uL (ref 0.0–0.7)
HCT: 29.2 % — ABNORMAL LOW (ref 39.0–52.0)
HEMOGLOBIN: 10 g/dL — AB (ref 13.0–17.0)
Lymphocytes Relative: 21 % (ref 12–46)
Lymphs Abs: 1.4 10*3/uL (ref 0.7–4.0)
MCH: 31.3 pg (ref 26.0–34.0)
MCHC: 34.2 g/dL (ref 30.0–36.0)
MCV: 91.5 fL (ref 78.0–100.0)
MONO ABS: 0.5 10*3/uL (ref 0.1–1.0)
MONOS PCT: 7 % (ref 3–12)
NEUTROS ABS: 4.9 10*3/uL (ref 1.7–7.7)
Neutrophils Relative %: 71 % (ref 43–77)
Platelets: 153 10*3/uL (ref 150–400)
RBC: 3.19 MIL/uL — ABNORMAL LOW (ref 4.22–5.81)
RDW: 14.4 % (ref 11.5–15.5)
WBC: 6.9 10*3/uL (ref 4.0–10.5)

## 2014-02-18 LAB — COMPLETE METABOLIC PANEL WITH GFR
ALK PHOS: 68 U/L (ref 39–117)
ALT: 16 U/L (ref 0–53)
AST: 16 U/L (ref 0–37)
Albumin: 3.8 g/dL (ref 3.5–5.2)
BUN: 36 mg/dL — AB (ref 6–23)
CALCIUM: 8.6 mg/dL (ref 8.4–10.5)
CO2: 26 mEq/L (ref 19–32)
CREATININE: 1.09 mg/dL (ref 0.50–1.35)
Chloride: 104 mEq/L (ref 96–112)
GFR, EST NON AFRICAN AMERICAN: 61 mL/min
GFR, Est African American: 71 mL/min
GLUCOSE: 114 mg/dL — AB (ref 70–99)
POTASSIUM: 3.9 meq/L (ref 3.5–5.3)
Sodium: 139 mEq/L (ref 135–145)
Total Bilirubin: 0.4 mg/dL (ref 0.2–1.2)
Total Protein: 6.4 g/dL (ref 6.0–8.3)

## 2014-02-18 LAB — GLUCOSE, POCT (MANUAL RESULT ENTRY): POC Glucose: 131 mg/dl — AB (ref 70–99)

## 2014-02-18 LAB — POCT GLYCOSYLATED HEMOGLOBIN (HGB A1C): HEMOGLOBIN A1C: 5.3

## 2014-02-18 NOTE — Assessment & Plan Note (Signed)
Asymptomatic on PPI therapy

## 2014-02-18 NOTE — Progress Notes (Addendum)
Subjective:  This chart was scribed for Cory Queen, MD by Roxan Diesel, Scribe.  This patient was seen in [place] [facility] and the patient's care was started at 10:43 AM.   Patient ID: Cory Lawrence, male    DOB: [birth year], 80 y.o.   MRN: [id number]  Chief Complaint  Patient presents with  . Diabetes    HPI  HPI Comments: [name] is a 80 y.o. male with h/o COPD and DM who presents to [facility] for a f/u on his diabetes.  Pt states he is doing well overall.  He states his breathing has been good and he has not had much issue with pollen this year.  He has not had a CXR recently.  He reports he is eating well although he is very thin.  He has been trying to gain weight.  He recently started drinking protein shakes.    His sugars have been normal at his recent office visits.  He denies issues with his feet.  He saw his GI Dr. Deatra Ina earlier today and was told all of his GI issues appear stable.  He did not have blood drawn there.  His wife gives him a B12 shot every month.   Patient Active Problem List   Diagnosis Date Noted  . Anemia 02/18/2014  . Weight loss 12/11/2013  . LV dysfunction 03/02/2012  . Atrial Fibrillation 02/22/2012  . Vitamin B12 deficiency 09/29/2011  . COPD with bronchitis 09/29/2011  . History of gastroesophageal reflux (GERD) 09/29/2011  . Diabetes mellitus type 2, diet-controlled   . Other B-complex deficiencies 05/24/2011  . Other general symptoms  05/17/2011  . NASH (nonalcoholic steatohepatitis) 05/17/2011  . Cirrhosis of liver not due to alcohol  05/17/2011  . Personal history of colonic polyps 05/17/2011  . CAD (coronary artery disease) 03/17/2011  . Syncope and collapse   . Dizziness   . Diaphoresis   . Chest pain   . Chest tightness   . Palpitations   . COPD (chronic obstructive pulmonary disease)   . Hypertension   . GERD (gastroesophageal reflux disease)   . BPH (benign prostatic hypertrophy)   . HYPERLIPIDEMIA  11/19/2007  . OBESITY 11/19/2007  . HYPERTENSION 11/19/2007  . INTERNAL HEMORRHOIDS 11/19/2007  . COPD 11/19/2007  . GERD 11/19/2007  . DIVERTICULOSIS, COLON 11/19/2007  . BILIARY CIRRHOSIS, PRIMARY 11/19/2007  . BENIGN PROSTATIC HYPERTROPHY, HX OF 11/19/2007  . COLONIC POLYPS, ADENOMATOUS 07/08/2002    Past Medical History  Diagnosis Date  . Dizziness   . Diaphoresis   . Palpitations   . COPD (chronic obstructive pulmonary disease)   . Hypertension   . Hyperlipidemia   . GERD (gastroesophageal reflux disease)   . BPH (benign prostatic hypertrophy)   . Personal history of colonic polyps 07/08/2002,06/2011    tubular adenoma, hyperplastic  . Internal hemorrhoids without mention of complication   . Diverticulosis of colon (without mention of hemorrhage)   . Biliary cirrhosis   . Unspecified gastritis and gastroduodenitis without mention of hemorrhage   . NASH (nonalcoholic steatohepatitis)   . Anxiety   . Arthritis   . Ulcer     Gastric/bleeding  . Diabetes mellitus type 2, diet-controlled     managed with diet  . Vitamin B12 deficiency   . PAF (paroxysmal atrial fibrillation)     on Xarelto  . CAD (coronary artery disease)     prior abnormal Myoview; subsequent cath; managed medically  . Heart attack     Past Surgical  History  Procedure Laterality Date  . Cataract extraction      bilateral  . Eye surgery      left eye x multiple  . Cardiac catheterization  March 2013    Managed medically    Prior to Admission medications   Medication Sig Start Date End Date Taking? Authorizing Provider  ALPRAZolam (XANAX) 0.5 MG tablet TAKE 1 TABLET BY MOUTH AT BEDTIME AS NEEDED    [name], MD  cyanocobalamin (,VITAMIN B-12,) 1000 MCG/ML injection INJECT 1 ML (1,000 MCG TOTAL) INTO THE MUSCLE EVERY 30 (THIRTY) DAYS. 01/19/13   Sable Feil, MD  digoxin (LANOXIN) 0.125 MG tablet TAKE 1 TABLET BY MOUTH DAILY 01/10/14   Thayer Headings, MD  doxazosin (CARDURA) 4 MG tablet  TAKE 1 TABLET AT BEDTIME 12/12/13   Thayer Headings, MD  Fe-Succ Ac-C-Thre Ac-B12-FA (FERREX 150 FORTE PLUS) 50-100 MG CAPS Take 1 capsule by mouth daily. 11/19/12   Sable Feil, MD  finasteride (PROSCAR) 5 MG tablet  05/21/13   Historical Provider, MD  Fluticasone-Salmeterol (ADVAIR DISKUS) 250-50 MCG/DOSE AEPB Inhale 1 puff into the lungs as needed. Shortness of breath.    Historical Provider, MD  Ipratropium-Albuterol (COMBIVENT IN) Inhale 1 puff into the lungs as needed. Shortness of breath.    Historical Provider, MD  isosorbide mononitrate (IMDUR) 30 MG 24 hr tablet TAKE 1 TABLET (30 MG TOTAL) BY MOUTH DAILY. 12/12/13   Thayer Headings, MD  Melatonin 3 MG CAPS Take 1.5 mg by mouth at bedtime as needed. Sleep     Historical Provider, MD  Multiple Vitamins-Minerals (MULTIVITAMIN PO) Take 1 tablet by mouth daily.    Historical Provider, MD  Omega-3 Fatty Acids (FISH OIL) 1000 MG CAPS Take 1,000 mg by mouth daily.     Historical Provider, MD  pantoprazole (PROTONIX) 40 MG tablet TAKE 1 TABLET BY MOUTH DAILY 11/10/13   Sable Feil, MD  Rivaroxaban (XARELTO) 15 MG TABS tablet TAKE 1 TABLET DAILY 01/10/14   Thayer Headings, MD  simvastatin (ZOCOR) 40 MG tablet TAKE 1 TABLET BY MOUTH EVERY DAY AT BEDTIME    [name], MD  tamsulosin (FLOMAX) 0.4 MG CAPS capsule Take 0.4 mg by mouth.    Historical Provider, MD  triamcinolone ointment (KENALOG) 0.1 % APPLY TO AFFECTED AREA TWICE A DAY 11/19/13   [name], MD  ursodiol (ACTIGALL) 250 MG tablet TAKE 1 TABLET TWICE DAILY    Sable Feil, MD    History   Social History  . Marital Status: Married    Spouse Name: N/A    Number of Children: 2  . Years of Education: N/A   Occupational History  . retired    Social History Main Topics  . Smoking status: Former Research scientist (life sciences)  . Smokeless tobacco: Former Systems developer    Quit date: 05/31/1996  . Alcohol Use: No  . Drug Use: No  . Sexual Activity: No   Other Topics Concern  . Not on file    Social History Narrative  . No narrative on file     Review of Systems  Constitutional: Negative for appetite change.  Respiratory: Negative for shortness of breath and wheezing.          Objective:   Physical Exam CONSTITUTIONAL: Very thin HEAD: Normocephalic/atraumatic EYES: EOMI/PERRL ENMT: Mucous membranes moist NECK: supple no meningeal signs SPINE:entire spine nontender CV: S1/S2 noted, no murmurs/rubs/gallops noted LUNGS: Increased AP diameter with decreased breath sounds in both  bases.  Lungs otherwise clear to auscultation bilaterally, no apparent distress. ABDOMEN: soft, nontender, no rebound or guarding GU:no cva tenderness NEURO: Pt is awake/alert, cooperative, moves all extremitiesx4 EXTREMITIES: Feet have normal sensation, 2+ pulses. SKIN: warm, color normal PSYCH: no abnormalities of mood noted  Results for orders placed in visit on 02/18/14  GLUCOSE, POCT (MANUAL RESULT ENTRY)      Result Value Ref Range   POC Glucose 131 (*) 70 - 99 mg/dl  POCT GLYCOSYLATED HEMOGLOBIN (HGB A1C)      Result Value Ref Range   Hemoglobin A1C 5.3      BP 130/58  Pulse 62  Temp(Src) 97.5 F (36.4 C) (Oral)  Resp 16  Ht 5\' 3"  (1.6 m)  Wt 133 lb 3.2 oz (60.419 kg)  BMI 23.60 kg/m2  SpO2 96%  [facility] reading (PRIMARY) by  Dr.Adonay Scheier there is artifact present on the PA view due to the problem with the cassette. Patient has blunting of both costophrenic phrenic angles. I did not see any significant nodules there is severe underlying COPD       Assessment & Plan:  Patient is doing well. We are having difficulty processing his chest x-ray so this is pending at present. His A1c is 5.3 since he has lost all his weight. I have encouraged him to eat more liberally. He certainly can have increased starches and sugars in his diet. I have encouraged him to try either Ensure or boost or other protein supplement to try and get his weight up. We'll recheck in 3 months I personally  performed the services described in this documentation, which was scribed in my presence. The recorded information has been reviewed and is accurate.

## 2014-02-18 NOTE — Assessment & Plan Note (Signed)
He is stable hepatic function

## 2014-02-18 NOTE — Assessment & Plan Note (Signed)
Etiology is probably multifactorial including anemia of chronic disease, possible iron deficiency anemia from portal hypertensive gastropathy.  Hemoglobin is stable on supplementary iron.  Patient is on xarelto.

## 2014-02-18 NOTE — Assessment & Plan Note (Signed)
Patient is asymptomatic.  Plan to continue Actigall.

## 2014-02-18 NOTE — Progress Notes (Signed)
_                                                                                                                History of Present Illness: This is a very complicated 80 year old patient with multiple cardiovascular issues coronary artery disease and chronic congestive heart failure.  He has been seen over the past 11 years but by Dr. Verl Blalock for primary biliary cirrhosis and steatohepatitis with liver biopsy 11 years ago showed evidence of cirrhosis. He is been on Actigall and 50 mg twice a day for years. Review of recent labs shows normal liver function tests, and ultrasound exam one year ago was unremarkable without evidence of ascites or portal hypertension. In the past, endoscopy showed evidence of mild portal gastropathy, and he is on chronic iron therapy, and recent labs have shown a stable hemoglobin of 11.1 with normal iron saturations and normal ferritin level.  He denies any GI complaints at this time and is on Protonix 40 mg a day for GERD. There is no history of abdominal pain, nausea vomiting, memory disorder, fluid retention, hematemesis, melena, he is up-to-date on his endoscopy and colonoscopy exams. He and his wife deny alcohol use. He is followed closely by cardiology by Dr.Nasher and is on digoxin, Cardura, and Imdur.  He has no particular complaints including cardiac or GI problems.  Stools are dark but he is taking iron.    Past Medical History  Diagnosis Date  . Dizziness   . Diaphoresis   . Palpitations   . COPD (chronic obstructive pulmonary disease)   . Hypertension   . Hyperlipidemia   . GERD (gastroesophageal reflux disease)   . BPH (benign prostatic hypertrophy)   . Personal history of colonic polyps 07/08/2002,06/2011    tubular adenoma, hyperplastic  . Internal hemorrhoids without mention of complication   . Diverticulosis of colon (without mention of hemorrhage)   . Biliary cirrhosis   . Unspecified gastritis and gastroduodenitis without  mention of hemorrhage   . NASH (nonalcoholic steatohepatitis)   . Anxiety   . Arthritis   . Ulcer     Gastric/bleeding  . Diabetes mellitus type 2, diet-controlled     managed with diet  . Vitamin B12 deficiency   . PAF (paroxysmal atrial fibrillation)     on Xarelto  . CAD (coronary artery disease)     prior abnormal Myoview; subsequent cath; managed medically  . Heart attack    Past Surgical History  Procedure Laterality Date  . Cataract extraction      bilateral  . Eye surgery      left eye x multiple  . Cardiac catheterization  March 2013    Managed medically   family history includes Cancer in his mother; Diabetes in his mother and sister; Heart failure in his mother and sister. There is no history of Colon cancer. Current Outpatient Prescriptions  Medication Sig Dispense Refill  . ALPRAZolam (XANAX) 0.5 MG tablet TAKE 1 TABLET BY MOUTH AT BEDTIME AS NEEDED  30 tablet  4  . cyanocobalamin (,VITAMIN B-12,) 1000 MCG/ML injection INJECT 1 ML (1,000 MCG TOTAL) INTO THE MUSCLE EVERY 30 (THIRTY) DAYS.  10 mL  0  . digoxin (LANOXIN) 0.125 MG tablet TAKE 1 TABLET BY MOUTH DAILY  90 tablet  3  . doxazosin (CARDURA) 4 MG tablet TAKE 1 TABLET AT BEDTIME  30 tablet  11  . Fe-Succ Ac-C-Thre Ac-B12-FA (FERREX 150 FORTE PLUS) 50-100 MG CAPS Take 1 capsule by mouth daily.  90 each  3  . finasteride (PROSCAR) 5 MG tablet       . Fluticasone-Salmeterol (ADVAIR DISKUS) 250-50 MCG/DOSE AEPB Inhale 1 puff into the lungs as needed. Shortness of breath.      . Ipratropium-Albuterol (COMBIVENT IN) Inhale 1 puff into the lungs as needed. Shortness of breath.      . isosorbide mononitrate (IMDUR) 30 MG 24 hr tablet TAKE 1 TABLET (30 MG TOTAL) BY MOUTH DAILY.  30 tablet  11  . Melatonin 3 MG CAPS Take 1.5 mg by mouth at bedtime as needed. Sleep       . Multiple Vitamins-Minerals (MULTIVITAMIN PO) Take 1 tablet by mouth daily.      . Omega-3 Fatty Acids (FISH OIL) 1000 MG CAPS Take 1,000 mg by mouth  daily.       . pantoprazole (PROTONIX) 40 MG tablet TAKE 1 TABLET BY MOUTH DAILY  34 tablet  0  . Rivaroxaban (XARELTO) 15 MG TABS tablet TAKE 1 TABLET DAILY  90 tablet  3  . simvastatin (ZOCOR) 40 MG tablet TAKE 1 TABLET BY MOUTH EVERY DAY AT BEDTIME  90 tablet  1  . tamsulosin (FLOMAX) 0.4 MG CAPS capsule Take 0.4 mg by mouth.      . triamcinolone ointment (KENALOG) 0.1 % APPLY TO AFFECTED AREA TWICE A DAY  30 g  5  . ursodiol (ACTIGALL) 250 MG tablet TAKE 1 TABLET TWICE DAILY  180 tablet  1   No current facility-administered medications for this visit.   Allergies as of 02/18/2014 - Review Complete 02/18/2014  Allergen Reaction Noted  . Penicillins Hives     reports that he has quit smoking. He quit smokeless tobacco use about 17 years ago. He reports that he does not drink alcohol or use illicit drugs.     Review of Systems: Pertinent positive and negative review of systems were noted in the above HPI section. All other review of systems were otherwise negative.  Vital signs were reviewed in today's medical record Physical Exam: General: Well developed , well nourished, no acute distress Skin: anicteric Head: Normocephalic and atraumatic Eyes:  sclerae anicteric, EOMI Ears: Normal auditory acuity Mouth: No deformity or lesions Neck: Supple, no masses or thyromegaly Lungs: Clear throughout to auscultation Heart: Regular rate and rhythm; no murmurs, rubs or bruits Abdomen: Soft, non tender and non distended. No masses, hepatosplenomegaly or hernias noted. Normal Bowel sounds Rectal:deferred Musculoskeletal: Symmetrical with no gross deformities  Skin: No lesions on visible extremities Pulses:  Normal pulses noted Extremities: No clubbing, cyanosis, edema or deformities noted Neurological: Alert oriented x 4, grossly nonfocal Cervical Nodes:  No significant cervical adenopathy Inguinal Nodes: No significant inguinal adenopathy Psychological:  Alert and cooperative. Normal  mood and affect  See Assessment and Plan under Problem List

## 2014-02-18 NOTE — Patient Instructions (Signed)
Follow up as needed

## 2014-02-19 LAB — FERRITIN: Ferritin: 40 ng/mL (ref 22–322)

## 2014-02-19 LAB — IRON AND TIBC
%SAT: 21 % (ref 20–55)
IRON: 72 ug/dL (ref 42–165)
TIBC: 343 ug/dL (ref 215–435)
UIBC: 271 ug/dL (ref 125–400)

## 2014-02-19 LAB — RETICULOCYTES
ABS Retic: 51.2 10*3/uL (ref 19.0–186.0)
RBC.: 3.2 MIL/uL — ABNORMAL LOW (ref 4.22–5.81)
Retic Ct Pct: 1.6 % (ref 0.4–2.3)

## 2014-02-25 ENCOUNTER — Telehealth: Payer: Self-pay | Admitting: Internal Medicine

## 2014-02-25 ENCOUNTER — Ambulatory Visit: Payer: Medicare PPO

## 2014-02-25 ENCOUNTER — Other Ambulatory Visit: Payer: Self-pay | Admitting: Gastroenterology

## 2014-02-25 ENCOUNTER — Encounter: Payer: Self-pay | Admitting: Emergency Medicine

## 2014-02-25 ENCOUNTER — Ambulatory Visit (INDEPENDENT_AMBULATORY_CARE_PROVIDER_SITE_OTHER): Payer: Medicare PPO | Admitting: Emergency Medicine

## 2014-02-25 VITALS — BP 129/66 | HR 70 | Temp 97.4°F | Resp 16 | Ht 63.5 in | Wt 133.0 lb

## 2014-02-25 DIAGNOSIS — J441 Chronic obstructive pulmonary disease with (acute) exacerbation: Secondary | ICD-10-CM

## 2014-02-25 DIAGNOSIS — R634 Abnormal weight loss: Secondary | ICD-10-CM

## 2014-02-25 DIAGNOSIS — E538 Deficiency of other specified B group vitamins: Secondary | ICD-10-CM

## 2014-02-25 DIAGNOSIS — D649 Anemia, unspecified: Secondary | ICD-10-CM

## 2014-02-25 LAB — CBC WITH DIFFERENTIAL/PLATELET
Basophils Absolute: 0 10*3/uL (ref 0.0–0.1)
Basophils Relative: 0 % (ref 0–1)
Eosinophils Absolute: 0.1 10*3/uL (ref 0.0–0.7)
Eosinophils Relative: 1 % (ref 0–5)
HEMATOCRIT: 30 % — AB (ref 39.0–52.0)
Hemoglobin: 10.4 g/dL — ABNORMAL LOW (ref 13.0–17.0)
LYMPHS PCT: 24 % (ref 12–46)
Lymphs Abs: 1.3 10*3/uL (ref 0.7–4.0)
MCH: 32.2 pg (ref 26.0–34.0)
MCHC: 34.7 g/dL (ref 30.0–36.0)
MCV: 92.9 fL (ref 78.0–100.0)
MONO ABS: 0.4 10*3/uL (ref 0.1–1.0)
Monocytes Relative: 7 % (ref 3–12)
NEUTROS ABS: 3.8 10*3/uL (ref 1.7–7.7)
Neutrophils Relative %: 68 % (ref 43–77)
PLATELETS: 145 10*3/uL — AB (ref 150–400)
RBC: 3.23 MIL/uL — ABNORMAL LOW (ref 4.22–5.81)
RDW: 14.7 % (ref 11.5–15.5)
WBC: 5.6 10*3/uL (ref 4.0–10.5)

## 2014-02-25 NOTE — Telephone Encounter (Signed)
left message for patient to return call to schedule np appt.  °

## 2014-02-25 NOTE — Progress Notes (Signed)
This chart was scribed for [name], MD by Elby Beck, ED Scribe. This patient was seen in room 22 and the patient's care was started at 12:13 PM.  Subjective:    Patient ID: Cory Lawrence, male    DOB: 1925-06-20, 80 y.o.   MRN: [id number]  Chief Complaint  Patient presents with  . Follow-up    CXR   HPI  HPI Comments: Cory Lawrence is a 80 y.o. male who presents to the [facility] for a follow-up evaluation after having a CXR last week. He had a low hemoglobin of 10.0 taken 1 week ago. His recent lab work has indicated that his blood sugar and iron levels are within normal ranges. He notes that he has appointments with a Dealer and Cardiologist next month. He states that he has been seen by a GI doctor in the past month, and had an overall normal visit. He notes that he gets shots of Vitamin B12 monthly. He reports that he walks about a mile each day. He notes that he occasionally has fatigue, but mentions that this is not severe or constant. He states that he is also taking Vitamins.   Review of Systems  Constitutional: Positive for fatigue (occasional).       Objective:   Physical Exam  CONSTITUTIONAL: Well developed/well nourished HEAD: Normocephalic/atraumatic EYES: EOMI/PERRL ENMT: Mucous membranes moist NECK: supple no meningeal signs SPINE:entire spine nontender CV: S1/S2 noted, no murmurs/rubs/gallops noted LUNGS: There is an increased AP diameter with decreased breath sounds in the bases ABDOMEN: soft, nontender, no rebound or guarding GU:no cva tenderness NEURO: Pt is awake/alert, moves all extremitiesx4 EXTREMITIES: pulses normal, full ROM SKIN: warm, color normal PSYCH: no abnormalities of mood noted [facility] reading (PRIMARY) by  Dr. Everlene Farrier severe COPD with bleb formation left lower lobe  Results for orders placed in visit on 02/18/14  CBC WITH DIFFERENTIAL      Result Value Ref Range   WBC 6.9  4.0 - 10.5 K/uL   RBC  3.19 (*) 4.22 - 5.81 MIL/uL   Hemoglobin 10.0 (*) 13.0 - 17.0 g/dL   HCT 29.2 (*) 39.0 - 52.0 %   MCV 91.5  78.0 - 100.0 fL   MCH 31.3  26.0 - 34.0 pg   MCHC 34.2  30.0 - 36.0 g/dL   RDW 14.4  11.5 - 15.5 %   Platelets 153  150 - 400 K/uL   Neutrophils Relative % 71  43 - 77 %   Neutro Abs 4.9  1.7 - 7.7 K/uL   Lymphocytes Relative 21  12 - 46 %   Lymphs Abs 1.4  0.7 - 4.0 K/uL   Monocytes Relative 7  3 - 12 %   Monocytes Absolute 0.5  0.1 - 1.0 K/uL   Eosinophils Relative 1  0 - 5 %   Eosinophils Absolute 0.1  0.0 - 0.7 K/uL   Basophils Relative 0  0 - 1 %   Basophils Absolute 0.0  0.0 - 0.1 K/uL   Smear Review Criteria for review not met    COMPLETE METABOLIC PANEL WITH GFR      Result Value Ref Range   Sodium 139  135 - 145 mEq/L   Potassium 3.9  3.5 - 5.3 mEq/L   Chloride 104  96 - 112 mEq/L   CO2 26  19 - 32 mEq/L   Glucose, Bld 114 (*) 70 - 99 mg/dL   BUN 36 (*) 6 -  23 mg/dL   Creat 1.09  0.50 - 1.35 mg/dL   Total Bilirubin 0.4  0.2 - 1.2 mg/dL   Alkaline Phosphatase 68  39 - 117 U/L   AST 16  0 - 37 U/L   ALT 16  0 - 53 U/L   Total Protein 6.4  6.0 - 8.3 g/dL   Albumin 3.8  3.5 - 5.2 g/dL   Calcium 8.6  8.4 - 10.5 mg/dL   GFR, Est African American 71     GFR, Est Non African American 61    GLUCOSE, POCT (MANUAL RESULT ENTRY)      Result Value Ref Range   POC Glucose 131 (*) 70 - 99 mg/dl  POCT GLYCOSYLATED HEMOGLOBIN (HGB A1C)      Result Value Ref Range   Hemoglobin A1C 5.3        BP 129/66  Pulse 70  Temp(Src) 97.4 F (36.3 C)  Resp 16  Ht 5' 3.5" (1.613 m)  Wt 133 lb (60.328 kg)  BMI 23.19 kg/m2  SpO2 96% Assessment & Plan:  I suspect this anemia is multifactorial. I did repeat his protein electrophoresis and immunoelectrophoresis. His iron studies did not disclose a definite iron deficiency anemia. His BUN was elevated raising the issue blood in the GI tract. I think the next step is the evaluate by hematology for their opinion  I personally  performed the services described in this documentation, which was scribed in my presence. The recorded information has been reviewed and is accurate.

## 2014-02-25 NOTE — Telephone Encounter (Signed)
S/W PATIENT WIFE AND GAVE NP APPT FOR 05/08 @ 1:30 W/DR. CHISM REFERRING DR. Marion PACKET MAILED.

## 2014-02-25 NOTE — Telephone Encounter (Signed)
C/D 02/25/14 for appt, 03/07/14

## 2014-02-26 LAB — FOLATE

## 2014-02-26 LAB — VITAMIN B12: Vitamin B-12: 479 pg/mL (ref 211–911)

## 2014-02-26 LAB — PATHOLOGIST SMEAR REVIEW

## 2014-02-27 ENCOUNTER — Telehealth: Payer: Self-pay | Admitting: Gastroenterology

## 2014-02-27 LAB — PROTEIN ELECTROPHORESIS, SERUM
ALBUMIN ELP: 54.7 % — AB (ref 55.8–66.1)
Alpha-1-Globulin: 3.9 % (ref 2.9–4.9)
Alpha-2-Globulin: 11.7 % (ref 7.1–11.8)
Beta 2: 7 % — ABNORMAL HIGH (ref 3.2–6.5)
Beta Globulin: 8 % — ABNORMAL HIGH (ref 4.7–7.2)
Gamma Globulin: 14.7 % (ref 11.1–18.8)
TOTAL PROTEIN, SERUM ELECTROPHOR: 6.2 g/dL (ref 6.0–8.3)

## 2014-02-27 LAB — IMMUNOFIXATION ELECTROPHORESIS
IGA: 439 mg/dL — AB (ref 68–379)
IgG (Immunoglobin G), Serum: 924 mg/dL (ref 650–1600)
IgM, Serum: 131 mg/dL (ref 41–251)
Total Protein, Serum Electrophoresis: 6.2 g/dL (ref 6.0–8.3)

## 2014-02-27 MED ORDER — PANTOPRAZOLE SODIUM 40 MG PO TBEC: 40.0000 mg | DELAYED_RELEASE_TABLET | Freq: Every day | ORAL | Status: DC

## 2014-02-27 NOTE — Telephone Encounter (Signed)
Med sent to pharmacy pt aware 

## 2014-03-07 ENCOUNTER — Encounter: Payer: Self-pay | Admitting: Internal Medicine

## 2014-03-07 ENCOUNTER — Ambulatory Visit (HOSPITAL_BASED_OUTPATIENT_CLINIC_OR_DEPARTMENT_OTHER): Payer: Medicare PPO

## 2014-03-07 ENCOUNTER — Other Ambulatory Visit: Payer: Medicare PPO

## 2014-03-07 ENCOUNTER — Ambulatory Visit (HOSPITAL_BASED_OUTPATIENT_CLINIC_OR_DEPARTMENT_OTHER): Payer: Medicare FFS | Admitting: Internal Medicine

## 2014-03-07 ENCOUNTER — Telehealth: Payer: Self-pay | Admitting: Internal Medicine

## 2014-03-07 VITALS — BP 123/48 | HR 52 | Temp 98.0°F | Resp 18 | Ht 63.0 in | Wt 133.4 lb

## 2014-03-07 DIAGNOSIS — K745 Biliary cirrhosis, unspecified: Secondary | ICD-10-CM

## 2014-03-07 DIAGNOSIS — D649 Anemia, unspecified: Secondary | ICD-10-CM

## 2014-03-07 DIAGNOSIS — I4891 Unspecified atrial fibrillation: Secondary | ICD-10-CM

## 2014-03-07 DIAGNOSIS — I509 Heart failure, unspecified: Secondary | ICD-10-CM

## 2014-03-07 DIAGNOSIS — D638 Anemia in other chronic diseases classified elsewhere: Secondary | ICD-10-CM

## 2014-03-07 DIAGNOSIS — K7689 Other specified diseases of liver: Secondary | ICD-10-CM

## 2014-03-07 NOTE — Progress Notes (Signed)
Checked in new patient with no financial issues and he has not seen the dr. He has not been out of the country.

## 2014-03-07 NOTE — Patient Instructions (Signed)
Ferritin This is done to test for anemia. Anemia occurs when the amount of hemoglobin (found in the red blood cells) drops below normal. Hemoglobin is necessary for the transportation of oxygen throughout the body. Blood tests may show a variety of common, treatable abnormalities that can lead to problems associated with anemia. Iron deficiency anemia is the most common of the anemias. It is usually due to bleeding. In women, iron deficiency may be due to heavy menstrual periods. In older women and in men, the bleeding is usually from disease of the intestines. In children and in pregnant women, the body needs more iron. Iron deficiency may be due to simply not eating enough iron in the diet. Iron deficiency may also result from some extreme diets. Treatment of iron deficiency usually involves iron supplements. In older women and in men, there is usually some further testing needed to determine why the person is iron deficient.  PREPARATION FOR TEST A blood sample is obtained by inserting a needle into a vein in the arm. NORMAL FINDINGS Male: 12-300ng/ml or 12-300  g/L (SI units) Male:  10-150 ng/ml or 10-150  g/L (SI units) Children/adolescent:  Newborn: 25-200 ng/ml  1 month: 200-600 ng/ml  2-5 months: 50-200 ng/ml  6 months-15 years: 7-142 ng/ml Ranges for normal findings may vary among different laboratories and hospitals. You should always check with your doctor after having lab work or other tests done to discuss the meaning of your test results and whether your values are considered within normal limits. MEANING OF TEST  Your caregiver will go over the test results with you and discuss the importance and meaning of your results, as well as treatment options and the need for additional tests if necessary. OBTAINING THE TEST RESULTS  It is your responsibility to obtain your test results. Ask the lab or department performing the test when and how you will get your results. Document  Released: 11/09/2004 Document Revised: 01/09/2012 Document Reviewed: 09/26/2008 [facility] Patient Information 2014 [place], [place]. Anemia, Nonspecific Anemia is a condition in which the concentration of red blood cells or hemoglobin in the blood is below normal. Hemoglobin is a substance in red blood cells that carries oxygen to the tissues of the body. Anemia results in not enough oxygen reaching these tissues.  CAUSES  Common causes of anemia include:   Excessive bleeding. Bleeding may be internal or external. This includes excessive bleeding from periods (in women) or from the intestine.   Poor nutrition.   Chronic kidney, thyroid, and liver disease.  Bone marrow disorders that decrease red blood cell production.  Cancer and treatments for cancer.  HIV, AIDS, and their treatments.  Spleen problems that increase red blood cell destruction.  Blood disorders.  Excess destruction of red blood cells due to infection, medicines, and autoimmune disorders. SIGNS AND SYMPTOMS   Minor weakness.   Dizziness.   Headache.  Palpitations.   Shortness of breath, especially with exercise.   Paleness.  Cold sensitivity.  Indigestion.  Nausea.  Difficulty sleeping.  Difficulty concentrating. Symptoms may occur suddenly or they may develop slowly.  DIAGNOSIS  Additional blood tests are often needed. These help your health care provider determine the best treatment. Your health care provider will check your stool for blood and look for other causes of blood loss.  TREATMENT  Treatment varies depending on the cause of the anemia. Treatment can include:   Supplements of iron, vitamin O16, or folic acid.   Hormone medicines.   A blood transfusion.  This may be needed if blood loss is severe.   Hospitalization. This may be needed if there is significant continual blood loss.   Dietary changes.  Spleen removal. HOME CARE INSTRUCTIONS Keep all follow-up  appointments. It often takes many weeks to correct anemia, and having your health care provider check on your condition and your response to treatment is very important. SEEK IMMEDIATE MEDICAL CARE IF:   You develop extreme weakness, shortness of breath, or chest pain.   You become dizzy or have trouble concentrating.  You develop heavy vaginal bleeding.   You develop a rash.   You have bloody or black, tarry stools.   You faint.   You vomit up blood.   You vomit repeatedly.   You have abdominal pain.  You have a fever or persistent symptoms for more than 2 3 days.   You have a fever and your symptoms suddenly get worse.   You are dehydrated.  MAKE SURE YOU:  Understand these instructions.  Will watch your condition.  Will get help right away if you are not doing well or get worse. Document Released: 11/24/2004 Document Revised: 06/19/2013 Document Reviewed: 04/12/2013 [facility] Patient Information 2014 [place].

## 2014-03-07 NOTE — Telephone Encounter (Signed)
gv adn printed appt sched and avs for pt for May and June °

## 2014-03-07 NOTE — Progress Notes (Signed)
[place] Telephone:([phone]) [phone]   [phone]) [phone]  NEW PATIENT EVALUATION   Name: Cory Lawrence Date: 03/07/2014 MRN: [id number] DOB: Jan 16, 1925  PCP: [name], MD   REFERRING PHYSICIAN: [name], MD  REASON FOR REFERRAL:  Anemia NOS    HISTORY OF PRESENT ILLNESS:Cory Lawrence is a 80 y.o. male with history of CAD/CHF, paroxysmal atrial fibrillation on xalreto, DM2, primary biliary cirrhosis and NASH who is being referred to our office for further evaluation of his anemia.    Today, he is accompanied by his wife Cory Lawrence.  He saw Dr. Everlene Farrier on 02/25/2014 and reportedly had a CXR the week prior.  In addition, he was found to have a low hemoglobin of 10 with iron levels within normal range.  He saw Dr. Jasmine December, a urologist, about four months and was prescribed finasteride for BPH with improvement in his symptoms of nocturia.  Regarding his anemia work-up, he has been followed by Dr. Verl Blalock (now Dr. Deatra Ina) of GI with his last colonoscopy three years ago and at which time he had removal of multiple polyps and severe diverticulosis.  Dr. Deatra Ina also follows him up for NASH complicated by cirrhosis (liver biopsy 11 years ago) and  primary biliary cirrhosis with his last visit on 004/21/2015.  He is been on Actigall and 50 mg twice a day for years. Abdominal ultrasound exam one year ago was unremarkable without evidence of ascites or portal hypertension. In the past, endoscopy showed evidence of mild portal gastropathy. Due to his anemia, he has been receiving the vitamin B12 shots over the past 2 years.  He reports walking one mile a day.  He has not done these walks over the past few weeks due to knee problems.  His CBC on 02/18/2014 revealed a WBC of 6.9; Hemoglobin 10; Hematocrit 29.2; Platelets 153.  His CMP revealed a creatinine of 1.09; normal liver vfunction tests. He also has COPD (prior smoker, quit 20 years ago, 40 pack years).  He takes  iron supplementation daily for the past one year.  He denies hematochezia but reports dark stools.    PAST MEDICAL HISTORY:  has a past medical history of Dizziness; Diaphoresis; Palpitations; COPD (chronic obstructive pulmonary disease); Hypertension; Hyperlipidemia; GERD (gastroesophageal reflux disease); BPH (benign prostatic hypertrophy); Personal history of colonic polyps (07/08/2002,06/2011); Internal hemorrhoids without mention of complication; Diverticulosis of colon (without mention of hemorrhage); Biliary cirrhosis; Unspecified gastritis and gastroduodenitis without mention of hemorrhage; NASH (nonalcoholic steatohepatitis); Anxiety; Arthritis; Ulcer; Diabetes mellitus type 2, diet-controlled; Vitamin B12 deficiency; PAF (paroxysmal atrial fibrillation); CAD (coronary artery disease); and Heart attack.     PAST SURGICAL HISTORY: Past Surgical History  Procedure Laterality Date  . Cataract extraction      bilateral  . Eye surgery      left eye x multiple  . Cardiac catheterization  March 2013    Managed medically   CURRENT MEDICATIONS: has a current medication list which includes the following prescription(s): alprazolam, cyanocobalamin, digoxin, doxazosin, ferrex 150 forte plus, finasteride, fluticasone-salmeterol, ipratropium-albuterol, isosorbide mononitrate, melatonin, multiple vitamins-minerals, fish oil, pantoprazole, rivaroxaban, simvastatin, tamsulosin, triamcinolone ointment, and ursodiol.   ALLERGIES: Penicillins   SOCIAL HISTORY:  reports that he has quit smoking. He quit smokeless tobacco use about 17 years ago. He reports that he does not drink alcohol or use illicit drugs.   FAMILY HISTORY: family history includes Cancer in his mother; Diabetes in his mother and sister; Heart failure in his mother and sister.  There is no history of Colon cancer.   LABORATORY DATA:  CBC    Component Value Date/Time   WBC 5.6 02/25/2014 1236   RBC 3.23* 02/25/2014 1236   RBC 3.20*  02/18/2014 1056   HGB 10.4* 02/25/2014 1236   HCT 30.0* 02/25/2014 1236   PLT 145* 02/25/2014 1236   MCV 92.9 02/25/2014 1236   MCH 32.2 02/25/2014 1236   MCHC 34.7 02/25/2014 1236   RDW 14.7 02/25/2014 1236   LYMPHSABS 1.3 02/25/2014 1236   MONOABS 0.4 02/25/2014 1236   EOSABS 0.1 02/25/2014 1236   BASOSABS 0.0 02/25/2014 1236   CMP     Component Value Date/Time   NA 139 02/18/2014 1056   K 3.9 02/18/2014 1056   CL 104 02/18/2014 1056   CO2 26 02/18/2014 1056   GLUCOSE 114* 02/18/2014 1056   GLUCOSE 120* 10/05/2006 0919   BUN 36* 02/18/2014 1056   CREATININE 1.09 02/18/2014 1056   CREATININE 1.2 03/02/2012 1505   CALCIUM 8.6 02/18/2014 1056   PROT 6.4 02/18/2014 1056   ALBUMIN 3.8 02/18/2014 1056   AST 16 02/18/2014 1056   ALT 16 02/18/2014 1056   ALKPHOS 68 02/18/2014 1056   BILITOT 0.4 02/18/2014 1056   GFRNONAA 61 02/18/2014 1056   GFRNONAA 55* 02/05/2012 0455   GFRAA 71 02/18/2014 1056   GFRAA 63* 02/05/2012 0455    Results for KAJ, VASIL (MRN [id number]) as of 03/07/2014 14:47  Ref. Range 02/18/2014 10:56  Iron Latest Range: 42-165 ug/dL 72  UIBC Latest Range: 125-400 ug/dL 271  TIBC Latest Range: 215-435 ug/dL 343  %SAT Latest Range: 20-55 % 21  Ferritin Latest Range: 22-322 ng/mL 40    Results for ADRION, MENZ (MRN [id number]) as of 03/07/2014 14:47  Ref. Range 02/25/2014 12:36  Folate No range found >20.0   Results for JEREMIAH, CURCI (MRN [id number]) as of 03/07/2014 14:47  Ref. Range 05/20/2011 11:36  FECAL OCCULT BLOOD, IMMUNOCHEMICAL No range found Rpt   Results for JAHKI, WITHAM (MRN [id number]) as of 03/07/2014 14:47  Ref. Range 02/25/2014 12:36  Folate No range found >20.0   Results for VERDELL, DYKMAN (MRN [id number]) as of 03/07/2014 14:47  Ref. Range 02/25/2014 12:36  IgG (Immunoglobin G), Serum Latest Range: [phone] mg/dL 924  IgA Latest Range: 68-379 mg/dL 439 (H)  IgM, Serum Latest Range: 41-251 mg/dL 131  Total Protein, Serum Electrophoresis  Latest Range: 6.0-8.3 g/dL 6.2   RADIOGRAPHY: Dg Chest 2 View  02/25/2014   CLINICAL DATA:  COPD exacerbation  EXAM: CHEST  2 VIEW  COMPARISON:  02/04/2012  FINDINGS: Chronic interstitial markings/emphysematous changes with bullous changes in the bilateral lower lobes. No focal consolidation. No pleural effusion or pneumothorax.  The heart is normal size.  Degenerative changes of the visualized thoracolumbar spine.  IMPRESSION: Chronic interstitial markings/emphysematous changes.  No evidence of acute cardiopulmonary disease.   Electronically Signed   By: Julian Hy M.D.   On: 02/25/2014 15:12   Dg Chest 2 View  02/18/2014   CLINICAL DATA:  Cough.  EXAM: CHEST  2 VIEW  COMPARISON:  None.  FINDINGS: Nondiagnostic evaluation of the mid and upper left hemi thorax secondary to the significant artifact on the anterior view.  Visualized cardiac and mediastinal contours are grossly unremarkable. The lungs appear hyperinflated. No definite pleural effusion. Mid thoracic spine degenerative change.  IMPRESSION: Nondiagnostic anterior view secondary to significant artifact. Recommend repeat evaluation.  Within the above limitations, there is suggestion of scarring within  the bilateral lung bases.  The lungs appear hyperinflated.   Electronically Signed   By: Lovey Newcomer M.D.   On: 02/18/2014 13:15       REVIEW OF SYSTEMS:  Constitutional: Denies fevers, chills; weight loss of nearly 50 lbs over the past 2 years.  Eyes: Denies blurriness of vision Ears, nose, mouth, throat, and face: Denies mucositis or sore throat Respiratory: Reports a cough in the morning, dyspnea or wheezes Cardiovascular: Denies palpitation, chest discomfort or lower extremity swelling Gastrointestinal:  Denies nausea, heartburn or change in bowel habits Skin: Denies abnormal skin rashes Lymphatics: Denies new lymphadenopathy or easy bruising Neurological:Denies numbness, tingling or new weaknesses Behavioral/Psych: Mood is  stable, no new changes  All other systems were reviewed with the patient and are negative.  PHYSICAL EXAM:  height is 5\' 3"  (1.6 m) and weight is 133 lb 6.4 oz (60.51 kg). His oral temperature is 98 F (36.7 C). His blood pressure is 123/48 and his pulse is 52. His respiration is 18 and oxygen saturation is 95%.    GENERAL:alert, no distress and comfortable; elderly male easily mobile to the exam.  SKIN: skin color, texture, turgor are normal, no rashes or significant lesions EYES: normal, Conjunctiva are pink and non-injected, sclera clear OROPHARYNX:no exudate, no erythema and lips, buccal mucosa, and tongue normal  NECK: supple, thyroid normal size, non-tender, without nodularity LYMPH:  no palpable lymphadenopathy in the cervical, axillary or inguinal LUNGS: clear to auscultation and percussion with normal breathing effort HEART: irregular irregular and no murmurs and no lower extremity edema ABDOMEN:abdomen soft, non-tender and normal bowel sounds Musculoskeletal:no cyanosis of digits and no clubbing  NEURO: alert & oriented x 3 with fluent speech, no focal motor/sensory deficits   IMPRESSION: Cory Lawrence is a 80 y.o. male with a history of    PLAN:  1.  Anemia NOS. -- His hemoglobin on 02/25/2014 was 10.4.  With an MCV of 92.9. He has a normocytic anemia of chronic disease.  He denies symptoms of anemia including chest pain and dyspnea on exertion.  He is being followed closely with GI Dr. Deatra Ina.  His EGD showed mild gastropathy. Colonoscopy in August does demonstrate severe diverticulosis.  Patient is on both vitamin B12 and iron.  We will continue current management. He will have repeat labs in one month and then in 2 months with a follow up. His creatinine clearance is 39.2 mL/min; he could also have some mild anemia of chronic kidney disease.   2. NASH complicated by cirrhosis, PBC. --Followed by GI (Dr. Deatra Ina).   3. Atrial Fib/CHF/CAD. --On Xarelto.  All  questions were answered. The patient knows to call the clinic with any problems, questions or concerns. We can certainly see the patient much sooner if necessary.  I spent 25 minutes counseling the patient face to face. The total time spent in the appointment was 45 minutes.    Concha Norway, MD 03/07/2014 2:37 PM

## 2014-03-10 ENCOUNTER — Other Ambulatory Visit: Payer: Self-pay | Admitting: Gastroenterology

## 2014-03-21 ENCOUNTER — Other Ambulatory Visit (HOSPITAL_BASED_OUTPATIENT_CLINIC_OR_DEPARTMENT_OTHER): Payer: Medicare FFS

## 2014-03-21 DIAGNOSIS — D638 Anemia in other chronic diseases classified elsewhere: Secondary | ICD-10-CM

## 2014-03-21 DIAGNOSIS — D649 Anemia, unspecified: Secondary | ICD-10-CM

## 2014-03-21 LAB — CBC WITH DIFFERENTIAL/PLATELET
BASO%: 0.1 % (ref 0.0–2.0)
Basophils Absolute: 0 10*3/uL (ref 0.0–0.1)
EOS%: 1.1 % (ref 0.0–7.0)
Eosinophils Absolute: 0.1 10*3/uL (ref 0.0–0.5)
HCT: 32 % — ABNORMAL LOW (ref 38.4–49.9)
LYMPH%: 19.6 % (ref 14.0–49.0)
MCH: 31.2 pg (ref 27.2–33.4)
MCHC: 33.4 g/dL (ref 32.0–36.0)
MCV: 93.3 fL (ref 79.3–98.0)
MONO#: 0.5 10*3/uL (ref 0.1–0.9)
MONO%: 6.6 % (ref 0.0–14.0)
NEUT#: 5.5 10*3/uL (ref 1.5–6.5)
NEUT%: 72.6 % (ref 39.0–75.0)
Platelets: 148 10*3/uL (ref 140–400)
RBC: 3.43 10*6/uL — ABNORMAL LOW (ref 4.20–5.82)
RDW: 14 % (ref 11.0–14.6)
WBC: 7.6 10*3/uL (ref 4.0–10.3)
lymph#: 1.5 10*3/uL (ref 0.9–3.3)

## 2014-03-21 LAB — IRON AND TIBC CHCC
%SAT: 29 % (ref 20–55)
Iron: 89 ug/dL (ref 42–163)
TIBC: 311 ug/dL (ref 202–409)
UIBC: 222 ug/dL (ref 117–376)

## 2014-03-21 LAB — FERRITIN CHCC: Ferritin: 21 ng/ml — ABNORMAL LOW (ref 22–316)

## 2014-03-25 LAB — ERYTHROPOIETIN: ERYTHROPOIETIN: 35.3 m[IU]/mL — AB (ref 2.6–18.5)

## 2014-04-01 ENCOUNTER — Telehealth: Payer: Self-pay

## 2014-04-01 NOTE — Telephone Encounter (Signed)
S/w wife to clarify date and time of next appt

## 2014-04-07 ENCOUNTER — Ambulatory Visit (HOSPITAL_BASED_OUTPATIENT_CLINIC_OR_DEPARTMENT_OTHER): Payer: Medicare FFS | Admitting: Internal Medicine

## 2014-04-07 ENCOUNTER — Other Ambulatory Visit (HOSPITAL_BASED_OUTPATIENT_CLINIC_OR_DEPARTMENT_OTHER): Payer: Medicare FFS

## 2014-04-07 VITALS — BP 124/52 | HR 81 | Temp 98.1°F | Resp 17 | Ht 63.0 in | Wt 130.0 lb

## 2014-04-07 DIAGNOSIS — D638 Anemia in other chronic diseases classified elsewhere: Secondary | ICD-10-CM

## 2014-04-07 DIAGNOSIS — D649 Anemia, unspecified: Secondary | ICD-10-CM

## 2014-04-07 DIAGNOSIS — K7689 Other specified diseases of liver: Secondary | ICD-10-CM

## 2014-04-07 DIAGNOSIS — I4891 Unspecified atrial fibrillation: Secondary | ICD-10-CM

## 2014-04-07 DIAGNOSIS — J449 Chronic obstructive pulmonary disease, unspecified: Secondary | ICD-10-CM

## 2014-04-07 DIAGNOSIS — J4489 Other specified chronic obstructive pulmonary disease: Secondary | ICD-10-CM

## 2014-04-07 LAB — CBC WITH DIFFERENTIAL/PLATELET
BASO%: 0.3 % (ref 0.0–2.0)
BASOS ABS: 0 10*3/uL (ref 0.0–0.1)
EOS%: 1.1 % (ref 0.0–7.0)
Eosinophils Absolute: 0.1 10*3/uL (ref 0.0–0.5)
HEMATOCRIT: 30.9 % — AB (ref 38.4–49.9)
HEMOGLOBIN: 10.3 g/dL — AB (ref 13.0–17.1)
LYMPH#: 1.2 10*3/uL (ref 0.9–3.3)
LYMPH%: 15.5 % (ref 14.0–49.0)
MCH: 31.2 pg (ref 27.2–33.4)
MCHC: 33.4 g/dL (ref 32.0–36.0)
MCV: 93.4 fL (ref 79.3–98.0)
MONO#: 0.6 10*3/uL (ref 0.1–0.9)
MONO%: 7.7 % (ref 0.0–14.0)
NEUT#: 5.6 10*3/uL (ref 1.5–6.5)
NEUT%: 75.4 % — AB (ref 39.0–75.0)
Platelets: 190 10*3/uL (ref 140–400)
RBC: 3.31 10*6/uL — ABNORMAL LOW (ref 4.20–5.82)
RDW: 14.2 % (ref 11.0–14.6)
WBC: 7.5 10*3/uL (ref 4.0–10.3)

## 2014-04-07 LAB — COMPREHENSIVE METABOLIC PANEL (CC13)
ALT: 15 U/L (ref 0–55)
ANION GAP: 6 meq/L (ref 3–11)
AST: 20 U/L (ref 5–34)
Albumin: 2.8 g/dL — ABNORMAL LOW (ref 3.5–5.0)
Alkaline Phosphatase: 102 U/L (ref 40–150)
BUN: 29.4 mg/dL — AB (ref 7.0–26.0)
CALCIUM: 8.6 mg/dL (ref 8.4–10.4)
CHLORIDE: 104 meq/L (ref 98–109)
CO2: 29 mEq/L (ref 22–29)
CREATININE: 1 mg/dL (ref 0.7–1.3)
GLUCOSE: 90 mg/dL (ref 70–140)
Potassium: 4.7 mEq/L (ref 3.5–5.1)
Sodium: 139 mEq/L (ref 136–145)
Total Bilirubin: 0.38 mg/dL (ref 0.20–1.20)
Total Protein: 6.5 g/dL (ref 6.4–8.3)

## 2014-04-07 NOTE — Progress Notes (Signed)
[facility] OFFICE PROGRESS NOTE  [name], Cory Lawrence, Cory Lawrence [address]  [place] [address]  DIAGNOSIS: Anemia - Plan: CBC with Differential, Ferritin, Iron and TIBC [facility], CBC with Differential, Basic metabolic panel (Bmet) - [facility], Iron and TIBC [facility], Ferritin  Chief Complaint  Patient presents with  . Anemia    CURRENT TREATMENT: Ferrex 150 forte Plus once daily  INTERVAL HISTORY: Cory Lawrence 80 y.o. male  with history of CAD/CHF, paroxysmal atrial fibrillation on xalreto, DM2, primary biliary cirrhosis and NASH who was referred to our office for further evaluation of his anemia on 03/07/2014.     Today, he is accompanied by his wife Cory Lawrence. He saw Cory Lawrence on 02/25/2014 and reportedly had a CXR the week prior. In addition, he was found to have a low hemoglobin of 10 with iron levels within normal range. He saw Cory Lawrence, a urologist, about four months and was prescribed finasteride for BPH with improvement in his symptoms of nocturia. Regarding his anemia work-up, he has been followed by Cory Lawrence (now Cory Lawrence) of GI with his last colonoscopy three years ago and at which time he had removal of multiple polyps and severe diverticulosis. Cory Lawrence also follows him up for NASH complicated by cirrhosis (liver biopsy 11 years ago) and primary biliary cirrhosis with his last visit on 004/21/2015. He is been on Actigall and 50 mg twice a day for years. Abdominal ultrasound exam one year ago was unremarkable without evidence of ascites or portal hypertension. In the past, endoscopy showed evidence of mild portal gastropathy. Due to his anemia, he has been receiving the vitamin B12 shots over the past 2 years. He reports walking one mile a day. He has not done these walks over the past few weeks due to knee problems. His CBC on 02/18/2014 revealed a WBC of 6.9; Hemoglobin 10; Hematocrit 29.2; Platelets 153. His CMP revealed a creatinine of 1.09; normal liver vfunction  tests. He also has COPD (prior smoker, quit 20 years ago, 40 pack years). He takes iron supplementation daily for the past one year. He denies hematochezia but reports dark stools.   MEDICAL HISTORY: Past Medical History  Diagnosis Date  . Dizziness   . Diaphoresis   . Palpitations   . COPD (chronic obstructive pulmonary disease)   . Hypertension   . Hyperlipidemia   . GERD (gastroesophageal reflux disease)   . BPH (benign prostatic hypertrophy)   . Personal history of colonic polyps 07/08/2002,06/2011    tubular adenoma, hyperplastic  . Internal hemorrhoids without mention of complication   . Diverticulosis of colon (without mention of hemorrhage)   . Biliary cirrhosis   . Unspecified gastritis and gastroduodenitis without mention of hemorrhage   . NASH (nonalcoholic steatohepatitis)   . Anxiety   . Arthritis   . Ulcer     Gastric/bleeding  . Diabetes mellitus type 2, diet-controlled     managed with diet  . Vitamin B12 deficiency   . PAF (paroxysmal atrial fibrillation)     on Xarelto  . CAD (coronary artery disease)     prior abnormal Myoview; subsequent cath; managed medically  . Heart attack     INTERIM HISTORY: has COLONIC POLYPS, ADENOMATOUS; HYPERLIPIDEMIA; OBESITY; HYPERTENSION; INTERNAL HEMORRHOIDS; COPD; GERD; DIVERTICULOSIS, COLON; BILIARY CIRRHOSIS, PRIMARY; BENIGN PROSTATIC HYPERTROPHY, HX OF; Syncope and collapse; Dizziness; Diaphoresis; Chest pain; Chest tightness; Palpitations; COPD (chronic obstructive pulmonary disease); Hypertension; GERD (gastroesophageal reflux disease); BPH (benign prostatic hypertrophy); CAD (coronary artery disease); Other  general symptoms ; NASH (nonalcoholic steatohepatitis); Cirrhosis of liver not due to alcohol ; Personal history of colonic polyps; Other B-complex deficiencies; Diabetes mellitus type 2, diet-controlled; Vitamin B12 deficiency; COPD with bronchitis; History of gastroesophageal reflux (GERD); Atrial Fibrillation; LV  dysfunction; Weight loss; and Anemia on his problem list.    ALLERGIES:  is allergic to penicillins.  MEDICATIONS: has a current medication list which includes the following prescription(s): alprazolam, cyanocobalamin, digoxin, doxazosin, ferrex 150 forte plus, finasteride, fluticasone-salmeterol, ipratropium-albuterol, isosorbide mononitrate, melatonin, multiple vitamins-minerals, fish oil, pantoprazole, rivaroxaban, simvastatin, tamsulosin, triamcinolone ointment, and ursodiol.  SURGICAL HISTORY:  Past Surgical History  Procedure Laterality Date  . Cataract extraction      bilateral  . Eye surgery      left eye x multiple  . Cardiac catheterization  March 2013    Managed medically    REVIEW OF SYSTEMS:   Constitutional: Denies fevers, chills or abnormal weight loss Eyes: Denies blurriness of vision Ears, nose, mouth, throat, and face: Denies mucositis or sore throat Respiratory: Denies cough, dyspnea or wheezes Cardiovascular: Denies palpitation, chest discomfort or lower extremity swelling Gastrointestinal:  Denies nausea, heartburn or change in bowel habits Skin: Denies abnormal skin rashes Lymphatics: Denies new lymphadenopathy or easy bruising Neurological:Denies numbness, tingling or new weaknesses Behavioral/Psych: Mood is stable, no new changes  All other systems were reviewed with the patient and are negative.  PHYSICAL EXAMINATION: ECOG PERFORMANCE STATUS: 1 - Symptomatic but completely ambulatory  Blood pressure 124/52, pulse 81, temperature 98.1 F (36.7 C), temperature source Oral, resp. rate 17, height 5\' 3"  (1.6 m), weight 130 lb (58.968 kg), SpO2 98.00%.  GENERAL:alert, no distress and comfortable; elderly male easily mobile to the exam.  SKIN: skin color, texture, turgor are normal, no rashes or significant lesions EYES: normal, Conjunctiva are pink and non-injected, sclera clear OROPHARYNX:no exudate, no erythema and lips, buccal mucosa, and tongue normal   NECK: supple, thyroid normal size, non-tender, without nodularity LYMPH:  no palpable lymphadenopathy in the cervical, axillary or supraclavicular LUNGS: clear to auscultation with normal breathing effort, no wheezes or rhonchi HEART: regular rate & rhythm and no murmurs and no lower extremity edema ABDOMEN:abdomen soft, non-tender and normal bowel sounds Musculoskeletal:no cyanosis of digits and no clubbing  NEURO: alert & oriented x 3 with fluent speech, no focal motor/sensory deficits  Labs:  Lab Results  Component Value Date   WBC 7.5 04/07/2014   HGB 10.3* 04/07/2014   HCT 30.9* 04/07/2014   MCV 93.4 04/07/2014   PLT 190 04/07/2014   NEUTROABS 5.6 04/07/2014      Chemistry      Component Value Date/Time   NA 139 04/07/2014 1507   NA 139 02/18/2014 1056   K 4.7 04/07/2014 1507   K 3.9 02/18/2014 1056   CL 104 02/18/2014 1056   CO2 29 04/07/2014 1507   CO2 26 02/18/2014 1056   BUN 29.4* 04/07/2014 1507   BUN 36* 02/18/2014 1056   CREATININE 1.0 04/07/2014 1507   CREATININE 1.09 02/18/2014 1056   CREATININE 1.2 03/02/2012 1505      Component Value Date/Time   CALCIUM 8.6 04/07/2014 1507   CALCIUM 8.6 02/18/2014 1056   ALKPHOS 102 04/07/2014 1507   ALKPHOS 68 02/18/2014 1056   AST 20 04/07/2014 1507   AST 16 02/18/2014 1056   ALT 15 04/07/2014 1507   ALT 16 02/18/2014 1056   BILITOT 0.38 04/07/2014 1507   BILITOT 0.4 02/18/2014 1056       Basic Metabolic  Panel:  Recent Labs Lab 04/07/14 1507  NA 139  K 4.7  CO2 29  GLUCOSE 90  BUN 29.4*  CREATININE 1.0  CALCIUM 8.6   GFR Estimated Creatinine Clearance: 41.9 ml/min (by C-G formula based on Cr of 1). Liver Function Tests:  Recent Labs Lab 04/07/14 1507  AST 20  ALT 15  ALKPHOS 102  BILITOT 0.38  PROT 6.5  ALBUMIN 2.8*   No results found for this basename: LIPASE, AMYLASE,  in the last 168 hours No results found for this basename: AMMONIA,  in the last 168 hours Coagulation profile No results found for this basename: INR, PROTIME,   in the last 168 hours  CBC:  Recent Labs Lab 04/07/14 1507  WBC 7.5  NEUTROABS 5.6  HGB 10.3*  HCT 30.9*  MCV 93.4  PLT 190   Results for YANIV, LAGE (MRN [id number]) as of 04/08/2014 09:32  Ref. Range 03/21/2014 11:45  Iron Latest Range: 42-165 ug/dL 89  UIBC Latest Range: 125-400 ug/dL 222  TIBC Latest Range: 215-435 ug/dL 311  %SAT Latest Range: 20-55 % 29  Ferritin Latest Range: 22-322 ng/mL 21 (L)   Anemia work up No results found for this basename: VITAMINB12, FOLATE, FERRITIN, TIBC, IRON, RETICCTPCT,  in the last 72 hours  Studies:  No results found.   RADIOGRAPHIC STUDIES: No results found.  ASSESSMENT: Traye Bates 80 y.o. male with a history of Anemia - Plan: CBC with Differential, Ferritin, Iron and TIBC [facility], CBC with Differential, Basic metabolic panel (Bmet) - [facility], Iron and TIBC [facility], Ferritin   PLAN:   1. Anemia NOS.  -- His hemoglobin on 02/25/2014 was 10.4. With an MCV of 92.9. Today, it is 10.3. He has a normocytic anemia of chronic disease. He denies symptoms of anemia including chest pain and dyspnea on exertion. He is being followed closely with GI [facility]. His EGD showed mild gastropathy. Colonoscopy in August does demonstrate severe diverticulosis. Patient is on both vitamin B12 and iron.   We will continue current management and increase his Ferrex to bid plus Vitamin C. . He will have repeat labs in one month and then in 2 months with a follow up. His creatinine clearance is 41.9 mL/min; he could also have some mild anemia of chronic kidney disease.   2. NASH complicated by cirrhosis, PBC.  --Followed by GI (Cory Lawrence).   3. Atrial Fib/CHF/CAD.  --On Xarelto.  All questions were answered. The patient knows to call the clinic with any problems, questions or concerns. We can certainly see the patient much sooner if necessary.  I spent 15 minutes counseling the patient face to face. The total time spent in the appointment was  25 minutes.    Concha Norway, Cory Lawrence 04/08/2014 9:32 AM

## 2014-04-08 ENCOUNTER — Telehealth: Payer: Self-pay | Admitting: Internal Medicine

## 2014-04-08 NOTE — Telephone Encounter (Signed)
lvm for pt regarding to July and Aug appt...mailed pt appt sched/avs and letter °

## 2014-05-05 ENCOUNTER — Ambulatory Visit (INDEPENDENT_AMBULATORY_CARE_PROVIDER_SITE_OTHER): Payer: Medicare PPO | Admitting: Emergency Medicine

## 2014-05-05 ENCOUNTER — Other Ambulatory Visit (HOSPITAL_BASED_OUTPATIENT_CLINIC_OR_DEPARTMENT_OTHER): Payer: Medicare PPO

## 2014-05-05 VITALS — BP 128/60 | HR 85 | Temp 97.9°F | Resp 16 | Ht 64.0 in | Wt 129.0 lb

## 2014-05-05 DIAGNOSIS — D649 Anemia, unspecified: Secondary | ICD-10-CM

## 2014-05-05 DIAGNOSIS — M7989 Other specified soft tissue disorders: Secondary | ICD-10-CM

## 2014-05-05 DIAGNOSIS — T7840XA Allergy, unspecified, initial encounter: Secondary | ICD-10-CM

## 2014-05-05 DIAGNOSIS — R634 Abnormal weight loss: Secondary | ICD-10-CM

## 2014-05-05 DIAGNOSIS — D638 Anemia in other chronic diseases classified elsewhere: Secondary | ICD-10-CM

## 2014-05-05 LAB — CBC WITH DIFFERENTIAL/PLATELET
BASO%: 0.1 % (ref 0.0–2.0)
BASOS ABS: 0 10*3/uL (ref 0.0–0.1)
BASOS ABS: 0 10*3/uL (ref 0.0–0.1)
BASOS PCT: 0 % (ref 0–1)
EOS ABS: 0.1 10*3/uL (ref 0.0–0.5)
EOS PCT: 1 % (ref 0–5)
EOS%: 0.9 % (ref 0.0–7.0)
Eosinophils Absolute: 0.1 10*3/uL (ref 0.0–0.7)
HCT: 30.7 % — ABNORMAL LOW (ref 38.4–49.9)
HCT: 29.1 % — ABNORMAL LOW (ref 39.0–52.0)
HEMOGLOBIN: 9.9 g/dL — AB (ref 13.0–17.1)
Hemoglobin: 10 g/dL — ABNORMAL LOW (ref 13.0–17.0)
LYMPH%: 16.1 % (ref 14.0–49.0)
Lymphocytes Relative: 16 % (ref 12–46)
Lymphs Abs: 1.3 10*3/uL (ref 0.7–4.0)
MCH: 30.1 pg (ref 27.2–33.4)
MCH: 30 pg (ref 26.0–34.0)
MCHC: 32.2 g/dL (ref 32.0–36.0)
MCHC: 34.4 g/dL (ref 30.0–36.0)
MCV: 93.3 fL (ref 79.3–98.0)
MCV: 87.4 fL (ref 78.0–100.0)
MONO#: 0.4 10*3/uL (ref 0.1–0.9)
MONO%: 5.4 % (ref 0.0–14.0)
Monocytes Absolute: 0.4 10*3/uL (ref 0.1–1.0)
Monocytes Relative: 5 % (ref 3–12)
NEUT%: 77.5 % — ABNORMAL HIGH (ref 39.0–75.0)
NEUTROS ABS: 6.3 10*3/uL (ref 1.5–6.5)
Neutro Abs: 6.3 10*3/uL (ref 1.7–7.7)
Neutrophils Relative %: 78 % — ABNORMAL HIGH (ref 43–77)
PLATELETS: 231 10*3/uL (ref 150–400)
Platelets: 196 10*3/uL (ref 140–400)
RBC: 3.29 10*6/uL — ABNORMAL LOW (ref 4.20–5.82)
RBC: 3.33 MIL/uL — ABNORMAL LOW (ref 4.22–5.81)
RDW: 14.1 % (ref 11.0–14.6)
RDW: 14.4 % (ref 11.5–15.5)
WBC: 8.2 10*3/uL (ref 4.0–10.3)
WBC: 8.1 10*3/uL (ref 4.0–10.5)
lymph#: 1.3 10*3/uL (ref 0.9–3.3)

## 2014-05-05 LAB — IRON AND TIBC CHCC
%SAT: 18 % — ABNORMAL LOW (ref 20–55)
IRON: 40 ug/dL — AB (ref 42–163)
TIBC: 221 ug/dL (ref 202–409)
UIBC: 182 ug/dL (ref 117–376)

## 2014-05-05 LAB — POCT SEDIMENTATION RATE: POCT SED RATE: 64 mm/hr — AB (ref 0–22)

## 2014-05-05 LAB — FERRITIN CHCC: Ferritin: 58 ng/ml (ref 22–316)

## 2014-05-05 NOTE — Addendum Note (Signed)
Addended by: Shary Key E on: 05/05/2014 01:54 PM   Modules accepted: Level of Service

## 2014-05-05 NOTE — Progress Notes (Signed)
   Subjective:    Patient ID: Cory Lawrence, male    DOB: 1925/07/03, 80 y.o.   MRN: [id number]  HPI pt presents with allergic reaction. He has been on Cipro from his urologist for a UTI. He was catheterized but has been fine since then. He is swelling in his hands. Negative for feet swelling. They have made a change for patient to see Dr. Gaynelle Arabian on his next visit. He continues to lose weight. He is under the care of Dr. Lorelle Formosa at the cancer center. He may need an infusion of iron in the near future. He continues to lose weight. Patient recently was treated with Cipro followed by Levaquin and now has developed severe pain and swelling of both hands    Review of Systems     Objective:   Physical Exam physical exam reveals a frail cooperative gentleman who is in no distress. Neck is supple. Chest was clear heart rate no murmurs abdomen soft nontender extremity exam reveals significant swelling of the upper extremities. There is no distention of the neck veins noted. He has severe pain with flexion-extension of the fingers        Assessment & Plan:  I suspect this is a severe reaction to quinolones. We will send him to Dr. Jeannie Fend for his opinion. Weight loss continues to be a significant problem. Concern over an occult cancer such as pancreatic. We'll schedule a CT of the chest and abdomen. He is encouraged to increase his protein intake. Routine labs were done to include markers for inflammatory arthritis.

## 2014-05-06 LAB — ANA: Anti Nuclear Antibody(ANA): NEGATIVE

## 2014-05-06 LAB — RHEUMATOID FACTOR: Rhuematoid fact SerPl-aCnc: <10 IU/mL (ref <=14)

## 2014-05-09 ENCOUNTER — Other Ambulatory Visit: Payer: Medicare FFS

## 2014-05-13 ENCOUNTER — Telehealth: Payer: Self-pay | Admitting: Emergency Medicine

## 2014-05-13 NOTE — Telephone Encounter (Signed)
Spoke to patient's wife and advised her to have patient see Dr. Everlene Farrier on Monday between 8 and 2.  She said they would be there.

## 2014-05-13 NOTE — Telephone Encounter (Signed)
Have patient see me next week to check his hands. See me Monday 8 to 2

## 2014-05-16 ENCOUNTER — Ambulatory Visit
Admission: RE | Admit: 2014-05-16 | Discharge: 2014-05-16 | Disposition: A | Discharge status: Home / Self Care | Payer: Medicare PPO | Source: Ambulatory Visit | Attending: Emergency Medicine | Admitting: Emergency Medicine

## 2014-05-16 ENCOUNTER — Telehealth: Payer: Self-pay

## 2014-05-16 DIAGNOSIS — D649 Anemia, unspecified: Secondary | ICD-10-CM

## 2014-05-16 DIAGNOSIS — R634 Abnormal weight loss: Secondary | ICD-10-CM

## 2014-05-16 NOTE — Telephone Encounter (Signed)
Dr. Everlene Farrier- you wanted to see pt next week in regards to his hands. He has an appt on Tuesday, can this be followed up on at this visit or do you need them to come both days?

## 2014-05-16 NOTE — Telephone Encounter (Signed)
Patient's wife called and states that she received a call that Dr. Everlene Farrier requests to see patient on Monday. Wife states that her husband already has an appt with Dr. Everlene Farrier on Tuesday and they want to know if he will need to come both Mon and Tues or only one of those days. CB # [phone]

## 2014-05-17 NOTE — Telephone Encounter (Signed)
Tuesday is fine for appt.

## 2014-05-17 NOTE — Telephone Encounter (Signed)
LMOM that Tuesday is fine.

## 2014-05-20 ENCOUNTER — Encounter: Payer: Self-pay | Admitting: Emergency Medicine

## 2014-05-20 ENCOUNTER — Ambulatory Visit (INDEPENDENT_AMBULATORY_CARE_PROVIDER_SITE_OTHER): Payer: Medicare PPO | Admitting: Emergency Medicine

## 2014-05-20 VITALS — BP 130/57 | HR 78 | Temp 97.9°F | Resp 18 | Ht 63.0 in | Wt 127.8 lb

## 2014-05-20 DIAGNOSIS — M79609 Pain in unspecified limb: Secondary | ICD-10-CM

## 2014-05-20 DIAGNOSIS — Z5189 Encounter for other specified aftercare: Secondary | ICD-10-CM

## 2014-05-20 DIAGNOSIS — M79641 Pain in right hand: Secondary | ICD-10-CM

## 2014-05-20 DIAGNOSIS — T50905D Adverse effect of unspecified drugs, medicaments and biological substances, subsequent encounter: Secondary | ICD-10-CM

## 2014-05-20 DIAGNOSIS — M79642 Pain in left hand: Principal | ICD-10-CM

## 2014-05-20 DIAGNOSIS — D649 Anemia, unspecified: Secondary | ICD-10-CM

## 2014-05-20 DIAGNOSIS — R634 Abnormal weight loss: Secondary | ICD-10-CM

## 2014-05-20 MED ORDER — DICLOFENAC SODIUM 1 % TD GEL: 2.0000 g | Freq: Three times a day (TID) | TRANSDERMAL | Status: DC

## 2014-05-20 NOTE — Progress Notes (Signed)
   Subjective:    Patient ID: Cory Lawrence, male    DOB: January 18, 1925, 80 y.o.   MRN: [id number]  HPI patient enters for followup of his adverse reaction to quinolones with swelling in both hands. His sedimentation rate was over 60 but RA nand ANA titers were negative. He still has significant swelling of both hands with difficulty flexing his fingers. He underwent a CT chest and abdomen and no evidence of occult cancer was found on these films. He has bilateral large renal cysts but nothing to explain his weight loss. It appears he has lost a little over 10 pounds in less than a year.    Review of Systems     Objective:   Physical Exam pale but alert cooperative gentleman in no distress. Neck supple chest exam revealed decreased breath sounds in the bases cardiac revealed a regular rate without murmurs abdomen soft nontender examination of the hands reveals puffiness involving all the fingers of both hands with inability to close the fingers into a fist.        Assessment & Plan:  We'll see if we can get him an appointment to see Dr. Posey Rea  for her opinion regarding his what appears to be tendinitis reaction to the quinolones. He has a followup appointment with Dr. Juliann Mule regarding his anemia.

## 2014-05-28 ENCOUNTER — Telehealth: Payer: Self-pay | Admitting: Internal Medicine

## 2014-05-28 NOTE — Telephone Encounter (Signed)
, °

## 2014-05-31 ENCOUNTER — Other Ambulatory Visit: Payer: Self-pay | Admitting: Emergency Medicine

## 2014-06-02 ENCOUNTER — Telehealth: Payer: Self-pay | Admitting: Dietician

## 2014-06-02 ENCOUNTER — Ambulatory Visit: Payer: Medicare FFS

## 2014-06-02 ENCOUNTER — Other Ambulatory Visit: Payer: Medicare FFS

## 2014-06-02 NOTE — Telephone Encounter (Signed)
Faxed

## 2014-06-02 NOTE — Telephone Encounter (Signed)
Brief Outpatient Oncology Nutrition Note  Patient has been identified to be at risk on malnutrition screen.  Wt Readings from Last 10 Encounters:  05/20/14 127 lb 12.8 oz (57.97 kg)  05/05/14 129 lb (58.514 kg)  04/07/14 130 lb (58.968 kg)  03/07/14 133 lb 6.4 oz (60.51 kg)  02/25/14 133 lb (60.328 kg)  02/18/14 133 lb 3.2 oz (60.419 kg)  02/18/14 134 lb 6 oz (60.952 kg)  12/11/13 136 lb 6.4 oz (61.871 kg)  11/12/13 134 lb 6.4 oz (60.963 kg)  07/02/13 137 lb (62.143 kg)    Dx: anemia  Called patient due to weight loss.  Wife reports reaction to Cipro and has been in increased joint pain.  Decreased appetite and early satiety.  Drinks Premier protein shake at times.  Eats balanced, regular meals.  Encouraged 3 meals and 2 snacks daily.  Use of nutritional shake as needed to maintain weight.  Wife to call for any questions.  Antonieta Iba, RD, LDN

## 2014-06-03 ENCOUNTER — Telehealth: Payer: Self-pay

## 2014-06-03 DIAGNOSIS — R29818 Other symptoms and signs involving the nervous system: Secondary | ICD-10-CM

## 2014-06-03 DIAGNOSIS — Z7409 Other reduced mobility: Secondary | ICD-10-CM

## 2014-06-03 NOTE — Telephone Encounter (Signed)
SHIRLEY WOULD LIKE DR [name] TO WRITE A PRESCRIPTION FOR HER HUSBAND TO GET A POTTY CHAIR. PLEASE CALL [phone] AND SHE WILL PICK UP

## 2014-06-03 NOTE — Telephone Encounter (Signed)
Please advise 

## 2014-06-03 NOTE — Telephone Encounter (Signed)
Please order a potty chair for the patient.

## 2014-06-04 NOTE — Telephone Encounter (Signed)
Put in order, patient has had recent decline / needs bedside commode.

## 2014-06-07 ENCOUNTER — Other Ambulatory Visit: Payer: Self-pay | Admitting: Emergency Medicine

## 2014-06-16 ENCOUNTER — Other Ambulatory Visit (HOSPITAL_BASED_OUTPATIENT_CLINIC_OR_DEPARTMENT_OTHER): Payer: Medicare PPO

## 2014-06-16 ENCOUNTER — Ambulatory Visit (HOSPITAL_BASED_OUTPATIENT_CLINIC_OR_DEPARTMENT_OTHER): Payer: Medicare PPO

## 2014-06-16 ENCOUNTER — Ambulatory Visit (HOSPITAL_BASED_OUTPATIENT_CLINIC_OR_DEPARTMENT_OTHER): Payer: Medicare PPO | Admitting: Internal Medicine

## 2014-06-16 ENCOUNTER — Telehealth: Payer: Self-pay | Admitting: Internal Medicine

## 2014-06-16 VITALS — BP 127/43 | HR 65 | Temp 97.9°F | Resp 17 | Ht 63.0 in | Wt 125.7 lb

## 2014-06-16 DIAGNOSIS — D649 Anemia, unspecified: Secondary | ICD-10-CM

## 2014-06-16 LAB — BASIC METABOLIC PANEL (CC13)
Anion Gap: 7 mEq/L (ref 3–11)
BUN: 42.8 mg/dL — ABNORMAL HIGH (ref 7.0–26.0)
CALCIUM: 8.5 mg/dL (ref 8.4–10.4)
CO2: 26 mEq/L (ref 22–29)
Chloride: 104 mEq/L (ref 98–109)
Creatinine: 1.2 mg/dL (ref 0.7–1.3)
GLUCOSE: 164 mg/dL — AB (ref 70–140)
Potassium: 4.1 mEq/L (ref 3.5–5.1)
Sodium: 137 mEq/L (ref 136–145)

## 2014-06-16 LAB — CBC WITH DIFFERENTIAL/PLATELET
BASO%: 0.4 % (ref 0.0–2.0)
Basophils Absolute: 0 10*3/uL (ref 0.0–0.1)
EOS%: 0.7 % (ref 0.0–7.0)
Eosinophils Absolute: 0.1 10*3/uL (ref 0.0–0.5)
HEMATOCRIT: 28.4 % — AB (ref 38.4–49.9)
HGB: 9.3 g/dL — ABNORMAL LOW (ref 13.0–17.1)
LYMPH#: 0.9 10*3/uL (ref 0.9–3.3)
LYMPH%: 11.6 % — AB (ref 14.0–49.0)
MCH: 30 pg (ref 27.2–33.4)
MCHC: 32.6 g/dL (ref 32.0–36.0)
MCV: 92 fL (ref 79.3–98.0)
MONO#: 0.5 10*3/uL (ref 0.1–0.9)
MONO%: 6.6 % (ref 0.0–14.0)
NEUT#: 6.2 10*3/uL (ref 1.5–6.5)
NEUT%: 80.7 % — ABNORMAL HIGH (ref 39.0–75.0)
PLATELETS: 239 10*3/uL (ref 140–400)
RBC: 3.09 10*6/uL — ABNORMAL LOW (ref 4.20–5.82)
RDW: 15.6 % — ABNORMAL HIGH (ref 11.0–14.6)
WBC: 7.7 10*3/uL (ref 4.0–10.3)

## 2014-06-16 LAB — IRON AND TIBC CHCC
%SAT: 22 % (ref 20–55)
Iron: 44 ug/dL (ref 42–163)
TIBC: 204 ug/dL (ref 202–409)
UIBC: 159 ug/dL (ref 117–376)

## 2014-06-16 LAB — FERRITIN CHCC: Ferritin: 82 ng/ml (ref 22–316)

## 2014-06-16 MED ORDER — SODIUM CHLORIDE 0.9 % IV SOLN
510.0000 mg | Freq: Once | INTRAVENOUS | Status: AC
  Administered 2014-06-16: 510 mg via INTRAVENOUS
  Filled 2014-06-16: qty 17

## 2014-06-16 MED ORDER — SODIUM CHLORIDE 0.9 % IV SOLN
Freq: Once | INTRAVENOUS | Status: AC
  Administered 2014-06-16: 15:00:00 via INTRAVENOUS

## 2014-06-16 NOTE — Telephone Encounter (Signed)
Pt confirmed labs/ov per 08/17 POF, gave pt AVS.....KJ °

## 2014-06-16 NOTE — Progress Notes (Signed)
[facility] OFFICE PROGRESS NOTE  [name], Cory Sayre, MD [place] [address]  DIAGNOSIS: Anemia, unspecified anemia type - Plan: CBC with Differential, Ferritin, Iron and TIBC [facility], CBC with Differential, Basic metabolic panel (Bmet) - [facility], Ferritin, Iron and TIBC [facility]  Chief Complaint  Patient presents with  . Anemia    CURRENT TREATMENT: Ferrex 150 forte Plus twice daily  INTERVAL HISTORY: Cory Lawrence 80 y.o. male  with history of CAD/CHF, paroxysmal atrial fibrillation on xalreto, DM2, primary biliary cirrhosis and NASH who was referred to our office for further evaluation of his anemia on 03/07/2014.     Today, he is accompanied by his wife Cory Lawrence. He saw Dr. Everlene Farrier on 02/25/2014 and reportedly had a CXR the week prior. In addition, he was found to have a low hemoglobin of 10 with iron levels within normal range. He saw Dr. Jasmine December, a urologist, about four months and was prescribed finasteride for BPH with improvement in his symptoms of nocturia. Regarding his anemia work-up, he has been followed by Dr. Verl Blalock (now Dr. Deatra Ina) of GI with his last colonoscopy three years ago and at which time he had removal of multiple polyps and severe diverticulosis. Dr. Deatra Ina also follows him up for NASH complicated by cirrhosis (liver biopsy 11 years ago) and primary biliary cirrhosis with his last visit on 004/21/2015. He is been on Actigall 50 mg twice a day for years. Abdominal ultrasound exam one year ago was unremarkable without evidence of ascites or portal hypertension. In the past, endoscopy showed evidence of mild portal gastropathy. Due to his anemia, he has been receiving the vitamin B12 shots over the past 2 years. He reports walking one mile a day. He has not done these walks over the past few weeks due to knee problems. His CBC on 02/18/2014 revealed a WBC of 6.9; Hemoglobin 10; Hematocrit 29.2; Platelets 153. His CMP revealed a creatinine of 1.09;  normal liver vfunction tests. He also has COPD (prior smoker, quit 20 years ago, 40 pack years). He takes iron supplementation daily for the past one year. He denies hematochezia but reports dark stools.   MEDICAL HISTORY: Past Medical History  Diagnosis Date  . Dizziness   . Diaphoresis   . Palpitations   . COPD (chronic obstructive pulmonary disease)   . Hypertension   . Hyperlipidemia   . GERD (gastroesophageal reflux disease)   . BPH (benign prostatic hypertrophy)   . Personal history of colonic polyps 07/08/2002,06/2011    tubular adenoma, hyperplastic  . Internal hemorrhoids without mention of complication   . Diverticulosis of colon (without mention of hemorrhage)   . Biliary cirrhosis   . Unspecified gastritis and gastroduodenitis without mention of hemorrhage   . NASH (nonalcoholic steatohepatitis)   . Anxiety   . Arthritis   . Ulcer     Gastric/bleeding  . Diabetes mellitus type 2, diet-controlled     managed with diet  . Vitamin B12 deficiency   . PAF (paroxysmal atrial fibrillation)     on Xarelto  . CAD (coronary artery disease)     prior abnormal Myoview; subsequent cath; managed medically  . Heart attack     INTERIM HISTORY: has COLONIC POLYPS, ADENOMATOUS; HYPERLIPIDEMIA; OBESITY; HYPERTENSION; INTERNAL HEMORRHOIDS; COPD; GERD; DIVERTICULOSIS, COLON; BILIARY CIRRHOSIS, PRIMARY; BENIGN PROSTATIC HYPERTROPHY, HX OF; Syncope and collapse; Dizziness; Diaphoresis; Chest pain; Chest tightness; Palpitations; COPD (chronic obstructive pulmonary disease); Hypertension; GERD (gastroesophageal reflux disease); BPH (benign prostatic hypertrophy); CAD (coronary artery  disease); Other general symptoms ; NASH (nonalcoholic steatohepatitis); Cirrhosis of liver not due to alcohol ; Personal history of colonic polyps; Other B-complex deficiencies; Diabetes mellitus type 2, diet-controlled; Vitamin B12 deficiency; COPD with bronchitis; History of gastroesophageal reflux (GERD); Atrial  Fibrillation; LV dysfunction; Weight loss; and Anemia on his problem list.    ALLERGIES:  is allergic to ciprofloxacin hcl and penicillins.  MEDICATIONS: has a current medication list which includes the following prescription(s): alprazolam, cyanocobalamin, diclofenac sodium, digoxin, doxazosin, ferrex 150 forte plus, finasteride, fluticasone-salmeterol, ipratropium-albuterol, isosorbide mononitrate, melatonin, multiple vitamins-minerals, fish oil, pantoprazole, rivaroxaban, simvastatin, tamsulosin, triamcinolone ointment, and ursodiol.  SURGICAL HISTORY:  Past Surgical History  Procedure Laterality Date  . Cataract extraction      bilateral  . Eye surgery      left eye x multiple  . Cardiac catheterization  March 2013    Managed medically    REVIEW OF SYSTEMS:   Constitutional: Denies fevers, chills or abnormal weight loss Eyes: Denies blurriness of vision Ears, nose, mouth, throat, and face: Denies mucositis or sore throat Respiratory: Denies cough, dyspnea or wheezes Cardiovascular: Denies palpitation, chest discomfort or lower extremity swelling Gastrointestinal:  Denies nausea, heartburn or change in bowel habits Skin: Denies abnormal skin rashes Lymphatics: Denies new lymphadenopathy or easy bruising Neurological:Denies numbness, tingling or new weaknesses Behavioral/Psych: Mood is stable, no new changes  All other systems were reviewed with the patient and are negative.  PHYSICAL EXAMINATION: ECOG PERFORMANCE STATUS: 1 - Symptomatic but completely ambulatory  Blood pressure 127/43, pulse 65, temperature 97.9 F (36.6 C), temperature source Oral, resp. rate 17, height 5\' 3"  (1.6 m), weight 125 lb 11.2 oz (57.017 kg), SpO2 97.00%.  GENERAL:alert, no distress and comfortable; elderly male easily mobile to the exam.  SKIN: skin color, texture, turgor are normal, no rashes or significant lesions EYES: normal, Conjunctiva are pink and non-injected, sclera clear OROPHARYNX:no  exudate, no erythema and lips, buccal mucosa, and tongue normal  NECK: supple, thyroid normal size, non-tender, without nodularity LYMPH:  no palpable lymphadenopathy in the cervical, axillary or supraclavicular LUNGS: clear to auscultation with normal breathing effort, no wheezes or rhonchi HEART: regular rate & rhythm and no murmurs and no lower extremity edema ABDOMEN:abdomen soft, non-tender and normal bowel sounds Musculoskeletal:no cyanosis of digits and no clubbing  NEURO: alert & oriented x 3 with fluent speech, no focal motor/sensory deficits  Labs:  Lab Results  Component Value Date   WBC 7.7 06/16/2014   HGB 9.3* 06/16/2014   HCT 28.4* 06/16/2014   MCV 92.0 06/16/2014   PLT 239 06/16/2014   NEUTROABS 6.2 06/16/2014      Chemistry      Component Value Date/Time   NA 137 06/16/2014 1330   NA 139 02/18/2014 1056   K 4.1 06/16/2014 1330   K 3.9 02/18/2014 1056   CL 104 02/18/2014 1056   CO2 26 06/16/2014 1330   CO2 26 02/18/2014 1056   BUN 42.8* 06/16/2014 1330   BUN 36* 02/18/2014 1056   CREATININE 1.2 06/16/2014 1330   CREATININE 1.09 02/18/2014 1056   CREATININE 1.2 03/02/2012 1505      Component Value Date/Time   CALCIUM 8.5 06/16/2014 1330   CALCIUM 8.6 02/18/2014 1056   ALKPHOS 102 04/07/2014 1507   ALKPHOS 68 02/18/2014 1056   AST 20 04/07/2014 1507   AST 16 02/18/2014 1056   ALT 15 04/07/2014 1507   ALT 16 02/18/2014 1056   BILITOT 0.38 04/07/2014 1507   BILITOT 0.4 02/18/2014  1056       Basic Metabolic Panel:  Recent Labs Lab 06/16/14 1330  NA 137  K 4.1  CO2 26  GLUCOSE 164*  BUN 42.8*  CREATININE 1.2  CALCIUM 8.5   CBC:  Recent Labs Lab 06/16/14 1329  WBC 7.7  NEUTROABS 6.2  HGB 9.3*  HCT 28.4*  MCV 92.0  PLT 239   Results for Cory Lawrence, Cory Lawrence (MRN [id number]) as of 04/08/2014 09:32  Ref. Range 03/21/2014 11:45  Iron Latest Range: 42-165 ug/dL 89  UIBC Latest Range: 125-400 ug/dL 222  TIBC Latest Range: 215-435 ug/dL 311  %SAT Latest Range: 20-55 %  29  Ferritin Latest Range: 22-322 ng/mL 21 (L)   RADIOGRAPHIC STUDIES: (Personally reviewed by me) 05/16/2014 CT CHEST, ABDOMEN AND PELVIS WITHOUT CONTRAST TECHNIQUE: Multidetector CT imaging of the chest, abdomen and pelvis was performed following the standard protocol without IV contrast.  COMPARISON: None. FINDINGS: CT CHEST FINDINGS  No pneumothorax or pleural effusion is noted. Hyperexpansion of the lungs is noted suggesting chronic obstructive pulmonary disease. Minimal scarring is noted in both lung bases. Emphysematous bulla formation is noted in the right lung base. No acute pulmonary disease is noted. No pulmonary nodule or mass is noted. Atherosclerotic calcifications of thoracic aorta are noted without  aneurysm formation. Coronary artery calcifications are noted suggesting coronary artery disease. No significant mediastinal adenopathy or mass is noted. CT ABDOMEN AND PELVIS FINDINGS No gallstones are noted. No focal abnormality is noted in the liver, spleen or pancreas on these unenhanced images adrenal glands appear normal. 8.8 cm simple cyst arises from left kidney. 8.2 cm cyst  arises from right kidney. No hydronephrosis or renal obstruction is noted. No renal or ureteral calculi are noted. Large amount of stool is noted in the right and transverse colon. There is no evidence of bowel obstruction. No abnormal fluid collection is noted. Atherosclerotic calcifications of abdominal aorta and iliac arteries  are noted without aneurysm formation. At least 2 bladder  diverticulum are noted, with the largest measuring 5 cm in the right posterior portion of the urinary bladder. Bilateral inguinal hernias are noted. The right hernia contains a loop of small bowel, but does not result in obstruction. The left hernia contains a loop of sigmoid colon, but does not result in obstruction. No significant adenopathy is noted.  IMPRESSION: Hyperexpansion of the lungs is noted suggesting chronic obstructive  pulmonary disease.  Coronary artery calcifications are noted suggesting coronary artery disease. Large bilateral simple renal cysts are noted. No hydronephrosis or renal obstruction is noted. No renal or ureteral calculi are noted. Large amount of stool is noted in the right and transverse colon which potentially may represent constipation. 5 cm bladder diverticulum is noted to the right and posterior to the urinary bladder. Bilateral inguinal hernias are noted, with the right inguinal hernia  containing a loop of small bowel without obstruction. The left  inguinal hernia contains a loop of sigmoid colon, also without  obstruction.   ASSESSMENT: Cory Lawrence 80 y.o. male with a history of Anemia, unspecified anemia type - Plan: CBC with Differential, Ferritin, Iron and TIBC [facility], CBC with Differential, Basic metabolic panel (Bmet) - [facility], Ferritin, Iron and TIBC [facility]  Results for Cory Lawrence, Cory Lawrence (MRN [id number]) as of 06/16/2014 16:00  Ref. Range 03/21/2014 11:45  Erythropoietin Latest Range: 2.6-18.5 mIU/mL 35.3 (H)   PLAN:   1. Anemia NOS.  -- His hemoglobin on today is 9.3.  His  Hemoglobin on 02/25/2014 was 10.4.  His Iron is 44, with a ferritin of 21. He has a normocytic anemia of chronic disease. He denies symptoms of anemia including chest pain and dyspnea on exertion. He is being followed closely with GI Dr. Deatra Ina. His EGD showed mild gastropathy. Colonoscopy in August 2014 demonstrated severe diverticulosis. Patient is on both vitamin B12 and iron.  Given his drop in hemoglobin, we will provide feraheme 510 mg today.  His creatinine clearance is 34.9 mL/min; he could also have some mild anemia of chronic kidney disease.  If no improvement, we will consider aranesp 300 mcg monthly.  His EPO level was 35.3 on 03/21/2014.   2. NASH complicated by cirrhosis, PBC.  --Followed by GI (Dr. Deatra Ina).   3. Atrial Fib/CHF/CAD.  --On Xarelto.  4. Cipro AE. --Patient reports reactions  including limb weakness when on Cipro for urinary tract infection.   5. Weight lost. --Patient reports receiving a CT of C/A/P due to weight lost.  It is as noted above. He will continue nutritional supplementation.   6. Follow up.  --He will have repeat labs in one month and then in 2 months with a follow up.  All questions were answered. The patient knows to call the clinic with any problems, questions or concerns. We can certainly see the patient much sooner if necessary.  I spent 15 minutes counseling the patient face to face. The total time spent in the appointment was 25 minutes.    Cory Salo, MD 06/16/2014 3:59 PM

## 2014-06-16 NOTE — Patient Instructions (Signed)

## 2014-06-21 ENCOUNTER — Other Ambulatory Visit: Payer: Self-pay | Admitting: Gastroenterology

## 2014-06-24 ENCOUNTER — Ambulatory Visit: Payer: Medicare PPO | Admitting: Emergency Medicine

## 2014-06-30 ENCOUNTER — Other Ambulatory Visit: Payer: Self-pay | Admitting: Emergency Medicine

## 2014-07-02 NOTE — Telephone Encounter (Signed)
Pt has appt sch 07/03/14.

## 2014-07-02 NOTE — Telephone Encounter (Signed)
Called in.

## 2014-07-03 ENCOUNTER — Encounter: Payer: Medicare PPO | Admitting: Emergency Medicine

## 2014-07-04 ENCOUNTER — Ambulatory Visit: Payer: Medicare PPO | Admitting: Emergency Medicine

## 2014-07-10 ENCOUNTER — Ambulatory Visit (INDEPENDENT_AMBULATORY_CARE_PROVIDER_SITE_OTHER): Payer: Medicare PPO | Admitting: Emergency Medicine

## 2014-07-10 ENCOUNTER — Encounter: Payer: Self-pay | Admitting: Emergency Medicine

## 2014-07-10 VITALS — BP 118/50 | HR 70 | Temp 97.8°F | Resp 16 | Ht 63.5 in | Wt 124.6 lb

## 2014-07-10 DIAGNOSIS — R634 Abnormal weight loss: Secondary | ICD-10-CM

## 2014-07-10 DIAGNOSIS — M2550 Pain in unspecified joint: Secondary | ICD-10-CM

## 2014-07-10 LAB — C-REACTIVE PROTEIN: CRP: 8 mg/dL — ABNORMAL HIGH (ref <0.60)

## 2014-07-10 LAB — SEDIMENTATION RATE: SED RATE: 73 mm/h — AB (ref 0–16)

## 2014-07-10 LAB — CK: Total CK: 39 U/L (ref 7–232)

## 2014-07-10 MED ORDER — PREDNISONE 10 MG PO TABS: ORAL_TABLET | ORAL | Status: DC

## 2014-07-10 MED ORDER — TRIAMCINOLONE ACETONIDE 0.1 % EX OINT: TOPICAL_OINTMENT | CUTANEOUS | Status: DC

## 2014-07-10 MED ORDER — ALPRAZOLAM 0.5 MG PO TABS: ORAL_TABLET | ORAL | Status: DC

## 2014-07-10 NOTE — Progress Notes (Signed)
Subjective:    Patient ID: Cory Lawrence, male    DOB: 08-20-1925, 80 y.o.   MRN: [id number]  This chart was scribed for [name], MD by Edison Simon, ED Scribe. This patient was seen in room 21 and the patient's care was started at 9:55 AM.   HPI  HPI Comments: Cory Lawrence is a 80 y.o. male who presents to the [facility] for follow up. He reports continued but decreased swelling in his hands, as well as heat and peeling. He states he is seeing a hand doctor tomorrow. He reports aching in other joints as well, especially in his knees and feet as well as his back and shoulders at times. He reports associated muscle stiffness. He denies shortness of breath, abdominal problems, or loss of appetite.  Review of Systems  Constitutional: Positive for unexpected weight change (loss). Negative for appetite change.  Respiratory: Negative for shortness of breath.   Gastrointestinal: Negative for nausea, vomiting and abdominal pain.  Musculoskeletal:       Joint pain. Swelling, heat, and peeling to hands. Stiffness       Objective:   Physical Exam  Nursing note and vitals reviewed. Constitutional: He is oriented to person, place, and time. He appears well-developed and well-nourished. No distress.  HENT:  Head: Normocephalic and atraumatic.  Eyes: Conjunctivae and EOM are normal.  Neck: Normal range of motion. Neck supple. No JVD present. No tracheal deviation present. No thyromegaly present.  Cardiovascular: Normal rate, regular rhythm and normal heart sounds.   No murmur heard. Pulmonary/Chest: Effort normal. He has no wheezes. He has no rales.  Decresed breath sounds in bases  Musculoskeletal:  Swelling to fingers of both hands with decreased ROM, no swellign to feet, pulses 2+  Lymphadenopathy:    He has no cervical adenopathy.  Neurological: He is alert and oriented to person, place, and time.  Skin: Skin is warm and dry.  Psychiatric: He has a  normal mood and affect.   Meds ordered this encounter  Medications  . ALPRAZolam (XANAX) 0.5 MG tablet    Sig: Try one half tablet at bedtime and if still unable to sleep take the other half tablet    Dispense:  30 tablet    Refill:  3    Not to exceed 5 additional fills before 11/29/2014  . triamcinolone ointment (KENALOG) 0.1 %    Sig: APPLY TO AFFECTED AREA TWICE A DAY    Dispense:  30 g    Refill:  5  . predniSONE (DELTASONE) 10 MG tablet    Sig: Take 2 tablets daily for the first 2 weeks then 1-1/2 tablets daily for 2 weeks then one tablet daily    Dispense:  60 tablet    Refill:  1         Assessment & Plan:  No source of cancer has been found. He continues to decline with weight loss. He has a history of anemia with a known elevated sedimentation rate in the 60s. I felt we should give him a trial of a low-dose prednisone. This certainly could fit into a polymyalgia rheumatica. Patient could also have a paraneoplastic syndrome. I'm going to try and put him on prednisone 20 mg a day for the first 2 weeks then 15 mg a day for 2 weeks then down to 10 mg a day to see if this has any effect on his weight and appetite. I'm going to check him in  2 weeks. He is due to see Dr. Phillip Heal make regarding his hands and discussed the severe reaction he had to the quinolones. His son has been transferred from the [place] part of the state to a correction facility in [place] and they are due to see him soon.

## 2014-07-13 ENCOUNTER — Encounter: Payer: Self-pay | Admitting: Family Medicine

## 2014-07-14 ENCOUNTER — Other Ambulatory Visit (HOSPITAL_BASED_OUTPATIENT_CLINIC_OR_DEPARTMENT_OTHER): Payer: Medicare PPO

## 2014-07-14 DIAGNOSIS — D649 Anemia, unspecified: Secondary | ICD-10-CM

## 2014-07-14 LAB — CBC WITH DIFFERENTIAL/PLATELET
BASO%: 0.3 % (ref 0.0–2.0)
Basophils Absolute: 0 10*3/uL (ref 0.0–0.1)
EOS%: 0.3 % (ref 0.0–7.0)
Eosinophils Absolute: 0 10*3/uL (ref 0.0–0.5)
HCT: 29.8 % — ABNORMAL LOW (ref 38.4–49.9)
HGB: 9.8 g/dL — ABNORMAL LOW (ref 13.0–17.1)
LYMPH%: 6.2 % — AB (ref 14.0–49.0)
MCH: 30.9 pg (ref 27.2–33.4)
MCHC: 32.7 g/dL (ref 32.0–36.0)
MCV: 94.5 fL (ref 79.3–98.0)
MONO#: 0.2 10*3/uL (ref 0.1–0.9)
MONO%: 2.6 % (ref 0.0–14.0)
NEUT%: 90.6 % — ABNORMAL HIGH (ref 39.0–75.0)
NEUTROS ABS: 8.6 10*3/uL — AB (ref 1.5–6.5)
Platelets: 224 10*3/uL (ref 140–400)
RBC: 3.16 10*6/uL — AB (ref 4.20–5.82)
RDW: 17.4 % — AB (ref 11.0–14.6)
WBC: 9.4 10*3/uL (ref 4.0–10.3)
lymph#: 0.6 10*3/uL — ABNORMAL LOW (ref 0.9–3.3)

## 2014-07-15 LAB — IRON AND TIBC CHCC
%SAT: 30 % (ref 20–55)
Iron: 59 ug/dL (ref 42–163)
TIBC: 198 ug/dL — ABNORMAL LOW (ref 202–409)
UIBC: 138 ug/dL (ref 117–376)

## 2014-07-15 LAB — FERRITIN CHCC: FERRITIN: 257 ng/mL (ref 22–316)

## 2014-07-22 ENCOUNTER — Ambulatory Visit (INDEPENDENT_AMBULATORY_CARE_PROVIDER_SITE_OTHER): Payer: Medicare PPO | Admitting: Emergency Medicine

## 2014-07-22 ENCOUNTER — Encounter: Payer: Self-pay | Admitting: Emergency Medicine

## 2014-07-22 VITALS — BP 127/64 | HR 69 | Temp 97.6°F | Resp 16 | Ht 64.0 in | Wt 124.4 lb

## 2014-07-22 DIAGNOSIS — Z23 Encounter for immunization: Secondary | ICD-10-CM

## 2014-07-22 DIAGNOSIS — R700 Elevated erythrocyte sedimentation rate: Secondary | ICD-10-CM

## 2014-07-22 NOTE — Progress Notes (Signed)
   Subjective:    Patient ID: Cory Lawrence, male    DOB: Nov 10, 1924, 80 y.o.   MRN: [id number] This chart was scribed for [name], MD by Zola Button, ED Scribe. This patient was seen in room Room/bed 22 and the patient's care was started at 11:47 AM.   HPI HPI Comments: Cory Lawrence is a 80 y.o. male who presents to the [facility] for a follow-up. Patient notes improvement within 3 days to swelling and pain with 20mg  prednisone, 2 tablets a day that he started 1-2 weeks ago. Per patient's wife, patient has been eating better, but has been losing weight. He also has associated congestion, according to his wife. Patient has been able to drive and see his friends at [employer].    Review of Systems  Constitutional: Positive for appetite change (increased appetite) and unexpected weight change (has been losing weight).  HENT: Positive for congestion.   Musculoskeletal: Negative for arthralgias.       Objective:   Physical Exam  CONSTITUTIONAL: Frail HEAD: Normocephalic/atraumatic EYES: EOM/PERRL ENMT: Mucous membranes moist NECK: supple no meningeal signs SPINE: entire spine nontender CV: S1/S2 noted, no murmurs/rubs/gallops noted LUNGS: Lungs are clear to auscultation bilaterally, no apparent distress ABDOMEN: soft, nontender, no rebound or guarding GU: no cva tenderness NEURO: Pt is awake/alert, moves all extremitiesx4 EXTREMITIES: pulses normal, full ROM, swelling and inflammation of hands, wrist and knees have resolved since being on prednisone SKIN: warm, color normal PSYCH: no abnormalities of mood noted, cooperative       Assessment & Plan:   Patient is significantly better on prednisone. We'll continue to taper down. I did advise him to go ahead and see the rheumatologist on Thursday and get their advice and opinion regarding a presumed diagnosis of polymyalgia rheumatica. He is dramatically better on prednisone. Will recheck 2 weeks  repeat sedimentation rate at that time.

## 2014-07-31 NOTE — Progress Notes (Signed)
This encounter was created in error - please disregard.

## 2014-08-05 ENCOUNTER — Ambulatory Visit: Payer: Medicare PPO | Admitting: Emergency Medicine

## 2014-08-11 ENCOUNTER — Encounter: Payer: Self-pay | Admitting: Hematology

## 2014-08-11 ENCOUNTER — Other Ambulatory Visit (HOSPITAL_BASED_OUTPATIENT_CLINIC_OR_DEPARTMENT_OTHER): Payer: Medicare PPO

## 2014-08-11 ENCOUNTER — Telehealth: Payer: Self-pay | Admitting: Hematology

## 2014-08-11 ENCOUNTER — Ambulatory Visit (HOSPITAL_BASED_OUTPATIENT_CLINIC_OR_DEPARTMENT_OTHER): Payer: Medicare PPO | Admitting: Hematology

## 2014-08-11 VITALS — BP 165/61 | HR 49 | Temp 97.5°F | Resp 18 | Ht 64.0 in | Wt 130.0 lb

## 2014-08-11 DIAGNOSIS — D638 Anemia in other chronic diseases classified elsewhere: Secondary | ICD-10-CM | POA: Diagnosis not present

## 2014-08-11 DIAGNOSIS — K7581 Nonalcoholic steatohepatitis (NASH): Secondary | ICD-10-CM

## 2014-08-11 DIAGNOSIS — D509 Iron deficiency anemia, unspecified: Secondary | ICD-10-CM

## 2014-08-11 DIAGNOSIS — D649 Anemia, unspecified: Secondary | ICD-10-CM

## 2014-08-11 LAB — CBC WITH DIFFERENTIAL/PLATELET
BASO%: 0 % (ref 0.0–2.0)
BASOS ABS: 0 10*3/uL (ref 0.0–0.1)
EOS%: 0.6 % (ref 0.0–7.0)
Eosinophils Absolute: 0 10*3/uL (ref 0.0–0.5)
HEMATOCRIT: 33.9 % — AB (ref 38.4–49.9)
HEMOGLOBIN: 11.4 g/dL — AB (ref 13.0–17.1)
LYMPH%: 8.7 % — AB (ref 14.0–49.0)
MCH: 32 pg (ref 27.2–33.4)
MCHC: 33.6 g/dL (ref 32.0–36.0)
MCV: 95.2 fL (ref 79.3–98.0)
MONO#: 0.2 10*3/uL (ref 0.1–0.9)
MONO%: 3.2 % (ref 0.0–14.0)
NEUT#: 6.3 10*3/uL (ref 1.5–6.5)
NEUT%: 87.5 % — AB (ref 39.0–75.0)
PLATELETS: 107 10*3/uL — AB (ref 140–400)
RBC: 3.56 10*6/uL — ABNORMAL LOW (ref 4.20–5.82)
RDW: 16.7 % — ABNORMAL HIGH (ref 11.0–14.6)
WBC: 7.2 10*3/uL (ref 4.0–10.3)
lymph#: 0.6 10*3/uL — ABNORMAL LOW (ref 0.9–3.3)

## 2014-08-11 LAB — BASIC METABOLIC PANEL (CC13)
Anion Gap: 8 mEq/L (ref 3–11)
BUN: 38.2 mg/dL — AB (ref 7.0–26.0)
CALCIUM: 8.8 mg/dL (ref 8.4–10.4)
CHLORIDE: 107 meq/L (ref 98–109)
CO2: 25 mEq/L (ref 22–29)
CREATININE: 0.9 mg/dL (ref 0.7–1.3)
Glucose: 109 mg/dl (ref 70–140)
Potassium: 4.4 mEq/L (ref 3.5–5.1)
Sodium: 140 mEq/L (ref 136–145)

## 2014-08-11 LAB — IRON AND TIBC CHCC
%SAT: 51 % (ref 20–55)
IRON: 123 ug/dL (ref 42–163)
TIBC: 243 ug/dL (ref 202–409)
UIBC: 119 ug/dL (ref 117–376)

## 2014-08-11 LAB — FERRITIN CHCC: Ferritin: 87 ng/ml (ref 22–316)

## 2014-08-11 NOTE — Telephone Encounter (Signed)
Pt confirmed labs/ov per 10/12 POF, gave pt AVS.... KJ °

## 2014-08-11 NOTE — Progress Notes (Signed)
[name] PROGRESS NOTE DATE OF VISIT: 08/11/2014  [name], MD [place] [address]  DIAGNOSIS: Iron deficiency anemia - Plan: CBC with Differential, Ferritin, Iron and TIBC [facility]  CURRENT TREATMENT: Ferrex 150 forte Plus twice daily  INTERVAL HISTORY:  Cory Lawrence 80 y.o. male  with history of CAD/CHF, paroxysmal atrial fibrillation on xalreto, DM2, primary biliary cirrhosis and NASH who was referred to our office for further evaluation of his anemia on 03/07/2014. He was last seen by Dr Juliann Mule on 06/16/2014.     Today, he is accompanied by his wife Cory Lawrence. He saw Dr. Everlene Farrier on 02/25/2014 and reportedly had a CXR the week prior. In addition, he was found to have a low hemoglobin of 10 with iron levels within normal range. He saw Dr. Jasmine December, a urologist, about four months and was prescribed finasteride for BPH with improvement in his symptoms of nocturia. Regarding his anemia work-up, he has been followed by Dr. Verl Blalock (now Dr. Deatra Ina) of GI with his last colonoscopy three years ago and at which time he had removal of multiple polyps and severe diverticulosis. Dr. Deatra Ina also follows him up for NASH complicated by cirrhosis (liver biopsy 11 years ago) and primary biliary cirrhosis with his last visit on 004/21/2015. He is been on Actigall 50 mg twice a day for years.   Abdominal ultrasound exam one year ago was unremarkable without evidence of ascites or portal hypertension. In the past, endoscopy showed evidence of mild portal gastropathy. Due to his anemia, he has been receiving the vitamin B12 shots over the past 2 years. He reports walking one mile a day. He has not done these walks over the past few weeks due to knee problems. His CBC on 02/18/2014 revealed a WBC of 6.9; Hemoglobin 10; Hematocrit 29.2; Platelets 153. His CMP revealed a creatinine of 1.09; normal liver vfunction tests. He also has COPD (prior smoker, quit 20  years ago, 40 pack years). He takes iron supplementation daily for the past one year. He denies hematochezia but reports dark stools.  His labs reviewed today and hemoglobin we see an improvement but a drop in platelets still above 100,000. Here is the trend:            MEDICAL HISTORY: Past Medical History  Diagnosis Date  . Dizziness   . Diaphoresis   . Palpitations   . COPD (chronic obstructive pulmonary disease)   . Hypertension   . Hyperlipidemia   . GERD (gastroesophageal reflux disease)   . BPH (benign prostatic hypertrophy)   . Personal history of colonic polyps 07/08/2002,06/2011    tubular adenoma, hyperplastic  . Internal hemorrhoids without mention of complication   . Diverticulosis of colon (without mention of hemorrhage)   . Biliary cirrhosis   . Unspecified gastritis and gastroduodenitis without mention of hemorrhage   . NASH (nonalcoholic steatohepatitis)   . Anxiety   . Arthritis   . Ulcer     Gastric/bleeding  . Diabetes mellitus type 2, diet-controlled     managed with diet  . Vitamin B12 deficiency   . PAF (paroxysmal atrial fibrillation)     on Xarelto  . CAD (coronary artery disease)     prior abnormal Myoview; subsequent cath; managed medically  . Heart attack     INTERIM HISTORY: has COLONIC POLYPS, ADENOMATOUS; HYPERLIPIDEMIA; OBESITY; HYPERTENSION; INTERNAL HEMORRHOIDS; COPD; GERD; DIVERTICULOSIS, COLON; BILIARY CIRRHOSIS, PRIMARY; BENIGN PROSTATIC HYPERTROPHY, HX OF; Syncope and collapse; Dizziness; Diaphoresis;  Chest pain; Chest tightness; Palpitations; COPD (chronic obstructive pulmonary disease); Hypertension; GERD (gastroesophageal reflux disease); BPH (benign prostatic hypertrophy); CAD (coronary artery disease); Other general symptoms ; NASH (nonalcoholic steatohepatitis); Cirrhosis of liver not due to alcohol ; Personal history of colonic polyps; Other B-complex deficiencies; Diabetes mellitus type 2, diet-controlled; Vitamin B12  deficiency; COPD with bronchitis; History of gastroesophageal reflux (GERD); Atrial Fibrillation; LV dysfunction; Weight loss; Anemia; and ESR raised on his problem list.    ALLERGIES:  is allergic to ciprofloxacin hcl and penicillins.  MEDICATIONS: has a current medication list which includes the following prescription(s): alprazolam, cyanocobalamin, diclofenac sodium, digoxin, doxazosin, ferrex 150 forte plus, finasteride, fluticasone-salmeterol, ipratropium-albuterol, isosorbide mononitrate, melatonin, multiple vitamins-minerals, fish oil, pantoprazole, prednisone, rivaroxaban, simvastatin, tamsulosin, triamcinolone ointment, and ursodiol.  SURGICAL HISTORY:  Past Surgical History  Procedure Laterality Date  . Cataract extraction      bilateral  . Eye surgery      left eye x multiple  . Cardiac catheterization  March 2013    Managed medically    REVIEW OF SYSTEMS:   Constitutional: Denies fevers, chills or abnormal weight loss Eyes: Denies blurriness of vision Ears, nose, mouth, throat, and face: Denies mucositis or sore throat Respiratory: Denies cough, dyspnea or wheezes Cardiovascular: Denies palpitation, chest discomfort or lower extremity swelling Gastrointestinal:  Denies nausea, heartburn or change in bowel habits Skin: Denies abnormal skin rashes Lymphatics: Denies new lymphadenopathy or easy bruising Neurological:Denies numbness, tingling or new weaknesses Behavioral/Psych: Mood is stable, no new changes  All other systems were reviewed with the patient and are negative.  PHYSICAL EXAMINATION: ECOG PERFORMANCE STATUS: 1  Blood pressure 165/61, pulse 49, temperature 97.5 F (36.4 C), resp. rate 18, height _0  (1.626 m), weight 130 lb (58.968 kg).  GENERAL:alert, no distress and comfortable; elderly male easily mobile to the exam.  SKIN: skin color, texture, turgor are normal, no rashes or significant lesions EYES: normal, Conjunctiva are pink and non-injected,  sclera clear OROPHARYNX:no exudate, no erythema and lips, buccal mucosa, and tongue normal  NECK: supple, thyroid normal size, non-tender, without nodularity LYMPH:  no palpable lymphadenopathy in the cervical, axillary or supraclavicular LUNGS: clear to auscultation with normal breathing effort, no wheezes or rhonchi HEART: regular rate & rhythm and no murmurs and no lower extremity edema ABDOMEN:abdomen soft, non-tender and normal bowel sounds Musculoskeletal:no cyanosis of digits and no clubbing  NEURO: alert & oriented x 3 with fluent speech, no focal motor/sensory deficits  Labs:     RADIOGRAPHIC STUDIES: (Personally reviewed by me) 05/16/2014 CT CHEST, ABDOMEN AND PELVIS WITHOUT CONTRAST TECHNIQUE: Multidetector CT imaging of the chest, abdomen and pelvis was performed following the standard protocol without IV contrast.  COMPARISON: None. FINDINGS: CT CHEST FINDINGS  No pneumothorax or pleural effusion is noted. Hyperexpansion of the lungs is noted suggesting chronic obstructive pulmonary disease. Minimal scarring is noted in both lung bases. Emphysematous bulla formation is noted in the right lung base. No acute pulmonary disease is noted. No pulmonary nodule or mass is noted. Atherosclerotic calcifications of thoracic aorta are noted without  aneurysm formation. Coronary artery calcifications are noted suggesting coronary artery disease. No significant mediastinal adenopathy or mass is noted. CT ABDOMEN AND PELVIS FINDINGS No gallstones are noted. No focal abnormality is noted in the liver, spleen or pancreas on these unenhanced images adrenal glands appear normal. 8.8 cm simple cyst arises from left kidney. 8.2 cm cyst  arises from right kidney. No hydronephrosis or renal obstruction is noted. No renal or ureteral  calculi are noted. Large amount of stool is noted in the right and transverse colon. There is no evidence of bowel obstruction. No abnormal fluid collection is noted.  Atherosclerotic calcifications of abdominal aorta and iliac arteries  are noted without aneurysm formation. At least 2 bladder  diverticulum are noted, with the largest measuring 5 cm in the right posterior portion of the urinary bladder. Bilateral inguinal hernias are noted. The right hernia contains a loop of small bowel, but does not result in obstruction. The left hernia contains a loop of sigmoid colon, but does not result in obstruction. No significant adenopathy is noted.  IMPRESSION: Hyperexpansion of the lungs is noted suggesting chronic obstructive pulmonary disease.  Coronary artery calcifications are noted suggesting coronary artery disease. Large bilateral simple renal cysts are noted. No hydronephrosis or renal obstruction is noted. No renal or ureteral calculi are noted. Large amount of stool is noted in the right and transverse colon which potentially may represent constipation. 5 cm bladder diverticulum is noted to the right and posterior to the urinary bladder. Bilateral inguinal hernias are noted, with the right inguinal hernia  containing a loop of small bowel without obstruction. The left  inguinal hernia contains a loop of sigmoid colon, also without  obstruction.   ASSESSMENT: Cory Lawrence 80 y.o. male with a history of Iron deficiency anemia - Plan: CBC with Differential, Ferritin, Iron and TIBC [facility]   PLAN:   1. Anemia NOS.  -- His hemoglobin on today is 11.4 up from 9.3.  His  Hemoglobin on 02/25/2014 was 10.4.  His Iron is 123, with a ferritin of 87. He has a normocytic anemia of chronic disease. He denies symptoms of anemia including chest pain and dyspnea on exertion. He is being followed closely with GI Dr. Deatra Ina. His EGD showed mild gastropathy. Colonoscopy in August 2014 demonstrated severe diverticulosis. Patient is on both vitamin B12 and iron.  Given his drop in hemoglobin, he got  feraheme 510 mg on 06/16/2014. He could also have some mild anemia of chronic  kidney disease.  If no improvement, we will consider aranesp 300 mcg monthly.  His EPO level was 35.3 on 03/21/2014.   2. NASH complicated by cirrhosis, PBC.  --Followed by GI (Dr. Deatra Ina).   3. Atrial Fib/CHF/CAD.  --On Xarelto. Might have contributed to thrombocytopenia  4. Cipro AE. --Patient reports reactions including limb weakness when on Cipro for urinary tract infection.   5. Weight lost. --Patient reports receiving a CT of C/A/P due to weight lost.  It is as noted above. He will continue nutritional supplementation.   6. Follow up.  --He will have repeat labs in one 6 weeks.  All questions were answered. The patient knows to call the clinic with any problems, questions or concerns. We can certainly see the patient much sooner if necessary.  I spent 15 minutes counseling the patient face to face. The total time spent in the appointment was 25 minutes.    Bernadene Bell, MD Medical Hematologist/Oncologist [facility] Pager: [phone] Office No: [phone]

## 2014-08-26 ENCOUNTER — Encounter: Payer: Self-pay | Admitting: Cardiovascular Disease

## 2014-08-26 ENCOUNTER — Ambulatory Visit (INDEPENDENT_AMBULATORY_CARE_PROVIDER_SITE_OTHER): Payer: Medicare PPO | Admitting: Cardiovascular Disease

## 2014-08-26 VITALS — BP 120/62 | HR 71 | Ht 64.0 in | Wt 131.0 lb

## 2014-08-26 DIAGNOSIS — I480 Paroxysmal atrial fibrillation: Secondary | ICD-10-CM

## 2014-08-26 DIAGNOSIS — I2510 Atherosclerotic heart disease of native coronary artery without angina pectoris: Secondary | ICD-10-CM

## 2014-08-26 NOTE — Assessment & Plan Note (Signed)
He remained stable. He's not had any episodes of angina.

## 2014-08-26 NOTE — Assessment & Plan Note (Addendum)
He has maintained sinus rhythm. Continue same medications

## 2014-08-26 NOTE — Patient Instructions (Addendum)
Your physician recommends that you continue on your current medications as directed. Please refer to the Current Medication list given to you today.  Your physician wants you to follow-up in: 1 year with Dr. Acie Fredrickson.  You will receive a reminder letter in the mail two months in advance. If you don't receive a letter, please call our office to schedule the follow-up appointment. Your physician recommends that you return for lab work in: 12 months on the day of or a few days before your office visit with Dr. Acie Fredrickson.  You will need to FAST for this appointment - nothing to eat or drink after midnight the night before except water.

## 2014-08-26 NOTE — Progress Notes (Signed)
Cory Lawrence Date of Birth  [birth year]       [facility]    [employer] [address], [place], [place]  [address]   [place], [place]  [address] [phone]     [phone]   Fax  [phone]    Fax [phone]  Problem List: 1. Coronary artery disease-chronic total occlusion of the right coronary artery with intact left to right collateral filling.    medical therapy 2. Paroxysmal atrial fibrillation 3. Diabetes mellitus 4. Hypertension 5. Tendonitis due to Cipro   History of Present Illness:  Cory Lawrence is an 80 year old gentleman with a history of moderate coronary artery disease. We've been treating him medically. He also has had some paroxysmal atrial fibrillation. There'll seems to be getting along a little bit better.  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    May 30, 2013:   Cory Lawrence is doing well.  No CP  Feb. 11, 2015:  Cory Lawrence is doing ok.  He is losing weight.  He has lost 50 lbs over the past several years.  His last TSH level was drawn 2 years ago. No CP or dyspnea. Does have a hx of renal cell cancer.    Oct. 27, 2015:  Doing well from a cardiac standpoint.  Has just gotten over a severe episode diffuse tendinitis caused by ciprofloxacin.  This was diagnosed by Dr. Amedeo Plenty.  He has been back doing his normal activities - currently working on his truck   Current Outpatient Prescriptions on File Prior to Visit  Medication Sig Dispense Refill  . ALPRAZolam (XANAX) 0.5 MG tablet Try one half tablet at bedtime and if still unable to sleep take the other half tablet  30 tablet  3  . cyanocobalamin (,VITAMIN B-12,) 1000 MCG/ML injection INJECT 1 ML (1,000 MCG TOTAL) INTO THE MUSCLE EVERY 30 (THIRTY) DAYS.  10 mL  6  . diclofenac sodium (VOLTAREN) 1 % GEL Apply 2 g topically 3 (three) times daily.  100 g  2  . digoxin (LANOXIN) 0.125 MG tablet TAKE 1 TABLET BY MOUTH DAILY  90 tablet  3   . doxazosin (CARDURA) 4 MG tablet TAKE 1 TABLET AT BEDTIME  30 tablet  11  . Fe-Succ Ac-C-Thre Ac-B12-FA (FERREX 150 FORTE PLUS) 50-100 MG CAPS Take 1 capsule by mouth daily.  90 each  3  . finasteride (PROSCAR) 5 MG tablet Take 5 mg by mouth daily.       . Fluticasone-Salmeterol (ADVAIR DISKUS) 250-50 MCG/DOSE AEPB Inhale 1 puff into the lungs as needed. Shortness of breath.      . Ipratropium-Albuterol (COMBIVENT IN) Inhale 1 puff into the lungs as needed. Shortness of breath.      . isosorbide mononitrate (IMDUR) 30 MG 24 hr tablet TAKE 1 TABLET (30 MG TOTAL) BY MOUTH DAILY.  30 tablet  11  . Melatonin 3 MG CAPS Take 0.75 mg by mouth at bedtime as needed. Sleep       . Omega-3 Fatty Acids (FISH OIL) 1000 MG CAPS Take 1,000 mg by mouth daily.       . pantoprazole (PROTONIX) 40 MG tablet Take 1 tablet (40 mg total) by mouth daily.  90 tablet  3  . predniSONE (DELTASONE) 10 MG tablet Take 2 tablets daily for the first 2 weeks then 1-1/2 tablets daily for 2 weeks then one tablet daily  60 tablet  1  .  Rivaroxaban (XARELTO) 15 MG TABS tablet TAKE 1 TABLET DAILY  90 tablet  3  . simvastatin (ZOCOR) 40 MG tablet TAKE 1 TABLET BY MOUTH EVERY DAY AT BEDTIME  90 tablet  1  . tamsulosin (FLOMAX) 0.4 MG CAPS capsule Take 0.4 mg by mouth.      . triamcinolone ointment (KENALOG) 0.1 % APPLY TO AFFECTED AREA TWICE A DAY  30 g  5  . ursodiol (ACTIGALL) 250 MG tablet TAKE 1 TABLET TWICE DAILY  180 tablet  1   No current facility-administered medications on file prior to visit.    Allergies  Allergen Reactions  . Ciprofloxacin Hcl Swelling  . Penicillins Hives    Past Medical History  Diagnosis Date  . Dizziness   . Diaphoresis   . Palpitations   . COPD (chronic obstructive pulmonary disease)   . Hypertension   . Hyperlipidemia   . GERD (gastroesophageal reflux disease)   . BPH (benign prostatic hypertrophy)   . Personal history of colonic polyps 07/08/2002,06/2011    tubular adenoma,  hyperplastic  . Internal hemorrhoids without mention of complication   . Diverticulosis of colon (without mention of hemorrhage)   . Biliary cirrhosis   . Unspecified gastritis and gastroduodenitis without mention of hemorrhage   . NASH (nonalcoholic steatohepatitis)   . Anxiety   . Arthritis   . Ulcer     Gastric/bleeding  . Diabetes mellitus type 2, diet-controlled     managed with diet  . Vitamin B12 deficiency   . PAF (paroxysmal atrial fibrillation)     on Xarelto  . CAD (coronary artery disease)     prior abnormal Myoview; subsequent cath; managed medically  . Heart attack     Past Surgical History  Procedure Laterality Date  . Cataract extraction      bilateral  . Eye surgery      left eye x multiple  . Cardiac catheterization  March 2013    Managed medically    History  Smoking status  . Former Smoker  Smokeless tobacco  . Former Systems developer  . Quit date: 05/31/1996    History  Alcohol Use No    Family History  Problem Relation Age of Onset  . Heart failure Mother   . Diabetes Mother   . Diabetes Sister   . Heart failure Sister   . Colon cancer Neg Hx   . Cancer Mother     unknown type    Reviw of Systems:  Reviewed in the HPI.  All other systems are negative.  Physical Exam: Blood pressure 120/62, pulse 71, height 5\' 4"  (1.626 m), weight 131 lb (59.421 kg). General: Well developed, well nourished, in no acute distress. Head: Normocephalic, atraumatic, sclera non-icteric, mucus membranes are moist,  Neck: Supple. Carotids are 2 + without bruits. No JVD Lungs: Clear bilaterally to auscultation. Heart: regular rate.  normal  S1 S2. No murmurs, gallops or rubs. Abdomen: Soft, non-tender, non-distended with normal bowel sounds. No hepatomegaly. No rebound/guarding. No masses. Msk:  Strength and tone are normal Extremities: No clubbing or cyanosis. No edema.  Distal pedal pulses are 2+ and equal bilaterally. Neuro: Alert and oriented X 3. Moves all  extremities spontaneously. Psych:  Responds to questions appropriately with a normal affect.  ECG: Oct. 27, 2015:  NSR at 71,  Occasional PVCs and PACs  Assessment / Plan:

## 2014-08-28 ENCOUNTER — Ambulatory Visit: Payer: Medicare PPO | Admitting: Emergency Medicine

## 2014-09-01 ENCOUNTER — Encounter: Payer: Self-pay | Admitting: Emergency Medicine

## 2014-09-01 ENCOUNTER — Ambulatory Visit (INDEPENDENT_AMBULATORY_CARE_PROVIDER_SITE_OTHER): Payer: Medicare PPO | Admitting: Emergency Medicine

## 2014-09-01 VITALS — BP 160/60 | HR 83 | Temp 97.7°F | Resp 16 | Ht 63.5 in | Wt 129.6 lb

## 2014-09-01 DIAGNOSIS — Z23 Encounter for immunization: Secondary | ICD-10-CM

## 2014-09-01 DIAGNOSIS — M353 Polymyalgia rheumatica: Secondary | ICD-10-CM

## 2014-09-01 DIAGNOSIS — J441 Chronic obstructive pulmonary disease with (acute) exacerbation: Secondary | ICD-10-CM

## 2014-09-01 DIAGNOSIS — R634 Abnormal weight loss: Secondary | ICD-10-CM

## 2014-09-01 LAB — CBC WITH DIFFERENTIAL/PLATELET
Basophils Absolute: 0 10*3/uL (ref 0.0–0.1)
Basophils Relative: 0 % (ref 0–1)
Eosinophils Absolute: 0.1 10*3/uL (ref 0.0–0.7)
Eosinophils Relative: 1 % (ref 0–5)
HCT: 34.4 % — ABNORMAL LOW (ref 39.0–52.0)
HEMOGLOBIN: 12.2 g/dL — AB (ref 13.0–17.0)
LYMPHS ABS: 1.1 10*3/uL (ref 0.7–4.0)
LYMPHS PCT: 17 % (ref 12–46)
MCH: 31.9 pg (ref 26.0–34.0)
MCHC: 35.5 g/dL (ref 30.0–36.0)
MCV: 90.1 fL (ref 78.0–100.0)
Monocytes Absolute: 0.3 10*3/uL (ref 0.1–1.0)
Monocytes Relative: 4 % (ref 3–12)
NEUTROS PCT: 78 % — AB (ref 43–77)
Neutro Abs: 5.1 10*3/uL (ref 1.7–7.7)
Platelets: 150 10*3/uL (ref 150–400)
RBC: 3.82 MIL/uL — AB (ref 4.22–5.81)
RDW: 16.5 % — ABNORMAL HIGH (ref 11.5–15.5)
WBC: 6.6 10*3/uL (ref 4.0–10.5)

## 2014-09-01 LAB — C-REACTIVE PROTEIN: CRP: <0.5 mg/dL (ref <0.60)

## 2014-09-01 MED ORDER — PREDNISONE 10 MG PO TABS: 10.0000 mg | ORAL_TABLET | Freq: Every day | ORAL | Status: DC

## 2014-09-01 NOTE — Progress Notes (Signed)
   Subjective:    Patient ID: Cory Lawrence, male    DOB: 1925/09/01, 80 y.o.   MRN: [id number]  HPIpatient here for follow-up of presumed polymyalgia rheumatica. He also had a significant reaction to quinolones. He is currently on 10 mg prednisone a day and feels great. His appetite has improved. His joint pain has resolved.    Review of Systems     Objective:   Physical Exam  Constitutional: He appears well-developed and well-nourished.  HENT:  Head: Normocephalic.  Eyes: Pupils are equal, round, and reactive to light.  Neck: No thyromegaly present.  Cardiovascular: Normal rate and regular rhythm.   Pulmonary/Chest: Effort normal.  There are decreased breath sounds in both bases with an increased AP diameter  Abdominal: Soft. Bowel sounds are normal.          Assessment & Plan:  Patient looks great. His flu shot was given. He will continue prednisone 10 a day. His sedimentation rate CBC and CRP were repeated.

## 2014-09-02 LAB — SEDIMENTATION RATE: SED RATE: 5 mm/h (ref 0–16)

## 2014-09-02 NOTE — Progress Notes (Signed)
Call patient and tell him the hematology office we'll discuss this with him at his next office visit

## 2014-09-22 ENCOUNTER — Ambulatory Visit: Payer: Medicare PPO

## 2014-09-22 ENCOUNTER — Other Ambulatory Visit: Payer: Medicare PPO

## 2014-09-22 ENCOUNTER — Telehealth: Payer: Self-pay | Admitting: Hematology

## 2014-09-22 NOTE — Telephone Encounter (Signed)
Wife lvm and was going to r/s appt. Returned call and spoke w/ Cardale and he will have wife call and find a appt d/t to r/s to.

## 2014-10-07 ENCOUNTER — Encounter: Payer: Self-pay | Admitting: Emergency Medicine

## 2014-10-07 ENCOUNTER — Ambulatory Visit (INDEPENDENT_AMBULATORY_CARE_PROVIDER_SITE_OTHER): Payer: Medicare PPO | Admitting: Emergency Medicine

## 2014-10-07 VITALS — BP 160/64 | HR 60 | Temp 98.7°F | Resp 18 | Ht 63.0 in | Wt 130.0 lb

## 2014-10-07 DIAGNOSIS — J4489 Other specified chronic obstructive pulmonary disease: Secondary | ICD-10-CM

## 2014-10-07 DIAGNOSIS — J449 Chronic obstructive pulmonary disease, unspecified: Secondary | ICD-10-CM

## 2014-10-07 DIAGNOSIS — Z23 Encounter for immunization: Secondary | ICD-10-CM

## 2014-10-07 DIAGNOSIS — M353 Polymyalgia rheumatica: Secondary | ICD-10-CM

## 2014-10-07 DIAGNOSIS — R700 Elevated erythrocyte sedimentation rate: Secondary | ICD-10-CM

## 2014-10-07 LAB — CBC WITH DIFFERENTIAL/PLATELET
BASOS PCT: 0 % (ref 0–1)
Basophils Absolute: 0 10*3/uL (ref 0.0–0.1)
Eosinophils Absolute: 0 10*3/uL (ref 0.0–0.7)
Eosinophils Relative: 0 % (ref 0–5)
HCT: 32.8 % — ABNORMAL LOW (ref 39.0–52.0)
Hemoglobin: 11.5 g/dL — ABNORMAL LOW (ref 13.0–17.0)
Lymphocytes Relative: 9 % — ABNORMAL LOW (ref 12–46)
Lymphs Abs: 0.9 10*3/uL (ref 0.7–4.0)
MCH: 32.2 pg (ref 26.0–34.0)
MCHC: 35.1 g/dL (ref 30.0–36.0)
MCV: 91.9 fL (ref 78.0–100.0)
MONO ABS: 0.6 10*3/uL (ref 0.1–1.0)
MONOS PCT: 6 % (ref 3–12)
MPV: 9.8 fL (ref 9.4–12.4)
Neutro Abs: 8.2 10*3/uL — ABNORMAL HIGH (ref 1.7–7.7)
Neutrophils Relative %: 85 % — ABNORMAL HIGH (ref 43–77)
Platelets: 219 10*3/uL (ref 150–400)
RBC: 3.57 MIL/uL — ABNORMAL LOW (ref 4.22–5.81)
RDW: 14.9 % (ref 11.5–15.5)
WBC: 9.7 10*3/uL (ref 4.0–10.5)

## 2014-10-07 MED ORDER — ISOSORBIDE MONONITRATE ER 30 MG PO TB24: ORAL_TABLET | ORAL | Status: DC

## 2014-10-07 MED ORDER — DOXAZOSIN MESYLATE 4 MG PO TABS: ORAL_TABLET | ORAL | Status: DC

## 2014-10-07 MED ORDER — TAMSULOSIN HCL 0.4 MG PO CAPS: 0.4000 mg | ORAL_CAPSULE | Freq: Every day | ORAL | Status: DC

## 2014-10-07 MED ORDER — FINASTERIDE 5 MG PO TABS: 5.0000 mg | ORAL_TABLET | Freq: Every day | ORAL | Status: AC

## 2014-10-07 MED ORDER — RIVAROXABAN 15 MG PO TABS: ORAL_TABLET | ORAL | Status: DC

## 2014-10-07 MED ORDER — PANTOPRAZOLE SODIUM 40 MG PO TBEC: 40.0000 mg | DELAYED_RELEASE_TABLET | Freq: Every day | ORAL | Status: DC

## 2014-10-07 MED ORDER — URSODIOL 250 MG PO TABS: 250.0000 mg | ORAL_TABLET | Freq: Two times a day (BID) | ORAL | Status: DC

## 2014-10-07 MED ORDER — DIGOXIN 125 MCG PO TABS: ORAL_TABLET | ORAL | Status: DC

## 2014-10-07 NOTE — Patient Instructions (Signed)
Use Advair every day. Combivent is as needed

## 2014-10-07 NOTE — Progress Notes (Signed)
Subjective:  This chart was scribed for [name], MD by Donato Schultz, Medical Scribe. This patient was seen in Room 22 and the patient's care was started at 1:50 PM.   Patient ID: Cory Lawrence, male    DOB: September 09, 1925, 80 y.o.   MRN: [id number]  HPI HPI Comments: Cory Lawrence is a 80 y.o. male who presents to the [facility] for a medication refill.  He states that he is doing well and his wife asserts that the Prednisone has helped increase his appetite.  He has not fallen recently but he refuses to use his cane to help him walk.  When he decreased his Prednisone dosage to 5mg  and after a week of taking the medication, he started experiencing tremors, weakness, and joint pain.  Upon onset of his symptoms, his wife immediately gave him a 10mg  dose of Prednisone and after three days, his symptoms subsided.  He has already gotten his flu shot and pneumonia vaccine this year.    Past Medical History  Diagnosis Date  . Dizziness   . Diaphoresis   . Palpitations   . COPD (chronic obstructive pulmonary disease)   . Hypertension   . Hyperlipidemia   . GERD (gastroesophageal reflux disease)   . BPH (benign prostatic hypertrophy)   . Personal history of colonic polyps 07/08/2002,06/2011    tubular adenoma, hyperplastic  . Internal hemorrhoids without mention of complication   . Diverticulosis of colon (without mention of hemorrhage)   . Biliary cirrhosis   . Unspecified gastritis and gastroduodenitis without mention of hemorrhage   . NASH (nonalcoholic steatohepatitis)   . Anxiety   . Arthritis   . Ulcer     Gastric/bleeding  . Diabetes mellitus type 2, diet-controlled     managed with diet  . Vitamin B12 deficiency   . PAF (paroxysmal atrial fibrillation)     on Xarelto  . CAD (coronary artery disease)     prior abnormal Myoview; subsequent cath; managed medically  . Heart attack    Past Surgical History  Procedure Laterality Date  . Cataract  extraction      bilateral  . Eye surgery      left eye x multiple  . Cardiac catheterization  March 2013    Managed medically   Family History  Problem Relation Age of Onset  . Heart failure Mother   . Diabetes Mother   . Diabetes Sister   . Heart failure Sister   . Colon cancer Neg Hx   . Cancer Mother     unknown type   History   Social History  . Marital Status: Married    Spouse Name: N/A    Number of Children: 2  . Years of Education: N/A   Occupational History  . retired    Social History Main Topics  . Smoking status: Former Research scientist (life sciences)  . Smokeless tobacco: Former Systems developer    Quit date: 05/31/1996  . Alcohol Use: No  . Drug Use: No  . Sexual Activity: No   Other Topics Concern  . Not on file   Social History Narrative   Allergies  Allergen Reactions  . Ciprofloxacin Hcl Swelling  . Penicillins Hives    Review of Systems    Objective:  Physical Exam  Constitutional: He is oriented to person, place, and time. He appears well-developed and well-nourished.  He is a frail, elderly gentleman who is not in any distress.  HENT:  Head: Normocephalic and atraumatic.  Eyes: EOM are normal.  Neck: Normal range of motion.  Cardiovascular: Normal rate, regular rhythm and normal heart sounds.   No murmur heard. Pulmonary/Chest: Effort normal. No respiratory distress. He has decreased breath sounds. He has no wheezes. He has no rales.  He has a barrel chest deformity with decreased breath sounds in both bases.  Abdominal: Soft. There is no tenderness.  Musculoskeletal: Normal range of motion. He exhibits no edema.  No swelling over the hands, knees, or ankles bilaterally.  Neurological: He is alert and oriented to person, place, and time.  Skin: Skin is warm and dry.  Psychiatric: He has a normal mood and affect. His behavior is normal.  Nursing note and vitals reviewed.    BP 160/64 mmHg  Pulse 60  Temp(Src) 98.7 F (37.1 C)  Resp 18  Ht 5\' 3"  (1.6 m)  Wt  130 lb (58.968 kg)  BMI 23.03 kg/m2  SpO2 97% Assessment & Plan:  Patient did not tolerate a decrease Prednisone dose to 5mg .  When his wife reinstituted 10mg  a day he did remarkably better.  He is now back to baseline.  Will continue 10mg  for the next 6 weeks and then begin a slow taper every 4-6 weeks.  Prevnar vaccine given today. I personally performed the services described in this documentation, which was scribed in my presence. The recorded information has been reviewed and is accurate.

## 2014-10-08 LAB — SEDIMENTATION RATE: Sed Rate: 8 mm/hr (ref 0–16)

## 2014-11-20 ENCOUNTER — Encounter: Payer: Self-pay | Admitting: Emergency Medicine

## 2014-11-20 ENCOUNTER — Ambulatory Visit (INDEPENDENT_AMBULATORY_CARE_PROVIDER_SITE_OTHER): Payer: Medicare PPO | Admitting: Emergency Medicine

## 2014-11-20 VITALS — BP 170/60 | HR 71 | Temp 97.9°F | Resp 16 | Ht 63.0 in | Wt 129.8 lb

## 2014-11-20 DIAGNOSIS — R700 Elevated erythrocyte sedimentation rate: Secondary | ICD-10-CM

## 2014-11-20 DIAGNOSIS — J449 Chronic obstructive pulmonary disease, unspecified: Secondary | ICD-10-CM

## 2014-11-20 DIAGNOSIS — J4489 Other specified chronic obstructive pulmonary disease: Secondary | ICD-10-CM

## 2014-11-20 DIAGNOSIS — R634 Abnormal weight loss: Secondary | ICD-10-CM

## 2014-11-20 DIAGNOSIS — M353 Polymyalgia rheumatica: Secondary | ICD-10-CM

## 2014-11-20 LAB — CBC WITH DIFFERENTIAL/PLATELET
BASOS PCT: 0 % (ref 0–1)
Basophils Absolute: 0 10*3/uL (ref 0.0–0.1)
Eosinophils Absolute: 0.1 10*3/uL (ref 0.0–0.7)
Eosinophils Relative: 1 % (ref 0–5)
HCT: 33.5 % — ABNORMAL LOW (ref 39.0–52.0)
Hemoglobin: 11.5 g/dL — ABNORMAL LOW (ref 13.0–17.0)
Lymphocytes Relative: 19 % (ref 12–46)
Lymphs Abs: 1.9 10*3/uL (ref 0.7–4.0)
MCH: 32.4 pg (ref 26.0–34.0)
MCHC: 34.3 g/dL (ref 30.0–36.0)
MCV: 94.4 fL (ref 78.0–100.0)
MPV: 9.5 fL (ref 8.6–12.4)
Monocytes Absolute: 0.5 10*3/uL (ref 0.1–1.0)
Monocytes Relative: 5 % (ref 3–12)
NEUTROS PCT: 75 % (ref 43–77)
Neutro Abs: 7.6 10*3/uL (ref 1.7–7.7)
Platelets: 149 10*3/uL — ABNORMAL LOW (ref 150–400)
RBC: 3.55 MIL/uL — ABNORMAL LOW (ref 4.22–5.81)
RDW: 15.3 % (ref 11.5–15.5)
WBC: 10.1 10*3/uL (ref 4.0–10.5)

## 2014-11-20 MED ORDER — PREDNISONE 1 MG PO TABS: ORAL_TABLET | ORAL | Status: DC

## 2014-11-20 MED ORDER — PREDNISONE 5 MG PO TABS: ORAL_TABLET | ORAL | Status: DC

## 2014-11-20 NOTE — Progress Notes (Addendum)
Subjective:  This chart was scribed for [name], MD by Tamsen Roers, at [facility].  This patient was seen in room 22 and the patient's care was started at 12:12 PM.     Patient ID: Cory Lawrence, male    DOB: 1925/03/02, 81 y.o.   MRN: [id number]  HPI  HPI Comments: Cory Lawrence is a 81 y.o. male who presents to [facility] for follow up after severs adverse reaction from Cipro.  Patient under treatment for polymyalgia rhuemitica and markedly better on prednisone. Patient notes that he has no loss of appetite and is eating well/stays hungry.  He notes that he has pain in his hands at times but is able to use them however he wishes.  Notes he is not having any difficulty breathing from the cold weather.  Patient is on 10 mg 1X a day Prednisone. Patient had Prevnar shot last visit.   Left arm blood pressure: 160/60 Right arm blood pressure:  160/60    Patient Active Problem List   Diagnosis Date Noted  . ESR raised 07/22/2014  . Anemia 02/18/2014  . Weight loss 12/11/2013  . LV dysfunction 03/02/2012  . Atrial Fibrillation 02/22/2012  . Vitamin B12 deficiency 09/29/2011  . COPD with bronchitis 09/29/2011  . History of gastroesophageal reflux (GERD) 09/29/2011  . Diabetes mellitus type 2, diet-controlled   . Other B-complex deficiencies 05/24/2011  . Other general symptoms  05/17/2011  . NASH (nonalcoholic steatohepatitis) 05/17/2011  . Cirrhosis of liver not due to alcohol  05/17/2011  . Personal history of colonic polyps 05/17/2011  . CAD (coronary artery disease) 03/17/2011  . Syncope and collapse   . Dizziness   . Diaphoresis   . Chest pain   . Chest tightness   . Palpitations   . COPD (chronic obstructive pulmonary disease)   . Hypertension   . GERD (gastroesophageal reflux disease)   . BPH (benign prostatic hypertrophy)   . HYPERLIPIDEMIA 11/19/2007  . OBESITY 11/19/2007  . HYPERTENSION 11/19/2007    . INTERNAL HEMORRHOIDS 11/19/2007  . COPD 11/19/2007  . GERD 11/19/2007  . DIVERTICULOSIS, COLON 11/19/2007  . BILIARY CIRRHOSIS, PRIMARY 11/19/2007  . BENIGN PROSTATIC HYPERTROPHY, HX OF 11/19/2007  . COLONIC POLYPS, ADENOMATOUS 07/08/2002   Past Medical History  Diagnosis Date  . Dizziness   . Diaphoresis   . Palpitations   . COPD (chronic obstructive pulmonary disease)   . Hypertension   . Hyperlipidemia   . GERD (gastroesophageal reflux disease)   . BPH (benign prostatic hypertrophy)   . Personal history of colonic polyps 07/08/2002,06/2011    tubular adenoma, hyperplastic  . Internal hemorrhoids without mention of complication   . Diverticulosis of colon (without mention of hemorrhage)   . Biliary cirrhosis   . Unspecified gastritis and gastroduodenitis without mention of hemorrhage   . NASH (nonalcoholic steatohepatitis)   . Anxiety   . Arthritis   . Ulcer     Gastric/bleeding  . Diabetes mellitus type 2, diet-controlled     managed with diet  . Vitamin B12 deficiency   . PAF (paroxysmal atrial fibrillation)     on Xarelto  . CAD (coronary artery disease)     prior abnormal Myoview; subsequent cath; managed medically  . Heart attack    Past Surgical History  Procedure Laterality Date  . Cataract extraction      bilateral  . Eye surgery      left eye x multiple  .  Cardiac catheterization  March 2013    Managed medically   Allergies  Allergen Reactions  . Ciprofloxacin Hcl Swelling  . Penicillins Hives   Prior to Admission medications   Medication Sig Start Date End Date Taking? Authorizing Provider  ALPRAZolam Duanne Moron) 0.5 MG tablet Try one half tablet at bedtime and if still unable to sleep take the other half tablet 07/10/14   [name], MD  cyanocobalamin (,VITAMIN B-12,) 1000 MCG/ML injection INJECT 1 ML (1,000 MCG TOTAL) INTO THE MUSCLE EVERY 30 (THIRTY) DAYS. 02/25/14   Inda Castle, MD  diclofenac sodium (VOLTAREN) 1 % GEL Apply 2 g topically 3  (three) times daily. 05/20/14   [name], MD  digoxin (LANOXIN) 0.125 MG tablet TAKE 1 TABLET BY MOUTH DAILY 10/07/14   [name], MD  doxazosin (CARDURA) 4 MG tablet TAKE 1 TABLET AT BEDTIME 10/07/14   [name], MD  Fe-Succ Ac-C-Thre Ac-B12-FA (FERREX 150 FORTE PLUS) 50-100 MG CAPS Take 1 capsule by mouth daily. 11/19/12   Sable Feil, MD  finasteride (PROSCAR) 5 MG tablet Take 1 tablet (5 mg total) by mouth daily. 10/07/14   [name], MD  Fluticasone-Salmeterol (ADVAIR DISKUS) 250-50 MCG/DOSE AEPB Inhale 1 puff into the lungs as needed. Shortness of breath.    Historical Provider, MD  Ipratropium-Albuterol (COMBIVENT IN) Inhale 1 puff into the lungs as needed. Shortness of breath.    Historical Provider, MD  isosorbide mononitrate (IMDUR) 30 MG 24 hr tablet TAKE 1 TABLET (30 MG TOTAL) BY MOUTH DAILY. 10/07/14   [name], MD  Melatonin 3 MG CAPS Take 0.75 mg by mouth at bedtime as needed. Sleep     Historical Provider, MD  Omega-3 Fatty Acids (FISH OIL) 1000 MG CAPS Take 1,000 mg by mouth daily.     Historical Provider, MD  pantoprazole (PROTONIX) 40 MG tablet Take 1 tablet (40 mg total) by mouth daily. 10/07/14   [name], MD  predniSONE (DELTASONE) 10 MG tablet Take 1 tablet (10 mg total) by mouth daily with breakfast. 09/01/14   [name], MD  Rivaroxaban (XARELTO) 15 MG TABS tablet TAKE 1 TABLET DAILY 10/07/14   [name], MD  simvastatin (ZOCOR) 40 MG tablet TAKE 1 TABLET BY MOUTH EVERY DAY AT BEDTIME 06/07/14   Collene Leyden, PA-C  tamsulosin (FLOMAX) 0.4 MG CAPS capsule Take 1 capsule (0.4 mg total) by mouth daily after breakfast. 10/07/14   [name], MD  triamcinolone ointment (KENALOG) 0.1 % APPLY TO AFFECTED AREA TWICE A DAY 07/10/14   [name], MD  ursodiol (ACTIGALL) 250 MG tablet Take 1 tablet (250 mg total) by mouth 2 (two) times daily. 10/07/14   [name], MD   History   Social History  . Marital Status: Married    Spouse Name: N/A      Number of Children: 2  . Years of Education: N/A   Occupational History  . retired    Social History Main Topics  . Smoking status: Former Research scientist (life sciences)  . Smokeless tobacco: Former Systems developer    Quit date: 05/31/1996  . Alcohol Use: No  . Drug Use: No  . Sexual Activity: No   Other Topics Concern  . Not on file   Social History Narrative     Review of Systems  Constitutional: Negative for fever and appetite change.       Objective:   Physical Exam  CONSTITUTIONAL: Well developed/well  nourished HEAD: Normocephalic/atraumatic EYES: EOMI/PERRL ENMT: Mucous membranes moist NECK: supple no meningeal signs SPINE/BACK:entire spine nontender ABDOMEN: soft, nontender, no rebound or guarding, bowel sounds noted throughout abdomen GU:no cva tenderness NEURO: Pt is awake/alert/appropriate, moves all extremitiesx4.  No facial droop.   EXTREMITIES: pulses normal/equal, full ROM SKIN: warm, color normal PSYCH: no abnormalities of mood noted, alert and oriented to situation Cardiac: occasional skipped beats noted  Lung: increased AP diameter with decreased breath sounds in the bases.     Filed Vitals:   11/20/14 1204  BP: 170/60  Pulse: 71  Temp: 97.9 F (36.6 C)  TempSrc: Oral  Resp: 16  Height: $Remove'5\' 3"'XpzJffG$  (1.6 m)  Weight: 129 lb 12.8 oz (58.877 kg)  SpO2: 94%         Assessment & Plan:  Patient to be on prednisone 9 mg a day. I repeated his blood pressure at 160/60 and did not change his blood pressure medications. We'll recheck in 6 weeks.I personally performed the services described in this documentation, which was scribed in my presence. The recorded information has been reviewed and is accurate.

## 2014-11-21 LAB — SEDIMENTATION RATE: SED RATE: 5 mm/h (ref 0–16)

## 2014-12-06 ENCOUNTER — Telehealth: Payer: Self-pay

## 2014-12-06 NOTE — Telephone Encounter (Signed)
The patient's wife called to ask for assistance with getting a refill for doxazosin.  She said that [facility] thinks that tamulosin is "doing the same thing" as the doxazosin, and they will not cover it.  The patient's wife said that the pharmacy said that the provider must contact the insurance company to clarify the issue.  She said that he hasn't been taking this medication for weeks due to this issue, and his BP was high at his last OV.  Pt CB#: [phone] [employer]: [phone]

## 2014-12-10 NOTE — Telephone Encounter (Signed)
Please regard this message.

## 2014-12-11 NOTE — Telephone Encounter (Signed)
Talked to Dr Everlene Farrier about hx of doxazosin and tamulosin tx. He believes that the tamulosin has been Rxd by urologist for pt's BPH, but asked me to verify this w/wife. Spoke to wife and also checked paper chart, and Dr Everlene Farrier has been Rxing doxazosin since 10/28/96 for HTN and the urologist does Rx the tamulosin for BPH (which was a problem while pt was taking the doxazosin). Pt failed trial of Maxide bc the HCTZ caused kidney function to decline.Both Dxs seem to be stable when pt takes the combination. Completed PA on covermymeds. Pending decision.

## 2014-12-24 NOTE — Telephone Encounter (Signed)
Checked w/pharm who advised that they are able to get the Rx to go through now. Notified wife that it was approved and pharm is getting RF ready.

## 2014-12-30 ENCOUNTER — Ambulatory Visit (INDEPENDENT_AMBULATORY_CARE_PROVIDER_SITE_OTHER): Payer: Medicare PPO | Admitting: Emergency Medicine

## 2014-12-30 ENCOUNTER — Encounter: Payer: Self-pay | Admitting: Emergency Medicine

## 2014-12-30 VITALS — BP 125/55 | HR 70 | Temp 98.2°F | Resp 16 | Ht 64.0 in | Wt 133.0 lb

## 2014-12-30 DIAGNOSIS — R700 Elevated erythrocyte sedimentation rate: Secondary | ICD-10-CM

## 2014-12-30 DIAGNOSIS — M353 Polymyalgia rheumatica: Secondary | ICD-10-CM

## 2014-12-30 NOTE — Progress Notes (Addendum)
° °  Subjective:    Patient ID: Cory Lawrence, male    DOB: [birth year], 81 y.o.   MRN: [id number] This chart was scribed for [name], MD by Zola Button, Medical Scribe. This patient was seen in room 21 and the patient's care was started at 1:23 PM.   HPI HPI Comments: Cory Lawrence is a 81 y.o. male with a hx of HTN, HLD, CAD, A Fib and COPD who presents to the Emergency Department for a follow-up for fibromyalgia. He has been on a tapering dose of prednisone for this and has been doing well on it. Patient is also on doxazosin 4 mg, but his wife has been cutting his tablets in half. She states that the patient has been doing better since cutting back on his medication.   Review of Systems     Objective:   Physical Exam CONSTITUTIONAL: Well developed/well nourished HEAD: Normocephalic/atraumatic EYES: EOM/PERRL ENMT: Mucous membranes moist NECK: supple no meningeal signs SPINE: entire spine nontender CV: S1/S2 noted, no murmurs/rubs/gallops noted. Repeat BP: 128/54 LUNGS: Lungs are clear to auscultation bilaterally, no apparent distress ABDOMEN: soft, nontender, no rebound or guarding GU: no cva tenderness NEURO: Pt is awake/alert, moves all extremitiesx4 EXTREMITIES: pulses normal, full ROM SKIN: warm, color normal PSYCH: no abnormalities of mood noted        Assessment & Plan:  Patient doing well he can only tolerate the Cardura at a half tablet a day. His blood pressure today is normal. Will decrease his prednisone to 8 mg a day. His last sedimentation rate was 5. CBC was also done to follow-up on his thrombocytopenia and anemia.I personally performed the services described in this documentation, which was scribed in my presence. The recorded information has been reviewed and is accurate.

## 2014-12-30 NOTE — Progress Notes (Deleted)
   Subjective:    Patient ID: Cory Lawrence, male    DOB: 01-08-1925, 81 y.o.   MRN: [id number]  HPI    Review of Systems     Objective:   Physical Exam        Assessment & Plan:

## 2014-12-30 NOTE — Patient Instructions (Signed)
Decrease your prednisone to 8 mg a day. Continue the proximal is a Rosen half tablet a day for blood pressure control. Make an appointment to be seen in 6 weeks

## 2014-12-31 LAB — CBC WITH DIFFERENTIAL/PLATELET
Basophils Absolute: 0 10*3/uL (ref 0.0–0.1)
Basophils Relative: 0 % (ref 0–1)
EOS ABS: 0 10*3/uL (ref 0.0–0.7)
Eosinophils Relative: 0 % (ref 0–5)
HEMATOCRIT: 33.7 % — AB (ref 39.0–52.0)
HEMOGLOBIN: 11 g/dL — AB (ref 13.0–17.0)
Lymphocytes Relative: 11 % — ABNORMAL LOW (ref 12–46)
Lymphs Abs: 0.9 10*3/uL (ref 0.7–4.0)
MCH: 31.8 pg (ref 26.0–34.0)
MCHC: 32.6 g/dL (ref 30.0–36.0)
MCV: 97.4 fL (ref 78.0–100.0)
MPV: 10.3 fL (ref 8.6–12.4)
Monocytes Absolute: 0.2 10*3/uL (ref 0.1–1.0)
Monocytes Relative: 3 % (ref 3–12)
NEUTROS PCT: 86 % — AB (ref 43–77)
Neutro Abs: 6.8 10*3/uL (ref 1.7–7.7)
Platelets: 163 10*3/uL (ref 150–400)
RBC: 3.46 MIL/uL — AB (ref 4.22–5.81)
RDW: 15.3 % (ref 11.5–15.5)
WBC: 7.9 10*3/uL (ref 4.0–10.5)

## 2014-12-31 LAB — SEDIMENTATION RATE: SED RATE: 7 mm/h (ref 0–20)

## 2015-01-03 ENCOUNTER — Other Ambulatory Visit: Payer: Self-pay | Admitting: Cardiovascular Disease

## 2015-01-12 ENCOUNTER — Other Ambulatory Visit: Payer: Self-pay | Admitting: Emergency Medicine

## 2015-02-10 ENCOUNTER — Encounter: Payer: Self-pay | Admitting: Emergency Medicine

## 2015-02-10 ENCOUNTER — Ambulatory Visit (INDEPENDENT_AMBULATORY_CARE_PROVIDER_SITE_OTHER): Payer: Medicare PPO | Admitting: Emergency Medicine

## 2015-02-10 VITALS — BP 149/61 | HR 74 | Temp 97.5°F | Resp 16 | Ht 63.5 in | Wt 133.0 lb

## 2015-02-10 DIAGNOSIS — M353 Polymyalgia rheumatica: Secondary | ICD-10-CM

## 2015-02-10 NOTE — Progress Notes (Signed)
Subjective:  This chart was scribed for [name], MD by Donato Schultz, Medical Scribe. This patient was seen in Room 21 and the patient's care was started at 1:26 PM.   Patient ID: Cory Lawrence, male    DOB: [birth year], 81 y.o.   MRN: [id number]  HPI HPI Comments: Cory Lawrence is a 81 y.o. male who presents to the [facility] for a follow-up visit.  He has been taking 8mg  of Prednisone daily.  He has been unable to take Proscar because he cannot afford the medication.  He states that his Prednisone use has increased his appetite and causes him to be shaky.  He denies joint swelling and headaches as associated symptoms.     Past Medical History  Diagnosis Date  . Dizziness   . Diaphoresis   . Palpitations   . COPD (chronic obstructive pulmonary disease)   . Hypertension   . Hyperlipidemia   . GERD (gastroesophageal reflux disease)   . BPH (benign prostatic hypertrophy)   . Personal history of colonic polyps 07/08/2002,06/2011    tubular adenoma, hyperplastic  . Internal hemorrhoids without mention of complication   . Diverticulosis of colon (without mention of hemorrhage)   . Biliary cirrhosis   . Unspecified gastritis and gastroduodenitis without mention of hemorrhage   . NASH (nonalcoholic steatohepatitis)   . Anxiety   . Arthritis   . Ulcer     Gastric/bleeding  . Diabetes mellitus type 2, diet-controlled     managed with diet  . Vitamin B12 deficiency   . PAF (paroxysmal atrial fibrillation)     on Xarelto  . CAD (coronary artery disease)     prior abnormal Myoview; subsequent cath; managed medically  . Heart attack    Past Surgical History  Procedure Laterality Date  . Cataract extraction      bilateral  . Eye surgery      left eye x multiple  . Cardiac catheterization  March 2013    Managed medically   Family History  Problem Relation Age of Onset  . Heart failure Mother   . Diabetes Mother   . Diabetes Sister   . Heart  failure Sister   . Colon cancer Neg Hx   . Cancer Mother     unknown type   History   Social History  . Marital Status: Married    Spouse Name: N/A  . Number of Children: 2  . Years of Education: N/A   Occupational History  . retired    Social History Main Topics  . Smoking status: Former Research scientist (life sciences)  . Smokeless tobacco: Former Systems developer    Quit date: 05/31/1996  . Alcohol Use: No  . Drug Use: No  . Sexual Activity: No   Other Topics Concern  . Not on file   Social History Narrative   Allergies  Allergen Reactions  . Ciprofloxacin Hcl Swelling  . Penicillins Hives    Review of Systems  Constitutional: Positive for appetite change.  Musculoskeletal: Negative for joint swelling.  Neurological: Negative for headaches.    Objective:  Physical Exam  Constitutional: He is oriented to person, place, and time. He appears well-developed and well-nourished.  HENT:  Head: Normocephalic and atraumatic.  Eyes: EOM are normal.  Neck: Normal range of motion.  Cardiovascular: Normal rate, regular rhythm and normal heart sounds.  Exam reveals no gallop and no friction rub.   No murmur heard. Pulmonary/Chest: Effort normal. No respiratory distress. He has decreased  breath sounds. He has no wheezes. He has no rales.  Decreased breath sounds in both bases that is consistent with COPD.  Abdominal: Soft. There is no tenderness.  Musculoskeletal: Normal range of motion.  Neurological: He is alert and oriented to person, place, and time.  Skin: Skin is warm and dry.  Psychiatric: He has a normal mood and affect. His behavior is normal.  Nursing note and vitals reviewed.   BP 149/61 mmHg  Pulse 74  Temp(Src) 97.5 F (36.4 C)  Resp 16  Ht 5' 3.5" (1.613 m)  Wt 133 lb (60.328 kg)  BMI 23.19 kg/m2  SpO2 94% Assessment & Plan:  Patient doing well.  Weight is stable.  Will decrease prednisone to 7mg  a day.  Sed rate was done today.  Recheck in 6 weeks.I personally performed the  services described in this documentation, which was scribed in my presence. The recorded information has been reviewed and is accurate.

## 2015-02-11 LAB — SEDIMENTATION RATE: SED RATE: 5 mm/h (ref 0–20)

## 2015-03-30 ENCOUNTER — Other Ambulatory Visit: Payer: Self-pay | Admitting: Gastroenterology

## 2015-04-01 ENCOUNTER — Other Ambulatory Visit: Payer: Self-pay | Admitting: Gastroenterology

## 2015-04-08 ENCOUNTER — Other Ambulatory Visit: Payer: Self-pay | Admitting: Emergency Medicine

## 2015-04-09 ENCOUNTER — Ambulatory Visit (INDEPENDENT_AMBULATORY_CARE_PROVIDER_SITE_OTHER): Payer: Medicare PPO | Admitting: Emergency Medicine

## 2015-04-09 ENCOUNTER — Encounter: Payer: Self-pay | Admitting: Emergency Medicine

## 2015-04-09 VITALS — BP 135/60 | HR 77 | Temp 97.6°F | Resp 16 | Ht 64.0 in | Wt 129.0 lb

## 2015-04-09 DIAGNOSIS — J449 Chronic obstructive pulmonary disease, unspecified: Secondary | ICD-10-CM | POA: Diagnosis not present

## 2015-04-09 DIAGNOSIS — M353 Polymyalgia rheumatica: Secondary | ICD-10-CM | POA: Diagnosis not present

## 2015-04-09 DIAGNOSIS — M79642 Pain in left hand: Secondary | ICD-10-CM | POA: Diagnosis not present

## 2015-04-09 DIAGNOSIS — M79641 Pain in right hand: Secondary | ICD-10-CM | POA: Diagnosis not present

## 2015-04-09 DIAGNOSIS — R700 Elevated erythrocyte sedimentation rate: Secondary | ICD-10-CM | POA: Diagnosis not present

## 2015-04-09 NOTE — Progress Notes (Signed)
Subjective:  This chart was scribed for  by [name], ED Scribe. The patient was seen in room 22. Patient's care was started at 11:56 AM.   Patient ID: Cory Lawrence, male    DOB: December 06, 1924, 81 y.o.   MRN: [id number]  Chief Complaint  Patient presents with  . polymyalgia    6 week follow up   HPI  HPI Comments: Cory Lawrence is a 81 y.o. male, with a h/p polymyalgia, arthritis, who presents to the [facility] for a 6 week follow-up regarding polymagia. Pt has been on tapering dose of Prednisone since his diagnosis of polymyalgia to maintain his low sed rate. Pt is currently taking 7 mg Prednisone. His last sed rate was 5 on 02/11/15.   Neck Pain Pt reports sudden onset of moderate neck pain upon waking yesterday morning. Pt describes pain as a "crick" in his neck that self-resolved. He attributes pain to moving a 28 foot ladder yesterday. He denies any other arthralgias at this time.   Past Medical History  Diagnosis Date  . Dizziness   . Diaphoresis   . Palpitations   . COPD (chronic obstructive pulmonary disease)   . Hypertension   . Hyperlipidemia   . GERD (gastroesophageal reflux disease)   . BPH (benign prostatic hypertrophy)   . Personal history of colonic polyps 07/08/2002,06/2011    tubular adenoma, hyperplastic  . Internal hemorrhoids without mention of complication   . Diverticulosis of colon (without mention of hemorrhage)   . Biliary cirrhosis   . Unspecified gastritis and gastroduodenitis without mention of hemorrhage   . NASH (nonalcoholic steatohepatitis)   . Anxiety   . Arthritis   . Ulcer     Gastric/bleeding  . Diabetes mellitus type 2, diet-controlled     managed with diet  . Vitamin B12 deficiency   . PAF (paroxysmal atrial fibrillation)     on Xarelto  . CAD (coronary artery disease)     prior abnormal Myoview; subsequent cath; managed medically  . Heart attack    Current Outpatient Prescriptions on File Prior  to Visit  Medication Sig Dispense Refill  . ALPRAZolam (XANAX) 0.5 MG tablet TRY ONE HALF TABLET AT BEDTIME AND IF STILL UNABLE TO SLEEP TAKE THE OTHER HALF TABLET 30 tablet 3  . cyanocobalamin (,VITAMIN B-12,) 1000 MCG/ML injection INJECT 1 ML (1,000 MCG TOTAL) INTO THE MUSCLE EVERY 30 (THIRTY) DAYS. 10 mL 6  . diclofenac sodium (VOLTAREN) 1 % GEL Apply 2 g topically 3 (three) times daily. 100 g 2  . digoxin (LANOXIN) 0.125 MG tablet TAKE 1 TABLET BY MOUTH DAILY 90 tablet 3  . doxazosin (CARDURA) 4 MG tablet TAKE 1 TABLET AT BEDTIME 90 tablet 3  . Fe-Succ Ac-C-Thre Ac-B12-FA (FERREX 150 FORTE PLUS) 50-100 MG CAPS Take 1 capsule by mouth daily. 90 each 3  . finasteride (PROSCAR) 5 MG tablet Take 1 tablet (5 mg total) by mouth daily. 90 tablet 3  . Fluticasone-Salmeterol (ADVAIR DISKUS) 250-50 MCG/DOSE AEPB Inhale 1 puff into the lungs as needed. Shortness of breath.    . Ipratropium-Albuterol (COMBIVENT IN) Inhale 1 puff into the lungs as needed. Shortness of breath.    . isosorbide mononitrate (IMDUR) 30 MG 24 hr tablet TAKE 1 TABLET (30 MG TOTAL) BY MOUTH DAILY. 90 tablet 3  . Melatonin 3 MG CAPS Take 0.75 mg by mouth at bedtime as needed. Sleep     . Omega-3 Fatty Acids (FISH OIL) 1000 MG CAPS  Take 1,000 mg by mouth daily.     . pantoprazole (PROTONIX) 40 MG tablet Take 1 tablet (40 mg total) by mouth daily. 90 tablet 3  . pantoprazole (PROTONIX) 40 MG tablet TAKE 1 TABLET (40 MG TOTAL) BY MOUTH DAILY. 90 tablet 3  . predniSONE (DELTASONE) 5 MG tablet Take 1 tablet daily 30 tablet 5  . Rivaroxaban (XARELTO) 15 MG TABS tablet TAKE 1 TABLET DAILY 90 tablet 3  . simvastatin (ZOCOR) 40 MG tablet TAKE 1 TABLET BY MOUTH EVERY DAY AT BEDTIME 90 tablet 1  . tamsulosin (FLOMAX) 0.4 MG CAPS capsule Take 1 capsule (0.4 mg total) by mouth daily after breakfast. 90 capsule 3  . triamcinolone ointment (KENALOG) 0.1 % APPLY TO AFFECTED AREA TWICE A DAY 30 g 5  . ursodiol (ACTIGALL) 250 MG tablet Take 1  tablet (250 mg total) by mouth 2 (two) times daily. 180 tablet 3   No current facility-administered medications on file prior to visit.   Allergies  Allergen Reactions  . Ciprofloxacin Hcl Swelling  . Penicillins Hives   Review of Systems  Musculoskeletal: Positive for neck pain (resolved). Negative for arthralgias.   BP 135/60 mmHg  Pulse 77  Temp(Src) 97.6 F (36.4 C) (Oral)  Resp 16  Ht $R'5\' 4"'JF$  (1.626 m)  Wt 129 lb (58.514 kg)  BMI 22.13 kg/m2  SpO2 96%    Objective:   Physical Exam  Constitutional: He is oriented to person, place, and time. He appears well-developed and well-nourished. No distress.  HENT:  Head: Normocephalic and atraumatic.  Eyes: Conjunctivae and EOM are normal.  Neck: Neck supple. No tracheal deviation present.  Cardiovascular: Normal rate.   Pulmonary/Chest: Effort normal. No respiratory distress. He has rales (dry in both bases).  Musculoskeletal: Normal range of motion. He exhibits no edema.  Good ROM of all hand joints.  Neurological: He is alert and oriented to person, place, and time.  Skin: Skin is warm and dry.  Psychiatric: He has a normal mood and affect. His behavior is normal.  Nursing note and vitals reviewed.     Assessment & Plan:   1. Polymyalgia He is not having any any issues with joint pain - Sedimentation rate  2. ESR raised Patient to decrease his prednisone to 6 mg a day for the next 2 weeks then 5 mg a day for the next 2 weeks recheck 1 month - Sedimentation rate  3. Obstructive chronic bronchitis without exacerbation He is doing well with his breathing. He is not using his Advair but told he can use his rescue inhaler as needed for breathing issues  4. Bilateral hand pain This has resolved on prednisone   I personally performed the services described in this documentation, which was scribed in my presence. The recorded information has been reviewed and is accurate.  [name], MD  [facility]  04/09/2015 12:52 PM

## 2015-04-09 NOTE — Patient Instructions (Signed)
Decrease prednisone to 6 mg a day for the next 2 weeks then decrease to 5 mg a day for 2 weeks then see me in the office.

## 2015-04-10 LAB — SEDIMENTATION RATE: Sed Rate: 12 mm/hr (ref 0–20)

## 2015-04-10 NOTE — Telephone Encounter (Signed)
Dr Everlene Farrier, pt was just in for f/up for other issues, but don't see vit B lab since 01/2014. OK to give RFs or do you want pt to RTC for labs or wellness visit?

## 2015-04-17 ENCOUNTER — Other Ambulatory Visit: Payer: Self-pay | Admitting: Gastroenterology

## 2015-05-14 ENCOUNTER — Encounter: Payer: Self-pay | Admitting: Emergency Medicine

## 2015-05-14 ENCOUNTER — Telehealth: Payer: Self-pay | Admitting: Emergency Medicine

## 2015-05-14 ENCOUNTER — Ambulatory Visit (INDEPENDENT_AMBULATORY_CARE_PROVIDER_SITE_OTHER): Payer: Medicare PPO | Admitting: Emergency Medicine

## 2015-05-14 VITALS — BP 118/51 | HR 69 | Temp 98.1°F | Resp 16 | Ht 64.0 in | Wt 126.6 lb

## 2015-05-14 DIAGNOSIS — M353 Polymyalgia rheumatica: Secondary | ICD-10-CM

## 2015-05-14 LAB — POCT SEDIMENTATION RATE: POCT SED RATE: 15 mm/hr (ref 0–22)

## 2015-05-14 NOTE — Telephone Encounter (Signed)
Dr. Everlene Farrier wants patient to come in for a 1 month follow up. No open appointments. Can we double book patient? Please call patient's wife [name] with the appointment day and time.   [phone]

## 2015-05-14 NOTE — Telephone Encounter (Signed)
Dr. Everlene Farrier wants patient to come in for a 1 month follow up. No open appointments. Can we double book patient? Please call patient's wife [name] with the appointment day and time.   Stacey added patient to [facility] schedule in one month

## 2015-05-14 NOTE — Progress Notes (Signed)
   Subjective:    Patient ID: Cory Lawrence, male    DOB: 1925/01/25, 81 y.o.   MRN: [id number] This chart was scribed for [name], MD by Zola Button, Medical Scribe. This patient was seen in room 22 and the patient's care was started at 11:20 AM.   HPI HPI Comments: Cory Lawrence is a 81 y.o. male who presents to the [facility] patient is here to have a sedimentation rate done. We are following his polymyalgia rheumatica. He is currently on prednisone 7 mg a day. He is here today for follow-up.    Review of Systems Main issue has been a poor appetite and persistent low weight    Objective:   Physical Exam CONSTITUTIONAL: Well developed/well nourished HEAD: Normocephalic/atraumatic EYES: EOM/PERRL ENMT: Mucous membranes moist NECK: supple no meningeal signs SPINE: entire spine nontender CV: S1/S2 noted, no murmurs/rubs/gallops noted LUNGS: Lungs are clear to auscultation bilaterally, no apparent distress ABDOMEN: soft, nontender, no rebound or guarding GU: no cva tenderness NEURO: Pt is awake/alert, moves all extremitiesx4 EXTREMITIES: pulses normal, full ROM SKIN: warm, color normal PSYCH: no abnormalities of mood noted  Wt Readings from Last 3 Encounters:  05/14/15 126 lb 9.6 oz (57.425 kg)  04/09/15 129 lb (58.514 kg)  02/10/15 133 lb (60.328 kg)        Assessment & Plan:    I'm still worried about his continued weight loss he looks better on the prednisone but it appears there still may be an underlying problem taking his weight down I personally performed the services described in this documentation, which was scribed in my presence. The recorded information has been reviewed and is accurate.  Nena Jordan, MD

## 2015-06-09 ENCOUNTER — Other Ambulatory Visit: Payer: Self-pay

## 2015-06-09 MED ORDER — SIMVASTATIN 40 MG PO TABS: 40.0000 mg | ORAL_TABLET | Freq: Every day | ORAL | Status: DC

## 2015-06-16 ENCOUNTER — Telehealth: Payer: Self-pay | Admitting: Family Medicine

## 2015-06-16 ENCOUNTER — Encounter: Payer: Self-pay | Admitting: Emergency Medicine

## 2015-06-16 ENCOUNTER — Ambulatory Visit (INDEPENDENT_AMBULATORY_CARE_PROVIDER_SITE_OTHER): Payer: Medicare PPO | Admitting: Emergency Medicine

## 2015-06-16 VITALS — BP 137/63 | HR 98 | Temp 98.0°F | Resp 18 | Ht 64.0 in | Wt 127.0 lb

## 2015-06-16 DIAGNOSIS — E1110 Type 2 diabetes mellitus with ketoacidosis without coma: Secondary | ICD-10-CM

## 2015-06-16 DIAGNOSIS — E1310 Other specified diabetes mellitus with ketoacidosis without coma: Secondary | ICD-10-CM

## 2015-06-16 DIAGNOSIS — R634 Abnormal weight loss: Secondary | ICD-10-CM | POA: Diagnosis not present

## 2015-06-16 DIAGNOSIS — J449 Chronic obstructive pulmonary disease, unspecified: Secondary | ICD-10-CM

## 2015-06-16 DIAGNOSIS — M353 Polymyalgia rheumatica: Secondary | ICD-10-CM

## 2015-06-16 DIAGNOSIS — R700 Elevated erythrocyte sedimentation rate: Secondary | ICD-10-CM | POA: Diagnosis not present

## 2015-06-16 NOTE — Addendum Note (Signed)
Addended by: Yvette Rack on: 06/16/2015 02:23 PM   Modules accepted: Orders

## 2015-06-16 NOTE — Telephone Encounter (Signed)
Called patient for him to come to the clinic when he can so we can do an glucose and A1c on him only no special trio for him to come back in now

## 2015-06-16 NOTE — Progress Notes (Addendum)
This chart was scribed for [name], MD by Leandra Kern, Medical Scribe. This patient was seen in Room 23 and the patient's care was started at 12:18 PM.  Chief Complaint:  Chief Complaint  Patient presents with  . Arthritis    inflammatory    HPI: Cory Lawrence is a 81 y.o. male who reports to [facility] today for a 1 month follow up regarding his persistent weight lost.   Pt notes that he has been feeling well, his wife indicates that he is back to his normal self three years ago. Pt denies having breathing difficulty, headaches, or vision complications. Pt reports that his arthralgia symptoms has much resolved since last year. Pt indicates that he is compliant with his medications. He also notes that he has been trying to improve his diet with eating healthy breakfast and protein smoothies. Pt reports that he lifted a water mellon yesterday where he was presented with back back, however it is resolved now.   Pt reports that the situation with his son who was a biology/chemistry major is still the same, he is not trying to transfer to a new place.    Wt Readings from Last 3 Encounters:  06/16/15 127 lb (57.607 kg)  05/14/15 126 lb 9.6 oz (57.425 kg)  04/09/15 129 lb (58.514 kg)    Past Medical History  Diagnosis Date  . Dizziness   . Diaphoresis   . Palpitations   . COPD (chronic obstructive pulmonary disease)   . Hypertension   . Hyperlipidemia   . GERD (gastroesophageal reflux disease)   . BPH (benign prostatic hypertrophy)   . Personal history of colonic polyps 07/08/2002,06/2011    tubular adenoma, hyperplastic  . Internal hemorrhoids without mention of complication   . Diverticulosis of colon (without mention of hemorrhage)   . Biliary cirrhosis   . Unspecified gastritis and gastroduodenitis without mention of hemorrhage   . NASH (nonalcoholic steatohepatitis)   . Anxiety   . Arthritis   . Ulcer     Gastric/bleeding  . Diabetes mellitus type 2,  diet-controlled     managed with diet  . Vitamin B12 deficiency   . PAF (paroxysmal atrial fibrillation)     on Xarelto  . CAD (coronary artery disease)     prior abnormal Myoview; subsequent cath; managed medically  . Heart attack    Past Surgical History  Procedure Laterality Date  . Cataract extraction      bilateral  . Eye surgery      left eye x multiple  . Cardiac catheterization  March 2013    Managed medically   Social History   Social History  . Marital Status: Married    Spouse Name: N/A  . Number of Children: 2  . Years of Education: N/A   Occupational History  . retired    Social History Main Topics  . Smoking status: Former Research scientist (life sciences)  . Smokeless tobacco: Former Systems developer    Quit date: 05/31/1996  . Alcohol Use: No  . Drug Use: No  . Sexual Activity: No   Other Topics Concern  . None   Social History Narrative   Family History  Problem Relation Age of Onset  . Heart failure Mother   . Diabetes Mother   . Diabetes Sister   . Heart failure Sister   . Colon cancer Neg Hx   . Cancer Mother     unknown type   Allergies  Allergen Reactions  . Ciprofloxacin Hcl  Swelling  . Penicillins Hives   Prior to Admission medications   Medication Sig Start Date End Date Taking? Authorizing Provider  ALPRAZolam (XANAX) 0.5 MG tablet TRY ONE HALF TABLET AT BEDTIME AND IF STILL UNABLE TO SLEEP TAKE THE OTHER HALF TABLET 01/13/15   [name], MD  cyanocobalamin (,VITAMIN B-12,) 1000 MCG/ML injection INJECT 1 ML (1,000 MCG TOTAL) INTO THE MUSCLE EVERY 30 (THIRTY) DAYS. 02/25/14   Inda Castle, MD  cyanocobalamin (,VITAMIN B-12,) 1000 MCG/ML injection INJECT 1 ML (1,000 MCG TOTAL) INTO THE MUSCLE EVERY 30 (THIRTY) DAYS. 04/10/15   [name], MD  diclofenac sodium (VOLTAREN) 1 % GEL Apply 2 g topically 3 (three) times daily. 05/20/14   [name], MD  digoxin (LANOXIN) 0.125 MG tablet TAKE 1 TABLET BY MOUTH DAILY 10/07/14   [name], MD  doxazosin (CARDURA) 4  MG tablet TAKE 1 TABLET AT BEDTIME 10/07/14   [name], MD  Fe-Succ Ac-C-Thre Ac-B12-FA (FERREX 150 FORTE PLUS) 50-100 MG CAPS Take 1 capsule by mouth daily. 11/19/12   Sable Feil, MD  finasteride (PROSCAR) 5 MG tablet Take 1 tablet (5 mg total) by mouth daily. 10/07/14   [name], MD  Fluticasone-Salmeterol (ADVAIR DISKUS) 250-50 MCG/DOSE AEPB Inhale 1 puff into the lungs as needed. Shortness of breath.    Historical Provider, MD  Ipratropium-Albuterol (COMBIVENT IN) Inhale 1 puff into the lungs as needed. Shortness of breath.    Historical Provider, MD  isosorbide mononitrate (IMDUR) 30 MG 24 hr tablet TAKE 1 TABLET (30 MG TOTAL) BY MOUTH DAILY. 10/07/14   [name], MD  Melatonin 3 MG CAPS Take 0.75 mg by mouth at bedtime as needed. Sleep     Historical Provider, MD  Omega-3 Fatty Acids (FISH OIL) 1000 MG CAPS Take 1,000 mg by mouth daily.     Historical Provider, MD  pantoprazole (PROTONIX) 40 MG tablet Take 1 tablet (40 mg total) by mouth daily. 10/07/14   [name], MD  pantoprazole (PROTONIX) 40 MG tablet TAKE 1 TABLET (40 MG TOTAL) BY MOUTH DAILY. 04/01/15   Inda Castle, MD  predniSONE (DELTASONE) 5 MG tablet Take 1 tablet daily 11/20/14   [name], MD  Rivaroxaban (XARELTO) 15 MG TABS tablet TAKE 1 TABLET DAILY 10/07/14   [name], MD  simvastatin (ZOCOR) 40 MG tablet Take 1 tablet (40 mg total) by mouth at bedtime. PATIENT NEEDS OFFICE VISIT/FASTING LABS FOR ADDITIONAL REFILLS 06/09/15   [name], MD  tamsulosin [facility]) 0.4 MG CAPS capsule Take 1 capsule (0.4 mg total) by mouth daily after breakfast. 10/07/14   [name], MD  triamcinolone ointment (KENALOG) 0.1 % APPLY TO AFFECTED AREA TWICE A DAY 07/10/14   [name], MD  ursodiol (ACTIGALL) 250 MG tablet Take 1 tablet (250 mg total) by mouth 2 (two) times daily. 10/07/14   [name], MD  ursodiol (ACTIGALL) 250 MG tablet TAKE 1 TABLET BY MOUTH TWICE A DAY 04/17/15   Inda Castle, MD      ROS: The patient denies breathing difficulty, headaches, arthralgia, or vision disturbances.   All other systems have been reviewed and were otherwise negative with the exception of those mentioned in the HPI and as above.    PHYSICAL EXAM: Filed Vitals:   06/16/15 1202  BP: 137/63  Pulse: 98  Temp: 98 F (36.7 C)  Resp: 18   Body mass index is 21.79  kg/(m^2).   General: Alert, no acute distress HEENT:  Normocephalic, atraumatic, oropharynx patent. Eye: Juliette Mangle [facility] Cardiovascular:  Regular rate and rhythm, no rubs murmurs or gallops.  No Carotid bruits, radial pulse intact. No pedal edema.  Respiratory: Clear to auscultation bilaterally.  No wheezes, rales, or rhonchi.  No cyanosis, no use of accessory musculature Abdominal: No organomegaly, abdomen is soft and non-tender, positive bowel sounds.  No masses. Musculoskeletal: Gait intact. No edema, tenderness Skin: No rashes. Neurologic: Facial musculature symmetric. Psychiatric: Patient acts appropriately throughout our interaction. Lymphatic: No cervical or submandibular lymphadenopathy Genitourinary/Anorectal: No acute findings    LABS: Results for orders placed or performed in visit on 05/14/15  POCT SEDIMENTATION RATE  Result Value Ref Range   POCT SED RATE 15 0 - 22 mm/hr     EKG/XRAY:   Primary read interpreted by Dr. Everlene Farrier at [facility].   ASSESSMENT/PLAN: He looks good and he feels good. His last sedimentation rate was 15. We'll decrease to prednisone 5 mg daily recheck 4-6 weeks. We'll arrange for patient to stop by and have a fingerstick sugar done in the next few weeks.  Gross sideeffects, risk and benefits, and alternatives of medications d/w patient. Patient is aware that all medications have potential sideeffects and we are unable to predict every sideeffect or drug-drug interaction that may occur.  [name] MD 06/16/2015 12:18 PM

## 2015-06-18 ENCOUNTER — Encounter: Payer: Self-pay | Admitting: Gastroenterology

## 2015-06-18 ENCOUNTER — Telehealth: Payer: Self-pay | Admitting: Family Medicine

## 2015-06-18 NOTE — Telephone Encounter (Signed)
Tried over and over for several days couldn't reach him if he calls back please ask him to come into our office for a A1c and glucose only

## 2015-06-22 ENCOUNTER — Other Ambulatory Visit (INDEPENDENT_AMBULATORY_CARE_PROVIDER_SITE_OTHER): Payer: Medicare PPO | Admitting: Family Medicine

## 2015-06-22 DIAGNOSIS — E1310 Other specified diabetes mellitus with ketoacidosis without coma: Secondary | ICD-10-CM | POA: Diagnosis not present

## 2015-06-22 DIAGNOSIS — E1110 Type 2 diabetes mellitus with ketoacidosis without coma: Secondary | ICD-10-CM

## 2015-06-22 LAB — GLUCOSE, POCT (MANUAL RESULT ENTRY): POC Glucose: 107 mg/dl — AB (ref 70–99)

## 2015-06-22 LAB — POCT GLYCOSYLATED HEMOGLOBIN (HGB A1C): Hemoglobin A1C: 5.3

## 2015-06-25 ENCOUNTER — Ambulatory Visit: Payer: Medicare PPO | Admitting: Emergency Medicine

## 2015-07-20 ENCOUNTER — Other Ambulatory Visit: Payer: Self-pay | Admitting: Emergency Medicine

## 2015-07-30 ENCOUNTER — Encounter: Payer: Self-pay | Admitting: Emergency Medicine

## 2015-07-30 ENCOUNTER — Ambulatory Visit (INDEPENDENT_AMBULATORY_CARE_PROVIDER_SITE_OTHER): Payer: Medicare PPO | Admitting: Emergency Medicine

## 2015-07-30 VITALS — BP 112/58 | HR 83 | Temp 97.0°F | Resp 16 | Ht 63.0 in | Wt 126.2 lb

## 2015-07-30 DIAGNOSIS — M353 Polymyalgia rheumatica: Secondary | ICD-10-CM | POA: Diagnosis not present

## 2015-07-30 DIAGNOSIS — Z23 Encounter for immunization: Secondary | ICD-10-CM

## 2015-07-30 LAB — CBC WITH DIFFERENTIAL/PLATELET
BASOS ABS: 0 10*3/uL (ref 0.0–0.1)
Basophils Relative: 0 % (ref 0–1)
EOS ABS: 0.1 10*3/uL (ref 0.0–0.7)
EOS PCT: 1 % (ref 0–5)
HEMATOCRIT: 32.7 % — AB (ref 39.0–52.0)
Hemoglobin: 11.1 g/dL — ABNORMAL LOW (ref 13.0–17.0)
LYMPHS ABS: 1 10*3/uL (ref 0.7–4.0)
LYMPHS PCT: 11 % — AB (ref 12–46)
MCH: 31.5 pg (ref 26.0–34.0)
MCHC: 33.9 g/dL (ref 30.0–36.0)
MCV: 92.9 fL (ref 78.0–100.0)
MONOS PCT: 5 % (ref 3–12)
MPV: 9.7 fL (ref 8.6–12.4)
Monocytes Absolute: 0.4 10*3/uL (ref 0.1–1.0)
Neutro Abs: 7.4 10*3/uL (ref 1.7–7.7)
Neutrophils Relative %: 83 % — ABNORMAL HIGH (ref 43–77)
Platelets: 134 10*3/uL — ABNORMAL LOW (ref 150–400)
RBC: 3.52 MIL/uL — ABNORMAL LOW (ref 4.22–5.81)
RDW: 14.7 % (ref 11.5–15.5)
WBC: 8.9 10*3/uL (ref 4.0–10.5)

## 2015-07-30 NOTE — Progress Notes (Signed)
This chart was scribed for [name], MD by Moises Blood, Medical Scribe. This patient was seen in Room 23 and the patient's care was started 12:13 PM.  Chief Complaint:  Chief Complaint  Patient presents with  . Follow-up    6 weeks     HPI: Cory Lawrence is a 81 y.o. male who reports to [facility] today for follow up from previous visit 6 weeks ago.  He's doing well and eating well. His weight is stable.   His wife noted that he's starting to lose his memory. He will remember his friends but forget parts about them occasionally.   He has some loose coughs but doesn't take his medication due to the cost of the medication.   He was recommended a flu shot and received one today.   Past Medical History  Diagnosis Date  . Dizziness   . Diaphoresis   . Palpitations   . COPD (chronic obstructive pulmonary disease)   . Hypertension   . Hyperlipidemia   . GERD (gastroesophageal reflux disease)   . BPH (benign prostatic hypertrophy)   . Personal history of colonic polyps 07/08/2002,06/2011    tubular adenoma, hyperplastic  . Internal hemorrhoids without mention of complication   . Diverticulosis of colon (without mention of hemorrhage)   . Biliary cirrhosis   . Unspecified gastritis and gastroduodenitis without mention of hemorrhage   . NASH (nonalcoholic steatohepatitis)   . Anxiety   . Arthritis   . Ulcer     Gastric/bleeding  . Diabetes mellitus type 2, diet-controlled     managed with diet  . Vitamin B12 deficiency   . PAF (paroxysmal atrial fibrillation)     on Xarelto  . CAD (coronary artery disease)     prior abnormal Myoview; subsequent cath; managed medically  . Heart attack    Past Surgical History  Procedure Laterality Date  . Cataract extraction      bilateral  . Eye surgery      left eye x multiple  . Cardiac catheterization  March 2013    Managed medically   Social History   Social History  . Marital Status: Married    Spouse Name: N/A  .  Number of Children: 2  . Years of Education: N/A   Occupational History  . retired    Social History Main Topics  . Smoking status: Former Research scientist (life sciences)  . Smokeless tobacco: Former Systems developer    Quit date: 05/31/1996  . Alcohol Use: No  . Drug Use: No  . Sexual Activity: No   Other Topics Concern  . None   Social History Narrative   Family History  Problem Relation Age of Onset  . Heart failure Mother   . Diabetes Mother   . Diabetes Sister   . Heart failure Sister   . Colon cancer Neg Hx   . Cancer Mother     unknown type   Allergies  Allergen Reactions  . Ciprofloxacin Hcl Swelling  . Penicillins Hives   Prior to Admission medications   Medication Sig Start Date End Date Taking? Authorizing Provider  ALPRAZolam (XANAX) 0.5 MG tablet TRY ONE HALF TABLET AT BEDTIME AND IF STILL UNABLE TO SLEEP TAKE THE OTHER HALF TABLET 01/13/15   [name], MD  cyanocobalamin (,VITAMIN B-12,) 1000 MCG/ML injection INJECT 1 ML (1,000 MCG TOTAL) INTO THE MUSCLE EVERY 30 (THIRTY) DAYS. 02/25/14   Inda Castle, MD  cyanocobalamin (,VITAMIN B-12,) 1000 MCG/ML injection INJECT 1 ML (1,000  MCG TOTAL) INTO THE MUSCLE EVERY 30 (THIRTY) DAYS. 04/10/15   [name], MD  digoxin (LANOXIN) 0.125 MG tablet TAKE 1 TABLET BY MOUTH DAILY 10/07/14   [name], MD  doxazosin (CARDURA) 4 MG tablet TAKE 1 TABLET AT BEDTIME 10/07/14   [name], MD  Fe-Succ Ac-C-Thre Ac-B12-FA (FERREX 150 FORTE PLUS) 50-100 MG CAPS Take 1 capsule by mouth daily. 11/19/12   Sable Feil, MD  finasteride (PROSCAR) 5 MG tablet Take 1 tablet (5 mg total) by mouth daily. 10/07/14   [name], MD  Fluticasone-Salmeterol (ADVAIR DISKUS) 250-50 MCG/DOSE AEPB Inhale 1 puff into the lungs as needed. Shortness of breath.    Historical Provider, MD  Ipratropium-Albuterol (COMBIVENT IN) Inhale 1 puff into the lungs as needed. Shortness of breath.    Historical Provider, MD  isosorbide mononitrate (IMDUR) 30 MG 24 hr tablet TAKE 1  TABLET (30 MG TOTAL) BY MOUTH DAILY. 10/07/14   [name], MD  Melatonin 3 MG CAPS Take 0.75 mg by mouth at bedtime as needed. Sleep     Historical Provider, MD  Omega-3 Fatty Acids (FISH OIL) 1000 MG CAPS Take 1,000 mg by mouth daily.     Historical Provider, MD  pantoprazole (PROTONIX) 40 MG tablet TAKE 1 TABLET (40 MG TOTAL) BY MOUTH DAILY. 04/01/15   Inda Castle, MD  predniSONE (DELTASONE) 5 MG tablet Take 1 tablet daily 11/20/14   [name], MD  Rivaroxaban (XARELTO) 15 MG TABS tablet TAKE 1 TABLET DAILY 10/07/14   [name], MD  simvastatin (ZOCOR) 40 MG tablet Take 1 tablet (40 mg total) by mouth at bedtime. PATIENT NEEDS OFFICE VISIT/FASTING LABS FOR ADDITIONAL REFILLS 06/09/15   [name], MD  tamsulosin [facility]) 0.4 MG CAPS capsule Take 1 capsule (0.4 mg total) by mouth daily after breakfast. 10/07/14   [name], MD  triamcinolone ointment (KENALOG) 0.1 % APPLY TO AFFECTED AREA TWICE A DAY 07/10/14   [name], MD  ursodiol (ACTIGALL) 250 MG tablet TAKE 1 TABLET BY MOUTH TWICE A DAY 04/17/15   Inda Castle, MD  VOLTAREN 1 % GEL APPLY 2 GRAMS TOPICALLY 3 TIMES DAILY 07/20/15   [name], MD     ROS:  Constitutional: negative for chills, fever, night sweats, weight changes, or fatigue  HEENT: negative for vision changes, hearing loss, congestion, rhinorrhea, ST, epistaxis, or sinus pressure Cardiovascular: negative for chest pain or palpitations Respiratory: negative for hemoptysis, wheezing, shortness of breath, or cough Abdominal: negative for abdominal pain, nausea, vomiting, diarrhea, or constipation Dermatological: negative for rash Neurologic: negative for headache, dizziness, or syncope All other systems reviewed and are otherwise negative with the exception to those above and in the HPI.  PHYSICAL EXAM: Filed Vitals:   07/30/15 1153  BP: 112/58  Pulse: 83  Temp: 97 F (36.1 C)  Resp: 16   Body mass index is 22.36 kg/(m^2).   General:  Alert, no acute distress HEENT:  Normocephalic, atraumatic, oropharynx patent. Eye: Juliette Mangle [facility] Cardiovascular:  Regular rate and rhythm, no rubs murmurs or gallops.  No Carotid bruits, radial pulse intact. No pedal edema.  Respiratory: Clear to auscultation bilaterally.  No wheezes, or rhonchi.  No cyanosis, no use of accessory musculature; Dry rales in both bases Abdominal: No organomegaly, abdomen is soft and non-tender, positive bowel sounds. No masses. Musculoskeletal: Gait intact. No edema, tenderness Skin: senile purpura lesions Neurologic: Facial musculature symmetric. Psychiatric: Patient acts appropriately  throughout our interaction.  Lymphatic: No cervical or submandibular lymphadenopathy Genitourinary/Anorectal: No acute findings  LABS: Results for orders placed or performed in visit on 06/22/15  POCT glucose (manual entry)  Result Value Ref Range   POC Glucose 107 (A) 70 - 99 mg/dl  POCT glycosylated hemoglobin (Hb A1C)  Result Value Ref Range   Hemoglobin A1C 5.3      EKG/XRAY:   Primary read interpreted by Dr. Everlene Farrier at [facility].   ASSESSMENT/PLAN: Patient with polymyalgia rheumatica. He is eating better. Memory is becoming more morbidity issue. He is forgetting things especially peoples names. I did not place him on medication at the present time. A CBC and sedimentation rate wi done. Will decrease his prednisone to 5 alternate 2.5.   I, Moises Blood, attest that this documentation has been prepared under the direction and in the presence of [name], MD. Electronically Signed: Moises Blood, [place]. 07/30/2015 , 12:13 PM . I personally performed the services described in this documentation, which was scribed in my presence. The recorded information has been reviewed and is accurate.   Gross sideeffects, risk and benefits, and alternatives of medications d/w patient. Patient is aware that all medications have potential sideeffects and we are unable to predict  every sideeffect or drug-drug interaction that may occur.  [name] MD 07/30/2015 12:13 PM

## 2015-07-31 LAB — SEDIMENTATION RATE: Sed Rate: 6 mm/hr (ref 0–20)

## 2015-08-01 ENCOUNTER — Other Ambulatory Visit: Payer: Self-pay | Admitting: Emergency Medicine

## 2015-08-02 ENCOUNTER — Other Ambulatory Visit: Payer: Self-pay | Admitting: Emergency Medicine

## 2015-08-03 NOTE — Telephone Encounter (Signed)
Called in xanax 

## 2015-08-04 ENCOUNTER — Encounter: Payer: Self-pay | Admitting: Emergency Medicine

## 2015-09-01 ENCOUNTER — Other Ambulatory Visit: Payer: Self-pay | Admitting: Physician Assistant

## 2015-09-10 ENCOUNTER — Encounter: Payer: Self-pay | Admitting: Emergency Medicine

## 2015-09-10 ENCOUNTER — Encounter: Payer: Self-pay | Admitting: Cardiovascular Disease

## 2015-09-10 ENCOUNTER — Ambulatory Visit (INDEPENDENT_AMBULATORY_CARE_PROVIDER_SITE_OTHER): Payer: Medicare PPO | Admitting: Cardiovascular Disease

## 2015-09-10 ENCOUNTER — Ambulatory Visit (INDEPENDENT_AMBULATORY_CARE_PROVIDER_SITE_OTHER): Payer: Medicare PPO | Admitting: Emergency Medicine

## 2015-09-10 VITALS — BP 152/76 | HR 57 | Ht 64.5 in | Wt 132.8 lb

## 2015-09-10 VITALS — BP 157/67 | HR 56 | Temp 97.9°F | Resp 16 | Ht 64.5 in | Wt 130.6 lb

## 2015-09-10 DIAGNOSIS — I2510 Atherosclerotic heart disease of native coronary artery without angina pectoris: Secondary | ICD-10-CM | POA: Diagnosis not present

## 2015-09-10 DIAGNOSIS — R700 Elevated erythrocyte sedimentation rate: Secondary | ICD-10-CM | POA: Diagnosis not present

## 2015-09-10 DIAGNOSIS — I480 Paroxysmal atrial fibrillation: Secondary | ICD-10-CM | POA: Diagnosis not present

## 2015-09-10 DIAGNOSIS — J449 Chronic obstructive pulmonary disease, unspecified: Secondary | ICD-10-CM

## 2015-09-10 DIAGNOSIS — M353 Polymyalgia rheumatica: Secondary | ICD-10-CM

## 2015-09-10 DIAGNOSIS — I4891 Unspecified atrial fibrillation: Secondary | ICD-10-CM | POA: Diagnosis not present

## 2015-09-10 DIAGNOSIS — R634 Abnormal weight loss: Secondary | ICD-10-CM | POA: Diagnosis not present

## 2015-09-10 MED ORDER — FLUTICASONE-SALMETEROL 250-50 MCG/DOSE IN AEPB: 1.0000 | INHALATION_SPRAY | RESPIRATORY_TRACT | Status: AC | PRN

## 2015-09-10 MED ORDER — IPRATROPIUM-ALBUTEROL 20-100 MCG/ACT IN AERS: 1.0000 | INHALATION_SPRAY | Freq: Four times a day (QID) | RESPIRATORY_TRACT | Status: AC | PRN

## 2015-09-10 NOTE — Progress Notes (Signed)
Subjective:  This chart was scribed for [name] MD,  by Tamsen Roers, at [facility].  This patient was seen in room 21 and the patient's care was started at 12:25 PM.    Patient ID: Cory Lawrence, male    DOB: 1925/03/19, 81 y.o.   MRN: [id number] Chief Complaint  Patient presents with  . Follow-up    OV  07/27/2015    HPI  HPI Comments: Cory Lawrence is a 81 y.o. male who presents to the [facility] for a follow up. Per wife, her husband has been hoarse and coughing often recently.  She also states that he does not take his medication as he should be.   Patient states that he is hungry "all the time".  His joints have been doing "good" recently.     Patient Active Problem List   Diagnosis Date Noted  . ESR raised 07/22/2014  . Anemia 02/18/2014  . Weight loss 12/11/2013  . LV dysfunction 03/02/2012  . Atrial Fibrillation 02/22/2012  . Vitamin B12 deficiency 09/29/2011  . COPD with bronchitis 09/29/2011  . History of gastroesophageal reflux (GERD) 09/29/2011  . Diabetes mellitus type 2, diet-controlled ([place])   . Other B-complex deficiencies 05/24/2011  . Other general symptoms  05/17/2011  . NASH (nonalcoholic steatohepatitis) 05/17/2011  . Cirrhosis of liver not due to alcohol  05/17/2011  . Personal history of colonic polyps 05/17/2011  . CAD (coronary artery disease) 03/17/2011  . Syncope and collapse   . Dizziness   . Diaphoresis   . Chest pain   . Chest tightness   . Palpitations   . COPD (chronic obstructive pulmonary disease) ([place])   . Hypertension   . GERD (gastroesophageal reflux disease)   . BPH (benign prostatic hypertrophy)   . HYPERLIPIDEMIA 11/19/2007  . OBESITY 11/19/2007  . HYPERTENSION 11/19/2007  . INTERNAL HEMORRHOIDS 11/19/2007  . COPD 11/19/2007  . GERD 11/19/2007  . DIVERTICULOSIS, COLON 11/19/2007  . BILIARY CIRRHOSIS, PRIMARY 11/19/2007  . BENIGN PROSTATIC HYPERTROPHY, HX OF  11/19/2007  . COLONIC POLYPS, ADENOMATOUS 07/08/2002   Past Medical History  Diagnosis Date  . Dizziness   . Diaphoresis   . Palpitations   . COPD (chronic obstructive pulmonary disease) ([place])   . Hypertension   . Hyperlipidemia   . GERD (gastroesophageal reflux disease)   . BPH (benign prostatic hypertrophy)   . Personal history of colonic polyps 07/08/2002,06/2011    tubular adenoma, hyperplastic  . Internal hemorrhoids without mention of complication   . Diverticulosis of colon (without mention of hemorrhage)   . Biliary cirrhosis ([place])   . Unspecified gastritis and gastroduodenitis without mention of hemorrhage   . NASH (nonalcoholic steatohepatitis)   . Anxiety   . Arthritis   . Ulcer     Gastric/bleeding  . Diabetes mellitus type 2, diet-controlled ([place])     managed with diet  . Vitamin B12 deficiency   . PAF (paroxysmal atrial fibrillation) (HCC)     on Xarelto  . CAD (coronary artery disease)     prior abnormal Myoview; subsequent cath; managed medically  . Heart attack [facility])    Past Surgical History  Procedure Laterality Date  . Cataract extraction      bilateral  . Eye surgery      left eye x multiple  . Cardiac catheterization  March 2013    Managed medically   Allergies  Allergen Reactions  . Ciprofloxacin Hcl Swelling  .  Penicillins Hives   Prior to Admission medications   Medication Sig Start Date End Date Taking? Authorizing Provider  ALPRAZolam (XANAX) 0.5 MG tablet TAKE 1/2 TABLET AT BEDTIME, IF STILL UNABLE TO SLEEP TAKE THE OTHER 1/2 TABLET 08/02/15   [name], MD  cyanocobalamin (,VITAMIN B-12,) 1000 MCG/ML injection INJECT 1 ML (1,000 MCG TOTAL) INTO THE MUSCLE EVERY 30 (THIRTY) DAYS. 02/25/14   Inda Castle, MD  cyanocobalamin (,VITAMIN B-12,) 1000 MCG/ML injection INJECT 1 ML (1,000 MCG TOTAL) INTO THE MUSCLE EVERY 30 (THIRTY) DAYS. Patient not taking: Reported on 07/30/2015 04/10/15   [name], MD  digoxin (LANOXIN) 0.125 MG tablet  TAKE 1 TABLET BY MOUTH DAILY 10/07/14   [name], MD  doxazosin (CARDURA) 4 MG tablet TAKE 1 TABLET AT BEDTIME 10/07/14   [name], MD  Fe-Succ Ac-C-Thre Ac-B12-FA (FERREX 150 FORTE PLUS) 50-100 MG CAPS Take 1 capsule by mouth daily. 11/19/12   Sable Feil, MD  finasteride (PROSCAR) 5 MG tablet Take 1 tablet (5 mg total) by mouth daily. 10/07/14   [name], MD  Fluticasone-Salmeterol (ADVAIR DISKUS) 250-50 MCG/DOSE AEPB Inhale 1 puff into the lungs as needed. Shortness of breath.    Historical Provider, MD  Ipratropium-Albuterol (COMBIVENT IN) Inhale 1 puff into the lungs as needed. Shortness of breath.    Historical Provider, MD  isosorbide mononitrate (IMDUR) 30 MG 24 hr tablet TAKE 1 TABLET (30 MG TOTAL) BY MOUTH DAILY. 10/07/14   [name], MD  Melatonin 3 MG CAPS Take 0.75 mg by mouth at bedtime as needed. Sleep     Historical Provider, MD  Omega-3 Fatty Acids (FISH OIL) 1000 MG CAPS Take 1,000 mg by mouth daily.     Historical Provider, MD  pantoprazole (PROTONIX) 40 MG tablet TAKE 1 TABLET (40 MG TOTAL) BY MOUTH DAILY. 04/01/15   Inda Castle, MD  predniSONE (DELTASONE) 5 MG tablet Take 1 tablet daily 11/20/14   [name], MD  Rivaroxaban (XARELTO) 15 MG TABS tablet TAKE 1 TABLET DAILY 10/07/14   [name], MD  simvastatin (ZOCOR) 40 MG tablet TAKE 1 TABLET BY MOUTH EVERY DAY AT BEDTIME 09/01/15   [name], MD  tamsulosin (FLOMAX) 0.4 MG CAPS capsule Take 1 capsule (0.4 mg total) by mouth daily after breakfast. 10/07/14   [name], MD  triamcinolone ointment (KENALOG) 0.1 % APPLY TO AFFECTED AREA TWICE A DAY 07/10/14   [name], MD  ursodiol (ACTIGALL) 250 MG tablet TAKE 1 TABLET BY MOUTH TWICE A DAY 04/17/15   Inda Castle, MD  VOLTAREN 1 % GEL APPLY 2 GRAMS TOPICALLY 3 TIMES DAILY 07/20/15   [name], MD   Social History   Social History  . Marital Status: Married    Spouse Name: N/A  . Number of Children: 2  . Years of Education: N/A    Occupational History  . retired    Social History Main Topics  . Smoking status: Former Research scientist (life sciences)  . Smokeless tobacco: Former Systems developer    Quit date: 05/31/1996  . Alcohol Use: No  . Drug Use: No  . Sexual Activity: No   Other Topics Concern  . Not on file   Social History Narrative    Review of Systems  Constitutional: Negative for fever, chills and unexpected weight change.  Eyes: Negative for pain, redness and itching.  Respiratory: Positive for cough. Negative for choking.   Gastrointestinal: Negative for  nausea and vomiting.  Musculoskeletal: Negative for neck pain and neck stiffness.       Objective:   Physical Exam Filed Vitals:   09/10/15 1214  BP: 150/60  Pulse: 56  Temp: 97.9 F (36.6 C)  TempSrc: Oral  Resp: 16  Weight: 130 lb 9.6 oz (59.24 kg)     CONSTITUTIONAL:Not in distress HEAD: Normocephalic/atraumatic EYES: EOMI/PERRL ENMT: Mucous membranes moist NECK: supple no meningeal signs SPINE/BACK:entire spine nontender CV: S1/S2 noted, no murmurs/rubs/gallops noted LUNGS: decreased breath sounds in both bases with a congested cough ABDOMEN: soft, nontender, no rebound or guarding, bowel sounds noted throughout abdomen GU:no cva tenderness NEURO: Pt is awake/alert/appropriate, moves all extremitiesx4.  No facial droop.   EXTREMITIES: pulses normal/equal, full ROM SKIN: warm, color normal PSYCH: no abnormalities of mood noted, alert and oriented to situation  Meds ordered this encounter  Medications  . Fluticasone-Salmeterol (ADVAIR DISKUS) 250-50 MCG/DOSE AEPB    Sig: Inhale 1 puff into the lungs as needed. Shortness of breath.    Dispense:  60 each    Refill:  11  . Ipratropium-Albuterol (COMBIVENT) 20-100 MCG/ACT AERS respimat    Sig: Inhale 1 puff into the lungs every 6 (six) hours as needed for wheezing.    Dispense:  1 Inhaler    Refill:  11        Assessment & Plan:  Recent flare of his COPD. He will be on Advair and Combivent.  Sedimentation rate done today. He is currently on 4 mg prednisone daily. Hopefully can drop to 3. Weight is up some today.I personally performed the services described in this documentation, which was scribed in my presence. The recorded information has been reviewed and is accurate.

## 2015-09-10 NOTE — Patient Instructions (Signed)

## 2015-09-10 NOTE — Progress Notes (Signed)
Cory Lawrence Date of Birth  01-20-1925       [facility]    [employer] [address], [place], [place]  [address]   [place], [place]  [address] [phone]     [phone]   Fax  [phone]    Fax [phone]  Problem List: 1. Coronary artery disease-chronic total occlusion of the right coronary artery with intact left to right collateral filling.    medical therapy 2. Paroxysmal atrial fibrillation 3. Diabetes mellitus 4. Hypertension 5. Tendonitis due to Cipro   History of Present Illness:  Cory Lawrence is an 82 year old gentleman with a history of moderate coronary artery disease. We've been treating him medically. He also has had some paroxysmal atrial fibrillation. There'll seems to be getting along a little bit better.  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    May 30, 2013:   Cory Lawrence is doing well.  No CP  Feb. 11, 2015:  Cory Lawrence is doing ok.  He is losing weight.  He has lost 50 lbs over the past several years.  His last TSH level was drawn 2 years ago. No CP or dyspnea. Does have a hx of renal cell cancer.    Oct. 27, 2015:  Doing well from a cardiac standpoint.  Has just gotten over a severe episode diffuse tendinitis caused by ciprofloxacin.  This was diagnosed by Dr. Amedeo Plenty.  He has been back doing his normal activities - currently working on his truck  Nov. 10, 2016: Doing well.  May have been eating a bit of extra salt Has episodes of weakness,   Thinks that might be related to his lungs. He knows that he has not been using his inhalers quite as much as he needs to.   Current Outpatient Prescriptions on File Prior to Visit  Medication Sig Dispense Refill  . ALPRAZolam (XANAX) 0.5 MG tablet TAKE 1/2 TABLET AT BEDTIME, IF STILL UNABLE TO SLEEP TAKE THE OTHER 1/2 TABLET 30 tablet 5  . cyanocobalamin (,VITAMIN B-12,) 1000 MCG/ML injection INJECT 1 ML (1,000 MCG TOTAL) INTO THE  MUSCLE EVERY 30 (THIRTY) DAYS. 10 mL 6  . cyanocobalamin (,VITAMIN B-12,) 1000 MCG/ML injection INJECT 1 ML (1,000 MCG TOTAL) INTO THE MUSCLE EVERY 30 (THIRTY) DAYS. 10 mL 2  . digoxin (LANOXIN) 0.125 MG tablet TAKE 1 TABLET BY MOUTH DAILY 90 tablet 3  . doxazosin (CARDURA) 4 MG tablet TAKE 1 TABLET AT BEDTIME 90 tablet 3  . Fe-Succ Ac-C-Thre Ac-B12-FA (FERREX 150 FORTE PLUS) 50-100 MG CAPS Take 1 capsule by mouth daily. 90 each 3  . finasteride (PROSCAR) 5 MG tablet Take 1 tablet (5 mg total) by mouth daily. 90 tablet 3  . Fluticasone-Salmeterol (ADVAIR DISKUS) 250-50 MCG/DOSE AEPB Inhale 1 puff into the lungs as needed. Shortness of breath. 60 each 11  . Ipratropium-Albuterol (COMBIVENT) 20-100 MCG/ACT AERS respimat Inhale 1 puff into the lungs every 6 (six) hours as needed for wheezing. 1 Inhaler 11  . isosorbide mononitrate (IMDUR) 30 MG 24 hr tablet TAKE 1 TABLET (30 MG TOTAL) BY MOUTH DAILY. 90 tablet 3  . Melatonin 3 MG CAPS Take 0.75 mg by mouth at bedtime as needed. Sleep     . Omega-3 Fatty Acids (FISH OIL) 1000 MG CAPS Take 1,000 mg by mouth daily.     . pantoprazole (PROTONIX) 40 MG tablet TAKE 1 TABLET (40 MG TOTAL) BY MOUTH DAILY. 90 tablet  3  . predniSONE (DELTASONE) 5 MG tablet Take 1 tablet daily 30 tablet 5  . Rivaroxaban (XARELTO) 15 MG TABS tablet TAKE 1 TABLET DAILY 90 tablet 3  . simvastatin (ZOCOR) 40 MG tablet TAKE 1 TABLET BY MOUTH EVERY DAY AT BEDTIME 30 tablet 0  . tamsulosin (FLOMAX) 0.4 MG CAPS capsule Take 1 capsule (0.4 mg total) by mouth daily after breakfast. 90 capsule 3  . triamcinolone ointment (KENALOG) 0.1 % APPLY TO AFFECTED AREA TWICE A DAY 30 g 5  . ursodiol (ACTIGALL) 250 MG tablet TAKE 1 TABLET BY MOUTH TWICE A DAY 180 tablet 1  . VOLTAREN 1 % GEL APPLY 2 GRAMS TOPICALLY 3 TIMES DAILY 100 g 5   No current facility-administered medications on file prior to visit.    Allergies  Allergen Reactions  . Ciprofloxacin Hcl Swelling  . Penicillins Hives     Past Medical History  Diagnosis Date  . Dizziness   . Diaphoresis   . Palpitations   . COPD (chronic obstructive pulmonary disease) ([place])   . Hypertension   . Hyperlipidemia   . GERD (gastroesophageal reflux disease)   . BPH (benign prostatic hypertrophy)   . Personal history of colonic polyps 07/08/2002,06/2011    tubular adenoma, hyperplastic  . Internal hemorrhoids without mention of complication   . Diverticulosis of colon (without mention of hemorrhage)   . Biliary cirrhosis ([place])   . Unspecified gastritis and gastroduodenitis without mention of hemorrhage   . NASH (nonalcoholic steatohepatitis)   . Anxiety   . Arthritis   . Ulcer     Gastric/bleeding  . Diabetes mellitus type 2, diet-controlled ([place])     managed with diet  . Vitamin B12 deficiency   . PAF (paroxysmal atrial fibrillation) ([facility])     on Xarelto  . CAD (coronary artery disease)     prior abnormal Myoview; subsequent cath; managed medically  . Heart attack [facility])     Past Surgical History  Procedure Laterality Date  . Cataract extraction      bilateral  . Eye surgery      left eye x multiple  . Cardiac catheterization  March 2013    Managed medically    History  Smoking status  . Former Smoker  Smokeless tobacco  . Former Systems developer  . Quit date: 05/31/1996    History  Alcohol Use No    Family History  Problem Relation Age of Onset  . Heart failure Mother   . Diabetes Mother   . Diabetes Sister   . Heart failure Sister   . Colon cancer Neg Hx   . Cancer Mother     unknown type    Reviw of Systems:  Reviewed in the HPI.  All other systems are negative.  Physical Exam: Blood pressure 152/76, pulse 57, height 5' 4.5" (1.638 m), weight 132 lb 12.8 oz (60.238 kg). General: Well developed, well nourished, in no acute distress. Head: Normocephalic, atraumatic, sclera non-icteric, mucus membranes are moist,  Neck: Supple. Carotids are 2 + without bruits. No JVD Lungs:  Reduced breath  sounds in the bases  Heart: regular rate.  normal  S1 S2. No murmurs, gallops or rubs. Abdomen: Soft, non-tender, non-distended with normal bowel sounds. No hepatomegaly. No rebound/guarding. No masses. Msk:  Strength and tone are normal Extremities: No clubbing or cyanosis. No edema.  Distal pedal pulses are 2+ and equal bilaterally. Neuro: Alert and oriented X 3. Moves all extremities spontaneously. Psych:  Responds to questions appropriately with  a normal affect.  ECG: 09/10/2015: Sinus bradycardia at 57. He has a first degree AV block. Nonspecific ST and T wave changes. Assessment / Plan:   1. Coronary artery disease-chronic total occlusion of the right coronary artery with intact left to right collateral filling.    medical therapy He's not having any episodes of angina. I don't think that his current episodes of weakness or due to coronary disease. I think its  due to COPD. He seems to be very stable .   Will see him in 1 year   2. Paroxysmal atrial fibrillation 3. Diabetes mellitus 4. Hypertension 5. Tendonitis due to Cipro    Rose-Marie Hickling, Wonda Cheng, MD  09/10/2015 4:36 PM    [facility] [place],  [place], [place]  [address] Pager [phone] Phone: [phone]; Fax: [phone]   [facility]  [address] [place], [place]  [address] [phone]   Fax [phone]

## 2015-09-11 LAB — SEDIMENTATION RATE: Sed Rate: 10 mm/hr (ref 0–20)

## 2015-10-17 ENCOUNTER — Other Ambulatory Visit: Payer: Self-pay | Admitting: Gastroenterology

## 2015-10-23 ENCOUNTER — Ambulatory Visit: Payer: Medicare PPO | Admitting: Emergency Medicine

## 2015-11-02 NOTE — Telephone Encounter (Signed)
Dr. Everlene Farrier, Dr. Deatra Ina has moved and the GI office has not RF'ed Ursodiol. Pt's wife wants to know if you could RF it?

## 2015-11-05 ENCOUNTER — Ambulatory Visit (INDEPENDENT_AMBULATORY_CARE_PROVIDER_SITE_OTHER): Payer: Medicare Other | Admitting: Emergency Medicine

## 2015-11-05 ENCOUNTER — Encounter: Payer: Self-pay | Admitting: Emergency Medicine

## 2015-11-05 VITALS — BP 120/50 | HR 73 | Temp 98.2°F | Resp 16 | Ht 63.0 in | Wt 126.2 lb

## 2015-11-05 DIAGNOSIS — R739 Hyperglycemia, unspecified: Secondary | ICD-10-CM | POA: Diagnosis not present

## 2015-11-05 DIAGNOSIS — R700 Elevated erythrocyte sedimentation rate: Secondary | ICD-10-CM | POA: Diagnosis not present

## 2015-11-05 DIAGNOSIS — J449 Chronic obstructive pulmonary disease, unspecified: Secondary | ICD-10-CM | POA: Diagnosis not present

## 2015-11-05 DIAGNOSIS — M353 Polymyalgia rheumatica: Secondary | ICD-10-CM | POA: Diagnosis not present

## 2015-11-05 DIAGNOSIS — L609 Nail disorder, unspecified: Secondary | ICD-10-CM

## 2015-11-05 DIAGNOSIS — R634 Abnormal weight loss: Secondary | ICD-10-CM

## 2015-11-05 LAB — CBC WITH DIFFERENTIAL/PLATELET
BASOS PCT: 0 % (ref 0–1)
Basophils Absolute: 0 10*3/uL (ref 0.0–0.1)
EOS ABS: 0.1 10*3/uL (ref 0.0–0.7)
Eosinophils Relative: 1 % (ref 0–5)
HCT: 30.6 % — ABNORMAL LOW (ref 39.0–52.0)
HEMOGLOBIN: 10.6 g/dL — AB (ref 13.0–17.0)
Lymphocytes Relative: 8 % — ABNORMAL LOW (ref 12–46)
Lymphs Abs: 0.8 10*3/uL (ref 0.7–4.0)
MCH: 31.4 pg (ref 26.0–34.0)
MCHC: 34.6 g/dL (ref 30.0–36.0)
MCV: 90.5 fL (ref 78.0–100.0)
MONOS PCT: 4 % (ref 3–12)
MPV: 10.1 fL (ref 8.6–12.4)
Monocytes Absolute: 0.4 10*3/uL (ref 0.1–1.0)
NEUTROS ABS: 8.4 10*3/uL — AB (ref 1.7–7.7)
NEUTROS PCT: 87 % — AB (ref 43–77)
PLATELETS: 127 10*3/uL — AB (ref 150–400)
RBC: 3.38 MIL/uL — AB (ref 4.22–5.81)
RDW: 15.4 % (ref 11.5–15.5)
WBC: 9.7 10*3/uL (ref 4.0–10.5)
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
## 2015-11-06 ENCOUNTER — Other Ambulatory Visit: Payer: Self-pay | Admitting: Emergency Medicine

## 2015-11-06 LAB — BASIC METABOLIC PANEL WITH GFR
BUN: 38 mg/dL — ABNORMAL HIGH (ref 7–25)
CALCIUM: 8.6 mg/dL (ref 8.6–10.3)
CO2: 30 mmol/L (ref 20–31)
Chloride: 102 mmol/L (ref 98–110)
Creat: 1.12 mg/dL — ABNORMAL HIGH (ref 0.70–1.11)
GFR, EST AFRICAN AMERICAN: 67 mL/min (ref >=60)
GFR, EST NON AFRICAN AMERICAN: 58 mL/min — AB (ref >=60)
Glucose, Bld: 98 mg/dL (ref 65–99)
POTASSIUM: 3.9 mmol/L (ref 3.5–5.3)
SODIUM: 138 mmol/L (ref 135–146)

## 2015-11-06 LAB — SEDIMENTATION RATE: SED RATE: 28 mm/h — AB (ref 0–20)

## 2015-11-06 MED ORDER — PREDNISONE 1 MG PO TABS: ORAL_TABLET | ORAL | Status: DC

## 2015-11-25 ENCOUNTER — Other Ambulatory Visit: Payer: Self-pay | Admitting: Emergency Medicine

## 2015-11-30 ENCOUNTER — Ambulatory Visit (INDEPENDENT_AMBULATORY_CARE_PROVIDER_SITE_OTHER): Payer: Medicare Other | Admitting: Podiatry

## 2015-11-30 ENCOUNTER — Encounter: Payer: Self-pay | Admitting: Podiatry

## 2015-11-30 VITALS — BP 153/73 | HR 79 | Resp 18

## 2015-11-30 DIAGNOSIS — M79676 Pain in unspecified toe(s): Secondary | ICD-10-CM

## 2015-11-30 DIAGNOSIS — E1149 Type 2 diabetes mellitus with other diabetic neurological complication: Secondary | ICD-10-CM

## 2015-11-30 DIAGNOSIS — L600 Ingrowing nail: Secondary | ICD-10-CM | POA: Diagnosis not present

## 2015-11-30 DIAGNOSIS — B351 Tinea unguium: Secondary | ICD-10-CM | POA: Diagnosis not present

## 2015-11-30 NOTE — Patient Instructions (Signed)
Diabetes and Foot Care Diabetes may cause you to have problems because of poor blood supply (circulation) to your feet and legs. This may cause the skin on your feet to become thinner, break easier, and heal more slowly. Your skin may become dry, and the skin may peel and crack. You may also have nerve damage in your legs and feet causing decreased feeling in them. You may not notice minor injuries to your feet that could lead to infections or more serious problems. Taking care of your feet is one of the most important things you can do for yourself.  HOME CARE INSTRUCTIONS  Wear shoes at all times, even in the house. Do not go barefoot. Bare feet are easily injured.  Check your feet daily for blisters, cuts, and redness. If you cannot see the bottom of your feet, use a mirror or ask someone for help.  Wash your feet with warm water (do not use hot water) and mild soap. Then pat your feet and the areas between your toes until they are completely dry. Do not soak your feet as this can dry your skin.  Apply a moisturizing lotion or petroleum jelly (that does not contain alcohol and is unscented) to the skin on your feet and to dry, brittle toenails. Do not apply lotion between your toes.  Trim your toenails straight across. Do not dig under them or around the cuticle. File the edges of your nails with an emery board or nail file.  Do not cut corns or calluses or try to remove them with medicine.  Wear clean socks or stockings every day. Make sure they are not too tight. Do not wear knee-high stockings since they may decrease blood flow to your legs.  Wear shoes that fit properly and have enough cushioning. To break in new shoes, wear them for just a few hours a day. This prevents you from injuring your feet. Always look in your shoes before you put them on to be sure there are no objects inside.  Do not cross your legs. This may decrease the blood flow to your feet.  If you find a minor scrape,  cut, or break in the skin on your feet, keep it and the skin around it clean and dry. These areas may be cleansed with mild soap and water. Do not cleanse the area with peroxide, alcohol, or iodine.  When you remove an adhesive bandage, be sure not to damage the skin around it.  If you have a wound, look at it several times a day to make sure it is healing.  Do not use heating pads or hot water bottles. They may burn your skin. If you have lost feeling in your feet or legs, you may not know it is happening until it is too late.  Make sure your health care provider performs a complete foot exam at least annually or more often if you have foot problems. Report any cuts, sores, or bruises to your health care provider immediately. SEEK MEDICAL CARE IF:   You have an injury that is not healing.  You have cuts or breaks in the skin.  You have an ingrown nail.  You notice redness on your legs or feet.  You feel burning or tingling in your legs or feet.  You have pain or cramps in your legs and feet.  Your legs or feet are numb.  Your feet always feel cold. SEEK IMMEDIATE MEDICAL CARE IF:   There is increasing redness,   swelling, or pain in or around a wound.  There is a red line that goes up your leg.  Pus is coming from a wound.  You develop a fever or as directed by your health care provider.  You notice a bad smell coming from an ulcer or wound.   This information is not intended to replace advice given to you by your health care provider. Make sure you discuss any questions you have with your health care provider.   Document Released: 10/14/2000 Document Revised: 06/19/2013 Document Reviewed: 03/26/2013 Elsevier Interactive Patient Education 2016 [employer]  

## 2015-11-30 NOTE — Progress Notes (Signed)
   Subjective:    Patient ID: Cory Lawrence, male    DOB: May 19, 1925, 82 y.o.   MRN: [id number]  HPI   82 year old male presents the office they with his wife for concerns of thick, painful, elongated toenails which also become ingrown in both of his big toes. He denies any drainage or redness from around the toenail sites. He has diabetic however since he has lost 60 pounds his blood sugars been under much better control. Denies any history of ulceration. Denies any numbness or tingling,  However per his wife he does state that he gets some numbness and tingling to his feet at times. Denies any claudication symptoms. No other complaints.  Review of Systems  All other systems reviewed and are negative.      Objective:   Physical Exam General: AAO x3, NAD  Dermatological:  Nails are hypertrophic, dystrophic, brittle, discolored, elongated 10.  On the hallux toenails is incurvation of both the medial and lateral nail borders distally.There is no surrounding erythema or drainage. There is tenderness in nails 1-5 bilaterally.  No open lesions or pre-ulcer lesions identified. There is dry skin present.  Vascular: Dorsalis Pedis 2/4 and Posterior Tibial artery pedal pulses are 1/4 bilateral with immedate capillary fill time. No varicosities and no lower extremity edema present bilateral. There is no pain with calf compression, swelling, warmth, erythema.   Neruologic:  Sensation mildly decreased with Simms Weinstein monofilament, vibratory sensation intact, Achilles tendon reflex intact.  Musculoskeletal: No gross boney pedal deformities bilateral. No pain, crepitus, or limitation noted with foot and ankle range of motion bilateral. Muscular strength 5/5 in all groups tested bilateral.  Gait: Unassisted, Nonantalgic.      Assessment & Plan:  82 year old male with symptomatic onychomycosis , ingrown toenail -Treatment options discussed including all alternatives, risks, and  complications -Etiology of symptoms were discussed -Nails debrided 10 without complications or bleeding. -Daily foot inspection -Follow-up in 3 months or sooner if any problems arise. In the meantime, encouraged to call the office with any questions, concerns, change in symptoms.   Celesta Gentile, DPM

## 2015-12-01 ENCOUNTER — Telehealth: Payer: Self-pay

## 2015-12-01 ENCOUNTER — Encounter: Payer: Self-pay | Admitting: Podiatry

## 2015-12-01 DIAGNOSIS — M159 Polyosteoarthritis, unspecified: Secondary | ICD-10-CM

## 2015-12-01 NOTE — Telephone Encounter (Signed)
PA needed for diclofenac gel. Dr Everlene Farrier, I am using Dx of Polymyalgia rheumatica, but it is more likely to be covered by OA. He doesn't also have OA does he? Didn't see on problem list but wanted to check. Also I see that there is no h/o NSAID use. Are NSAIDS contraindicated for pt?

## 2015-12-02 DIAGNOSIS — M1990 Unspecified osteoarthritis, unspecified site: Secondary | ICD-10-CM | POA: Insufficient documentation

## 2015-12-02 NOTE — Telephone Encounter (Signed)
Added OA to problem list and completed PA on covermymeds. Pending.

## 2015-12-02 NOTE — Telephone Encounter (Signed)
Nonsteroidals are contraindicated due to his age of 17 and kidney function. He does have osteoarthritis we can add this to the problem list and then prescribed the diclofenac gel.

## 2015-12-03 ENCOUNTER — Other Ambulatory Visit: Payer: Self-pay | Admitting: Emergency Medicine

## 2015-12-03 NOTE — Telephone Encounter (Signed)
PA approved through 10/30/16. Notified pharm.  

## 2015-12-08 ENCOUNTER — Other Ambulatory Visit: Payer: Self-pay | Admitting: Emergency Medicine

## 2015-12-08 NOTE — Telephone Encounter (Signed)
Dr Everlene Farrier, I didn't see this med discussed at Jan OV, or recently. Do you want to give RFs, or want Dr Acie Fredrickson to manage?

## 2015-12-08 NOTE — Telephone Encounter (Signed)
Dr. Acie Fredrickson should be in charge of his Lanoxin.

## 2015-12-09 ENCOUNTER — Other Ambulatory Visit: Payer: Self-pay | Admitting: Cardiovascular Disease

## 2015-12-10 NOTE — Telephone Encounter (Signed)
Dr Everlene Farrier, you just saw pt last month for check up, but don't see this med discussed recently. OK to RF?

## 2015-12-17 ENCOUNTER — Other Ambulatory Visit: Payer: Self-pay | Admitting: Emergency Medicine

## 2015-12-17 DIAGNOSIS — I4891 Unspecified atrial fibrillation: Secondary | ICD-10-CM

## 2015-12-18 NOTE — Telephone Encounter (Signed)
Dr Everlene Farrier, you just saw pt for check up, but I don't see this med discussed recently. OK to RF? I don't see any recent OVs w/ Dr Acie Fredrickson, so not sure if pt is still seeing his cardiologist? It looks like you have been Rxing this.

## 2015-12-20 NOTE — Telephone Encounter (Signed)
Gave 90 day supply. Needs to return for OV with Dr. Everlene Farrier for further refills.

## 2015-12-27 ENCOUNTER — Other Ambulatory Visit: Payer: Self-pay | Admitting: Emergency Medicine

## 2015-12-29 ENCOUNTER — Ambulatory Visit: Payer: Medicare Other | Admitting: Emergency Medicine

## 2015-12-31 ENCOUNTER — Emergency Department (HOSPITAL_COMMUNITY): Payer: Medicare Other

## 2015-12-31 ENCOUNTER — Ambulatory Visit (INDEPENDENT_AMBULATORY_CARE_PROVIDER_SITE_OTHER): Payer: Medicare Other

## 2015-12-31 ENCOUNTER — Encounter: Payer: Self-pay | Admitting: Emergency Medicine

## 2015-12-31 ENCOUNTER — Observation Stay (HOSPITAL_COMMUNITY)
Admission: EM | Admit: 2015-12-31 | Discharge: 2016-01-02 | Disposition: A | Discharge status: Home / Self Care | Payer: Medicare Other | Attending: Family Medicine | Admitting: Family Medicine

## 2015-12-31 ENCOUNTER — Encounter (HOSPITAL_COMMUNITY): Payer: Self-pay

## 2015-12-31 ENCOUNTER — Other Ambulatory Visit: Payer: Self-pay | Admitting: Emergency Medicine

## 2015-12-31 ENCOUNTER — Ambulatory Visit (INDEPENDENT_AMBULATORY_CARE_PROVIDER_SITE_OTHER): Payer: Medicare Other | Admitting: Emergency Medicine

## 2015-12-31 VITALS — BP 104/58 | HR 66 | Temp 97.7°F | Resp 18 | Wt 124.6 lb

## 2015-12-31 DIAGNOSIS — Z791 Long term (current) use of non-steroidal anti-inflammatories (NSAID): Secondary | ICD-10-CM | POA: Insufficient documentation

## 2015-12-31 DIAGNOSIS — Z7901 Long term (current) use of anticoagulants: Secondary | ICD-10-CM | POA: Diagnosis not present

## 2015-12-31 DIAGNOSIS — Z8739 Personal history of other diseases of the musculoskeletal system and connective tissue: Secondary | ICD-10-CM | POA: Diagnosis not present

## 2015-12-31 DIAGNOSIS — E785 Hyperlipidemia, unspecified: Secondary | ICD-10-CM | POA: Insufficient documentation

## 2015-12-31 DIAGNOSIS — R700 Elevated erythrocyte sedimentation rate: Secondary | ICD-10-CM | POA: Diagnosis not present

## 2015-12-31 DIAGNOSIS — R05 Cough: Secondary | ICD-10-CM

## 2015-12-31 DIAGNOSIS — Z87891 Personal history of nicotine dependence: Secondary | ICD-10-CM | POA: Insufficient documentation

## 2015-12-31 DIAGNOSIS — Z79899 Other long term (current) drug therapy: Secondary | ICD-10-CM | POA: Diagnosis not present

## 2015-12-31 DIAGNOSIS — R739 Hyperglycemia, unspecified: Secondary | ICD-10-CM

## 2015-12-31 DIAGNOSIS — I10 Essential (primary) hypertension: Secondary | ICD-10-CM | POA: Insufficient documentation

## 2015-12-31 DIAGNOSIS — K219 Gastro-esophageal reflux disease without esophagitis: Secondary | ICD-10-CM | POA: Insufficient documentation

## 2015-12-31 DIAGNOSIS — R634 Abnormal weight loss: Secondary | ICD-10-CM

## 2015-12-31 DIAGNOSIS — Z880 Allergy status to penicillin: Secondary | ICD-10-CM | POA: Insufficient documentation

## 2015-12-31 DIAGNOSIS — R7989 Other specified abnormal findings of blood chemistry: Secondary | ICD-10-CM | POA: Insufficient documentation

## 2015-12-31 DIAGNOSIS — Z86010 Personal history of colonic polyps: Secondary | ICD-10-CM | POA: Insufficient documentation

## 2015-12-31 DIAGNOSIS — Z8659 Personal history of other mental and behavioral disorders: Secondary | ICD-10-CM | POA: Diagnosis not present

## 2015-12-31 DIAGNOSIS — M353 Polymyalgia rheumatica: Secondary | ICD-10-CM

## 2015-12-31 DIAGNOSIS — J441 Chronic obstructive pulmonary disease with (acute) exacerbation: Principal | ICD-10-CM | POA: Diagnosis present

## 2015-12-31 DIAGNOSIS — I252 Old myocardial infarction: Secondary | ICD-10-CM | POA: Diagnosis not present

## 2015-12-31 DIAGNOSIS — Z7952 Long term (current) use of systemic steroids: Secondary | ICD-10-CM | POA: Insufficient documentation

## 2015-12-31 DIAGNOSIS — J449 Chronic obstructive pulmonary disease, unspecified: Secondary | ICD-10-CM | POA: Diagnosis present

## 2015-12-31 DIAGNOSIS — N400 Enlarged prostate without lower urinary tract symptoms: Secondary | ICD-10-CM | POA: Diagnosis not present

## 2015-12-31 DIAGNOSIS — I480 Paroxysmal atrial fibrillation: Secondary | ICD-10-CM | POA: Diagnosis not present

## 2015-12-31 DIAGNOSIS — IMO0001 Reserved for inherently not codable concepts without codable children: Secondary | ICD-10-CM

## 2015-12-31 DIAGNOSIS — I509 Heart failure, unspecified: Secondary | ICD-10-CM | POA: Insufficient documentation

## 2015-12-31 DIAGNOSIS — E119 Type 2 diabetes mellitus without complications: Secondary | ICD-10-CM | POA: Insufficient documentation

## 2015-12-31 DIAGNOSIS — E118 Type 2 diabetes mellitus with unspecified complications: Secondary | ICD-10-CM | POA: Insufficient documentation

## 2015-12-31 DIAGNOSIS — R0602 Shortness of breath: Secondary | ICD-10-CM

## 2015-12-31 DIAGNOSIS — R059 Cough, unspecified: Secondary | ICD-10-CM

## 2015-12-31 DIAGNOSIS — I2510 Atherosclerotic heart disease of native coronary artery without angina pectoris: Secondary | ICD-10-CM | POA: Diagnosis not present

## 2015-12-31 LAB — COMPLETE METABOLIC PANEL WITH GFR
ALBUMIN: 3.3 g/dL — AB (ref 3.6–5.1)
ALK PHOS: 50 U/L (ref 40–115)
ALT: 15 U/L (ref 9–46)
AST: 20 U/L (ref 10–35)
BUN: 43 mg/dL — AB (ref 7–25)
CALCIUM: 8.1 mg/dL — AB (ref 8.6–10.3)
CO2: 24 mmol/L (ref 20–31)
CREATININE: 1.2 mg/dL — AB (ref 0.70–1.11)
Chloride: 104 mmol/L (ref 98–110)
GFR, Est African American: 62 mL/min (ref >=60)
GFR, Est Non African American: 54 mL/min — ABNORMAL LOW (ref >=60)
GLUCOSE: 114 mg/dL — AB (ref 65–99)
Potassium: 4.7 mmol/L (ref 3.5–5.3)
Sodium: 138 mmol/L (ref 135–146)
Total Bilirubin: 0.4 mg/dL (ref 0.2–1.2)
Total Protein: 6.4 g/dL (ref 6.1–8.1)

## 2015-12-31 LAB — COMPREHENSIVE METABOLIC PANEL
ALBUMIN: 3.3 g/dL — AB (ref 3.5–5.0)
ALT: 19 U/L (ref 17–63)
AST: 23 U/L (ref 15–41)
Alkaline Phosphatase: 50 U/L (ref 38–126)
Anion gap: 10 (ref 5–15)
BUN: 41 mg/dL — ABNORMAL HIGH (ref 6–20)
CALCIUM: 8.5 mg/dL — AB (ref 8.9–10.3)
CHLORIDE: 106 mmol/L (ref 101–111)
CO2: 25 mmol/L (ref 22–32)
CREATININE: 1.37 mg/dL — AB (ref 0.61–1.24)
GFR calc Af Amer: 51 mL/min — ABNORMAL LOW (ref >60)
GFR calc non Af Amer: 44 mL/min — ABNORMAL LOW (ref >60)
Glucose, Bld: 120 mg/dL — ABNORMAL HIGH (ref 65–99)
Potassium: 4.7 mmol/L (ref 3.5–5.1)
SODIUM: 141 mmol/L (ref 135–145)
Total Bilirubin: 0.5 mg/dL (ref 0.3–1.2)
Total Protein: 6.8 g/dL (ref 6.5–8.1)

## 2015-12-31 LAB — CBC WITH DIFFERENTIAL/PLATELET
BASOS ABS: 0 10*3/uL (ref 0.0–0.1)
BASOS PCT: 0 %
Basophils Absolute: 0 10*3/uL (ref 0.0–0.1)
Basophils Relative: 0 % (ref 0–1)
EOS ABS: 0 10*3/uL (ref 0.0–0.7)
EOS PCT: 0 %
Eosinophils Absolute: 0 10*3/uL (ref 0.0–0.7)
Eosinophils Relative: 0 % (ref 0–5)
HEMATOCRIT: 31.2 % — AB (ref 39.0–52.0)
HEMATOCRIT: 30.2 % — AB (ref 39.0–52.0)
HEMOGLOBIN: 10.4 g/dL — AB (ref 13.0–17.0)
Hemoglobin: 10.1 g/dL — ABNORMAL LOW (ref 13.0–17.0)
LYMPHS PCT: 17 % (ref 12–46)
Lymphocytes Relative: 23 %
Lymphs Abs: 0.8 10*3/uL (ref 0.7–4.0)
Lymphs Abs: 0.9 10*3/uL (ref 0.7–4.0)
MCH: 30.2 pg (ref 26.0–34.0)
MCH: 31.3 pg (ref 26.0–34.0)
MCHC: 33.3 g/dL (ref 30.0–36.0)
MCHC: 33.4 g/dL (ref 30.0–36.0)
MCV: 90.7 fL (ref 78.0–100.0)
MCV: 93.5 fL (ref 78.0–100.0)
MONO ABS: 0.2 10*3/uL (ref 0.1–1.0)
MPV: 10.2 fL (ref 8.6–12.4)
Monocytes Absolute: 0.2 10*3/uL (ref 0.1–1.0)
Monocytes Relative: 5 % (ref 3–12)
Monocytes Relative: 5 %
NEUTROS ABS: 3.7 10*3/uL (ref 1.7–7.7)
NEUTROS ABS: 2.7 10*3/uL (ref 1.7–7.7)
NEUTROS PCT: 78 % — AB (ref 43–77)
Neutrophils Relative %: 72 %
PLATELETS: 87 10*3/uL — AB (ref 150–400)
Platelets: 99 10*3/uL — ABNORMAL LOW (ref 150–400)
RBC: 3.44 MIL/uL — AB (ref 4.22–5.81)
RBC: 3.23 MIL/uL — ABNORMAL LOW (ref 4.22–5.81)
RDW: 15.8 % — ABNORMAL HIGH (ref 11.5–15.5)
RDW: 15.5 % (ref 11.5–15.5)
WBC: 4.7 10*3/uL (ref 4.0–10.5)
WBC: 3.8 10*3/uL — ABNORMAL LOW (ref 4.0–10.5)

## 2015-12-31 LAB — TROPONIN I: Troponin I: <0.03 ng/mL (ref <0.031)

## 2015-12-31 LAB — GLUCOSE, CAPILLARY: GLUCOSE-CAPILLARY: 168 mg/dL — AB (ref 65–99)

## 2015-12-31 LAB — BRAIN NATRIURETIC PEPTIDE: B Natriuretic Peptide: 116.5 pg/mL — ABNORMAL HIGH (ref 0.0–100.0)

## 2015-12-31 LAB — DIGOXIN LEVEL: DIGOXIN LVL: 1.1 ng/mL (ref 0.8–2.0)

## 2015-12-31 MED ORDER — IPRATROPIUM BROMIDE 0.02 % IN SOLN
0.5000 mg | Freq: Once | RESPIRATORY_TRACT | Status: AC
Start: 2015-12-31 — End: 2015-12-31
  Administered 2015-12-31: 0.5 mg via RESPIRATORY_TRACT

## 2015-12-31 MED ORDER — METHYLPREDNISOLONE SODIUM SUCC 125 MG IJ SOLR
125.0000 mg | Freq: Once | INTRAMUSCULAR | Status: AC
  Administered 2015-12-31: 125 mg via INTRAVENOUS
  Filled 2015-12-31: qty 2

## 2015-12-31 MED ORDER — FINASTERIDE 5 MG PO TABS
5.0000 mg | ORAL_TABLET | Freq: Every day | ORAL | Status: DC
  Administered 2016-01-01 – 2016-01-02 (×2): 5 mg via ORAL
  Filled 2015-12-31 (×2): qty 1

## 2015-12-31 MED ORDER — SODIUM CHLORIDE 0.9% FLUSH
3.0000 mL | Freq: Two times a day (BID) | INTRAVENOUS | Status: DC
  Administered 2016-01-01: 3 mL via INTRAVENOUS

## 2015-12-31 MED ORDER — TIOTROPIUM BROMIDE MONOHYDRATE 18 MCG IN CAPS
18.0000 ug | ORAL_CAPSULE | Freq: Every day | RESPIRATORY_TRACT | Status: DC
  Administered 2016-01-01 – 2016-01-02 (×2): 18 ug via RESPIRATORY_TRACT
  Filled 2015-12-31: qty 5

## 2015-12-31 MED ORDER — RIVAROXABAN 15 MG PO TABS
15.0000 mg | ORAL_TABLET | Freq: Every day | ORAL | Status: DC
  Administered 2016-01-01: 15 mg via ORAL
  Filled 2015-12-31: qty 1

## 2015-12-31 MED ORDER — DOXAZOSIN MESYLATE 2 MG PO TABS
4.0000 mg | ORAL_TABLET | Freq: Every day | ORAL | Status: DC
  Administered 2015-12-31 – 2016-01-01 (×2): 4 mg via ORAL
  Filled 2015-12-31 (×2): qty 2

## 2015-12-31 MED ORDER — ALBUTEROL SULFATE (2.5 MG/3ML) 0.083% IN NEBU: 2.5000 mg | INHALATION_SOLUTION | RESPIRATORY_TRACT | Status: DC | PRN

## 2015-12-31 MED ORDER — PANTOPRAZOLE SODIUM 40 MG PO TBEC
40.0000 mg | DELAYED_RELEASE_TABLET | Freq: Every day | ORAL | Status: DC
  Administered 2016-01-01 – 2016-01-02 (×2): 40 mg via ORAL
  Filled 2015-12-31 (×2): qty 1

## 2015-12-31 MED ORDER — ALBUTEROL SULFATE (2.5 MG/3ML) 0.083% IN NEBU
5.0000 mg | INHALATION_SOLUTION | Freq: Once | RESPIRATORY_TRACT | Status: AC
  Administered 2015-12-31: 5 mg via RESPIRATORY_TRACT
  Filled 2015-12-31: qty 6

## 2015-12-31 MED ORDER — HEPARIN SODIUM (PORCINE) 5000 UNIT/ML IJ SOLN: 5000.0000 [IU] | Freq: Three times a day (TID) | INTRAMUSCULAR | Status: DC

## 2015-12-31 MED ORDER — SODIUM CHLORIDE 0.9% FLUSH
3.0000 mL | Freq: Two times a day (BID) | INTRAVENOUS | Status: DC
  Administered 2015-12-31 – 2016-01-02 (×3): 3 mL via INTRAVENOUS

## 2015-12-31 MED ORDER — ALBUTEROL SULFATE (2.5 MG/3ML) 0.083% IN NEBU
2.5000 mg | INHALATION_SOLUTION | Freq: Four times a day (QID) | RESPIRATORY_TRACT | Status: DC
  Administered 2016-01-01 (×3): 2.5 mg via RESPIRATORY_TRACT
  Filled 2015-12-31 (×5): qty 3

## 2015-12-31 MED ORDER — SODIUM CHLORIDE 0.9 % IV SOLN: 250.0000 mL | INTRAVENOUS | Status: DC | PRN

## 2015-12-31 MED ORDER — DOXYCYCLINE HYCLATE 100 MG IV SOLR
100.0000 mg | Freq: Two times a day (BID) | INTRAVENOUS | Status: DC
  Administered 2016-01-01: 100 mg via INTRAVENOUS
  Filled 2015-12-31 (×2): qty 100

## 2015-12-31 MED ORDER — SODIUM CHLORIDE 0.9% FLUSH: 3.0000 mL | INTRAVENOUS | Status: DC | PRN

## 2015-12-31 MED ORDER — ISOSORBIDE MONONITRATE ER 30 MG PO TB24
30.0000 mg | ORAL_TABLET | Freq: Every day | ORAL | Status: DC
  Administered 2016-01-01 – 2016-01-02 (×2): 30 mg via ORAL
  Filled 2015-12-31 (×2): qty 1

## 2015-12-31 MED ORDER — POLYETHYLENE GLYCOL 3350 17 G PO PACK
17.0000 g | PACK | Freq: Every day | ORAL | Status: DC | PRN
Start: 2015-12-31 — End: 2016-01-02

## 2015-12-31 MED ORDER — AZITHROMYCIN 500 MG PO TABS
500.0000 mg | ORAL_TABLET | Freq: Every day | ORAL | Status: AC
  Administered 2015-12-31: 500 mg via ORAL
  Filled 2015-12-31: qty 1

## 2015-12-31 MED ORDER — TAMSULOSIN HCL 0.4 MG PO CAPS
0.4000 mg | ORAL_CAPSULE | Freq: Every day | ORAL | Status: DC
  Administered 2016-01-01 – 2016-01-02 (×2): 0.4 mg via ORAL
  Filled 2015-12-31 (×2): qty 1

## 2015-12-31 MED ORDER — ALBUTEROL SULFATE (2.5 MG/3ML) 0.083% IN NEBU
2.5000 mg | INHALATION_SOLUTION | Freq: Once | RESPIRATORY_TRACT | Status: AC
  Administered 2015-12-31: 2.5 mg via RESPIRATORY_TRACT

## 2015-12-31 MED ORDER — SODIUM CHLORIDE 0.9 % IV SOLN
INTRAVENOUS | Status: DC
  Administered 2015-12-31: 17:00:00 via INTRAVENOUS

## 2015-12-31 MED ORDER — DIGOXIN 125 MCG PO TABS
125.0000 ug | ORAL_TABLET | Freq: Every day | ORAL | Status: DC
  Administered 2016-01-01 – 2016-01-02 (×2): 125 ug via ORAL
  Filled 2015-12-31 (×2): qty 1

## 2015-12-31 MED ORDER — AZITHROMYCIN 500 MG PO TABS: 250.0000 mg | ORAL_TABLET | Freq: Every day | ORAL | Status: DC

## 2015-12-31 MED ORDER — URSODIOL 300 MG PO CAPS
300.0000 mg | ORAL_CAPSULE | Freq: Two times a day (BID) | ORAL | Status: DC
  Administered 2015-12-31 – 2016-01-02 (×4): 300 mg via ORAL
  Filled 2015-12-31 (×5): qty 1

## 2015-12-31 MED ORDER — PREDNISONE 50 MG PO TABS
50.0000 mg | ORAL_TABLET | Freq: Every day | ORAL | Status: DC
  Administered 2016-01-01 – 2016-01-02 (×2): 50 mg via ORAL
  Filled 2015-12-31 (×2): qty 1

## 2015-12-31 NOTE — ED Provider Notes (Signed)
CSN: [id number]     Arrival date & time 12/31/15  1653 History   First MD Initiated Contact with Patient 12/31/15 1655     Chief Complaint  Patient presents with  . Shortness of Breath  . Cough     (Consider location/radiation/quality/duration/timing/severity/associated sxs/prior Treatment) HPI Comments: Patient here complaining of 10 days of shortness of breath which is been worse for the past 2 days. He endorses nonproductive cough as well as dyspnea on exertion. Denies any fever or chills. Cough has caused chest discomfort but denies any anginal component. No fever or chills. No vomiting or diarrhea. Does have a history of COPD as well as CHF. Saw his physician today and EMS was called due to the patient being severely dyspneic. EMS arrived and patient received albuterol treatment. His found to have a pulse oximetry of 94% room air. He was transported here for further management  Patient is a 82 y.o. male presenting with shortness of breath and cough. The history is provided by the patient and the EMS personnel.  Shortness of Breath Associated symptoms: cough   Cough Associated symptoms: shortness of breath     Past Medical History  Diagnosis Date  . Dizziness   . Diaphoresis   . Palpitations   . COPD (chronic obstructive pulmonary disease) ([place])   . Hypertension   . Hyperlipidemia   . GERD (gastroesophageal reflux disease)   . BPH (benign prostatic hypertrophy)   . Personal history of colonic polyps 07/08/2002,06/2011    tubular adenoma, hyperplastic  . Internal hemorrhoids without mention of complication   . Diverticulosis of colon (without mention of hemorrhage)   . Biliary cirrhosis ([place])   . Unspecified gastritis and gastroduodenitis without mention of hemorrhage   . NASH (nonalcoholic steatohepatitis)   . Anxiety   . Arthritis   . Ulcer     Gastric/bleeding  . Diabetes mellitus type 2, diet-controlled ([place])     managed with diet  . Vitamin B12 deficiency   . PAF  (paroxysmal atrial fibrillation) ([facility])     on Xarelto  . CAD (coronary artery disease)     prior abnormal Myoview; subsequent cath; managed medically  . Heart attack [facility])    Past Surgical History  Procedure Laterality Date  . Cataract extraction      bilateral  . Eye surgery      left eye x multiple  . Cardiac catheterization  March 2013    Managed medically   Family History  Problem Relation Age of Onset  . Heart failure Mother   . Diabetes Mother   . Diabetes Sister   . Heart failure Sister   . Colon cancer Neg Hx   . Cancer Mother     unknown type   Social History  Substance Use Topics  . Smoking status: Former Research scientist (life sciences)  . Smokeless tobacco: Former Systems developer    Quit date: 05/31/1996  . Alcohol Use: No    Review of Systems  Respiratory: Positive for cough and shortness of breath.   All other systems reviewed and are negative.     Allergies  Ciprofloxacin hcl and Penicillins  Home Medications   Prior to Admission medications   Medication Sig Start Date End Date Taking? Authorizing Provider  ALPRAZolam Duanne Moron) 0.5 MG tablet TAKE 1/2 TABLET AT BEDTIME, IF STILL UNABLE TO SLEEP TAKE THE OTHER 1/2 TABLET 08/02/15   [name], MD  cyanocobalamin (,VITAMIN B-12,) 1000 MCG/ML injection INJECT 1 ML (1,000 MCG TOTAL) INTO THE MUSCLE EVERY  30 (THIRTY) DAYS. 02/25/14   Inda Castle, MD  cyanocobalamin (,VITAMIN B-12,) 1000 MCG/ML injection INJECT 1 ML (1,000 MCG TOTAL) INTO THE MUSCLE EVERY 30 (THIRTY) DAYS. 04/10/15   [name], MD  diclofenac sodium (VOLTAREN) 1 % GEL APPLY 2 GRAMS TOPICALLY 3 TIMES DAILY 12/08/15   [name], MD  digoxin (LANOXIN) 0.125 MG tablet TAKE 1 TABLET BY MOUTH DAILY 12/20/15   Ezekiel Slocumb, PA-C  doxazosin (CARDURA) 4 MG tablet Take 1 tablet (4 mg total) by mouth at bedtime. 12/10/15   [name], MD  Fe-Succ Ac-C-Thre Ac-B12-FA (FERREX 150 FORTE PLUS) 50-100 MG CAPS Take 1 capsule by mouth daily. 11/19/12   Sable Feil, MD   finasteride (PROSCAR) 5 MG tablet Take 1 tablet (5 mg total) by mouth daily. 10/07/14   [name], MD  Fluticasone-Salmeterol (ADVAIR DISKUS) 250-50 MCG/DOSE AEPB Inhale 1 puff into the lungs as needed. Shortness of breath. 09/10/15   [name], MD  Ipratropium-Albuterol (COMBIVENT) 20-100 MCG/ACT AERS respimat Inhale 1 puff into the lungs every 6 (six) hours as needed for wheezing. 09/10/15   [name], MD  isosorbide mononitrate (IMDUR) 30 MG 24 hr tablet TAKE 1 TABLET (30 MG TOTAL) BY MOUTH DAILY. 10/07/14   [name], MD  Melatonin 3 MG CAPS Take 0.75 mg by mouth at bedtime as needed. Sleep     Historical Provider, MD  Omega-3 Fatty Acids (FISH OIL) 1000 MG CAPS Take 1,000 mg by mouth daily.     Historical Provider, MD  pantoprazole (PROTONIX) 40 MG tablet TAKE 1 TABLET (40 MG TOTAL) BY MOUTH DAILY. 04/01/15   Inda Castle, MD  predniSONE (DELTASONE) 1 MG tablet TAKE 4 TABLETS DAILY FOR DOSAGE OF 4 MG A DAY 12/29/15   [name], MD  Rivaroxaban (XARELTO) 15 MG TABS tablet TAKE 1 TABLET DAILY 10/07/14   [name], MD  simvastatin (ZOCOR) 40 MG tablet TAKE 1 TABLET BY MOUTH EVERY DAY AT BEDTIME 09/01/15   [name], MD  tamsulosin (FLOMAX) 0.4 MG CAPS capsule Take 1 capsule (0.4 mg total) by mouth daily after breakfast. 10/07/14   [name], MD  triamcinolone ointment (KENALOG) 0.1 % APPLY TO AFFECTED AREA TWICE A DAY 12/08/15   [name], MD  ursodiol (ACTIGALL) 250 MG tablet TAKE 1 TABLET BY MOUTH TWICE A DAY 11/03/15   [name], MD   SpO2 100% Physical Exam  Constitutional: He is oriented to person, place, and time. He appears well-developed and well-nourished.  Non-toxic appearance. No distress.  HENT:  Head: Normocephalic and atraumatic.  Eyes: Conjunctivae, EOM and lids are normal. Pupils are equal, round, and reactive to light.  Neck: Normal range of motion. Neck supple. No tracheal deviation present. No thyroid mass present.  Cardiovascular: Normal  rate, regular rhythm and normal heart sounds.  Exam reveals no gallop.   No murmur heard. Pulmonary/Chest: Effort normal. No stridor. No respiratory distress. He has decreased breath sounds. He has wheezes. He has no rhonchi. He has no rales.  Abdominal: Soft. Normal appearance and bowel sounds are normal. He exhibits no distension. There is no tenderness. There is no rebound and no CVA tenderness.  Musculoskeletal: Normal range of motion. He exhibits no edema or tenderness.  Neurological: He is alert and oriented to person, place, and time. He has normal strength. No cranial nerve deficit or sensory deficit. GCS eye subscore is 4. GCS verbal subscore  is 5. GCS motor subscore is 6.  Skin: Skin is warm and dry. No abrasion and no rash noted.  Psychiatric: He has a normal mood and affect. His speech is normal and behavior is normal.  Nursing note and vitals reviewed.   ED Course  Procedures (including critical care time) Labs Review Labs Reviewed  COMPREHENSIVE METABOLIC PANEL  CBC WITH DIFFERENTIAL/PLATELET  TROPONIN I  BRAIN NATRIURETIC PEPTIDE  DIGOXIN LEVEL    Imaging Review Dg Chest 2 View  12/31/2015  CLINICAL DATA:  Cough and wheezing EXAM: CHEST  2 VIEW COMPARISON:  02/25/2014 FINDINGS: Cardiac shadow is within normal limits. Aortic calcifications are again seen. The lungs are hyperinflated consistent with COPD. No focal infiltrate or sizable effusion is noted. Some scarring is noted bilaterally. Degenerative change of the thoracic spine is noted. IMPRESSION: COPD without acute abnormality. Electronically Signed   By: Inez Catalina M.D.   On: 12/31/2015 16:03   I have personally reviewed and evaluated these images and lab results as part of my medical decision-making.   EKG Interpretation   Date/Time:  Thursday December 31 2015 17:10:07 EST Ventricular Rate:  58 PR Interval:  215 QRS Duration: 105 QT Interval:  422 QTC Calculation: 414 R Axis:   -45 Text Interpretation:  Sinus  rhythm Borderline prolonged PR interval LAD,  consider left anterior fascicular block Confirmed by Davion Meara  MD, Nicha Hemann  ([id number]) on 12/31/2015 5:49:03 PM      MDM   Final diagnoses:  None   patient given slight Medrol along with albuterol treatment. Wheezing remains. Will be admitted for COPD exacerbation    Lacretia Leigh, MD 12/31/15 1950

## 2015-12-31 NOTE — Progress Notes (Signed)
New Admission Note:   Arrival: From ED with NT on stretcher Mental Orientation: Alert and oriented to self, place, situation.  Disoriented to time; stated it was in the 1970s. Telemetry: Placed on box 6e21 Assessment:  See doc flowsheet Skin: Intact.  Bruises on both arms. IV: Left FA IV Pain: None at this time. Safety Measures:  Call bell placed within reach; patient instructed on use of call bell and verbalized understanding. Bed in lowest position. Non-skid socks on.  Bed alarm on. [address] Orientation: Patient oriented to staff, room, and unit. Family: None at bedside  Orders have been reviewed and implemented. Cannot complete admission questions due to patient's confusion. Will continue to monitor.  [name], RN, BSN

## 2015-12-31 NOTE — Progress Notes (Addendum)
Patient ID: Cory Lawrence, male   DOB: Aug 29, 1925, 81 y.o.   MRN: [id number]    By signing my name below, I, Cory Lawrence, attest that this documentation has been prepared under the direction and in the presence of [name], MD Electronically Signed: [name], ED Scribe 12/31/2015 at 4:03 PM.  Chief Complaint:  Chief Complaint  Patient presents with  . Cough    several weeks  . Fatigue   HPI: Cory Lawrence is a 82 y.o. male, with a h/o COPD, who reports to [facility] today complaining of persistently productive cough with minimally discolored sputum for the past week. Pt's wife states that cough keeps him up at night. Pt reports associated fatigue. He has used his Combivent and Advair inhalers with minimal relief. Pt denies fever or chest pain. He is a former smoker; quit in 1993. Pt had a chest CT 1.5 year ago that showed Emphysema. Patient has been very unsteady in his gait over the past few days. He is nearly fallen the last few days.    Pt had a cystoscopy 2 days ago; normal.  Past Medical History  Diagnosis Date  . Dizziness   . Diaphoresis   . Palpitations   . COPD (chronic obstructive pulmonary disease) ([place])   . Hypertension   . Hyperlipidemia   . GERD (gastroesophageal reflux disease)   . BPH (benign prostatic hypertrophy)   . Personal history of colonic polyps 07/08/2002,06/2011    tubular adenoma, hyperplastic  . Internal hemorrhoids without mention of complication   . Diverticulosis of colon (without mention of hemorrhage)   . Biliary cirrhosis ()   . Unspecified gastritis and gastroduodenitis without mention of hemorrhage   . NASH (nonalcoholic steatohepatitis)   . Anxiety   . Arthritis   . Ulcer     Gastric/bleeding  . Diabetes mellitus type 2, diet-controlled ([place])     managed with diet  . Vitamin B12 deficiency   . PAF (paroxysmal atrial fibrillation) (HCC)     on Xarelto  . CAD (coronary artery disease)     prior abnormal Myoview;  subsequent cath; managed medically  . Heart attack [facility])    Past Surgical History  Procedure Laterality Date  . Cataract extraction      bilateral  . Eye surgery      left eye x multiple  . Cardiac catheterization  March 2013    Managed medically   Social History   Social History  . Marital Status: Married    Spouse Name: N/A  . Number of Children: 2  . Years of Education: N/A   Occupational History  . retired    Social History Main Topics  . Smoking status: Former Research scientist (life sciences)  . Smokeless tobacco: Former Systems developer    Quit date: 05/31/1996  . Alcohol Use: No  . Drug Use: No  . Sexual Activity: No   Other Topics Concern  . None   Social History Narrative   Family History  Problem Relation Age of Onset  . Heart failure Mother   . Diabetes Mother   . Diabetes Sister   . Heart failure Sister   . Colon cancer Neg Hx   . Cancer Mother     unknown type   Allergies  Allergen Reactions  . Ciprofloxacin Hcl Swelling  . Penicillins Hives   Prior to Admission medications   Medication Sig Start Date End Date Taking? Authorizing Provider  ALPRAZolam (XANAX) 0.5 MG tablet TAKE 1/2 TABLET AT BEDTIME, IF  STILL UNABLE TO SLEEP TAKE THE OTHER 1/2 TABLET 08/02/15  Yes [name], MD  cyanocobalamin (,VITAMIN B-12,) 1000 MCG/ML injection INJECT 1 ML (1,000 MCG TOTAL) INTO THE MUSCLE EVERY 30 (THIRTY) DAYS. 02/25/14  Yes Inda Castle, MD  cyanocobalamin (,VITAMIN B-12,) 1000 MCG/ML injection INJECT 1 ML (1,000 MCG TOTAL) INTO THE MUSCLE EVERY 30 (THIRTY) DAYS. 04/10/15  Yes [name], MD  diclofenac sodium (VOLTAREN) 1 % GEL APPLY 2 GRAMS TOPICALLY 3 TIMES DAILY 12/08/15  Yes [name], MD  digoxin (LANOXIN) 0.125 MG tablet TAKE 1 TABLET BY MOUTH DAILY 12/20/15  Yes Bennett Scrape V, PA-C  doxazosin (CARDURA) 4 MG tablet Take 1 tablet (4 mg total) by mouth at bedtime. 12/10/15  Yes [name], MD  Fe-Succ Ac-C-Thre Ac-B12-FA (FERREX 150 FORTE PLUS) 50-100 MG CAPS Take 1 capsule by  mouth daily. 11/19/12  Yes Sable Feil, MD  finasteride (PROSCAR) 5 MG tablet Take 1 tablet (5 mg total) by mouth daily. 10/07/14  Yes [name], MD  Fluticasone-Salmeterol (ADVAIR DISKUS) 250-50 MCG/DOSE AEPB Inhale 1 puff into the lungs as needed. Shortness of breath. 09/10/15  Yes [name], MD  Ipratropium-Albuterol (COMBIVENT) 20-100 MCG/ACT AERS respimat Inhale 1 puff into the lungs every 6 (six) hours as needed for wheezing. 09/10/15  Yes [name], MD  isosorbide mononitrate (IMDUR) 30 MG 24 hr tablet TAKE 1 TABLET (30 MG TOTAL) BY MOUTH DAILY. 10/07/14  Yes [name], MD  Melatonin 3 MG CAPS Take 0.75 mg by mouth at bedtime as needed. Sleep    Yes Historical Provider, MD  Omega-3 Fatty Acids (FISH OIL) 1000 MG CAPS Take 1,000 mg by mouth daily.    Yes Historical Provider, MD  pantoprazole (PROTONIX) 40 MG tablet TAKE 1 TABLET (40 MG TOTAL) BY MOUTH DAILY. 04/01/15  Yes Inda Castle, MD  predniSONE (DELTASONE) 1 MG tablet TAKE 4 TABLETS DAILY FOR DOSAGE OF 4 MG A DAY 12/29/15  Yes [name], MD  Rivaroxaban (XARELTO) 15 MG TABS tablet TAKE 1 TABLET DAILY 10/07/14  Yes [name], MD  simvastatin (ZOCOR) 40 MG tablet TAKE 1 TABLET BY MOUTH EVERY DAY AT BEDTIME 09/01/15  Yes [name], MD  tamsulosin (FLOMAX) 0.4 MG CAPS capsule Take 1 capsule (0.4 mg total) by mouth daily after breakfast. 10/07/14  Yes [name], MD  triamcinolone ointment (KENALOG) 0.1 % APPLY TO AFFECTED AREA TWICE A DAY 12/08/15  Yes [name], MD  ursodiol (ACTIGALL) 250 MG tablet TAKE 1 TABLET BY MOUTH TWICE A DAY 11/03/15  Yes [name], MD   ROS: The patient denies -fevers, chills, night sweats, unintentional weight loss, -chest pain, palpitations, wheezing, dyspnea on exertion, nausea, vomiting, abdominal pain, dysuria, hematuria, melena, numbness, weakness, or tingling. +fatigue, +cough  All other systems have been reviewed and were otherwise negative with the exception of those  mentioned in the HPI and as above.    PHYSICAL EXAM: Filed Vitals:   12/31/15 1447  BP: 104/58  Pulse: 66  Temp: 97.7 F (36.5 C)  Resp: 18   Body mass index is 22.08 kg/(m^2).  General: Alert, no acute distress HEENT:  Normocephalic, atraumatic, oropharynx patent. Eye: [facility] Cardiovascular: Regular rate and rhythm, no rubs murmurs or gallops. No Carotid bruits, radial pulse intact. No pedal edema.  Respiratory: Very diminished breath sounds in both bases with poor air exchange.  Abdominal: No organomegaly, abdomen is soft and  non-tender, positive bowel sounds. No masses. Musculoskeletal: Gait intact. No edema, tenderness Skin: No rashes. Neurologic: Facial musculature symmetric. Patient has a significant bilateral tremor. Psychiatric: Patient acts appropriately throughout our interaction. Lymphatic: No cervical or submandibular lymphadenopathy  LABS:  EKG/XRAY:   Primary read interpreted by Dr. Everlene Farrier at [facility].  Dg Chest 2 View  12/31/2015  CLINICAL DATA:  Cough and wheezing EXAM: CHEST  2 VIEW COMPARISON:  02/25/2014 FINDINGS: Cardiac shadow is within normal limits. Aortic calcifications are again seen. The lungs are hyperinflated consistent with COPD. No focal infiltrate or sizable effusion is noted. Some scarring is noted bilaterally. Degenerative change of the thoracic spine is noted. IMPRESSION: COPD without acute abnormality. Electronically Signed   By: Inez Catalina M.D.   On: 12/31/2015 16:03   ASSESSMENT/PLAN: Patient with a history of polymyalgia rheumatica. He is currently on prednisone 4 mg daily. He presents with 10 days of progressive shortness of breath and cough. He has a history of COPD but has not smoked for many years. He is currently on Advair and Combivent. He has been using these sporadically. He denies any fever or chills. His shortness of breath has become progressively worse. Most likely this is a flare of his known COPD. He would be considered  immunocompromised due to his chronic prednisone therapy of 4 mg a day. Patient was extremely unstable 1 trying to ambulate. He will be sent to the emergency room via EMS for their further evaluation of his COPD and probable admission. He did have significant pursing of his lips while waiting in the exam room. He received one treatment while in the office.    Gross sideeffects, risk and benefits, and alternatives of medications d/w patient. Patient is aware that all medications have potential sideeffects and we are unable to predict every sideeffect or drug-drug interaction that may occur.  [name] MD 12/31/2015 3:03 PM

## 2015-12-31 NOTE — Patient Instructions (Signed)
Because you received an x-ray today, you will receive an invoice from [facility]. Please contact [facility] at [phone] with questions or concerns regarding your invoice. Our billing staff will not be able to assist you with those questions. °

## 2015-12-31 NOTE — ED Notes (Signed)
Pt arrived via EMS c/o SOB, cough x10 days, coughing yellow mucous

## 2015-12-31 NOTE — H&P (Signed)
[facility] Admission History and Physical Service Pager: [phone]  Patient name: Cory Lawrence Medical record number: [id number] Date of birth: 14-Nov-1924 Age: 82 y.o. Gender: male  Primary Care Provider: [name], MD Consultants: none Code Status: full  Chief Complaint: SOB  Assessment and Plan: Cory Lawrence is a 82 y.o. male presenting with SOB x1 day. PMH is significant for Hyperlipidemia, hypertension, COPD, CAD, primary biliary cirrhosis, BPH, GERD, type 2 diabetes, A. Fib, anemia, vitamin B12 deficiency.  # COPD with exacerbation: Patient presenting with acute worsening in shortness of breath over the past 24-48 hours. Endorses new sputum production. Denies fevers. Denies chest pain, diaphoresis, or headache. Previous smoker for >30 yrs. Quit smoking approximately 20 years ago. No sick contacts. Not tachypnic on exam. Diffuse wheezes in bilateral lung fields with prolonged expiratory phase. Satting 98% on room air. Heart rate 63. Blood pressure slightly elevated. No peripheral edema or JVD. - Patient to be brought in for observation; attending physician Dr. Gwendlyn Deutscher - telemetry; due to patient's cardiac history - Supplemental O2 as needed. - Albuterol nebulizer every 6 hours and every 2 hours when necessary for wheezing - initiate Spiriva daily - S/p Solu-Medrol in ED; begin prednisone 50 mg daily tomorrow - Start Protonix 40 mg daily for gastric ulcer prophylaxis (2/2 steroid use) - S/p 1 dose of azithromycin - doxycycline IV overnight >> Transition to by mouth tomorrow - IV saline lock - pulse ox with vital signs - PT/OT to assess patient's ability to ambulate in the morning  # CAD - continue home Imdur  # H/o A Fib - continue home digoxin - Digoxin level today was 1.1 - Xarelto per pharmacy (patient is on this at home)  # HTN - continue medications as above.  # Primary biliary cirrhosis - continue home Ursodiol -  avoid hepatotoxic agents  # BPH - continue home medications: tamsulosin, finasteride, doxazosin  # Type 2 diabetes: diet-controlled. Last A1c 06/22/15 was 5.3. - Obtain A1c - SSI sensitive   FEN/GI: heart healthy/carb modified. IV saline lock Prophylaxis: Xarelto per pharmacy  Disposition: pending medical improvement  History of Present Illness:  Cory Lawrence is a 82 y.o. male presenting with progressively worsening shortness of breath over the past 24-48 hours. Patient states that he had noticed some shortness of breath over the past week. He states that this did not seem to bothering him until about 24 hours ago. He states that at that time he started to notice a progressive cough with some thick yellow sputum production. Patient denies any fevers chills nausea vomiting or diarrhea at that time. He states that when he was unable to perform the typical chores that he performs at home he decided he should be seen by his PCP. Patient's PCP asked that he be reevaluated at the [facility] emergency department. Chest x-ray was clear, without consolidation or evidence of congestion, and patient was satting in the 90s on room air. The ED physician decided to admit patient due to his high risk history and age.patient has a long history of tobacco use dating from when he was about 82 years old until he was about 82 years old.he states that he quit smoking over 2 decades ago and has not had much issue with breathing until more recently. He denies any chest pain, headache, dizziness, arm/jaw numbness/pain, vision changes, focal weakness, confusion, loss of consciousness, hemoptysis.  In the ED patient was provided one dose of Solu-Medrol as well as  2 nebulized breathing treatments. According to the ED staff he was walked but did not drop his O2 saturations. Patient will be brought in for observation for an acute COPD exacerbation.  Review Of Systems: Per HPI Otherwise the remainder of the systems  were negative.  Patient Active Problem List   Diagnosis Date Noted  . COPD exacerbation ([place]) 12/31/2015  . OA (osteoarthritis) 12/02/2015  . ESR raised 07/22/2014  . Anemia 02/18/2014  . Weight loss 12/11/2013  . LV dysfunction 03/02/2012  . Atrial Fibrillation 02/22/2012  . Vitamin B12 deficiency 09/29/2011  . COPD with bronchitis 09/29/2011  . History of gastroesophageal reflux (GERD) 09/29/2011  . Diabetes mellitus type 2, diet-controlled ([place])   . Other B-complex deficiencies 05/24/2011  . Other general symptoms  05/17/2011  . NASH (nonalcoholic steatohepatitis) 05/17/2011  . Cirrhosis of liver not due to alcohol  05/17/2011  . Personal history of colonic polyps 05/17/2011  . CAD (coronary artery disease) 03/17/2011  . Syncope and collapse   . Dizziness   . Diaphoresis   . Chest pain   . Chest tightness   . Palpitations   . COPD (chronic obstructive pulmonary disease) ([place])   . Hypertension   . GERD (gastroesophageal reflux disease)   . BPH (benign prostatic hypertrophy)   . HYPERLIPIDEMIA 11/19/2007  . OBESITY 11/19/2007  . HYPERTENSION 11/19/2007  . INTERNAL HEMORRHOIDS 11/19/2007  . COPD 11/19/2007  . GERD 11/19/2007  . DIVERTICULOSIS, COLON 11/19/2007  . BILIARY CIRRHOSIS, PRIMARY 11/19/2007  . BENIGN PROSTATIC HYPERTROPHY, HX OF 11/19/2007  . COLONIC POLYPS, ADENOMATOUS 07/08/2002    Past Medical History: Past Medical History  Diagnosis Date  . Dizziness   . Diaphoresis   . Palpitations   . COPD (chronic obstructive pulmonary disease) ([place])   . Hypertension   . Hyperlipidemia   . GERD (gastroesophageal reflux disease)   . BPH (benign prostatic hypertrophy)   . Personal history of colonic polyps 07/08/2002,06/2011    tubular adenoma, hyperplastic  . Internal hemorrhoids without mention of complication   . Diverticulosis of colon (without mention of hemorrhage)   . Biliary cirrhosis ([place])   . Unspecified gastritis and gastroduodenitis without mention of  hemorrhage   . NASH (nonalcoholic steatohepatitis)   . Anxiety   . Arthritis   . Ulcer     Gastric/bleeding  . Diabetes mellitus type 2, diet-controlled ([place])     managed with diet  . Vitamin B12 deficiency   . PAF (paroxysmal atrial fibrillation) (HCC)     on Xarelto  . CAD (coronary artery disease)     prior abnormal Myoview; subsequent cath; managed medically  . Heart attack [facility])     Past Surgical History: Past Surgical History  Procedure Laterality Date  . Cataract extraction      bilateral  . Eye surgery      left eye x multiple  . Cardiac catheterization  March 2013    Managed medically    Social History: Social History  Substance Use Topics  . Smoking status: Former Research scientist (life sciences)  . Smokeless tobacco: Former Systems developer    Quit date: 05/31/1996  . Alcohol Use: No   Additional social history: none  Please also refer to relevant sections of EMR.  Family History: Family History  Problem Relation Age of Onset  . Heart failure Mother   . Diabetes Mother   . Diabetes Sister   . Heart failure Sister   . Colon cancer Neg Hx   . Cancer Mother  unknown type    Allergies and Medications: Allergies  Allergen Reactions  . Ciprofloxacin Hcl Swelling  . Penicillins Hives    Has patient had a PCN reaction causing immediate rash, facial/tongue/throat swelling, SOB or lightheadedness with hypotension: YES Has patient had a PCN reaction causing severe rash involving mucus membranes or skin necrosis: NO Has patient had a PCN reaction that required hospitalization NO Has patient had a PCN reaction occurring within the last 10 years: NO If all of the above answers are "NO", then may proceed with Cephalosporin use.   No current facility-administered medications on file prior to encounter.   Current Outpatient Prescriptions on File Prior to Encounter  Medication Sig Dispense Refill  . ALPRAZolam (XANAX) 0.5 MG tablet TAKE 1/2 TABLET AT BEDTIME, IF STILL UNABLE TO SLEEP TAKE THE  OTHER 1/2 TABLET 30 tablet 5  . cyanocobalamin (,VITAMIN B-12,) 1000 MCG/ML injection INJECT 1 ML (1,000 MCG TOTAL) INTO THE MUSCLE EVERY 30 (THIRTY) DAYS. 10 mL 6  . digoxin (LANOXIN) 0.125 MG tablet TAKE 1 TABLET BY MOUTH DAILY 90 tablet 0  . doxazosin (CARDURA) 4 MG tablet Take 1 tablet (4 mg total) by mouth at bedtime. 90 tablet 1  . Fe-Succ Ac-C-Thre Ac-B12-FA (FERREX 150 FORTE PLUS) 50-100 MG CAPS Take 1 capsule by mouth daily. 90 each 3  . finasteride (PROSCAR) 5 MG tablet Take 1 tablet (5 mg total) by mouth daily. 90 tablet 3  . Fluticasone-Salmeterol (ADVAIR DISKUS) 250-50 MCG/DOSE AEPB Inhale 1 puff into the lungs as needed. Shortness of breath. 60 each 11  . Ipratropium-Albuterol (COMBIVENT) 20-100 MCG/ACT AERS respimat Inhale 1 puff into the lungs every 6 (six) hours as needed for wheezing. 1 Inhaler 11  . isosorbide mononitrate (IMDUR) 30 MG 24 hr tablet TAKE 1 TABLET (30 MG TOTAL) BY MOUTH DAILY. 90 tablet 3  . Melatonin 3 MG CAPS Take 0.75 mg by mouth at bedtime as needed. Sleep     . Rivaroxaban (XARELTO) 15 MG TABS tablet TAKE 1 TABLET DAILY (Patient taking differently: Take 15 mg by mouth daily. ) 90 tablet 3  . tamsulosin (FLOMAX) 0.4 MG CAPS capsule Take 1 capsule (0.4 mg total) by mouth daily after breakfast. 90 capsule 3  . triamcinolone ointment (KENALOG) 0.1 % APPLY TO AFFECTED AREA TWICE A DAY 30 g 5  . ursodiol (ACTIGALL) 250 MG tablet TAKE 1 TABLET BY MOUTH TWICE A DAY 180 tablet 1  . cyanocobalamin (,VITAMIN B-12,) 1000 MCG/ML injection INJECT 1 ML (1,000 MCG TOTAL) INTO THE MUSCLE EVERY 30 (THIRTY) DAYS. (Patient not taking: Reported on 12/31/2015) 10 mL 2  . diclofenac sodium (VOLTAREN) 1 % GEL APPLY 2 GRAMS TOPICALLY 3 TIMES DAILY (Patient not taking: Reported on 12/31/2015) 100 g 5  . pantoprazole (PROTONIX) 40 MG tablet TAKE 1 TABLET (40 MG TOTAL) BY MOUTH DAILY. (Patient not taking: Reported on 12/31/2015) 90 tablet 3  . predniSONE (DELTASONE) 1 MG tablet TAKE 4 TABLETS  DAILY FOR DOSAGE OF 4 MG A DAY (Patient not taking: Reported on 12/31/2015) 100 tablet 1  . simvastatin (ZOCOR) 40 MG tablet TAKE 1 TABLET BY MOUTH EVERY DAY AT BEDTIME (Patient not taking: Reported on 12/31/2015) 30 tablet 0    Objective: BP 179/62 mmHg  Pulse 63  Temp(Src) 98 F (36.7 C) (Oral)  Resp 18  SpO2 98% Exam: General -- oriented x2 (patient had difficulty remembering the date and year), pleasant and cooperative. HEENT -- Head is normocephalic. PERRLA. EOMI. Ears, nose and throat were benign.  MMM Neck -- supple; no bruits. No JVD Integument -- intact. No rash, erythema, or ecchymoses.  Chest -- good expansion. Prolonged expiratory phase with diffuse wheezes in all lung fields. Cardiac -- RRR. No murmurs noted. Extremely distant heart sounds. Abdomen -- soft, nontender. No masses palpable. Bowel sounds present. CNS -- cranial nerves II through XII grossly intact. 2+ reflexes bilaterally. Tremor noted, full body Extremeties - no tenderness or effusions noted. ROM good. 5/5 bilateral strength. Dorsalis pedis pulses present and symmetrical. No edema.    Labs and Imaging: CBC BMET   Recent Labs Lab 12/31/15 1720  WBC 3.8*  HGB 10.1*  HCT 30.2*  PLT 87*    Recent Labs Lab 12/31/15 1720  NA 141  K 4.7  CL 106  CO2 25  BUN 41*  CREATININE 1.37*  GLUCOSE 120*  CALCIUM 8.5*      Elberta Leatherwood, MD 01/01/2016, 12:22 AM PGY-2, [place] [facility] pager: [phone], text pages welcome

## 2015-12-31 NOTE — ED Notes (Signed)
Attempted report 

## 2016-01-01 DIAGNOSIS — J441 Chronic obstructive pulmonary disease with (acute) exacerbation: Secondary | ICD-10-CM | POA: Diagnosis not present

## 2016-01-01 DIAGNOSIS — E118 Type 2 diabetes mellitus with unspecified complications: Secondary | ICD-10-CM | POA: Insufficient documentation

## 2016-01-01 DIAGNOSIS — I10 Essential (primary) hypertension: Secondary | ICD-10-CM

## 2016-01-01 DIAGNOSIS — R0602 Shortness of breath: Secondary | ICD-10-CM | POA: Insufficient documentation

## 2016-01-01 LAB — BASIC METABOLIC PANEL
Anion gap: 12 (ref 5–15)
Anion gap: 13 (ref 5–15)
BUN: 40 mg/dL — AB (ref 6–20)
BUN: 47 mg/dL — AB (ref 6–20)
CALCIUM: 8.2 mg/dL — AB (ref 8.9–10.3)
CALCIUM: 8.4 mg/dL — AB (ref 8.9–10.3)
CO2: 19 mmol/L — AB (ref 22–32)
CO2: 21 mmol/L — ABNORMAL LOW (ref 22–32)
CREATININE: 1.49 mg/dL — AB (ref 0.61–1.24)
Chloride: 105 mmol/L (ref 101–111)
Chloride: 103 mmol/L (ref 101–111)
Creatinine, Ser: 1.44 mg/dL — ABNORMAL HIGH (ref 0.61–1.24)
GFR calc Af Amer: 48 mL/min — ABNORMAL LOW (ref >60)
GFR calc Af Amer: 46 mL/min — ABNORMAL LOW (ref >60)
GFR, EST NON AFRICAN AMERICAN: 42 mL/min — AB (ref >60)
GFR, EST NON AFRICAN AMERICAN: 40 mL/min — AB (ref >60)
GLUCOSE: 139 mg/dL — AB (ref 65–99)
GLUCOSE: 169 mg/dL — AB (ref 65–99)
Potassium: 4.5 mmol/L (ref 3.5–5.1)
Potassium: 4.5 mmol/L (ref 3.5–5.1)
SODIUM: 137 mmol/L (ref 135–145)
Sodium: 136 mmol/L (ref 135–145)

## 2016-01-01 LAB — GLUCOSE, CAPILLARY
GLUCOSE-CAPILLARY: 134 mg/dL — AB (ref 65–99)
GLUCOSE-CAPILLARY: 123 mg/dL — AB (ref 65–99)
Glucose-Capillary: 154 mg/dL — ABNORMAL HIGH (ref 65–99)
Glucose-Capillary: 129 mg/dL — ABNORMAL HIGH (ref 65–99)

## 2016-01-01 LAB — HEMOGLOBIN A1C
HEMOGLOBIN A1C: 5.7 % — AB (ref <5.7)
Mean Plasma Glucose: 117 mg/dL — ABNORMAL HIGH (ref <117)

## 2016-01-01 LAB — SEDIMENTATION RATE: Sed Rate: 26 mm/hr — ABNORMAL HIGH (ref 0–20)

## 2016-01-01 MED ORDER — SODIUM CHLORIDE 0.9 % IV BOLUS (SEPSIS)
500.0000 mL | Freq: Once | INTRAVENOUS | Status: AC
  Administered 2016-01-01: 500 mL via INTRAVENOUS

## 2016-01-01 MED ORDER — DOXYCYCLINE HYCLATE 100 MG PO TABS
100.0000 mg | ORAL_TABLET | Freq: Two times a day (BID) | ORAL | Status: DC
  Administered 2016-01-01 – 2016-01-02 (×3): 100 mg via ORAL
  Filled 2016-01-01 (×3): qty 1

## 2016-01-01 MED ORDER — INSULIN ASPART 100 UNIT/ML ~~LOC~~ SOLN
0.0000 [IU] | Freq: Three times a day (TID) | SUBCUTANEOUS | Status: DC
  Administered 2016-01-01 (×2): 1 [IU] via SUBCUTANEOUS
  Administered 2016-01-01: 2 [IU] via SUBCUTANEOUS
  Administered 2016-01-02 (×2): 1 [IU] via SUBCUTANEOUS

## 2016-01-01 MED ORDER — HALOPERIDOL LACTATE 5 MG/ML IJ SOLN
0.5000 mg | Freq: Once | INTRAMUSCULAR | Status: AC
Start: 2016-01-01 — End: 2016-01-01
  Administered 2016-01-01: 0.5 mg via INTRAVENOUS
  Filled 2016-01-01: qty 1

## 2016-01-01 MED ORDER — SODIUM CHLORIDE 0.9 % IV SOLN
INTRAVENOUS | Status: DC
  Administered 2016-01-01: 23:00:00 via INTRAVENOUS

## 2016-01-01 NOTE — Evaluation (Signed)
Physical Therapy Evaluation Patient Details Name: Cory Lawrence MRN: [id number] DOB: [birth year] Today's Date: 01/01/2016   History of Present Illness  82 Y/O m with pmx of COPD, DM2, HTN, CAD, NASH, billiary cirrhosis, HLD, diverticulosis presented with hx of SOB, cough and increase sputum production. Admitted for mild COPD exacerbation  Clinical Impression  Pt admitted with above complications. Pt currently with functional limitations due to the deficits listed below (see PT Problem List). Demonstrates intermittent instability associated with moderate tremors. SpO2 maintained 93-97% on room air. Agrees to use rolling walker at home and would benefit from HHPT follow-up. Safely navigated steps today without physical assist. Pt will benefit from skilled PT to increase their independence and safety with mobility to allow discharge to the venue listed below.       Follow Up Recommendations Home health PT;Supervision for mobility/OOB    Equipment Recommendations  None recommended by PT    Recommendations for Other Services       Precautions / Restrictions Precautions Precautions: Fall Restrictions Weight Bearing Restrictions: No      Mobility  Bed Mobility               General bed mobility comments: Standing with nurse tech when PT entered room  Transfers                 General transfer comment: standing with nurse tech when PT entered room. Good control with descent into recliner at end of session.  Ambulation/Gait Ambulation/Gait assistance: Min guard Ambulation Distance (Feet): 225 Feet Assistive device: Rolling walker (2 wheeled);None Gait Pattern/deviations: Step-through pattern;Decreased stride length;Staggering right;Trunk flexed Gait velocity: decreased   General Gait Details: Rather tremulous, contributing to instability. Ambulates half distance without assistive device demonstrating occasional stagger but able to self correct with close guard  provided for safety. Stability improves notably with use of a rolling walker. Educated on proper use for safety with this device. No buckling noted, no physical assist required to correct balance.  Stairs Stairs: Yes Stairs assistance: Min guard Stair Management: Two rails;Step to pattern;Forwards Number of Stairs: 5 General stair comments: Min guard for safety. VC to simulate rail use similar to home environment and for sequencing with step-to pattern. Performed without loss of balance  Wheelchair Mobility    Modified Rankin (Stroke Patients Only)       Balance Overall balance assessment: Needs assistance Sitting-balance support: No upper extremity supported;Feet supported Sitting balance-Leahy Scale: Normal     Standing balance support: No upper extremity supported Standing balance-Leahy Scale: Fair                               Pertinent Vitals/Pain Pain Assessment: No/denies pain    Home Living Family/patient expects to be discharged to:: Private residence Living Arrangements: Spouse/significant other Available Help at Discharge: Family;Available 24 hours/day Type of Home: House Home Access: Stairs to enter Entrance Stairs-Rails: Psychiatric nurse of Steps: 4 Home Layout: One level Home Equipment: Walker - 2 wheels;Cane - single point      Prior Function Level of Independence: Independent         Comments: 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 SpO2  maintained 93-97% on room air during ambulation, HR up to 120, resting in the 90s. Moderate dyspnea, cues for pused lip breathing.    Exercises        Assessment/Plan    PT Assessment Patient needs continued PT services  PT Diagnosis Difficulty walking;Abnormality of gait;Generalized weakness   PT Problem List Decreased strength;Decreased activity tolerance;Decreased balance;Decreased mobility;Decreased knowledge of use of DME  PT Treatment Interventions DME instruction;Gait training;Functional mobility training;Therapeutic activities;Therapeutic exercise;Balance training;Neuromuscular re-education;Patient/family education   PT Goals (Current goals can be found in the Care Plan section) Acute Rehab PT Goals Patient Stated Goal: Go home PT Goal Formulation: With patient Time For Goal Achievement: 01/15/16 Potential to Achieve Goals: Good    Frequency Min 3X/week   Barriers to discharge        Co-evaluation               End of Session Equipment Utilized During Treatment: Gait belt Activity Tolerance: Patient tolerated treatment well Patient left: in chair;with call bell/phone within reach;Other (comment) (MD in room evaluating pt.) Nurse Communication: Mobility status    Functional Assessment Tool Used: clinical observation Functional Limitation: Mobility: Walking and moving around Mobility: Walking and Moving Around Current Status [id number]): At least 1 percent but less than 20 percent impaired, limited or restricted Mobility: Walking and Moving Around Goal Status [phone]): At least 1 percent but less than 20 percent impaired, limited or restricted    Time: 0901-0915 PT Time Calculation (min) (ACUTE ONLY): 14 min   Charges:   PT Evaluation $PT Eval Low Complexity: 1 Procedure     PT G Codes:   PT G-Codes **NOT FOR INPATIENT CLASS** Functional Assessment Tool Used: clinical observation Functional Limitation: Mobility: Walking and moving around Mobility: Walking  and Moving Around Current Status [id number]): At least 1 percent but less than 20 percent impaired, limited or restricted Mobility: Walking and Moving Around Goal Status [phone]): At least 1 percent but less than 20 percent impaired, limited or restricted    Ellouise Newer 01/01/2016, 9:41 AM  Elayne Snare, [place]

## 2016-01-01 NOTE — Progress Notes (Addendum)
ANTICOAGULATION CONSULT NOTE - Initial Consult  Pharmacy Consult for Xarelto  Indication: NVAF  Allergies  Allergen Reactions  . Ciprofloxacin Hcl Swelling  . Penicillins Hives    Has patient had a PCN reaction causing immediate rash, facial/tongue/throat swelling, SOB or lightheadedness with hypotension: YES Has patient had a PCN reaction causing severe rash involving mucus membranes or skin necrosis: NO Has patient had a PCN reaction that required hospitalization NO Has patient had a PCN reaction occurring within the last 10 years: NO If all of the above answers are "NO", then may proceed with Cephalosporin use.    Vital Signs: Temp: 98 F (36.7 C) (03/02 2127) Temp Source: Oral (03/02 2127) BP: 179/62 mmHg (03/02 2127) Pulse Rate: 63 (03/02 2127)  Labs:  Recent Labs  12/31/15 1454 12/31/15 1720  HGB 10.4* 10.1*  HCT 31.2* 30.2*  PLT 99* 87*  CREATININE 1.20* 1.37*  TROPONINI  --  <0.03    Estimated Creatinine Clearance: 29.8 mL/min (by C-G formula based on Cr of 1.37).   Medical History: Past Medical History  Diagnosis Date  . Dizziness   . Diaphoresis   . Palpitations   . COPD (chronic obstructive pulmonary disease) ([place])   . Hypertension   . Hyperlipidemia   . GERD (gastroesophageal reflux disease)   . BPH (benign prostatic hypertrophy)   . Personal history of colonic polyps 07/08/2002,06/2011    tubular adenoma, hyperplastic  . Internal hemorrhoids without mention of complication   . Diverticulosis of colon (without mention of hemorrhage)   . Biliary cirrhosis ([place])   . Unspecified gastritis and gastroduodenitis without mention of hemorrhage   . NASH (nonalcoholic steatohepatitis)   . Anxiety   . Arthritis   . Ulcer     Gastric/bleeding  . Diabetes mellitus type 2, diet-controlled ([place])     managed with diet  . Vitamin B12 deficiency   . PAF (paroxysmal atrial fibrillation) ([facility])     on Xarelto  . CAD (coronary artery disease)     prior abnormal  Myoview; subsequent cath; managed medically  . Heart attack [facility])    Assessment: Continuing PTA Xarelto for non-valvular afib. Hgb 10.1. CrCl ~30. Other labs reviewed.   Goal of Therapy:  Monitor platelets by anticoagulation protocol: Yes   Plan:  -Xarelto 15 mg daily with supper -Minimum q72h CBC while inpatient  -Monitor for bleeding  Narda Bonds 01/01/2016,12:15 AM   ADDENDUM Pharmacy will continue to follow peripherally. Consult d/c'd.  CBC ordered as above  Ashanti Littles D. Noach Calvillo, PharmD, BCPS Clinical Pharmacist Pager: [phone] 01/01/2016 11:42 AM

## 2016-01-01 NOTE — Care Management Obs Status (Signed)
[place] NOTIFICATION   Patient Details  Name: Chritopher Umana MRN: [id number] Date of Birth: 22-Feb-1925   Medicare Observation Status Notification Given:  Yes    Agam Davenport, Rory Percy, RN 01/01/2016, 2:45 PM

## 2016-01-01 NOTE — Progress Notes (Signed)
Called patient's wife [name] at [phone]. She clarified that Mr. Lassila has regular cystoscopies to look for recurrence of bladder cancer. She said he does not have issues with retaining urine as long as he takes his tamsulosin. 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 His home dose is 4 mg prednisone. He has not had work-up for his tremor or ever been on medication to treat it. Patient seemed confused tonight, talking about how he had driven down the road today and gone on a walk, whereas he has been in the hospital today. His wife says "going on walks" is a regular topic of confusion for him that he regularly refers to. Otherwise, he does not regularly get more confused at night.

## 2016-01-01 NOTE — Progress Notes (Signed)
Family Medicine Teaching Service Daily Progress Note Intern Pager: [phone]  Patient name: Cory Lawrence Medical record number: [id number] Date of birth: [birth year] Age: 82 y.o. Gender: male  Primary Care Provider: [name], MD Consultants: None Code Status: Full  Assessment and Plan: Cory Lawrence is a 82 y.o. male presenting with SOB x1 day and increased sputum production. PMH is significant for hyperlipidemia, hypertension, COPD, CAD, primary biliary cirrhosis, BPH, GERD, type 2 diabetes, A. Fib, anemia, vitamin B12 deficiency, and polymyalgia rheumatica (on prednisone 4 mg daily).  # COPD with exacerbation: Patient presenting with acute worsening in shortness of breath over the past 24-48 hours. Endorses new sputum production. Denies fevers. Denies chest pain, diaphoresis, or headache. Previous smoker for >30 yrs. Quit smoking approximately 20 years ago. No sick contacts. Not tachypnic on exam. Diffuse wheezes in bilateral lung fields with prolonged expiratory phase. Maintaining oxygen saturation on room air. Blood pressure slightly elevated. No peripheral edema or JVD. - telemetry; due to patient's cardiac history - Supplemental O2 as needed. - Albuterol nebulizer every 6 hours and every 2 hours when necessary for wheezing - initiate Spiriva daily - S/p Solu-Medrol in ED - Begin prednisone 50 mg daily  - Continue protonix 40 mg daily for gastric ulcer prophylaxis (2/2 steroid use) - S/p 1 dose of azithromycin and doxycycline IV overnight - Transition to PO doxycycline today to complete 7-day course - pulse ox with vital signs - PT/OT to assess patient's ability to ambulate   # CAD - continue home Imdur  # H/o A Fib - continue home digoxin - Digoxin level on admission was 1.1 - Xarelto per pharmacy (patient is on this at home)  # HTN - continue medications as above.  # Primary biliary cirrhosis - continue home Ursodiol - avoid hepatotoxic agents  #  BPH - continue home medications: tamsulosin, finasteride, doxazosin  # Type 2 diabetes: diet-controlled. Last A1c 06/22/15 was 5.3. - Obtain A1c - SSI sensitive  # AKI: SCr elevated to 1.44 from 1.20 on admission (1.12 in January, as low as 0.9 in Oct. 2015). S/p cystoscopy last week. Has been voiding, so urinary obstruction less likely. BUN/Cr ration > 20, so most likely pre-renal due to dehydration. - Will administer 500 cc NS fluid bolus and recheck BMP this afternoon - Arrange close follow-up with PCP  FEN/GI: heart healthy/carb modified. IV saline lock Prophylaxis: Xarelto per pharmacy  Disposition: pending medical improvement, possible discharge today if SCr does not worsen  Subjective:  Cory Lawrence says his breathing has improved greatly. He has been on RA since admission.   Objective: Temp:  [98 F (36.7 C)-98.4 F (36.9 C)] 98.2 F (36.8 C) (03/03 0900) Pulse Rate:  [54-73] 66 (03/03 0900) Resp:  [13-20] 18 (03/03 0900) BP: (134-179)/(51-66) 152/62 mmHg (03/03 0900) SpO2:  [95 %-100 %] 98 % (03/03 1438) Physical Exam: General: Thin male, sitting up in recliner, pleasant and talkative HEENT: NCAT. EOMI. MM slightly tacky.  Chest: Lungs CTAB. Prolonged expiratory phase with mild wheezes in all lung fields. Cardiac: RRR. No murmurs noted.  Abdomen: +BS, soft, nontender.  CNS: Full body tremor that seemed to improve somewhat with concentration Extremeties: ROM good. 5/5 bilateral strength. Observed ambulating with PT without assistance. Good peripheral pulses. No edema. Skin: No rashes or lesions. Dry flaking skin especially of feet and shins.   Laboratory:  Recent Labs Lab 12/31/15 1454 12/31/15 1720  WBC 4.7 3.8*  HGB 10.4* 10.1*  HCT 31.2* 30.2*  PLT  99* 87*    Recent Labs Lab 12/31/15 1454 12/31/15 1720 01/01/16 0645  NA 138 141 136  K 4.7 4.7 4.5  CL 104 106 105  CO2 24 25 19*  BUN 43* 41* 40*  CREATININE 1.20* 1.37* 1.44*  CALCIUM 8.1* 8.5* 8.2*   PROT 6.4 6.8  --   BILITOT 0.4 0.5  --   ALKPHOS 50 50  --   ALT 15 19  --   AST 20 23  --   GLUCOSE 114* 120* 139*    Imaging/Diagnostic Tests: Dg Chest 2 View  12/31/2015  CLINICAL DATA:  Shortness of breath.  Productive cough for 10 days. EXAM: CHEST  2 VIEW COMPARISON:  12/31/2015 FINDINGS: The patient is rotated to the right on today's radiograph, reducing diagnostic sensitivity and specificity. Atherosclerotic aortic arch. Heart size within normal limits. Mild scarring peripherally at the right lung base. Minimal left mid lung scarring. Prominent emphysema.  No new airspace opacity identified. Thoracic spondylosis. IMPRESSION: 1. Severe emphysema with scattered scarring in the lungs but no acute opacity to suggest pneumonia. ; unchanged appearance from the exam performed several hours ago. 2. Atherosclerotic aortic arch. Electronically Signed   By: Van Clines M.D.   On: 12/31/2015 18:33   Dg Chest 2 View  12/31/2015  CLINICAL DATA:  Cough and wheezing EXAM: CHEST  2 VIEW COMPARISON:  02/25/2014 FINDINGS: Cardiac shadow is within normal limits. Aortic calcifications are again seen. The lungs are hyperinflated consistent with COPD. No focal infiltrate or sizable effusion is noted. Some scarring is noted bilaterally. Degenerative change of the thoracic spine is noted. IMPRESSION: COPD without acute abnormality. Electronically Signed   By: Inez Catalina M.D.   On: 12/31/2015 16:03    Rogue Bussing, MD 01/01/2016, 3:05 PM PGY-1, [place] Intern pager: [phone], text pages welcome

## 2016-01-01 NOTE — Discharge Instructions (Addendum)
Cory Lawrence, [name] were admitted because of shortness of breath due to a COPD exacerbation. You received antibiotics and steroids, that you should continue at home. Please continue to take doxycycline 100 mg twice daily through 01/07/16 and prednisone 50 mg at breakfast through 01/05/16. Then you may resume your normal 4 mg daily dose of prednisone on 01/06/16. Please continue protonix to help avoid stomach ulcers while on steroids. Continue your regular home breathing medications.  Information on my medicine - XARELTO (Rivaroxaban)  This medication education was reviewed with me or my healthcare representative as part of my discharge preparation.  The pharmacist that spoke with me during my hospital stay was:  Bajbus, Lauren, RPH  Why was Xarelto prescribed for you? Xarelto was prescribed for you to reduce the risk of a blood clot forming that can cause a stroke if you have a medical condition called atrial fibrillation (a type of irregular heartbeat).  What do you need to know about xarelto ? Take your Xarelto ONCE DAILY at the same time every day with your evening meal. If you have difficulty swallowing the tablet whole, you may crush it and mix in applesauce just prior to taking your dose.  Take Xarelto exactly as prescribed by your doctor and DO NOT stop taking Xarelto without talking to the doctor who prescribed the medication.  Stopping without other stroke prevention medication to take the place of Xarelto may increase your risk of developing a clot that causes a stroke.  Refill your prescription before you run out.  After discharge, you should have regular check-up appointments with your healthcare provider that is prescribing your Xarelto.  In the future your dose may need to be changed if your kidney function or weight changes by a significant amount.  What do you do if you miss a dose? If you are taking Xarelto ONCE DAILY and you miss a dose, take it as soon as you remember on the  same day then continue your regularly scheduled once daily regimen the next day. Do not take two doses of Xarelto at the same time or on the same day.   Important Safety Information A possible side effect of Xarelto is bleeding. You should call your healthcare provider right away if you experience any of the following: ? Bleeding from an injury or your nose that does not stop. ? Unusual colored urine (red or dark brown) or unusual colored stools (red or black). ? Unusual bruising for unknown reasons. ? A serious fall or if you hit your head (even if there is no bleeding).  Some medicines may interact with Xarelto and might increase your risk of bleeding while on Xarelto. To help avoid this, consult your healthcare provider or pharmacist prior to using any new prescription or non-prescription medications, including herbals, vitamins, non-steroidal anti-inflammatory drugs (NSAIDs) and supplements.  This website has more information on Xarelto: [email or web address]

## 2016-01-01 NOTE — Care Management Note (Signed)
Case Management Note  Patient Details  Name: Cory Lawrence MRN: [id number] Date of Birth: 1925-05-10  Subjective/Objective:       CM following for progression and d/c planning.             Action/Plan: 01/01/16 Met with pt this am and discussed  OBS status, pt sl confused able to discuss his insurance however declined to sign OBS letter.  Met with pt this afternoon to discuss HHPT and d/c needs. Pt remains sl confused however able to carry on a conversation and declined HHPT as he is very active at home and does not feel that he needs therapy. This CM also left a message for the pt wife. Per pt his wife is able to assist at home if needed, she drives, is currently grocery shopping and provides transportation to doctors appointments.  Also offered HHPT to pt wife and left CM phone number is she decides that the pt would benefit from [place].   Expected Discharge Date:                  Expected Discharge Plan:  [place]  In-House Referral:  NA  Discharge planning Services  CM Consult  Post Acute Care Choice:  Home Health Choice offered to:  Patient (Pt currently declining HHPT, message left with pt wife with the HHPT option await response.)  DME Arranged:  N/A (Pt has rolling walker per discusssion with PT) DME Agency:  NA  HH Arranged:    HH Agency:     Status of Service:  In process, will continue to follow  Medicare Important Message Given:    Date Medicare IM Given:    Medicare IM give by:    Date Additional Medicare IM Given:    Additional Medicare Important Message give by:     If discussed at [place] of Stay Meetings, dates discussed:    Additional Comments:  Adron Bene, RN 01/01/2016, 2:40 PM

## 2016-01-02 DIAGNOSIS — E118 Type 2 diabetes mellitus with unspecified complications: Secondary | ICD-10-CM | POA: Diagnosis not present

## 2016-01-02 DIAGNOSIS — I10 Essential (primary) hypertension: Secondary | ICD-10-CM | POA: Diagnosis not present

## 2016-01-02 DIAGNOSIS — J441 Chronic obstructive pulmonary disease with (acute) exacerbation: Secondary | ICD-10-CM | POA: Diagnosis not present

## 2016-01-02 DIAGNOSIS — R748 Abnormal levels of other serum enzymes: Secondary | ICD-10-CM

## 2016-01-02 DIAGNOSIS — R0602 Shortness of breath: Secondary | ICD-10-CM | POA: Diagnosis not present

## 2016-01-02 DIAGNOSIS — R7989 Other specified abnormal findings of blood chemistry: Secondary | ICD-10-CM | POA: Insufficient documentation

## 2016-01-02 LAB — BASIC METABOLIC PANEL
ANION GAP: 11 (ref 5–15)
BUN: 51 mg/dL — ABNORMAL HIGH (ref 6–20)
CHLORIDE: 105 mmol/L (ref 101–111)
CO2: 24 mmol/L (ref 22–32)
CREATININE: 1.33 mg/dL — AB (ref 0.61–1.24)
Calcium: 8.6 mg/dL — ABNORMAL LOW (ref 8.9–10.3)
GFR calc non Af Amer: 46 mL/min — ABNORMAL LOW (ref >60)
GFR, EST AFRICAN AMERICAN: 53 mL/min — AB (ref >60)
Glucose, Bld: 94 mg/dL (ref 65–99)
POTASSIUM: 4.2 mmol/L (ref 3.5–5.1)
Sodium: 140 mmol/L (ref 135–145)

## 2016-01-02 LAB — GLUCOSE, CAPILLARY: Glucose-Capillary: 121 mg/dL — ABNORMAL HIGH (ref 65–99)

## 2016-01-02 LAB — HEMOGLOBIN A1C
Hgb A1c MFr Bld: 5.6 % (ref 4.8–5.6)
Mean Plasma Glucose: 114 mg/dL

## 2016-01-02 MED ORDER — ALBUTEROL SULFATE (2.5 MG/3ML) 0.083% IN NEBU: 2.5000 mg | INHALATION_SOLUTION | Freq: Two times a day (BID) | RESPIRATORY_TRACT | Status: DC

## 2016-01-02 MED ORDER — PREDNISONE 50 MG PO TABS: ORAL_TABLET | ORAL | Status: DC

## 2016-01-02 MED ORDER — DOXYCYCLINE HYCLATE 100 MG PO TABS: 100.0000 mg | ORAL_TABLET | Freq: Two times a day (BID) | ORAL | Status: AC

## 2016-01-02 MED ORDER — ALBUTEROL SULFATE (2.5 MG/3ML) 0.083% IN NEBU
2.5000 mg | INHALATION_SOLUTION | Freq: Four times a day (QID) | RESPIRATORY_TRACT | Status: DC
  Administered 2016-01-02: 2.5 mg via RESPIRATORY_TRACT
  Filled 2016-01-02: qty 3

## 2016-01-02 NOTE — Progress Notes (Signed)
Discharged via wheelchair from [phone]. All belongings sent with patient. Pain level 0 on discharge. Discharge instructions discussed with patient and wife. No questions from either. IV d/c'd previously. Assisted to car.

## 2016-01-02 NOTE — Discharge Summary (Signed)
[facility] Discharge Summary  Patient name: Cory Lawrence Medical record number: [id number] Date of birth: Dec 08, 1924 Age: 82 y.o. Gender: male Date of Admission: 12/31/2015  Date of Discharge: 01/02/2016 Admitting Physician: Kinnie Feil, MD  Primary Care Provider: [name], MD Consultants: None  Indication for Hospitalization: dyspnea  Discharge Diagnoses/Problem List:  Active Problems:   COPD exacerbation ([place])   SOB (shortness of breath)   Essential hypertension   Type 2 diabetes mellitus with complication (HCC)   Elevated serum creatinine   Disposition: Home with [facility] PT  Discharge Condition: Improved  Discharge Exam:  Blood pressure 148/48, pulse 56, temperature 97.2 F (36.2 C), temperature source Oral, resp. rate 18, SpO2 96 %. General: Thin male, sitting up at edge of bed, pleasant and talkative HEENT: NCAT. MMM. Chest: Lungs CTAB. No wheezing appreciated. Occasional wet cough.  Cardiac: RRR. No murmurs noted. Distant heart sounds.  Abdomen: +BS, soft, nontender.  CNS: Has tremor at rest and intention (observed with finger-nose-finger test and taking medications). Pill-rolling tremor also observed.  Extremeties: ROM good. 5/5 bilateral strength. Able to walk around room with ease. Palpable peripheral pulses. No edema. Neuro: Oriented to self and place.  Skin: No rashes or lesions. Dry flaking skin especially of feet and shins.    Brief Hospital Course:  Cory Lawrence is an 82 y.o. male who presented with worsening SOB x1 day and increased sputum production. Of note, he had been seen in clinic prior to admission and found to have had diminished breath sounds and pursing of lips with breathing at rest. PMH is significant for hyperlipidemia, hypertension, COPD, CAD, primary biliary cirrhosis, BPH, GERD, type 2 diabetes, A. Fib, anemia, vitamin B12 deficiency, remote tobacco abuse, and inflammatory arthritis (on  prednisone 4 mg daily). CXR showed no evidence of PNA. He was treated for mild COPD exacerbation with solumedrol, prednisone burst and doxycycline, as well as albuterol nebulizer treatments and spiriva (home combivent not on formulary). He did not require supplemental oxygen, and upon discharge, he still had occasional cough but denied SOB.   Hospitalization was complicated by SCr elevated to 1.49, thought to be an AKI as SCr elevated > 30% from suspected baseline of 1.1. This improved to 1.3 at time of discharge with patient taking great PO and s/p fluid bolus. He also had some agitation with confusion one night, requiring 0.5 mg haldol.   Increased weakness was another concern upon admission. Patient has had a tremor for the last few years that was seen to cause intermittent instability when  he was evaluated by physical therapy. PT recommended home health PT and continued use of rolling walker at home.   Issues for Follow Up:  1. Repeat SCr to look for resolution of AKI.  2. Consider further workup for dementia and tremor.    Significant Procedures: None  Significant Labs and Imaging:   Recent Labs Lab 12/31/15 1454 12/31/15 1720  WBC 4.7 3.8*  HGB 10.4* 10.1*  HCT 31.2* 30.2*  PLT 99* 87*    Recent Labs Lab 12/31/15 1454 12/31/15 1720 01/01/16 0645 01/01/16 1614 01/02/16 0803  NA 138 141 136 137 140  K 4.7 4.7 4.5 4.5 4.2  CL 104 106 105 103 105  CO2 24 25 19* 21* 24  GLUCOSE 114* 120* 139* 169* 94  BUN 43* 41* 40* 47* 51*  CREATININE 1.20* 1.37* 1.44* 1.49* 1.33*  CALCIUM 8.1* 8.5* 8.2* 8.4* 8.6*  ALKPHOS 50 50  --   --   --  AST 20 23  --   --   --   ALT 15 19  --   --   --   ALBUMIN 3.3* 3.3*  --   --   --    CXR 12/31/2015:  1. Severe emphysema with scattered scarring in the lungs but no acute opacity to suggest pneumonia; unchanged appearance from the exam performed several hours ago. 2. Atherosclerotic aortic arch.  Results/Tests Pending at Time of  Discharge: Serum protein electrophoresis  Discharge Medications:    Medication List    TAKE these medications        ALPRAZolam 0.5 MG tablet  Commonly known as:  XANAX  TAKE 1/2 TABLET AT BEDTIME, IF STILL UNABLE TO SLEEP TAKE THE OTHER 1/2 TABLET     cyanocobalamin 1000 MCG/ML injection  Commonly known as:  (VITAMIN B-12)  INJECT 1 ML (1,000 MCG TOTAL) INTO THE MUSCLE EVERY 30 (THIRTY) DAYS.     diclofenac sodium 1 % Gel  Commonly known as:  VOLTAREN  APPLY 2 GRAMS TOPICALLY 3 TIMES DAILY     digoxin 0.125 MG tablet  Commonly known as:  LANOXIN  TAKE 1 TABLET BY MOUTH DAILY     doxazosin 4 MG tablet  Commonly known as:  CARDURA  Take 1 tablet (4 mg total) by mouth at bedtime.     doxycycline 100 MG tablet  Commonly known as:  VIBRA-TABS  Take 1 tablet (100 mg total) by mouth every 12 (twelve) hours.     FERREX 150 FORTE PLUS 50-100 MG Caps  Take 1 capsule by mouth daily.     finasteride 5 MG tablet  Commonly known as:  PROSCAR  Take 1 tablet (5 mg total) by mouth daily.     Fluticasone-Salmeterol 250-50 MCG/DOSE Aepb  Commonly known as:  ADVAIR DISKUS  Inhale 1 puff into the lungs as needed. Shortness of breath.     Ipratropium-Albuterol 20-100 MCG/ACT Aers respimat  Commonly known as:  COMBIVENT  Inhale 1 puff into the lungs every 6 (six) hours as needed for wheezing.     isosorbide mononitrate 30 MG 24 hr tablet  Commonly known as:  IMDUR  TAKE 1 TABLET (30 MG TOTAL) BY MOUTH DAILY.     Melatonin 3 MG Caps  Take 0.75 mg by mouth at bedtime as needed. Sleep     omega-3 acid ethyl esters 1 g capsule  Commonly known as:  LOVAZA  Take 1 g by mouth daily as needed. Wife states she gives it to him when she thinks about it because it can thin his blood out     pantoprazole 40 MG tablet  Commonly known as:  PROTONIX  TAKE 1 TABLET (40 MG TOTAL) BY MOUTH DAILY.     predniSONE 50 MG tablet  Commonly known as:  DELTASONE  Please take daily with breakfast  through 01/05/16. Resume 4 mg daily prednisone on 01/06/16.     Rivaroxaban 15 MG Tabs tablet  Commonly known as:  XARELTO  TAKE 1 TABLET DAILY     simvastatin 40 MG tablet  Commonly known as:  ZOCOR  Take 20 mg by mouth daily.     tamsulosin 0.4 MG Caps capsule  Commonly known as:  FLOMAX  Take 1 capsule (0.4 mg total) by mouth daily after breakfast.     triamcinolone ointment 0.1 %  Commonly known as:  KENALOG  APPLY TO AFFECTED AREA TWICE A DAY     ursodiol 250 MG tablet  Commonly known  as:  ACTIGALL  TAKE 1 TABLET BY MOUTH TWICE A DAY        Discharge Instructions: Please refer to Patient Instructions section of EMR for full details.  Patient was counseled important signs and symptoms that should prompt return to medical care, changes in medications, dietary instructions, activity restrictions, and follow up appointments.   Follow-Up Appointments: Follow-up Information    Follow up with [name], MD On 01/07/2016.   Specialty:  Family Medicine   Why:  3:00 pm appointment for hospital follow-up   Contact information:   [place] [id number]       Rogue Bussing, MD 01/04/2016, 11:02 PM PGY-1, [place]

## 2016-01-02 NOTE — Progress Notes (Signed)
Pt  confused and anxious after waking up and needing to urinate. He broke apart his IV tubing. Refusing IV fluids. Will continue to monitor.

## 2016-01-02 NOTE — Progress Notes (Signed)
Family Medicine Teaching Service Daily Progress Note Intern Pager: [phone]  Patient name: Cory Lawrence Medical record number: [id number] Date of birth: [birth year] Age: 82 y.o. Gender: male  Primary Care Provider: [name], MD Consultants: None Code Status: Full  Assessment and Plan: Cory Lawrence is an 82 y.o. male presenting with SOB x1 day and increased sputum production. PMH is significant for hyperlipidemia, hypertension, COPD, CAD, primary biliary cirrhosis, BPH, GERD, type 2 diabetes, A. Fib, anemia, vitamin B12 deficiency, remote tobacco abuse, and inflammatory arthritis (on prednisone 4 mg daily). He still has occasional cough but denies SOB.   # COPD with exacerbation: Remains afebrile. Maintaining oxygen saturation on room air. Blood pressure variably elevated. No peripheral edema or JVD. - telemetry; due to patient's cardiac history - Supplemental O2 as needed.  - Albuterol nebulizer every 6 hours and every 2 hours when necessary for wheezing - Continue Spiriva daily while hospitalized (uses advair and combivent at home) - S/p Solu-Medrol in ED - Continue prednisone 50 mg daily  - Continue protonix 40 mg daily for gastric ulcer prophylaxis (2/2 steroid use) - S/p 1 dose of azithromycin and doxycycline IV - Continue PO doxycycline to complete 7-day course - pulse ox with vital signs - PT recommending HH PT  # CAD - continue home Imdur  # H/o A Fib - continue home digoxin - Digoxin level on admission was 1.1 - Xarelto per pharmacy (patient is on this at home)  # HTN: BPs ranged from 118/50 to 168/60 overnight - continue medications as above.  # Primary biliary cirrhosis - continue home Ursodiol - avoid hepatotoxic agents  # BPH - continue home medications: tamsulosin, finasteride, doxazosin  # Type 2 diabetes: diet-controlled. Last A1c 06/22/15 was 5.3. - Obtain A1c - SSI sensitive  # AKI: SCr elevated to 1.49 from 1.20 on admission (1.12 in  January, as low as 0.9 in Oct. 2015). S/p cystoscopy last week. Has been voiding, so urinary obstruction less likely. BUN/Cr ration > 20, so most likely pre-renal due to dehydration. Patient received less than 2 hours of IVFs overnight due to agitation with IV.  - Awaiting a.m. BMP - s/p 500 cc NS fluid bolus and ~ another 100 mL fluid overnight; took good PO at 1.4 L yesterday - Arrange close follow-up with PCP (earliest appointment 01/07/16 at 3 PM with Dr. Everlene Farrier)  FEN/GI: heart healthy/carb modified. IV saline lock.  Prophylaxis: Xarelto per pharmacy  Disposition: Likely discharge today if SCr does not worsen; respiratory status stable and improved  Subjective:  Cory Lawrence had some agitation overnight with confusion. He called his wife at home who requested something to help calm him. He received 0.5 mg haldol. He does remember walking the halls last night but does not remember calling his wife. He denies SOB or any pain.   Objective: Temp:  [97.8 F (36.6 C)-98.9 F (37.2 C)] 97.8 F (36.6 C) (03/04 0559) Pulse Rate:  [64-98] 64 (03/04 0559) Resp:  [18-19] 19 (03/03 2123) BP: (109-168)/(48-62) 168/60 mmHg (03/04 0559) SpO2:  [95 %-100 %] 100 % (03/04 0559) Physical Exam: General: Thin male, sitting up at edge of bed, pleasant and talkative HEENT: NCAT. MMM. Chest: Lungs CTAB. No wheezing appreciated. Occasional wet cough.  Cardiac: RRR. No murmurs noted. Distant heart sounds.  Abdomen: +BS, soft, nontender.  CNS: Has tremor at rest and intention (observed with finger-nose-finger test and taking medications). Pill-rolling tremor also observed.  Extremeties: ROM good. 5/5 bilateral strength. Able to walk  around room with ease. Palpable peripheral pulses. No edema. Skin: No rashes or lesions. Dry flaking skin especially of feet and shins.   Laboratory:  Recent Labs Lab 12/31/15 1454 12/31/15 1720  WBC 4.7 3.8*  HGB 10.4* 10.1*  HCT 31.2* 30.2*  PLT 99* 87*    Recent  Labs Lab 12/31/15 1454 12/31/15 1720 01/01/16 0645 01/01/16 1614  NA 138 141 136 137  K 4.7 4.7 4.5 4.5  CL 104 106 105 103  CO2 24 25 19* 21*  BUN 43* 41* 40* 47*  CREATININE 1.20* 1.37* 1.44* 1.49*  CALCIUM 8.1* 8.5* 8.2* 8.4*  PROT 6.4 6.8  --   --   BILITOT 0.4 0.5  --   --   ALKPHOS 50 50  --   --   ALT 15 19  --   --   AST 20 23  --   --   GLUCOSE 114* 120* 139* 169*    Imaging/Diagnostic Tests: No results found.  Rogue Bussing, MD 01/02/2016, 7:30 AM PGY-1, [place] Intern pager: [phone], text pages welcome

## 2016-01-04 ENCOUNTER — Other Ambulatory Visit: Payer: Self-pay | Admitting: Emergency Medicine

## 2016-01-05 LAB — PROTEIN ELECTROPHORESIS, SERUM
ALBUMIN ELP: 3 g/dL — AB (ref 3.8–4.8)
ALPHA-2-GLOBULIN: 0.7 g/dL (ref 0.5–0.9)
Alpha-1-Globulin: 0.3 g/dL (ref 0.2–0.3)
BETA 2: 0.4 g/dL (ref 0.2–0.5)
BETA GLOBULIN: 0.4 g/dL (ref 0.4–0.6)
Gamma Globulin: 1 g/dL (ref 0.8–1.7)
Total Protein, Serum Electrophoresis: 5.8 g/dL — ABNORMAL LOW (ref 6.1–8.1)

## 2016-01-07 ENCOUNTER — Ambulatory Visit (INDEPENDENT_AMBULATORY_CARE_PROVIDER_SITE_OTHER): Payer: Medicare Other | Admitting: Emergency Medicine

## 2016-01-07 ENCOUNTER — Encounter: Payer: Self-pay | Admitting: Emergency Medicine

## 2016-01-07 VITALS — BP 128/62 | HR 97 | Temp 98.2°F | Resp 16 | Wt 121.4 lb

## 2016-01-07 DIAGNOSIS — R700 Elevated erythrocyte sedimentation rate: Secondary | ICD-10-CM

## 2016-01-07 DIAGNOSIS — R739 Hyperglycemia, unspecified: Secondary | ICD-10-CM | POA: Diagnosis not present

## 2016-01-07 DIAGNOSIS — M353 Polymyalgia rheumatica: Secondary | ICD-10-CM

## 2016-01-07 DIAGNOSIS — I129 Hypertensive chronic kidney disease with stage 1 through stage 4 chronic kidney disease, or unspecified chronic kidney disease: Secondary | ICD-10-CM

## 2016-01-07 DIAGNOSIS — J441 Chronic obstructive pulmonary disease with (acute) exacerbation: Secondary | ICD-10-CM | POA: Diagnosis not present

## 2016-01-07 DIAGNOSIS — R634 Abnormal weight loss: Secondary | ICD-10-CM | POA: Diagnosis not present

## 2016-01-07 LAB — BASIC METABOLIC PANEL WITH GFR
BUN: 41 mg/dL — AB (ref 7–25)
CALCIUM: 8.3 mg/dL — AB (ref 8.6–10.3)
CO2: 23 mmol/L (ref 20–31)
Chloride: 102 mmol/L (ref 98–110)
Creat: 1.22 mg/dL — ABNORMAL HIGH (ref 0.70–1.11)
GFR, EST AFRICAN AMERICAN: 61 mL/min (ref >=60)
GFR, Est Non African American: 53 mL/min — ABNORMAL LOW (ref >=60)
GLUCOSE: 112 mg/dL — AB (ref 65–99)
Potassium: 4.6 mmol/L (ref 3.5–5.3)
Sodium: 135 mmol/L (ref 135–146)

## 2016-01-07 LAB — CBC WITH DIFFERENTIAL/PLATELET
Basophils Absolute: 0 10*3/uL (ref 0.0–0.1)
Basophils Relative: 0 % (ref 0–1)
EOS ABS: 0 10*3/uL (ref 0.0–0.7)
EOS PCT: 0 % (ref 0–5)
HEMATOCRIT: 32.5 % — AB (ref 39.0–52.0)
Hemoglobin: 10.9 g/dL — ABNORMAL LOW (ref 13.0–17.0)
Lymphocytes Relative: 8 % — ABNORMAL LOW (ref 12–46)
Lymphs Abs: 0.8 10*3/uL (ref 0.7–4.0)
MCH: 30.3 pg (ref 26.0–34.0)
MCHC: 33.5 g/dL (ref 30.0–36.0)
MCV: 90.3 fL (ref 78.0–100.0)
MONO ABS: 0.3 10*3/uL (ref 0.1–1.0)
MONOS PCT: 3 % (ref 3–12)
MPV: 10.3 fL (ref 8.6–12.4)
Neutro Abs: 9.3 10*3/uL — ABNORMAL HIGH (ref 1.7–7.7)
Neutrophils Relative %: 89 % — ABNORMAL HIGH (ref 43–77)
PLATELETS: 189 10*3/uL (ref 150–400)
RBC: 3.6 MIL/uL — ABNORMAL LOW (ref 4.22–5.81)
RDW: 15.9 % — AB (ref 11.5–15.5)
WBC: 10.4 10*3/uL (ref 4.0–10.5)

## 2016-01-07 MED ORDER — ALBUTEROL SULFATE (2.5 MG/3ML) 0.083% IN NEBU
2.5000 mg | INHALATION_SOLUTION | Freq: Once | RESPIRATORY_TRACT | Status: AC
  Administered 2016-01-07: 2.5 mg via RESPIRATORY_TRACT

## 2016-01-07 MED ORDER — IPRATROPIUM-ALBUTEROL 0.5-2.5 (3) MG/3ML IN SOLN: 3.0000 mL | Freq: Four times a day (QID) | RESPIRATORY_TRACT | Status: AC | PRN

## 2016-01-07 MED ORDER — IPRATROPIUM BROMIDE 0.02 % IN SOLN
0.5000 mg | Freq: Once | RESPIRATORY_TRACT | Status: AC
  Administered 2016-01-07: 0.5 mg via RESPIRATORY_TRACT
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
## 2016-01-08 ENCOUNTER — Other Ambulatory Visit: Payer: Self-pay | Admitting: *Deleted

## 2016-01-08 ENCOUNTER — Telehealth: Payer: Self-pay | Admitting: Cardiovascular Disease

## 2016-01-08 LAB — SEDIMENTATION RATE: Sed Rate: 15 mm/hr (ref 0–20)

## 2016-01-08 NOTE — Telephone Encounter (Signed)
Dr Everlene Farrier, is it OK to RF these? You have seen pt several times recently but not discussed these meds.

## 2016-01-08 NOTE — Telephone Encounter (Signed)
°*  STAT* If patient is at the pharmacy, call can be transferred to refill team.   1. Which medications need to be refilled? (please list name of each medication and dose if known) Isosorbide 30mg   2. Which pharmacy/location (including street and city if local pharmacy) is medication to be sent to? [facility] on [address]   3. Do they need a 30 day or 90 day supply? [place]

## 2016-01-08 NOTE — Telephone Encounter (Signed)
called to inform the pharmacy they had called the wrong office for the refill on Imdur. spoke with tammy, she expressed understanding.

## 2016-01-08 NOTE — Telephone Encounter (Signed)
Error

## 2016-01-11 ENCOUNTER — Other Ambulatory Visit: Payer: Self-pay | Admitting: Emergency Medicine

## 2016-01-14 ENCOUNTER — Ambulatory Visit: Payer: Medicare Other | Admitting: Emergency Medicine

## 2016-01-15 ENCOUNTER — Telehealth: Payer: Self-pay

## 2016-01-15 NOTE — Telephone Encounter (Signed)
PA is needed for Xarelto that pt takes for his A fib. Dr Everlene Farrier, the PA form is asking if pt has a bioprosthetic heart valve or mechanical prosthetic heart valve. I don't see any mention of either in his chart. Do you know the answer? Is there anything else you want me to add to the comments to add to the case for ins covering this med for pt?

## 2016-01-15 NOTE — Telephone Encounter (Signed)
Dr Everlene Farrier, you saw pt recently for check up, but I don't see any recent lipid tests. Do you want to RF or have pt RTC?

## 2016-01-16 NOTE — Telephone Encounter (Signed)
The patient is on Xarelto  for atrial fibrillation. Please be sure he has his prescription to take.

## 2016-01-21 ENCOUNTER — Telehealth: Payer: Self-pay | Admitting: *Deleted

## 2016-01-21 ENCOUNTER — Telehealth: Payer: Self-pay | Admitting: Emergency Medicine

## 2016-01-21 NOTE — Telephone Encounter (Signed)
Okay to do nebulizer in the morning and 1 puff of Advair at night.

## 2016-01-21 NOTE — Telephone Encounter (Signed)
I call patient and gave them the okay to do nebulizer in the morning and 1 puff of Advair at night.

## 2016-01-21 NOTE — Telephone Encounter (Signed)
Pt wife wants to know if they can use Advair at night and the nebulizer machine in the morning?

## 2016-02-02 ENCOUNTER — Other Ambulatory Visit: Payer: Self-pay | Admitting: Emergency Medicine

## 2016-02-03 ENCOUNTER — Other Ambulatory Visit: Payer: Self-pay | Admitting: Emergency Medicine

## 2016-02-03 NOTE — Telephone Encounter (Signed)
Faxed

## 2016-02-05 ENCOUNTER — Telehealth: Payer: Self-pay

## 2016-02-05 NOTE — Telephone Encounter (Signed)
Spoke with patient's wife. I let her know that I sent in a request for a PA for his Xarelto.

## 2016-02-05 NOTE — Telephone Encounter (Signed)
Prior auth for Xarelto 15mg sent to [facility]. 

## 2016-02-11 ENCOUNTER — Ambulatory Visit (INDEPENDENT_AMBULATORY_CARE_PROVIDER_SITE_OTHER): Payer: Medicare Other | Admitting: Emergency Medicine

## 2016-02-11 VITALS — BP 111/53 | HR 86 | Temp 98.3°F | Resp 18 | Ht 63.0 in | Wt 127.0 lb

## 2016-02-11 DIAGNOSIS — R700 Elevated erythrocyte sedimentation rate: Secondary | ICD-10-CM

## 2016-02-11 DIAGNOSIS — D649 Anemia, unspecified: Secondary | ICD-10-CM

## 2016-02-11 LAB — CBC WITH DIFFERENTIAL/PLATELET
BASOS PCT: 0 %
Basophils Absolute: 0 cells/uL (ref 0–200)
EOS PCT: 0 %
Eosinophils Absolute: 0 cells/uL — ABNORMAL LOW (ref 15–500)
HCT: 32.7 % — ABNORMAL LOW (ref 38.5–50.0)
Hemoglobin: 10.9 g/dL — ABNORMAL LOW (ref 13.2–17.1)
Lymphocytes Relative: 6 %
Lymphs Abs: 522 cells/uL — ABNORMAL LOW (ref 850–3900)
MCH: 30.9 pg (ref 27.0–33.0)
MCHC: 33.3 g/dL (ref 32.0–36.0)
MCV: 92.6 fL (ref 80.0–100.0)
MPV: 10 fL (ref 7.5–12.5)
Monocytes Absolute: 261 cells/uL (ref 200–950)
Monocytes Relative: 3 %
NEUTROS ABS: 7917 {cells}/uL — AB (ref 1500–7800)
Neutrophils Relative %: 91 %
PLATELETS: 159 10*3/uL (ref 140–400)
RBC: 3.53 MIL/uL — ABNORMAL LOW (ref 4.20–5.80)
RDW: 15.6 % — ABNORMAL HIGH (ref 11.0–15.0)
WBC: 8.7 10*3/uL (ref 3.8–10.8)

## 2016-02-11 LAB — SEDIMENTATION RATE: SED RATE: 14 mm/h (ref 0–20)

## 2016-02-11 NOTE — Progress Notes (Signed)
Patient ID: Cory Lawrence, male   DOB: 1925/10/26, 82 y.o.   MRN: [id number]    By signing my name below, I, Cory Lawrence, attest that this documentation has been prepared under the direction and in the presence of Cory Russian, MD Electronically Signed: Ladene Lawrence, ED Scribe 02/11/2016 at 4:09 PM.  Chief Complaint:  Chief Complaint  Patient presents with  . Follow-up  . Labs Only   HPI: Cory Lawrence is a 82 y.o. male, with a h/o COPD, HTN, who reports to [facility] today for a follow-up appointment regarding COPD exacerbation. Pt's wife states that his cough has improved approximately 95% but fatigue persists. Pt denies loss of appetite. Pt is not currently using O2 at night; states he sleeps fine without it. Triage SpO2: 93%, repeat SpO2 during examination: 97%. Triage BP: 111/53. Pt is currently only taking half of a 4 mg Cardura tablet.   Wt Readings from Last 3 Encounters:  02/11/16 127 lb (57.607 kg)  01/07/16 121 lb 6.4 oz (55.067 kg)  12/31/15 124 lb 9.6 oz (56.518 kg)   [facility] Pt has a h/o DM and hyperlipidemia. He was seen at [place] 2 months ago to have his toenails trimmed. He was advised to return every 6 weeks.   Past Medical History  Diagnosis Date  . Dizziness   . Diaphoresis   . Palpitations   . COPD (chronic obstructive pulmonary disease) ([place])   . Hypertension   . Hyperlipidemia   . GERD (gastroesophageal reflux disease)   . BPH (benign prostatic hypertrophy)   . Personal history of colonic polyps 07/08/2002,06/2011    tubular adenoma, hyperplastic  . Internal hemorrhoids without mention of complication   . Diverticulosis of colon (without mention of hemorrhage)   . Biliary cirrhosis ([place])   . Unspecified gastritis and gastroduodenitis without mention of hemorrhage   . NASH (nonalcoholic steatohepatitis)   . Anxiety   . Arthritis   . Ulcer     Gastric/bleeding  . Diabetes mellitus type 2, diet-controlled ([place])     managed  with diet  . Vitamin B12 deficiency   . PAF (paroxysmal atrial fibrillation) (HCC)     on Xarelto  . CAD (coronary artery disease)     prior abnormal Myoview; subsequent cath; managed medically  . Heart attack [facility])    Past Surgical History  Procedure Laterality Date  . Cataract extraction      bilateral  . Eye surgery      left eye x multiple  . Cardiac catheterization  March 2013    Managed medically   Social History   Social History  . Marital Status: Married    Spouse Name: N/A  . Number of Children: 2  . Years of Education: N/A   Occupational History  . retired    Social History Main Topics  . Smoking status: Former Research scientist (life sciences)  . Smokeless tobacco: Former Systems developer    Quit date: 05/31/1996  . Alcohol Use: No  . Drug Use: No  . Sexual Activity: No   Other Topics Concern  . Not on file   Social History Narrative   Family History  Problem Relation Age of Onset  . Heart failure Mother   . Diabetes Mother   . Diabetes Sister   . Heart failure Sister   . Colon cancer Neg Hx   . Cancer Mother     unknown type   Allergies  Allergen Reactions  . Ciprofloxacin Hcl Swelling  .  Penicillins Hives    Has patient had a PCN reaction causing immediate rash, facial/tongue/throat swelling, SOB or lightheadedness with hypotension: YES Has patient had a PCN reaction causing severe rash involving mucus membranes or skin necrosis: NO Has patient had a PCN reaction that required hospitalization NO Has patient had a PCN reaction occurring within the last 10 years: NO If all of the above answers are "NO", then may proceed with Cephalosporin use.   Prior to Admission medications   Medication Sig Start Date End Date Taking? Authorizing Provider  ALPRAZolam (XANAX) 0.5 MG tablet TAKE 1/2 TABLET BY MOUTH AT BEDTIME MAY TAKE ADDITIONAL 1/2TABLET BY MOUTH AS NEEDED IF CANNOT SLEEP 02/02/16   Cory Russian, MD  cyanocobalamin (,VITAMIN B-12,) 1000 MCG/ML injection INJECT 1 ML (1,000 MCG  TOTAL) INTO THE MUSCLE EVERY 30 (THIRTY) DAYS. 02/25/14   Cory Castle, MD  diclofenac sodium (VOLTAREN) 1 % GEL APPLY 2 GRAMS TOPICALLY 3 TIMES DAILY 12/08/15   Cory Russian, MD  digoxin (LANOXIN) 0.125 MG tablet TAKE 1 TABLET BY MOUTH DAILY 12/20/15   Cory Slocumb, PA-C  doxazosin (CARDURA) 4 MG tablet Take 1 tablet (4 mg total) by mouth at bedtime. 12/10/15   Cory Russian, MD  Fe-Succ Ac-C-Thre Ac-B12-FA (FERREX 150 FORTE PLUS) 50-100 MG CAPS Take 1 capsule by mouth daily. 11/19/12   Cory Feil, MD  finasteride (PROSCAR) 5 MG tablet Take 1 tablet (5 mg total) by mouth daily. 10/07/14   Cory Russian, MD  Fluticasone-Salmeterol (ADVAIR DISKUS) 250-50 MCG/DOSE AEPB Inhale 1 puff into the lungs as needed. Shortness of breath. 09/10/15   Cory Russian, MD  Ipratropium-Albuterol (COMBIVENT) 20-100 MCG/ACT AERS respimat Inhale 1 puff into the lungs every 6 (six) hours as needed for wheezing. 09/10/15   Cory Russian, MD  ipratropium-albuterol (DUONEB) 0.5-2.5 (3) MG/3ML SOLN Take 3 mLs by nebulization every 6 (six) hours as needed. 01/07/16   Cory Russian, MD  isosorbide mononitrate (IMDUR) 30 MG 24 hr tablet TAKE 1 TABLET (30 MG TOTAL) BY MOUTH DAILY. 01/09/16   Cory Russian, MD  Melatonin 3 MG CAPS Take 0.75 mg by mouth at bedtime as needed. Sleep     Historical Provider, MD  omega-3 acid ethyl esters (LOVAZA) 1 g capsule Take 1 g by mouth daily as needed. Wife states she gives it to him when she thinks about it because it can thin his blood out    Historical Provider, MD  pantoprazole (PROTONIX) 40 MG tablet TAKE 1 TABLET (40 MG TOTAL) BY MOUTH DAILY. 04/01/15   Cory Castle, MD  predniSONE (DELTASONE) 50 MG tablet Please take daily with breakfast through 01/05/16. Resume 4 mg daily prednisone on 01/06/16. 01/02/16   Cory Bussing, MD  simvastatin (ZOCOR) 40 MG tablet Take 20 mg by mouth daily.    Historical Provider, MD  simvastatin (ZOCOR) 40 MG tablet TAKE 1 TABLET BY MOUTH EVERY DAY AT  BEDTIME 01/16/16   Cory Russian, MD  tamsulosin (FLOMAX) 0.4 MG CAPS capsule Take 1 capsule (0.4 mg total) by mouth daily after breakfast. 10/07/14   Cory Russian, MD  triamcinolone ointment (KENALOG) 0.1 % APPLY TO AFFECTED AREA TWICE A DAY 12/08/15   Cory Russian, MD  ursodiol (ACTIGALL) 250 MG tablet TAKE 1 TABLET BY MOUTH TWICE A DAY 11/03/15   Cory Russian, MD  XARELTO 15 MG TABS tablet TAKE 1 TABLET BY MOUTH EVERY DAY 01/09/16   Remo Lipps  A Darrelle Barrell, MD   ROS: The patient denies fevers, chills, night sweats, unintentional weight loss, -appetite change, chest pain, palpitations, wheezing, dyspnea on exertion, nausea, vomiting, abdominal pain, dysuria, hematuria, melena, numbness, weakness, or tingling. +fatigue, +cough  All other systems have been reviewed and were otherwise negative with the exception of those mentioned in the HPI and as above.    PHYSICAL EXAM: Filed Vitals:   02/11/16 1543  BP: 111/53  Pulse: 86  Temp: 98.3 F (36.8 C)  Resp: 18   Body mass index is 22.5 kg/(m^2).  General: Alert, no acute distress, frail and debilitated male  HEENT:  Normocephalic, atraumatic, oropharynx patent. Eye: Juliette Mangle [facility] Cardiovascular: Irregular rhythm. No Carotid bruits, radial pulse intact. No pedal edema.  Respiratory: Decreased breath sounds in both bases.  Abdominal: No organomegaly, abdomen is soft and non-tender, positive bowel sounds. No masses. Musculoskeletal: Gait intact. No edema, tenderness Skin: No rashes. Neurologic: Facial musculature symmetric. Psychiatric: Patient acts appropriately throughout our interaction. Lymphatic: No cervical or submandibular lymphadenopathy  LABS:  EKG/XRAY:   Primary read interpreted by Dr. [facility]lene Farrier at [facility].  ASSESSMENT/PLAN: Main problem has been fatigue. He still recovering from his respiratory infection which resulted in hospitalization. His respiratory status is improving. He has had an overnight pulse oximetry however I do not have these  results. I asked his wife to contact [facility] to be sure they send me the results of this study. I decreased his Cardura to 1 mg a day and hopes that that will help his fatigue. CBC was done today along with a sedimentation rate.I personally performed the services described in this documentation, which was scribed in my presence. The recorded information has been reviewed and is accurate.    Gross sideeffects, risk and benefits, and alternatives of medications d/w patient. Patient is aware that all medications have potential sideeffects and we are unable to predict every sideeffect or drug-drug interaction that may occur.  [name] MD 02/11/2016 3:49 PM

## 2016-02-11 NOTE — Patient Instructions (Addendum)
Decrease Cardura to one quarter tablet a day    IF you received an x-ray today, you will receive an invoice from [facility]. Please contact [facility] at [phone] with questions or concerns regarding your invoice.   IF you received labwork today, you will receive an invoice from [employer]. Please contact [facility] at [phone] with questions or concerns regarding your invoice.   Our billing staff will not be able to assist you with questions regarding bills from these companies.  You will be contacted with the lab results as soon as they are available. The fastest way to get your results is to activate your My Chart account. Instructions are located on the last page of this paperwork. If you have not heard from [place] regarding the results in 2 weeks, please contact this office.

## 2016-02-12 ENCOUNTER — Telehealth: Payer: Self-pay

## 2016-02-12 NOTE — Telephone Encounter (Signed)
Xarelto approved through 10/30/2016

## 2016-02-12 NOTE — Telephone Encounter (Signed)
PA was approved through 10/30/16. Notified pharm.

## 2016-02-15 ENCOUNTER — Other Ambulatory Visit: Payer: Self-pay | Admitting: Emergency Medicine

## 2016-02-16 NOTE — Telephone Encounter (Signed)
RFs on pred?

## 2016-02-19 ENCOUNTER — Other Ambulatory Visit: Payer: Self-pay | Admitting: Gastroenterology

## 2016-02-29 IMAGING — CR DG CHEST 2V
2 series · 2 of 2 positions shown · non-contrast
Comparison: None.

CLINICAL DATA: Cough.

EXAM:
CHEST  2 VIEW

[lateral]
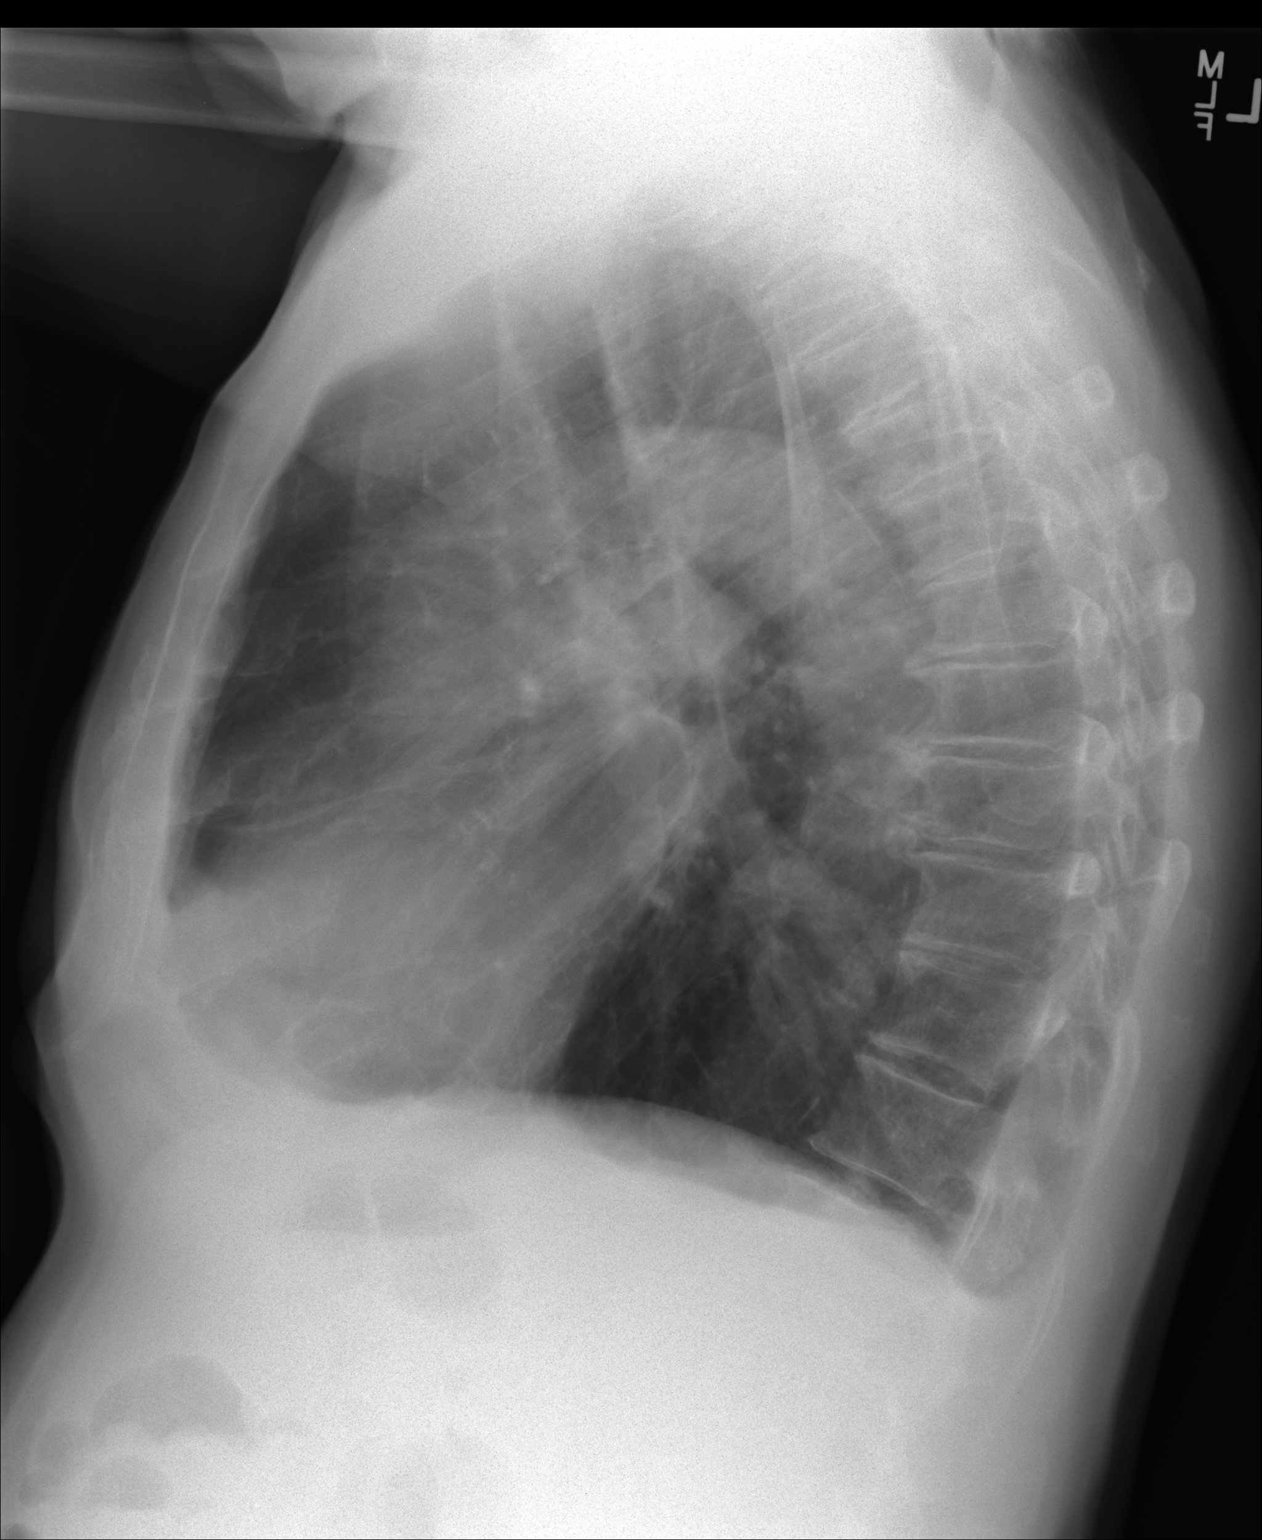

[PA]
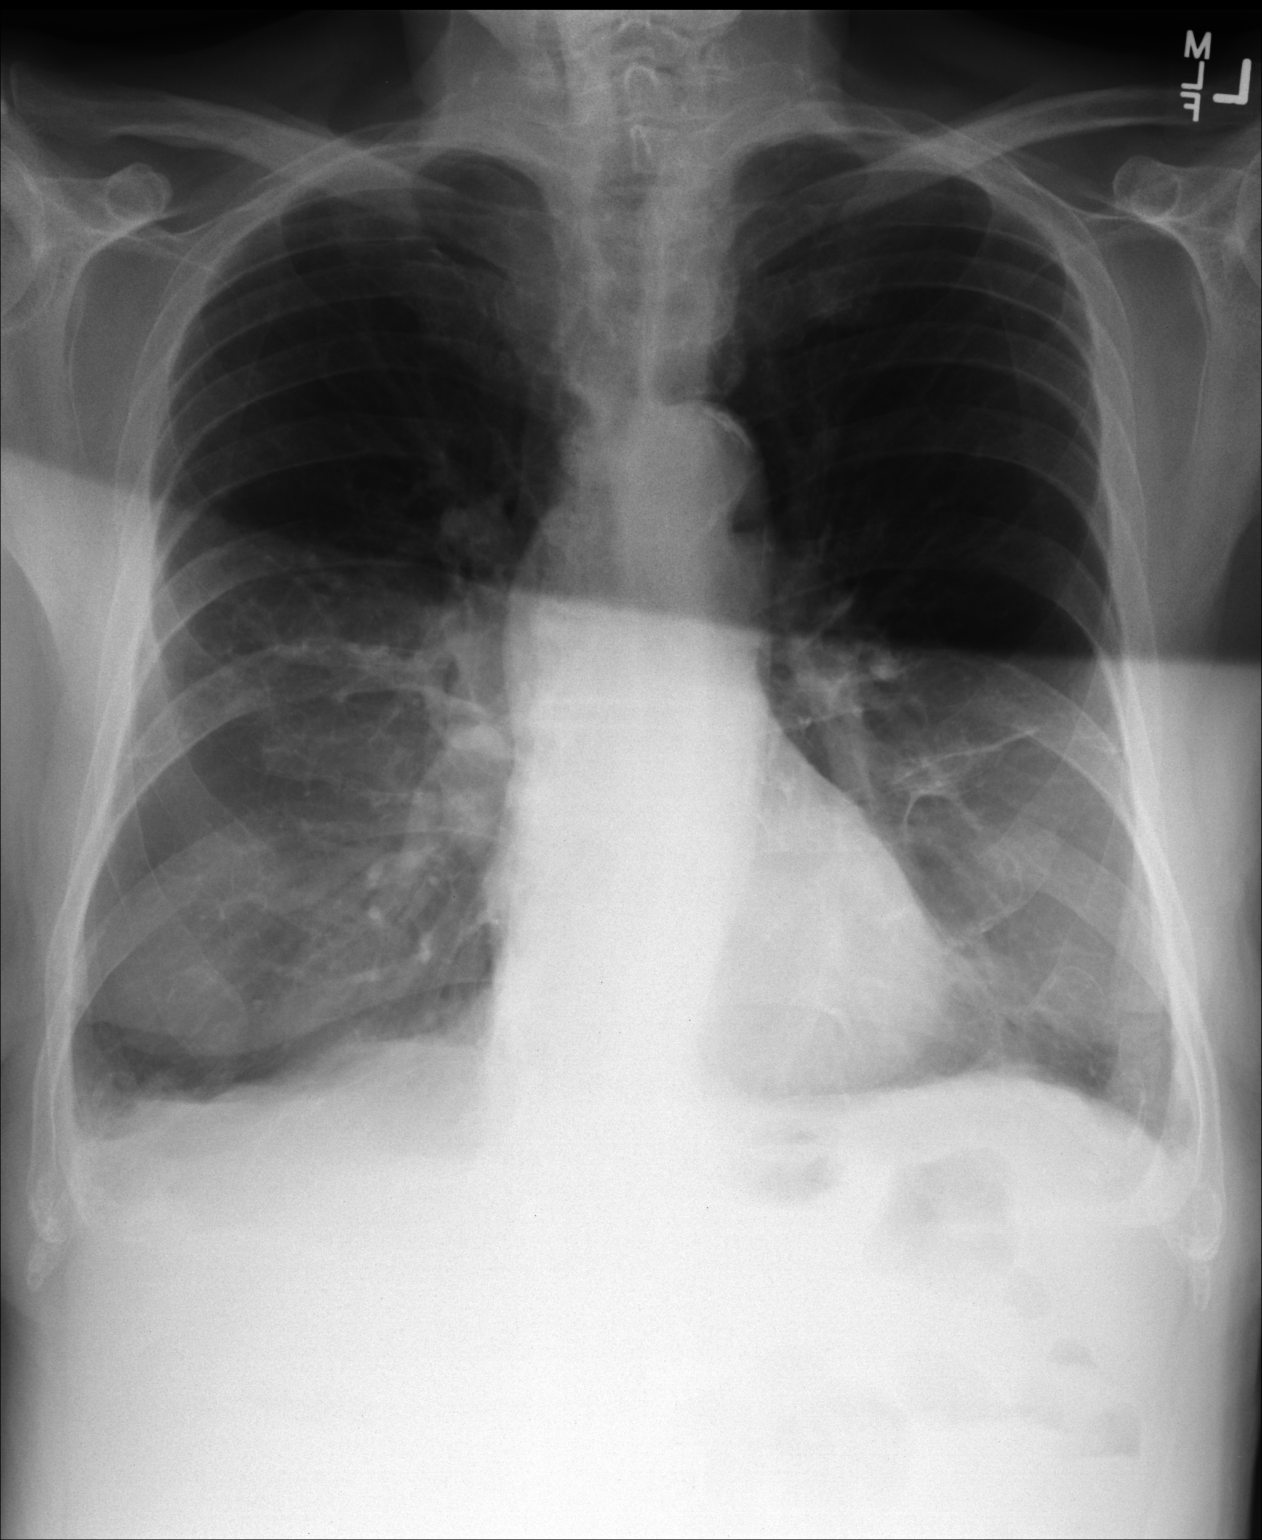

[2 of 2 positions shown; findings below may reference images not displayed]

FINDINGS: Nondiagnostic evaluation of the mid and upper left hemi thorax
secondary to the significant artifact on the anterior view.

Visualized cardiac and mediastinal contours are grossly
unremarkable. The lungs appear hyperinflated. No definite pleural
effusion. Mid thoracic spine degenerative change.
IMPRESSION: Nondiagnostic anterior view secondary to significant artifact.
Recommend repeat evaluation.

Within the above limitations, there is suggestion of scarring within
the bilateral lung bases.

The lungs appear hyperinflated.

## 2016-03-18 ENCOUNTER — Other Ambulatory Visit: Payer: Self-pay | Admitting: Physician Assistant

## 2016-03-24 ENCOUNTER — Ambulatory Visit (INDEPENDENT_AMBULATORY_CARE_PROVIDER_SITE_OTHER): Payer: Medicare Other | Admitting: Emergency Medicine

## 2016-03-24 ENCOUNTER — Encounter: Payer: Self-pay | Admitting: Emergency Medicine

## 2016-03-24 VITALS — BP 126/58 | HR 69 | Temp 99.1°F | Resp 16 | Ht 63.0 in | Wt 125.4 lb

## 2016-03-24 DIAGNOSIS — D649 Anemia, unspecified: Secondary | ICD-10-CM | POA: Diagnosis not present

## 2016-03-24 DIAGNOSIS — J441 Chronic obstructive pulmonary disease with (acute) exacerbation: Secondary | ICD-10-CM | POA: Diagnosis not present

## 2016-03-24 DIAGNOSIS — L989 Disorder of the skin and subcutaneous tissue, unspecified: Secondary | ICD-10-CM | POA: Diagnosis not present

## 2016-03-24 DIAGNOSIS — R739 Hyperglycemia, unspecified: Secondary | ICD-10-CM | POA: Diagnosis not present

## 2016-03-24 DIAGNOSIS — R700 Elevated erythrocyte sedimentation rate: Secondary | ICD-10-CM

## 2016-03-24 LAB — CBC WITH DIFFERENTIAL/PLATELET
BASOS ABS: 0 {cells}/uL (ref 0–200)
Basophils Relative: 0 %
EOS PCT: 1 %
Eosinophils Absolute: 60 cells/uL (ref 15–500)
HCT: 33.3 % — ABNORMAL LOW (ref 38.5–50.0)
Hemoglobin: 11.1 g/dL — ABNORMAL LOW (ref 13.2–17.1)
Lymphocytes Relative: 10 %
Lymphs Abs: 600 cells/uL — ABNORMAL LOW (ref 850–3900)
MCH: 30.7 pg (ref 27.0–33.0)
MCHC: 33.3 g/dL (ref 32.0–36.0)
MCV: 92 fL (ref 80.0–100.0)
MONOS PCT: 3 %
MPV: 10.5 fL (ref 7.5–12.5)
Monocytes Absolute: 180 cells/uL — ABNORMAL LOW (ref 200–950)
NEUTROS ABS: 5160 {cells}/uL (ref 1500–7800)
NEUTROS PCT: 86 %
PLATELETS: 146 10*3/uL (ref 140–400)
RBC: 3.62 MIL/uL — ABNORMAL LOW (ref 4.20–5.80)
RDW: 15.3 % — ABNORMAL HIGH (ref 11.0–15.0)
WBC: 6 10*3/uL (ref 3.8–10.8)

## 2016-03-24 LAB — HEMOGLOBIN A1C
HEMOGLOBIN A1C: 5.6 % (ref <5.7)
Mean Plasma Glucose: 114 mg/dL

## 2016-03-24 NOTE — Patient Instructions (Signed)
     IF you received an x-ray today, you will receive an invoice from [facility]. Please contact  Radiology at [phone] with questions or concerns regarding your invoice.   IF you received labwork today, you will receive an invoice from [facility]. Please contact [facility] at [phone] with questions or concerns regarding your invoice.   Our billing staff will not be able to assist you with questions regarding bills from these companies.  You will be contacted with the lab results as soon as they are available. The fastest way to get your results is to activate your My Chart account. Instructions are located on the last page of this paperwork. If you have not heard from us regarding the results in 2 weeks, please contact this office.      

## 2016-03-24 NOTE — Progress Notes (Signed)
By signing my name below, I, [name], attest that this documentation has been prepared under the direction and in the presence of [name], MD.  Electronically Signed: [name], Medical Scribe. 03/24/2016. 4:20 PM.  Chief Complaint: No chief complaint on file.   HPI: Cory Lawrence is a 82 y.o. male with a PMHx of COPD who reports to [facility] today for a follow up. Pt reports he feels good. Pt reports the neb he takes in the morning has significantly helped his breathing but causes him to get  "jittery" and shaky. Pt still drinks coffee. Pt reports his shoulders and his knees are achy. Pt mentions he like to graze throughout the day. His current weight is about 125lbs. Pt reports he takes 4 mg of prednisone QID for polymyalgia. Pt complains about a lesion on the top of his head and a lesion on the left side of his ear. Pt's dermatologist is Dr. Allyson Sabal but he hasn't seen him for a while. Pt wont wear sunscreen and hardly wears hats.  Past Medical History  Diagnosis Date  . Dizziness   . Diaphoresis   . Palpitations   . COPD (chronic obstructive pulmonary disease) ([place])   . Hypertension   . Hyperlipidemia   . GERD (gastroesophageal reflux disease)   . BPH (benign prostatic hypertrophy)   . Personal history of colonic polyps 07/08/2002,06/2011    tubular adenoma, hyperplastic  . Internal hemorrhoids without mention of complication   . Diverticulosis of colon (without mention of hemorrhage)   . Biliary cirrhosis ([place])   . Unspecified gastritis and gastroduodenitis without mention of hemorrhage   . NASH (nonalcoholic steatohepatitis)   . Anxiety   . Arthritis   . Ulcer     Gastric/bleeding  . Diabetes mellitus type 2, diet-controlled ([place])     managed with diet  . Vitamin B12 deficiency   . PAF (paroxysmal atrial fibrillation) (H[facility])     on Xarelto  . CAD (coronary artery disease)     prior abnormal Myoview; subsequent cath; managed medically  . Heart attack [facility])     Past Surgical History  Procedure Laterality Date  . Cataract extraction      bilateral  . Eye surgery      left eye x multiple  . Cardiac catheterization  March 2013    Managed medically   Social History   Social History  . Marital Status: Married    Spouse Name: N/A  . Number of Children: 2  . Years of Education: N/A   Occupational History  . retired    Social History Main Topics  . Smoking status: Former Research scientist (life sciences)  . Smokeless tobacco: Former Systems developer    Quit date: 05/31/1996  . Alcohol Use: No  . Drug Use: No  . Sexual Activity: No   Other Topics Concern  . Not on file   Social History Narrative   Family History  Problem Relation Age of Onset  . Heart failure Mother   . Diabetes Mother   . Diabetes Sister   . Heart failure Sister   . Colon cancer Neg Hx   . Cancer Mother     unknown type   Allergies  Allergen Reactions  . Ciprofloxacin Hcl Swelling  . Penicillins Hives    Has patient had a PCN reaction causing immediate rash, facial/tongue/throat swelling, SOB or lightheadedness with hypotension: YES Has patient had a PCN reaction causing severe rash involving mucus membranes or skin necrosis: NO Has patient had a PCN  reaction that required hospitalization NO Has patient had a PCN reaction occurring within the last 10 years: NO If all of the above answers are "NO", then may proceed with Cephalosporin use.   Prior to Admission medications   Medication Sig Start Date End Date Taking? Authorizing Provider  ALPRAZolam (XANAX) 0.5 MG tablet TAKE 1/2 TABLET BY MOUTH AT BEDTIME MAY TAKE ADDITIONAL 1/2TABLET BY MOUTH AS NEEDED IF CANNOT SLEEP 02/02/16   [name], MD  cyanocobalamin (,VITAMIN B-12,) 1000 MCG/ML injection INJECT 1 ML (1,000 MCG TOTAL) INTO THE MUSCLE EVERY 30 (THIRTY) DAYS. 02/25/14   Inda Castle, MD  diclofenac sodium (VOLTAREN) 1 % GEL APPLY 2 GRAMS TOPICALLY 3 TIMES DAILY 12/08/15   [name], MD  digoxin (LANOXIN) 0.125 MG tablet TAKE 1  TABLET BY MOUTH DAILY 03/19/16   Ezekiel Slocumb, PA-C  doxazosin (CARDURA) 4 MG tablet Take 1 tablet (4 mg total) by mouth at bedtime. Patient taking differently: Take 4 mg by mouth at bedtime. 1 1/2 tab qd 12/10/15   [name], MD  Fe-Succ Ac-C-Thre Ac-B12-FA (FERREX 150 FORTE PLUS) 50-100 MG CAPS Take 1 capsule by mouth daily. 11/19/12   Sable Feil, MD  finasteride (PROSCAR) 5 MG tablet Take 1 tablet (5 mg total) by mouth daily. 10/07/14   [name], MD  Fluticasone-Salmeterol (ADVAIR DISKUS) 250-50 MCG/DOSE AEPB Inhale 1 puff into the lungs as needed. Shortness of breath. 09/10/15   [name], MD  Ipratropium-Albuterol (COMBIVENT) 20-100 MCG/ACT AERS respimat Inhale 1 puff into the lungs every 6 (six) hours as needed for wheezing. 09/10/15   [name], MD  ipratropium-albuterol (DUONEB) 0.5-2.5 (3) MG/3ML SOLN Take 3 mLs by nebulization every 6 (six) hours as needed. 01/07/16   [name], MD  isosorbide mononitrate (IMDUR) 30 MG 24 hr tablet TAKE 1 TABLET (30 MG TOTAL) BY MOUTH DAILY. 01/09/16   [name], MD  Melatonin 3 MG CAPS Take 0.75 mg by mouth at bedtime as needed. Sleep     Historical Provider, MD  omega-3 acid ethyl esters (LOVAZA) 1 g capsule Take 1 g by mouth daily as needed. Wife states she gives it to him when she thinks about it because it can thin his blood out    Historical Provider, MD  pantoprazole (PROTONIX) 40 MG tablet TAKE 1 TABLET (40 MG TOTAL) BY MOUTH DAILY. 04/01/15   Inda Castle, MD  predniSONE (DELTASONE) 1 MG tablet TAKE 4 TABLETS DAILY FOR DOSAGE OF 4 MG A DAY 02/16/16   [name], MD  predniSONE (DELTASONE) 50 MG tablet Please take daily with breakfast through 01/05/16. Resume 4 mg daily prednisone on 01/06/16. 01/02/16   Rogue Bussing, MD  simvastatin (ZOCOR) 40 MG tablet Take 20 mg by mouth daily.    Historical Provider, MD  simvastatin (ZOCOR) 40 MG tablet TAKE 1 TABLET BY MOUTH EVERY DAY AT BEDTIME Patient not taking: Reported on  02/11/2016 01/16/16   [name], MD  tamsulosin (FLOMAX) 0.4 MG CAPS capsule Take 1 capsule (0.4 mg total) by mouth daily after breakfast. Patient not taking: Reported on 02/11/2016 10/07/14   [name], MD  triamcinolone ointment (KENALOG) 0.1 % APPLY TO AFFECTED AREA TWICE A DAY 12/08/15   [name], MD  ursodiol (ACTIGALL) 250 MG tablet TAKE 1 TABLET BY MOUTH TWICE A DAY 11/03/15   [name], MD  XARELTO 15 MG TABS tablet TAKE 1 TABLET BY  MOUTH EVERY DAY 01/09/16   [name], MD     ROS: The patient denies fevers, chills, night sweats, unintentional weight loss, chest pain, palpitations, wheezing, dyspnea on exertion, nausea, vomiting, abdominal pain, dysuria, hematuria, melena, numbness, weakness, or tingling.  All other systems have been reviewed and were otherwise negative with the exception of those mentioned in the HPI and as above.    PHYSICAL EXAM: Filed Vitals:   03/24/16 1634  BP: 126/58  Pulse: 69  Temp: 99.1 F (37.3 C)  Resp: 16   Body mass index is 22.22 kg/(m^2).   General: Alert, no acute distress HEENT:  Normocephalic, atraumatic, oropharynx patent. Eye: [facility] Cardiovascular:  Irregular rate and rhythm. No Carotid bruits, radial pulse intact. No pedal edema.   Respiratory: Clear to auscultation bilaterally.  No wheezes, rales, or rhonchi.  No cyanosis, no use of accessory musculature Poor air exchange with increased diameter Abdominal: No organomegaly, abdomen is soft and non-tender, positive bowel sounds.  No masses. Musculoskeletal: Gait intact. No edema, tenderness. Extremities are fine Skin: No rashes. He's got a 1x1 cm scaly area at the top of his scalp Neurologic: Facial musculature symmetric. Psychiatric: Patient acts appropriately throughout our interaction. Lymphatic: No cervical or submandibular lymphadenopathy    LABS:    EKG/XRAY:   Primary read interpreted by Dr. Everlene Farrier at [facility].   ASSESSMENT/PLAN: Patient is doing well.  We'll see if he can just use his Advair at night. He will continue to do one nebulizer treatment in the morning. Sedimentation rate was done today and we'll see what dosage of prednisone he needs to be on. He is currently on 4 mg a day.I personally performed the services described in this documentation, which was scribed in my presence. The recorded information has been reviewed and is accurate.   Gross sideeffects, risk and benefits, and alternatives of medications d/w patient. Patient is aware that all medications have potential sideeffects and we are unable to predict every sideeffect or drug-drug interaction that may occur.  [name] MD 03/24/2016 4:20 PM

## 2016-03-25 LAB — SEDIMENTATION RATE: SED RATE: 15 mm/h (ref 0–20)

## 2016-05-05 ENCOUNTER — Encounter: Payer: Self-pay | Admitting: Emergency Medicine

## 2016-05-05 ENCOUNTER — Ambulatory Visit (INDEPENDENT_AMBULATORY_CARE_PROVIDER_SITE_OTHER): Payer: Medicare Other | Admitting: Emergency Medicine

## 2016-05-05 ENCOUNTER — Other Ambulatory Visit: Payer: Self-pay | Admitting: Emergency Medicine

## 2016-05-05 ENCOUNTER — Ambulatory Visit (INDEPENDENT_AMBULATORY_CARE_PROVIDER_SITE_OTHER): Payer: Medicare Other

## 2016-05-05 VITALS — BP 126/63 | HR 72 | Temp 97.9°F | Resp 18 | Ht 63.0 in

## 2016-05-05 DIAGNOSIS — R042 Hemoptysis: Secondary | ICD-10-CM | POA: Diagnosis not present

## 2016-05-05 DIAGNOSIS — D649 Anemia, unspecified: Secondary | ICD-10-CM | POA: Diagnosis not present

## 2016-05-05 DIAGNOSIS — R29818 Other symptoms and signs involving the nervous system: Secondary | ICD-10-CM | POA: Diagnosis not present

## 2016-05-05 DIAGNOSIS — J449 Chronic obstructive pulmonary disease, unspecified: Secondary | ICD-10-CM | POA: Diagnosis not present

## 2016-05-05 DIAGNOSIS — R700 Elevated erythrocyte sedimentation rate: Secondary | ICD-10-CM | POA: Diagnosis not present

## 2016-05-05 DIAGNOSIS — R079 Chest pain, unspecified: Secondary | ICD-10-CM

## 2016-05-05 LAB — POCT CBC
Granulocyte percent: 86.8 %G — AB (ref 37–80)
HEMATOCRIT: 26.1 % — AB (ref 43.5–53.7)
HEMOGLOBIN: 9.2 g/dL — AB (ref 14.1–18.1)
LYMPH, POC: 0.7 (ref 0.6–3.4)
MCH, POC: 36.2 pg — AB (ref 27–31.2)
MCHC: 35.4 g/dL (ref 31.8–35.4)
MCV: 92.2 fL (ref 80–97)
MID (cbc): 0.1 (ref 0–0.9)
MPV: 6.5 fL (ref 0–99.8)
POC GRANULOCYTE: 5.1 (ref 2–6.9)
POC LYMPH PERCENT: 11.1 %L (ref 10–50)
POC MID %: 5.1 %M (ref 0–12)
Platelet Count, POC: 177 10*3/uL (ref 142–424)
RBC: 2.83 M/uL — AB (ref 4.69–6.13)
RDW, POC: 15.3 %
WBC: 5.9 10*3/uL (ref 4.6–10.2)

## 2016-05-05 MED ORDER — DONEPEZIL HCL 5 MG PO TABS: 5.0000 mg | ORAL_TABLET | Freq: Every day | ORAL | Status: AC

## 2016-05-05 MED ORDER — DOXYCYCLINE HYCLATE 100 MG PO TABS
100.0000 mg | ORAL_TABLET | Freq: Two times a day (BID) | ORAL | Status: DC
Start: 2016-05-05 — End: 2016-06-02

## 2016-05-05 NOTE — Progress Notes (Addendum)
Patient ID: Cory Lawrence, male   DOB: 1925-06-21, 82 y.o.   MRN: [id number]    By signing my name below, I, Cory Lawrence, attest that this documentation has been prepared under the direction and in the presence of [name], MD Electronically Signed: [name], ED Scribe 05/05/2016 at 1:15 PM.  Chief Complaint:  Chief Complaint  Patient presents with  . Follow-up    anemia  . Positive depression screen   HPI: Cory Lawrence is a 82 y.o. male who reports to [facility] today complaining of persistent left side pain s/p a fall that occurred 5 days ago. Pt's wife states that pt fell out of the bed 5 days ago and landed on his left side 5 days ago. No head injury or LOC. She states that she had to call the fire department to assist with getting him up. Wife reports that the fire department obtained an EKG when they visited his home. Pt reports increased left side pain with palpation. He further reports associated hemoptysis for the past 4-5 days as well. Pt's last CXR on 12/31/15 showed severe emphysema with scarring but no evidence of PNA. His CT of his chest in July 2015 showed no evidence of CA.   Past Medical History  Diagnosis Date  . Dizziness   . Diaphoresis   . Palpitations   . COPD (chronic obstructive pulmonary disease) ([place])   . Hypertension   . Hyperlipidemia   . GERD (gastroesophageal reflux disease)   . BPH (benign prostatic hypertrophy)   . Personal history of colonic polyps 07/08/2002,06/2011    tubular adenoma, hyperplastic  . Internal hemorrhoids without mention of complication   . Diverticulosis of colon (without mention of hemorrhage)   . Biliary cirrhosis ([place])   . Unspecified gastritis and gastroduodenitis without mention of hemorrhage   . NASH (nonalcoholic steatohepatitis)   . Anxiety   . Arthritis   . Ulcer     Gastric/bleeding  . Diabetes mellitus type 2, diet-controlled ([place])     managed with diet  . Vitamin B12 deficiency   . PAF (paroxysmal  atrial fibrillation) ([facility])     on Xarelto  . CAD (coronary artery disease)     prior abnormal Myoview; subsequent cath; managed medically  . Heart attack [facility])    Past Surgical History  Procedure Laterality Date  . Cataract extraction      bilateral  . Eye surgery      left eye x multiple  . Cardiac catheterization  March 2013    Managed medically   Social History   Social History  . Marital Status: Married    Spouse Name: N/A  . Number of Children: 2  . Years of Education: N/A   Occupational History  . retired    Social History Main Topics  . Smoking status: Former Research scientist (life sciences)  . Smokeless tobacco: Former Systems developer    Quit date: 05/31/1996  . Alcohol Use: No  . Drug Use: No  . Sexual Activity: No   Other Topics Concern  . None   Social History Narrative   Family History  Problem Relation Age of Onset  . Heart failure Mother   . Diabetes Mother   . Diabetes Sister   . Heart failure Sister   . Colon cancer Neg Hx   . Cancer Mother     unknown type   Allergies  Allergen Reactions  . Ciprofloxacin Hcl Swelling  . Penicillins Hives    Has patient had a  PCN reaction causing immediate rash, facial/tongue/throat swelling, SOB or lightheadedness with hypotension: YES Has patient had a PCN reaction causing severe rash involving mucus membranes or skin necrosis: NO Has patient had a PCN reaction that required hospitalization NO Has patient had a PCN reaction occurring within the last 10 years: NO If all of the above answers are "NO", then may proceed with Cephalosporin use.   Prior to Admission medications   Medication Sig Start Date End Date Taking? Authorizing Provider  ALPRAZolam (XANAX) 0.5 MG tablet TAKE 1/2 TABLET BY MOUTH AT BEDTIME MAY TAKE ADDITIONAL 1/2TABLET BY MOUTH AS NEEDED IF CANNOT SLEEP 02/02/16   [name], MD  cyanocobalamin (,VITAMIN B-12,) 1000 MCG/ML injection INJECT 1 ML (1,000 MCG TOTAL) INTO THE MUSCLE EVERY 30 (THIRTY) DAYS. 02/25/14   Inda Castle, MD  diclofenac sodium (VOLTAREN) 1 % GEL APPLY 2 GRAMS TOPICALLY 3 TIMES DAILY 12/08/15   [name], MD  digoxin (LANOXIN) 0.125 MG tablet TAKE 1 TABLET BY MOUTH DAILY 03/19/16   Ezekiel Slocumb, PA-C  doxazosin (CARDURA) 4 MG tablet Take 1 tablet (4 mg total) by mouth at bedtime. Patient taking differently: Take 4 mg by mouth at bedtime. 1 1/2 tab qd 12/10/15   [name], MD  Fe-Succ Ac-C-Thre Ac-B12-FA (FERREX 150 FORTE PLUS) 50-100 MG CAPS Take 1 capsule by mouth daily. 11/19/12   Sable Feil, MD  finasteride (PROSCAR) 5 MG tablet Take 1 tablet (5 mg total) by mouth daily. 10/07/14   [name], MD  Fluticasone-Salmeterol (ADVAIR DISKUS) 250-50 MCG/DOSE AEPB Inhale 1 puff into the lungs as needed. Shortness of breath. 09/10/15   [name], MD  Ipratropium-Albuterol (COMBIVENT) 20-100 MCG/ACT AERS respimat Inhale 1 puff into the lungs every 6 (six) hours as needed for wheezing. Patient not taking: Reported on 03/24/2016 09/10/15   [name], MD  ipratropium-albuterol (DUONEB) 0.5-2.5 (3) MG/3ML SOLN Take 3 mLs by nebulization every 6 (six) hours as needed. 01/07/16   [name], MD  isosorbide mononitrate (IMDUR) 30 MG 24 hr tablet TAKE 1 TABLET (30 MG TOTAL) BY MOUTH DAILY. Patient not taking: Reported on 03/24/2016 01/09/16   [name], MD  Melatonin 3 MG CAPS Take 0.75 mg by mouth at bedtime as needed. Sleep     Historical Provider, MD  omega-3 acid ethyl esters (LOVAZA) 1 g capsule Take 1 g by mouth daily as needed. Wife states she gives it to him when she thinks about it because it can thin his blood out    Historical Provider, MD  pantoprazole (PROTONIX) 40 MG tablet TAKE 1 TABLET (40 MG TOTAL) BY MOUTH DAILY. Patient not taking: Reported on 03/24/2016 04/01/15   Inda Castle, MD  predniSONE (DELTASONE) 1 MG tablet TAKE 4 TABLETS DAILY FOR DOSAGE OF 4 MG A DAY 02/16/16   [name], MD  simvastatin (ZOCOR) 40 MG tablet Take 20 mg by mouth daily.    Historical  Provider, MD  simvastatin (ZOCOR) 40 MG tablet TAKE 1 TABLET BY MOUTH EVERY DAY AT BEDTIME 01/16/16   [name], MD  tamsulosin [facility]) 0.4 MG CAPS capsule Take 1 capsule (0.4 mg total) by mouth daily after breakfast. 10/07/14   [name], MD  triamcinolone ointment (KENALOG) 0.1 % APPLY TO AFFECTED AREA TWICE A DAY 12/08/15   [name], MD  ursodiol (ACTIGALL) 250 MG tablet TAKE 1 TABLET BY MOUTH TWICE A DAY 11/03/15   Remo Lipps  A Kordae Buonocore, MD  XARELTO 15 MG TABS tablet TAKE 1 TABLET BY MOUTH EVERY DAY 01/09/16   [name], MD   ROS: The patient denies fevers, chills, night sweats, unintentional weight loss, chest pain, palpitations, wheezing, dyspnea on exertion, nausea, vomiting, dysuria, hematuria, melena, numbness, weakness, or tingling.   All other systems have been reviewed and were otherwise negative with the exception of those mentioned in the HPI and as above.    PHYSICAL EXAM: Filed Vitals:   05/05/16 1240  BP: 126/63  Pulse: 72  Temp: 97.9 F (36.6 C)  Resp: 18   There is no weight on file to calculate BMI.  General: Alert, no acute distress HEENT:  Normocephalic, atraumatic, oropharynx patent. Eye: [facility] Cardiovascular:  Regular rate and rhythm, no rubs murmurs or gallops. No Carotid bruits, radial pulse intact. No pedal edema.  Respiratory: Dry rales in the L base Abdominal: No organomegaly, abdomen is soft. No tenderness in the LUQ. Positive bowel sounds. No masses. Musculoskeletal: Gait intact. No edema, tenderness Skin: No rashes. Neurologic: Facial musculature symmetric. Psychiatric: Patient acts appropriately throughout our interaction. Lymphatic: No cervical or submandibular lymphadenopathy  LABS: Results for orders placed or performed in visit on 05/05/16  POCT CBC  Result Value Ref Range   WBC 5.9 4.6 - 10.2 K/uL   Lymph, poc 0.7 0.6 - 3.4   POC LYMPH PERCENT 11.1 10 - 50 %L   MID (cbc) 0.1 0 - 0.9   POC MID % 5.1 0 - 12 %M   POC Granulocyte  5.1 2 - 6.9   Granulocyte percent 86.8 (A) 37 - 80 %G   RBC 2.83 (A) 4.69 - 6.13 M/uL   Hemoglobin 9.2 (A) 14.1 - 18.1 g/dL   HCT, POC 26.1 (A) 43.5 - 53.7 %   MCV 92.2 80 - 97 fL   MCH, POC 36.2 (A) 27 - 31.2 pg   MCHC 35.4 31.8 - 35.4 g/dL   RDW, POC 15.3 %   Platelet Count, POC 177 142 - 424 K/uL   MPV 6.5 0 - 99.8 fL   EKG/XRAY:   Primary read interpreted by Dr. Everlene Farrier at [facility] Dg Chest 2 View  05/05/2016  CLINICAL DATA:  Chest pain, left flank pain, fall 5 days ago EXAM: CHEST  2 VIEW COMPARISON:  12/31/2015 FINDINGS: Cardiomediastinal silhouette is stable. Hyperinflation again noted. Stable chronic interstitial prominence. There is left basilar streaky atelectasis or early infiltrate. No pulmonary edema. Osteopenia and degenerative changes thoracic spine. Atherosclerotic calcifications of thoracic aorta. No pneumothorax. IMPRESSION: Hyperinflation. Chronic interstitial prominence. No pulmonary edema. Streaky left basilar atelectasis or early infiltrate. No pneumothorax. Electronically Signed   By: Lahoma Crocker M.D.   On: 05/05/2016 13:47   Dg Ribs Unilateral W/chest Left  05/05/2016  CLINICAL DATA:  Left-sided chest pain, fell 5 days ago EXAM: LEFT RIBS AND CHEST - 3+ VIEW COMPARISON:  Chest x-ray of 12/31/2015 FINDINGS: On the images obtained, no acute left rib fracture is seen. The current images are very obliqued and standard rib detail views would be helpful. The lungs appear to be hyperaerated consistent with an element of emphysema. IMPRESSION: 1. No acute left rib fracture seen on the images obtained. Please see above. 2. Emphysema. Electronically Signed   By: Ivar Drape M.D.   On: 05/05/2016 13:47   ASSESSMENT/PLAN: Patient is anemic. He stopped going to the cancer center for evaluation of his anemia. He has had no recent evaluation. He does have an infiltrate in  left base and having pain in that area. He is already on blood thinner for A. fib. We'll cover with doxycycline antibiotic.  Will schedule a CT of the chest. According to his wife he also has been having some significant memory issues and agitated off and on. He is also accusing her of being unfavorable. We'll try Aricept 5 mg 1 a day and see if this helps.I personally performed the services described in this documentation, which was scribed in my presence. The recorded information has been reviewed and is accurate.    Gross sideeffects, risk and benefits, and alternatives of medications d/w patient. Patient is aware that all medications have potential sideeffects and we are unable to predict every sideeffect or drug-drug interaction that may occur.  [name] MD 05/05/2016 1:02 PM

## 2016-05-05 NOTE — Patient Instructions (Addendum)
     IF you received an x-ray today, you will receive an invoice from [facility]. Please contact [facility] at [phone] with questions or concerns regarding your invoice.   IF you received labwork today, you will receive an invoice from [employer]. Please contact [facility] at [phone] with questions or concerns regarding your invoice.   Our billing staff will not be able to assist you with questions regarding bills from these companies.  You will be contacted with the lab results as soon as they are available. The fastest way to get your results is to activate your My Chart account. Instructions are located on the last page of this paperwork. If you have not heard from [place] regarding the results in 2 weeks, please contact this office.     Hemoptysis Hemoptysis, which means coughing up blood, can be a sign of a minor problem or a serious medical condition. The blood that is coughed up may come from the lungs and airways. Coughed-up blood can also come from bleeding that occurs outside the lungs and airways. Blood can drain into the windpipe during a severe nosebleed or when blood is vomited from the stomach. Because hemoptysis can be a sign of something serious, a medical evaluation is required. For some people with hemoptysis, no definite cause is ever identified. CAUSES  The most common cause of hemoptysis is bronchitis. Some other common causes include:   A ruptured blood vessel caused by coughing or an infection.   A medical condition that causes damage to the large air passageways (bronchiectasis).   A blood clot in the lungs (pulmonary embolism).   Pneumonia.   Tuberculosis.   Breathing in a small foreign object.   Cancer. For some people with hemoptysis, no definite cause is ever identified.  HOME CARE INSTRUCTIONS  Only take over-the-counter or prescription medicines as directed by your caregiver. Do not use cough  suppressants unless your caregiver approves.  If your caregiver prescribes antibiotic medicines, take them as directed. Finish them even if you start to feel better.  Do not smoke. Also avoid secondhand smoke.  Follow up with your caregiver as directed. SEEK IMMEDIATE MEDICAL CARE IF:   You cough up bloody mucus for longer than a week.  You have a blood-producing cough that is severe or getting worse.  You have a blood-producing cough thatcomes and goes over time.  You develop problems with your breathing.   You vomit blood.  You develop bloody or black-colored stools.  You have chest pain.   You develop night sweats.  You feel faint or pass out.   You have a fever or persistent symptoms for more than 2-3 days.  You have a fever and your symptoms suddenly get worse. MAKE SURE YOU:  Understand these instructions.  Will watch your condition.  Will get help right away if you are not doing well or get worse.   This information is not intended to replace advice given to you by your health care provider. Make sure you discuss any questions you have with your health care provider.   Document Released: 12/26/2001 Document Revised: 10/03/2012 Document Reviewed: 08/03/2012 Elsevier Interactive Patient Education [employer].

## 2016-05-06 LAB — SEDIMENTATION RATE: Sed Rate: 60 mm/hr — ABNORMAL HIGH (ref 0–20)

## 2016-05-06 NOTE — Telephone Encounter (Signed)
Dr Everlene Farrier, I don't see this med discussed recently, but pt had check up with you yesterday. OK to refill or does pt need to RTC?

## 2016-05-10 ENCOUNTER — Other Ambulatory Visit: Payer: Self-pay | Admitting: Emergency Medicine

## 2016-05-13 ENCOUNTER — Telehealth: Payer: Self-pay

## 2016-05-13 NOTE — Telephone Encounter (Signed)
-----   Message from [name], MD sent at 05/06/2016  7:28 AM EDT ----- Call patient. His sedimentation rate has gone up to 60. This may be due to infection his left lung. He will need a repeat sedimentation rate in about one month. Be sure he continues on his prednisone. Confirm what dose he is taking and send me a note that

## 2016-05-13 NOTE — Telephone Encounter (Signed)
Spoke with pt's wife.  Pt is taking 1mg  tablet-Prednisone - 3 times per day.  She states you changed him to TID 5-6 weeks ago.  Advised to keep taking that dose and we would call if that needs to be changed.

## 2016-05-14 NOTE — Telephone Encounter (Signed)
Have him increase his prednisone to 5 mg a day for now. He can take #5  1 mg tablets or he can take a single 5 mg tablet.

## 2016-05-16 ENCOUNTER — Emergency Department (HOSPITAL_COMMUNITY): Payer: Medicare Other

## 2016-05-16 ENCOUNTER — Emergency Department (HOSPITAL_COMMUNITY)
Admission: EM | Admit: 2016-05-16 | Discharge: 2016-05-16 | Disposition: A | Discharge status: Home / Self Care | Payer: Medicare Other | Attending: Emergency Medicine | Admitting: Emergency Medicine

## 2016-05-16 ENCOUNTER — Encounter (HOSPITAL_COMMUNITY): Payer: Self-pay | Admitting: Emergency Medicine

## 2016-05-16 DIAGNOSIS — J449 Chronic obstructive pulmonary disease, unspecified: Secondary | ICD-10-CM | POA: Diagnosis not present

## 2016-05-16 DIAGNOSIS — Z79899 Other long term (current) drug therapy: Secondary | ICD-10-CM | POA: Diagnosis not present

## 2016-05-16 DIAGNOSIS — Z7951 Long term (current) use of inhaled steroids: Secondary | ICD-10-CM | POA: Insufficient documentation

## 2016-05-16 DIAGNOSIS — I2510 Atherosclerotic heart disease of native coronary artery without angina pectoris: Secondary | ICD-10-CM | POA: Insufficient documentation

## 2016-05-16 DIAGNOSIS — R109 Unspecified abdominal pain: Secondary | ICD-10-CM | POA: Diagnosis present

## 2016-05-16 DIAGNOSIS — I10 Essential (primary) hypertension: Secondary | ICD-10-CM | POA: Diagnosis not present

## 2016-05-16 DIAGNOSIS — I480 Paroxysmal atrial fibrillation: Secondary | ICD-10-CM | POA: Diagnosis not present

## 2016-05-16 DIAGNOSIS — K5900 Constipation, unspecified: Secondary | ICD-10-CM | POA: Diagnosis not present

## 2016-05-16 DIAGNOSIS — Z792 Long term (current) use of antibiotics: Secondary | ICD-10-CM | POA: Insufficient documentation

## 2016-05-16 DIAGNOSIS — M1990 Unspecified osteoarthritis, unspecified site: Secondary | ICD-10-CM | POA: Diagnosis not present

## 2016-05-16 DIAGNOSIS — Z87891 Personal history of nicotine dependence: Secondary | ICD-10-CM | POA: Insufficient documentation

## 2016-05-16 DIAGNOSIS — E119 Type 2 diabetes mellitus without complications: Secondary | ICD-10-CM | POA: Diagnosis not present

## 2016-05-16 DIAGNOSIS — K4020 Bilateral inguinal hernia, without obstruction or gangrene, not specified as recurrent: Secondary | ICD-10-CM | POA: Diagnosis not present

## 2016-05-16 DIAGNOSIS — E785 Hyperlipidemia, unspecified: Secondary | ICD-10-CM | POA: Diagnosis not present

## 2016-05-16 LAB — URINALYSIS, ROUTINE W REFLEX MICROSCOPIC
BILIRUBIN URINE: NEGATIVE
Glucose, UA: NEGATIVE mg/dL
Ketones, ur: NEGATIVE mg/dL
LEUKOCYTES UA: NEGATIVE
NITRITE: NEGATIVE
PH: 5.5 (ref 5.0–8.0)
Protein, ur: NEGATIVE mg/dL
SPECIFIC GRAVITY, URINE: 1.011 (ref 1.005–1.030)

## 2016-05-16 LAB — CBC
HEMATOCRIT: 30.6 % — AB (ref 39.0–52.0)
HEMOGLOBIN: 9.9 g/dL — AB (ref 13.0–17.0)
MCH: 30.2 pg (ref 26.0–34.0)
MCHC: 32.4 g/dL (ref 30.0–36.0)
MCV: 93.3 fL (ref 78.0–100.0)
Platelets: 211 10*3/uL (ref 150–400)
RBC: 3.28 MIL/uL — AB (ref 4.22–5.81)
RDW: 14.4 % (ref 11.5–15.5)
WBC: 9.6 10*3/uL (ref 4.0–10.5)

## 2016-05-16 LAB — URINE MICROSCOPIC-ADD ON

## 2016-05-16 LAB — COMPREHENSIVE METABOLIC PANEL
ALBUMIN: 3.5 g/dL (ref 3.5–5.0)
ALK PHOS: 73 U/L (ref 38–126)
ALT: 17 U/L (ref 17–63)
ANION GAP: 7 (ref 5–15)
AST: 23 U/L (ref 15–41)
BILIRUBIN TOTAL: 0.9 mg/dL (ref 0.3–1.2)
BUN: 47 mg/dL — ABNORMAL HIGH (ref 6–20)
CALCIUM: 8.5 mg/dL — AB (ref 8.9–10.3)
CO2: 24 mmol/L (ref 22–32)
Chloride: 105 mmol/L (ref 101–111)
Creatinine, Ser: 1.9 mg/dL — ABNORMAL HIGH (ref 0.61–1.24)
GFR calc non Af Amer: 30 mL/min — ABNORMAL LOW (ref >60)
GFR, EST AFRICAN AMERICAN: 34 mL/min — AB (ref >60)
GLUCOSE: 98 mg/dL (ref 65–99)
POTASSIUM: 4.1 mmol/L (ref 3.5–5.1)
Sodium: 136 mmol/L (ref 135–145)
TOTAL PROTEIN: 7 g/dL (ref 6.5–8.1)

## 2016-05-16 LAB — LIPASE, BLOOD: Lipase: 23 U/L (ref 11–51)

## 2016-05-16 NOTE — Telephone Encounter (Signed)
Spoke with pt's wife. He has bilateral inguinal hernias. The left side is very sore and swollen. He has not been able to have a bowel movement for the past few days. Pt. Advised to go to the ER at [facility] for evaluation. ER called.

## 2016-05-16 NOTE — Telephone Encounter (Signed)
Spoke with patient's wife regarding RX, she verbalized understanding. She also mention that the patient was lifting limbs in the yard this past week and has been in the bed for the past few days unable to use the restroom. She wanted to know when Dr. Everlene Farrier would be back in the clinic. I told her that if the symptoms did not improve by tomorrow or became worse, she is more than welcome to schedule an walk-in appt with someone else since Dr. Everlene Farrier will not be back in the clinic until Wednesday. She verbalized understanding.

## 2016-05-16 NOTE — Discharge Instructions (Signed)

## 2016-05-16 NOTE — ED Provider Notes (Signed)
CSN: [id number]     Arrival date & time 05/16/16  1433 History   First MD Initiated Contact with Patient 05/16/16 1450     Chief Complaint  Patient presents with  . Abdominal Pain      Patient is a 82 y.o. male presenting with abdominal pain. The history is provided by the patient.  Abdominal Pain Associated symptoms: constipation and nausea   Associated symptoms: no shortness of breath and no vomiting   Patient presents with abdominal pain. Has had constipation for 3 days. Has chronic bilateral inguinal hernias. States the left one has been more painful and has not been able to go back in for the last couple days. No fevers or chills. Mild nausea without vomiting. No dysuria. He has turned down surgery in the past. He is on anticoagulation for atrial fibrillation.  Past Medical History  Diagnosis Date  . Dizziness   . Diaphoresis   . Palpitations   . COPD (chronic obstructive pulmonary disease) ([place])   . Hypertension   . Hyperlipidemia   . GERD (gastroesophageal reflux disease)   . BPH (benign prostatic hypertrophy)   . Personal history of colonic polyps 07/08/2002,06/2011    tubular adenoma, hyperplastic  . Internal hemorrhoids without mention of complication   . Diverticulosis of colon (without mention of hemorrhage)   . Biliary cirrhosis ([place])   . Unspecified gastritis and gastroduodenitis without mention of hemorrhage   . NASH (nonalcoholic steatohepatitis)   . Anxiety   . Arthritis   . Ulcer     Gastric/bleeding  . Diabetes mellitus type 2, diet-controlled ([place])     managed with diet  . Vitamin B12 deficiency   . PAF (paroxysmal atrial fibrillation) (H[facility])     on Xarelto  . CAD (coronary artery disease)     prior abnormal Myoview; subsequent cath; managed medically  . Heart attack [facility])    Past Surgical History  Procedure Laterality Date  . Cataract extraction      bilateral  . Eye surgery      left eye x multiple  . Cardiac catheterization  March 2013    Managed  medically   Family History  Problem Relation Age of Onset  . Heart failure Mother   . Diabetes Mother   . Diabetes Sister   . Heart failure Sister   . Colon cancer Neg Hx   . Cancer Mother     unknown type   Social History  Substance Use Topics  . Smoking status: Former Research scientist (life sciences)  . Smokeless tobacco: Former Systems developer    Quit date: 05/31/1996  . Alcohol Use: No    Review of Systems  Constitutional: Positive for appetite change.  Respiratory: Negative for shortness of breath.   Gastrointestinal: Positive for nausea, abdominal pain and constipation. Negative for vomiting and blood in stool.  Genitourinary: Negative for flank pain.  Musculoskeletal: Negative for back pain.  Skin: Negative for wound.  Neurological: Negative for headaches.      Allergies  Ciprofloxacin hcl and Penicillins  Home Medications   Prior to Admission medications   Medication Sig Start Date End Date Taking? Authorizing Provider  ALPRAZolam (XANAX) 0.5 MG tablet TAKE 1/2 TABLET BY MOUTH AT BEDTIME MAY TAKE ADDITIONAL 1/2TABLET BY MOUTH AS NEEDED IF CANNOT SLEEP 02/02/16  Yes [name], MD  cyanocobalamin (,VITAMIN B-12,) 1000 MCG/ML injection INJECT 1 ML (1,000 MCG TOTAL) INTO THE MUSCLE EVERY 30 (THIRTY) DAYS. 02/25/14  Yes Inda Castle, MD  digoxin (LANOXIN) 0.125 MG  tablet TAKE 1 TABLET BY MOUTH DAILY 03/19/16  Yes Ezekiel Slocumb, PA-C  donepezil (ARICEPT) 5 MG tablet Take 1 tablet (5 mg total) by mouth at bedtime. 05/05/16  Yes [name], MD  doxazosin (CARDURA) 4 MG tablet Take 1 tablet (4 mg total) by mouth at bedtime. Patient taking differently: Take 2 mg by mouth at bedtime.  12/10/15  Yes [name], MD  doxycycline (VIBRA-TABS) 100 MG tablet Take 1 tablet (100 mg total) by mouth 2 (two) times daily. 05/05/16  Yes [name], MD  Fe-Succ Ac-C-Thre Ac-B12-FA (FERREX 150 FORTE PLUS) 50-100 MG CAPS Take 1 capsule by mouth daily. 11/19/12  Yes Sable Feil, MD  finasteride (PROSCAR) 5 MG  tablet Take 1 tablet (5 mg total) by mouth daily. 10/07/14  Yes [name], MD  Fluticasone-Salmeterol (ADVAIR DISKUS) 250-50 MCG/DOSE AEPB Inhale 1 puff into the lungs as needed. Shortness of breath. Patient taking differently: Inhale 1 puff into the lungs at bedtime. Shortness of breath. 09/10/15  Yes [name], MD  ipratropium-albuterol (DUONEB) 0.5-2.5 (3) MG/3ML SOLN Take 3 mLs by nebulization every 6 (six) hours as needed. Patient taking differently: Take 3 mLs by nebulization every 6 (six) hours as needed (sob and wheezing).  01/07/16  Yes [name], MD  isosorbide mononitrate (IMDUR) 30 MG 24 hr tablet TAKE 1 TABLET (30 MG TOTAL) BY MOUTH DAILY. 01/09/16  Yes [name], MD  Melatonin 3 MG CAPS Take 1.5 mg by mouth at bedtime as needed (sleep). Sleep    Yes Historical Provider, MD  predniSONE (DELTASONE) 1 MG tablet TAKE 4 TABLETS DAILY FOR DOSAGE OF 4 MG A DAY Patient taking differently: TAKE 5 TABLETS DAILY FOR DOSAGE OF 5 MG A DAY 02/16/16  Yes [name], MD  simvastatin (ZOCOR) 40 MG tablet TAKE 1 TABLET BY MOUTH EVERY DAY AT BEDTIME Patient taking differently: TAKE 0.5 TABLET BY MOUTH EVERY DAY AT BEDTIME 01/16/16  Yes [name], MD  XARELTO 15 MG TABS tablet TAKE 1 TABLET BY MOUTH EVERY DAY 01/09/16  Yes [name], MD  diclofenac sodium (VOLTAREN) 1 % GEL APPLY 2 GRAMS TOPICALLY 3 TIMES DAILY Patient taking differently: APPLY 2 GRAMS TOPICALLY 3 TIMES DAILY PRN FOR PAIN 12/08/15   [name], MD  Ipratropium-Albuterol (COMBIVENT) 20-100 MCG/ACT AERS respimat Inhale 1 puff into the lungs every 6 (six) hours as needed for wheezing. Patient not taking: Reported on 05/16/2016 09/10/15   [name], MD  pantoprazole (PROTONIX) 40 MG tablet TAKE 1 TABLET (40 MG TOTAL) BY MOUTH DAILY. Patient not taking: Reported on 05/16/2016 04/01/15   Inda Castle, MD  tamsulosin [facility]) 0.4 MG CAPS capsule Take 1 capsule (0.4 mg total) by mouth daily after breakfast. Patient not  taking: Reported on 05/16/2016 10/07/14   [name], MD  triamcinolone ointment (KENALOG) 0.1 % APPLY TO AFFECTED AREA TWICE A DAY 12/08/15   [name], MD  ursodiol (ACTIGALL) 250 MG tablet TAKE 1 TABLET BY MOUTH TWICE A DAY Patient not taking: Reported on 05/16/2016 05/06/16   [name], MD   BP 148/55 mmHg  Pulse 53  Temp(Src) 98.1 F (36.7 C) (Oral)  Resp 16  SpO2 97% Physical Exam  Constitutional: He appears well-developed.  HENT:  Head: Atraumatic.  Eyes: EOM are normal.  Neck: Neck supple.  Cardiovascular: Normal rate.   Pulmonary/Chest: Effort normal. He has no wheezes. He has no rales.  Abdominal: There is tenderness.  Bilateral inguinal hernias. Right is freely reducible. Left had some tenderness but was reduced. No abdominal tenderness.  Musculoskeletal: He exhibits no edema.  Neurological: He is alert.  Skin: Skin is warm.    ED Course  Procedures (including critical care time) Labs Review Labs Reviewed  COMPREHENSIVE METABOLIC PANEL - Abnormal; Notable for the following:    BUN 47 (*)    Creatinine, Ser 1.90 (*)    Calcium 8.5 (*)    GFR calc non Af Amer 30 (*)    GFR calc Af Amer 34 (*)    All other components within normal limits  CBC - Abnormal; Notable for the following:    RBC 3.28 (*)    Hemoglobin 9.9 (*)    HCT 30.6 (*)    All other components within normal limits  URINALYSIS, ROUTINE W REFLEX MICROSCOPIC (NOT AT [facility]) - Abnormal; Notable for the following:    Hgb urine dipstick LARGE (*)    All other components within normal limits  URINE MICROSCOPIC-ADD ON - Abnormal; Notable for the following:    Squamous Epithelial / LPF 0-5 (*)    Bacteria, UA RARE (*)    All other components within normal limits  LIPASE, BLOOD    Imaging Review Dg Abd Acute W/chest  05/16/2016  CLINICAL DATA:  81 year old male with history of bilateral inguinal hernias and discomfort for the past 3 days EXAM: DG ABDOMEN ACUTE W/ 1V CHEST COMPARISON:  Chest x-ray  05/05/2016. FINDINGS: Lung volumes appear slightly increased and there are mild emphysematous changes noted. Architectural distortion at the base of the left lung, similar to prior studies, favored to reflect areas of chronic scarring. Small bilateral pleural effusions. No evidence of pulmonary edema. No suspicious appearing pulmonary nodules or masses. Heart size is normal. Upper mediastinal contours are within normal limits. Aortic atherosclerosis. Gas and stool are noted throughout the colon extending to the level of the distal rectum. No pathologic dilatation of small bowel is noted. Several nondilated gas-filled loops of small bowel are noted, predominantly in the left side of the abdomen. No pneumoperitoneum. IMPRESSION: 1. Nonobstructive bowel gas pattern. 2. No pneumoperitoneum. 3. Small bilateral pleural effusions. 4. Probable scarring the left lung base. 5. Aortic atherosclerosis. Electronically Signed   By: Vinnie Langton M.D.   On: 05/16/2016 15:32   I have personally reviewed and evaluated these images and lab results as part of my medical decision-making.   EKG Interpretation None      MDM   Final diagnoses:  Bilateral inguinal hernia without obstruction or gangrene, recurrence not specified    Patient with bilateral inguinal hernias. Slightly more difficulty with reduction on left side but both reduced. Mild abdominal pain has resolved. Will have discharge follow-up surgery.    Davonna Belling, MD 05/16/16 [phone]

## 2016-05-16 NOTE — ED Notes (Signed)
Patient presents for abdominal pain x2 days. No BM x3 days. Denies N/V, urinary symptoms. Sent from PCP. History of inguinal hernia.

## 2016-05-16 NOTE — ED Notes (Signed)
Pt comes in today with family at bedside with a c/o bilateral inguinal hernias. Pt states that he was picking something up when the pain occurred. Pt has had this pain before has been dealing with the hernias for a few years.

## 2016-05-16 NOTE — Telephone Encounter (Signed)
Patient needs to go ahead and get in and be checked. See if there are any openings this afternoon or call at 4:00 and give him an appointment for Tuesday. I will not be back in the office until Wednesday and I do not want him to wait that long.

## 2016-05-16 NOTE — ED Notes (Signed)
Patient made aware of need for urine specimen.

## 2016-05-19 ENCOUNTER — Telehealth: Payer: Self-pay

## 2016-05-19 NOTE — Telephone Encounter (Signed)
Patients wife would like Dr Everlene Farrier to call her as soon as possible states her husband is in so much pain he can not move and they went to ER and they sent them home.  Her call back number is [phone]

## 2016-05-20 ENCOUNTER — Encounter (HOSPITAL_COMMUNITY): Payer: Self-pay | Admitting: *Deleted

## 2016-05-20 ENCOUNTER — Telehealth: Payer: Self-pay

## 2016-05-20 ENCOUNTER — Inpatient Hospital Stay (HOSPITAL_COMMUNITY)
Admission: EM | Admit: 2016-05-20 | Discharge: 2016-06-02 | DRG: 350 | Disposition: A | Discharge status: Skilled Nursing Facility | Payer: Medicare Other | Attending: Internal Medicine | Admitting: Internal Medicine

## 2016-05-20 ENCOUNTER — Emergency Department (HOSPITAL_COMMUNITY): Payer: Medicare Other

## 2016-05-20 DIAGNOSIS — E1122 Type 2 diabetes mellitus with diabetic chronic kidney disease: Secondary | ICD-10-CM | POA: Diagnosis present

## 2016-05-20 DIAGNOSIS — I480 Paroxysmal atrial fibrillation: Secondary | ICD-10-CM | POA: Diagnosis present

## 2016-05-20 DIAGNOSIS — I25118 Atherosclerotic heart disease of native coronary artery with other forms of angina pectoris: Secondary | ICD-10-CM | POA: Diagnosis not present

## 2016-05-20 DIAGNOSIS — K219 Gastro-esophageal reflux disease without esophagitis: Secondary | ICD-10-CM | POA: Diagnosis present

## 2016-05-20 DIAGNOSIS — Z7901 Long term (current) use of anticoagulants: Secondary | ICD-10-CM

## 2016-05-20 DIAGNOSIS — N189 Chronic kidney disease, unspecified: Secondary | ICD-10-CM

## 2016-05-20 DIAGNOSIS — E440 Moderate protein-calorie malnutrition: Secondary | ICD-10-CM | POA: Insufficient documentation

## 2016-05-20 DIAGNOSIS — K5909 Other constipation: Secondary | ICD-10-CM | POA: Diagnosis present

## 2016-05-20 DIAGNOSIS — J449 Chronic obstructive pulmonary disease, unspecified: Secondary | ICD-10-CM | POA: Diagnosis present

## 2016-05-20 DIAGNOSIS — IMO0001 Reserved for inherently not codable concepts without codable children: Secondary | ICD-10-CM | POA: Diagnosis present

## 2016-05-20 DIAGNOSIS — Z7952 Long term (current) use of systemic steroids: Secondary | ICD-10-CM

## 2016-05-20 DIAGNOSIS — N183 Chronic kidney disease, stage 3 unspecified: Secondary | ICD-10-CM | POA: Diagnosis present

## 2016-05-20 DIAGNOSIS — I2510 Atherosclerotic heart disease of native coronary artery without angina pectoris: Secondary | ICD-10-CM | POA: Diagnosis present

## 2016-05-20 DIAGNOSIS — E785 Hyperlipidemia, unspecified: Secondary | ICD-10-CM | POA: Diagnosis present

## 2016-05-20 DIAGNOSIS — R1032 Left lower quadrant pain: Secondary | ICD-10-CM | POA: Diagnosis present

## 2016-05-20 DIAGNOSIS — R059 Cough, unspecified: Secondary | ICD-10-CM

## 2016-05-20 DIAGNOSIS — F0390 Unspecified dementia without behavioral disturbance: Secondary | ICD-10-CM

## 2016-05-20 DIAGNOSIS — I482 Chronic atrial fibrillation: Secondary | ICD-10-CM | POA: Diagnosis present

## 2016-05-20 DIAGNOSIS — R0602 Shortness of breath: Secondary | ICD-10-CM

## 2016-05-20 DIAGNOSIS — R109 Unspecified abdominal pain: Secondary | ICD-10-CM

## 2016-05-20 DIAGNOSIS — R410 Disorientation, unspecified: Secondary | ICD-10-CM | POA: Diagnosis not present

## 2016-05-20 DIAGNOSIS — N179 Acute kidney failure, unspecified: Secondary | ICD-10-CM | POA: Diagnosis present

## 2016-05-20 DIAGNOSIS — I130 Hypertensive heart and chronic kidney disease with heart failure and stage 1 through stage 4 chronic kidney disease, or unspecified chronic kidney disease: Secondary | ICD-10-CM | POA: Diagnosis present

## 2016-05-20 DIAGNOSIS — D638 Anemia in other chronic diseases classified elsewhere: Secondary | ICD-10-CM | POA: Diagnosis present

## 2016-05-20 DIAGNOSIS — K5900 Constipation, unspecified: Secondary | ICD-10-CM | POA: Diagnosis present

## 2016-05-20 DIAGNOSIS — N401 Enlarged prostate with lower urinary tract symptoms: Secondary | ICD-10-CM | POA: Diagnosis present

## 2016-05-20 DIAGNOSIS — R531 Weakness: Secondary | ICD-10-CM

## 2016-05-20 DIAGNOSIS — K4021 Bilateral inguinal hernia, without obstruction or gangrene, recurrent: Secondary | ICD-10-CM | POA: Diagnosis not present

## 2016-05-20 DIAGNOSIS — K743 Primary biliary cirrhosis: Secondary | ICD-10-CM | POA: Diagnosis present

## 2016-05-20 DIAGNOSIS — R1310 Dysphagia, unspecified: Secondary | ICD-10-CM | POA: Diagnosis present

## 2016-05-20 DIAGNOSIS — Z66 Do not resuscitate: Secondary | ICD-10-CM | POA: Diagnosis present

## 2016-05-20 DIAGNOSIS — B359 Dermatophytosis, unspecified: Secondary | ICD-10-CM | POA: Diagnosis present

## 2016-05-20 DIAGNOSIS — R338 Other retention of urine: Secondary | ICD-10-CM | POA: Diagnosis present

## 2016-05-20 DIAGNOSIS — Z8719 Personal history of other diseases of the digestive system: Secondary | ICD-10-CM

## 2016-05-20 DIAGNOSIS — K4020 Bilateral inguinal hernia, without obstruction or gangrene, not specified as recurrent: Secondary | ICD-10-CM | POA: Diagnosis present

## 2016-05-20 DIAGNOSIS — Z515 Encounter for palliative care: Secondary | ICD-10-CM

## 2016-05-20 DIAGNOSIS — Z7951 Long term (current) use of inhaled steroids: Secondary | ICD-10-CM

## 2016-05-20 DIAGNOSIS — Z681 Body mass index (BMI) 19 or less, adult: Secondary | ICD-10-CM

## 2016-05-20 DIAGNOSIS — Z7189 Other specified counseling: Secondary | ICD-10-CM

## 2016-05-20 DIAGNOSIS — E860 Dehydration: Secondary | ICD-10-CM | POA: Diagnosis present

## 2016-05-20 DIAGNOSIS — Z79899 Other long term (current) drug therapy: Secondary | ICD-10-CM

## 2016-05-20 DIAGNOSIS — I255 Ischemic cardiomyopathy: Secondary | ICD-10-CM | POA: Diagnosis present

## 2016-05-20 DIAGNOSIS — I252 Old myocardial infarction: Secondary | ICD-10-CM

## 2016-05-20 DIAGNOSIS — Z01810 Encounter for preprocedural cardiovascular examination: Secondary | ICD-10-CM

## 2016-05-20 DIAGNOSIS — I5042 Chronic combined systolic (congestive) and diastolic (congestive) heart failure: Secondary | ICD-10-CM | POA: Diagnosis present

## 2016-05-20 DIAGNOSIS — K7581 Nonalcoholic steatohepatitis (NASH): Secondary | ICD-10-CM | POA: Diagnosis present

## 2016-05-20 DIAGNOSIS — K4090 Unilateral inguinal hernia, without obstruction or gangrene, not specified as recurrent: Secondary | ICD-10-CM

## 2016-05-20 DIAGNOSIS — R05 Cough: Secondary | ICD-10-CM

## 2016-05-20 DIAGNOSIS — J690 Pneumonitis due to inhalation of food and vomit: Secondary | ICD-10-CM | POA: Diagnosis not present

## 2016-05-20 DIAGNOSIS — R634 Abnormal weight loss: Secondary | ICD-10-CM | POA: Diagnosis present

## 2016-05-20 DIAGNOSIS — Z87891 Personal history of nicotine dependence: Secondary | ICD-10-CM

## 2016-05-20 DIAGNOSIS — N289 Disorder of kidney and ureter, unspecified: Secondary | ICD-10-CM

## 2016-05-20 DIAGNOSIS — I10 Essential (primary) hypertension: Secondary | ICD-10-CM | POA: Diagnosis present

## 2016-05-20 DIAGNOSIS — Z8249 Family history of ischemic heart disease and other diseases of the circulatory system: Secondary | ICD-10-CM

## 2016-05-20 DIAGNOSIS — E119 Type 2 diabetes mellitus without complications: Secondary | ICD-10-CM

## 2016-05-20 HISTORY — DX: Chronic kidney disease, unspecified: N18.9

## 2016-05-20 HISTORY — DX: Long term (current) use of anticoagulants: Z79.01

## 2016-05-20 HISTORY — DX: Unspecified dementia, unspecified severity, without behavioral disturbance, psychotic disturbance, mood disturbance, and anxiety: F03.90

## 2016-05-20 LAB — BASIC METABOLIC PANEL
Anion gap: 5 (ref 5–15)
BUN: 55 mg/dL — ABNORMAL HIGH (ref 6–20)
CALCIUM: 8.5 mg/dL — AB (ref 8.9–10.3)
CHLORIDE: 106 mmol/L (ref 101–111)
CO2: 28 mmol/L (ref 22–32)
CREATININE: 1.75 mg/dL — AB (ref 0.61–1.24)
GFR, EST AFRICAN AMERICAN: 38 mL/min — AB (ref >60)
GFR, EST NON AFRICAN AMERICAN: 33 mL/min — AB (ref >60)
Glucose, Bld: 115 mg/dL — ABNORMAL HIGH (ref 65–99)
POTASSIUM: 4.1 mmol/L (ref 3.5–5.1)
Sodium: 139 mmol/L (ref 135–145)

## 2016-05-20 LAB — CBC
HEMATOCRIT: 28.2 % — AB (ref 39.0–52.0)
HEMOGLOBIN: 9.4 g/dL — AB (ref 13.0–17.0)
MCH: 30.8 pg (ref 26.0–34.0)
MCHC: 33.3 g/dL (ref 30.0–36.0)
MCV: 92.5 fL (ref 78.0–100.0)
Platelets: 171 10*3/uL (ref 150–400)
RBC: 3.05 MIL/uL — AB (ref 4.22–5.81)
RDW: 14.7 % (ref 11.5–15.5)
WBC: 8.1 10*3/uL (ref 4.0–10.5)

## 2016-05-20 LAB — GLUCOSE, CAPILLARY
Glucose-Capillary: 113 mg/dL — ABNORMAL HIGH (ref 65–99)
Glucose-Capillary: 151 mg/dL — ABNORMAL HIGH (ref 65–99)

## 2016-05-20 LAB — I-STAT CG4 LACTIC ACID, ED: Lactic Acid, Venous: 0.83 mmol/L (ref 0.5–1.9)

## 2016-05-20 MED ORDER — ACETAMINOPHEN 325 MG PO TABS
650.0000 mg | ORAL_TABLET | Freq: Four times a day (QID) | ORAL | Status: DC | PRN
  Administered 2016-05-21 – 2016-05-26 (×2): 650 mg via ORAL
  Filled 2016-05-20 (×2): qty 2

## 2016-05-20 MED ORDER — DIGOXIN 125 MCG PO TABS
125.0000 ug | ORAL_TABLET | Freq: Every day | ORAL | Status: DC
  Administered 2016-05-21 – 2016-05-23 (×2): 125 ug via ORAL
  Filled 2016-05-20 (×3): qty 1

## 2016-05-20 MED ORDER — FINASTERIDE 5 MG PO TABS
5.0000 mg | ORAL_TABLET | Freq: Every day | ORAL | Status: DC
Start: 2016-05-21 — End: 2016-06-02
  Administered 2016-05-21 – 2016-06-02 (×12): 5 mg via ORAL
  Filled 2016-05-20 (×13): qty 1

## 2016-05-20 MED ORDER — HYDROMORPHONE HCL 1 MG/ML IJ SOLN
0.2500 mg | INTRAMUSCULAR | Status: DC | PRN
  Administered 2016-05-20: 0.25 mg via INTRAVENOUS
  Filled 2016-05-20: qty 1

## 2016-05-20 MED ORDER — SIMVASTATIN 20 MG PO TABS
20.0000 mg | ORAL_TABLET | Freq: Every day | ORAL | Status: DC
  Administered 2016-05-20 – 2016-06-01 (×12): 20 mg via ORAL
  Filled 2016-05-20 (×2): qty 2
  Filled 2016-05-20 (×5): qty 1
  Filled 2016-05-20: qty 2
  Filled 2016-05-20 (×2): qty 1
  Filled 2016-05-20 (×2): qty 2

## 2016-05-20 MED ORDER — POLYETHYLENE GLYCOL 3350 17 G PO PACK
17.0000 g | PACK | Freq: Every day | ORAL | Status: DC
  Administered 2016-05-21 – 2016-06-02 (×10): 17 g via ORAL
  Filled 2016-05-20 (×12): qty 1

## 2016-05-20 MED ORDER — BISACODYL 10 MG RE SUPP: 10.0000 mg | Freq: Every day | RECTAL | Status: DC | PRN

## 2016-05-20 MED ORDER — SENNOSIDES-DOCUSATE SODIUM 8.6-50 MG PO TABS
1.0000 | ORAL_TABLET | Freq: Two times a day (BID) | ORAL | Status: DC
  Administered 2016-05-20 – 2016-06-01 (×21): 1 via ORAL
  Filled 2016-05-20 (×24): qty 1

## 2016-05-20 MED ORDER — PANTOPRAZOLE SODIUM 40 MG PO TBEC
40.0000 mg | DELAYED_RELEASE_TABLET | Freq: Every day | ORAL | Status: DC
  Administered 2016-05-21 – 2016-06-02 (×12): 40 mg via ORAL
  Filled 2016-05-20 (×13): qty 1

## 2016-05-20 MED ORDER — ACETAMINOPHEN 650 MG RE SUPP: 650.0000 mg | Freq: Four times a day (QID) | RECTAL | Status: DC | PRN

## 2016-05-20 MED ORDER — ALPRAZOLAM 0.25 MG PO TABS
0.2500 mg | ORAL_TABLET | Freq: Every evening | ORAL | Status: DC | PRN
  Administered 2016-05-21 (×2): 0.25 mg via ORAL
  Filled 2016-05-20 (×2): qty 1

## 2016-05-20 MED ORDER — DOXAZOSIN MESYLATE 2 MG PO TABS
2.0000 mg | ORAL_TABLET | Freq: Every day | ORAL | Status: DC
  Administered 2016-05-20 – 2016-06-01 (×12): 2 mg via ORAL
  Filled 2016-05-20 (×14): qty 1

## 2016-05-20 MED ORDER — CETYLPYRIDINIUM CHLORIDE 0.05 % MT LIQD
7.0000 mL | Freq: Two times a day (BID) | OROMUCOSAL | Status: DC
  Administered 2016-05-20 – 2016-06-02 (×20): 7 mL via OROMUCOSAL

## 2016-05-20 MED ORDER — FLUTICASONE FUROATE-VILANTEROL 200-25 MCG/INH IN AEPB
1.0000 | INHALATION_SPRAY | Freq: Every day | RESPIRATORY_TRACT | Status: DC
  Administered 2016-05-21 – 2016-06-02 (×11): 1 via RESPIRATORY_TRACT
  Filled 2016-05-20: qty 28

## 2016-05-20 MED ORDER — ISOSORBIDE MONONITRATE ER 30 MG PO TB24
30.0000 mg | ORAL_TABLET | Freq: Every day | ORAL | Status: DC
  Administered 2016-05-21 – 2016-06-02 (×11): 30 mg via ORAL
  Filled 2016-05-20 (×12): qty 1

## 2016-05-20 MED ORDER — TAMSULOSIN HCL 0.4 MG PO CAPS: 0.4000 mg | ORAL_CAPSULE | Freq: Every day | ORAL | Status: DC

## 2016-05-20 MED ORDER — ONDANSETRON HCL 4 MG/2ML IJ SOLN
4.0000 mg | Freq: Four times a day (QID) | INTRAMUSCULAR | Status: DC | PRN
  Administered 2016-05-20: 4 mg via INTRAVENOUS
  Filled 2016-05-20: qty 2

## 2016-05-20 MED ORDER — DONEPEZIL HCL 5 MG PO TABS
5.0000 mg | ORAL_TABLET | Freq: Every day | ORAL | Status: DC
  Administered 2016-05-20 – 2016-06-01 (×12): 5 mg via ORAL
  Filled 2016-05-20 (×12): qty 1

## 2016-05-20 MED ORDER — HYDROMORPHONE HCL 1 MG/ML IJ SOLN
0.5000 mg | INTRAMUSCULAR | Status: DC | PRN
  Administered 2016-05-20 – 2016-05-21 (×4): 0.5 mg via INTRAVENOUS
  Filled 2016-05-20 (×4): qty 1

## 2016-05-20 MED ORDER — SODIUM CHLORIDE 0.9 % IV SOLN
INTRAVENOUS | Status: AC
  Administered 2016-05-20: 19:00:00 via INTRAVENOUS

## 2016-05-20 MED ORDER — HYDRALAZINE HCL 20 MG/ML IJ SOLN
5.0000 mg | INTRAMUSCULAR | Status: DC | PRN
  Administered 2016-05-21 – 2016-05-22 (×2): 5 mg via INTRAVENOUS
  Filled 2016-05-20 (×2): qty 1

## 2016-05-20 MED ORDER — INSULIN ASPART 100 UNIT/ML ~~LOC~~ SOLN
0.0000 [IU] | Freq: Three times a day (TID) | SUBCUTANEOUS | Status: DC
  Administered 2016-05-21: 1 [IU] via SUBCUTANEOUS
  Administered 2016-05-21: 2 [IU] via SUBCUTANEOUS
  Administered 2016-05-23 – 2016-05-25 (×3): 1 [IU] via SUBCUTANEOUS

## 2016-05-20 MED ORDER — IPRATROPIUM-ALBUTEROL 20-100 MCG/ACT IN AERS: 1.0000 | INHALATION_SPRAY | Freq: Four times a day (QID) | RESPIRATORY_TRACT | Status: DC | PRN

## 2016-05-20 MED ORDER — ENSURE ENLIVE PO LIQD
237.0000 mL | Freq: Two times a day (BID) | ORAL | Status: DC
  Administered 2016-05-21 – 2016-05-28 (×6): 237 mL via ORAL

## 2016-05-20 MED ORDER — IPRATROPIUM-ALBUTEROL 0.5-2.5 (3) MG/3ML IN SOLN
3.0000 mL | Freq: Four times a day (QID) | RESPIRATORY_TRACT | Status: DC | PRN
  Administered 2016-05-29 – 2016-06-01 (×4): 3 mL via RESPIRATORY_TRACT
  Filled 2016-05-20 (×5): qty 3

## 2016-05-20 NOTE — H&P (Addendum)
History and Physical  Cory Lawrence [id number] DOB: February 21, 1925 DOA: 05/20/2016  Referring physician: Alfonse Spruce, MD PCP: [name], MD   Chief Complaint: Abdominal Pain  HPI: Cory Lawrence is a 82 y.o. male with a complex PMH detailed below presented to the ED for the second time in the last several days with severe recurrent LLQ abdominal pain related to bilateral inguinal hernias complicated by chronic constipation and generalized weakness from poor oral intake.  The patient and his wife report that 5-6 days ago the patient picked up something at home and afterwards had severe pain mostly on the left side lower quadrant of abdomen. He reports that his pain is in his inguinal region and that he noted that his hernia had protruded and felt hard like a baseball. He was seen 4 days ago in the emergency department for this issue and per notes had his hernia successfully reduced and was sent  Home with outpatient surgical follow-up. The patient and his wife report that it came right back out and the patient has been in severe pain over the last few days at home. He has had very little appetite and has not been able to eat much. He has only had 1 bowel movement over this time. His wife said she had to give him multiple laxatives in order for him to have a bowel movement. He says the pain can be extremely severe and at this morning it was extremely severe.   Many years ago he was diagnosed with the hernias and was offered a repair but his symptoms improved on their own and so he refused it.  He is chronically anticoagulated with Xarelto for chronic atrial fibrillation.  He has CAD and a completely occluded right coronary managed medically by cardiology.  He denies having chest pain.  He has COPD and chronic shortness of breath.  He also has chronic kidney disease and chronic anemia which has been stable.  He was evaluated by general surgery in ED and they requested inpatient admission for  medical clearance and surgical team will consider inguinal hernia repair.    Past Medical History  Diagnosis Date  . Dizziness   . Diaphoresis   . Palpitations   . COPD (chronic obstructive pulmonary disease) ([place])   . Hypertension   . Hyperlipidemia   . GERD (gastroesophageal reflux disease)   . BPH (benign prostatic hypertrophy)   . Personal history of colonic polyps 07/08/2002,06/2011    tubular adenoma, hyperplastic  . Internal hemorrhoids without mention of complication   . Diverticulosis of colon (without mention of hemorrhage)   . Biliary cirrhosis ([place])   . Unspecified gastritis and gastroduodenitis without mention of hemorrhage   . NASH (nonalcoholic steatohepatitis)   . Anxiety   . Arthritis   . Ulcer     Gastric/bleeding  . Diabetes mellitus type 2, diet-controlled ([place])     managed with diet  . Vitamin B12 deficiency   . PAF (paroxysmal atrial fibrillation) ([facility])     on Xarelto  . CAD (coronary artery disease)     prior abnormal Myoview; subsequent cath; managed medically  . Heart attack ([place])   . Chronic anticoagulation    Past Surgical History  Procedure Laterality Date  . Cataract extraction      bilateral  . Eye surgery      left eye x multiple  . Cardiac catheterization  March 2013    Managed medically   Social History:  reports that he  has quit smoking. He quit smokeless tobacco use about 19 years ago. He reports that he does not drink alcohol or use illicit drugs.  Allergies  Allergen Reactions  . Ciprofloxacin Hcl Swelling  . Penicillins Hives    Has patient had a PCN reaction causing immediate rash, facial/tongue/throat swelling, SOB or lightheadedness with hypotension: YES Has patient had a PCN reaction causing severe rash involving mucus membranes or skin necrosis: NO Has patient had a PCN reaction that required hospitalization NO Has patient had a PCN reaction occurring within the last 10 years: NO If all of the above answers are "NO", then may  proceed with Cephalosporin use.    Family History  Problem Relation Age of Onset  . Heart failure Mother   . Diabetes Mother   . Diabetes Sister   . Heart failure Sister   . Colon cancer Neg Hx   . Cancer Mother     unknown type   Review of Systems Constitutional: Positive for appetite change. Negative for fever, chills and fatigue.  HENT: Negative for congestion, postnasal drip, rhinorrhea and sinus pressure.  Eyes: Negative for visual disturbance.  Respiratory: Negative for chest tightness and shortness of breath.  Cardiovascular: Negative for chest pain and palpitations.  Gastrointestinal: Positive for nausea, abdominal pain and constipation. Negative for vomiting and diarrhea.  Genitourinary: Negative for dysuria, urgency and hematuria.  Musculoskeletal: Negative for myalgias and back pain.  Skin: Negative for rash.  Neurological: Negative for dizziness, weakness and headaches.  Hematological: Bruises/bleeds easily.  All other systems reviewed and reported negative.    Prior to Admission medications   Medication Sig Start Date End Date Taking? Authorizing Provider  ALPRAZolam (XANAX) 0.5 MG tablet TAKE 1/2 TABLET BY MOUTH AT BEDTIME MAY TAKE ADDITIONAL 1/2TABLET BY MOUTH AS NEEDED IF CANNOT SLEEP Patient taking differently: TAKE 0.25 MG BY MOUTH AT BEDTIME MAY TAKE ADDITIONAL 0.25 MG BY MOUTH AS NEEDED IF CANNOT SLEEP 02/02/16  Yes [name], MD  cyanocobalamin (,VITAMIN B-12,) 1000 MCG/ML injection INJECT 1 ML (1,000 MCG TOTAL) INTO THE MUSCLE EVERY 30 (THIRTY) DAYS. 02/25/14  Yes Inda Castle, MD  diclofenac sodium (VOLTAREN) 1 % GEL APPLY 2 GRAMS TOPICALLY 3 TIMES DAILY Patient taking differently: APPLY 2 GRAMS TOPICALLY 3 TIMES DAILY PRN FOR PAIN 12/08/15  Yes [name], MD  digoxin (LANOXIN) 0.125 MG tablet TAKE 1 TABLET BY MOUTH DAILY Patient taking differently: TAKE 0.125 MG BY MOUTH DAILY 03/19/16  Yes Ezekiel Slocumb, PA-C  donepezil (ARICEPT) 5 MG tablet Take 1  tablet (5 mg total) by mouth at bedtime. 05/05/16  Yes [name], MD  doxazosin (CARDURA) 4 MG tablet Take 1 tablet (4 mg total) by mouth at bedtime. Patient taking differently: Take 2 mg by mouth at bedtime.  12/10/15  Yes [name], MD  Fe-Succ Ac-C-Thre Ac-B12-FA (FERREX 150 FORTE PLUS) 50-100 MG CAPS Take 1 capsule by mouth daily. 11/19/12  Yes Sable Feil, MD  finasteride (PROSCAR) 5 MG tablet Take 1 tablet (5 mg total) by mouth daily. 10/07/14  Yes [name], MD  Fluticasone-Salmeterol (ADVAIR DISKUS) 250-50 MCG/DOSE AEPB Inhale 1 puff into the lungs as needed. Shortness of breath. Patient taking differently: Inhale 1 puff into the lungs at bedtime. Shortness of breath. 09/10/15  Yes [name], MD  ipratropium-albuterol (DUONEB) 0.5-2.5 (3) MG/3ML SOLN Take 3 mLs by nebulization every 6 (six) hours as needed. Patient taking differently: Take 3 mLs by nebulization every 6 (six)  hours as needed (sob and wheezing).  01/07/16  Yes [name], MD  isosorbide mononitrate (IMDUR) 30 MG 24 hr tablet TAKE 1 TABLET (30 MG TOTAL) BY MOUTH DAILY. 01/09/16  Yes [name], MD  Melatonin 3 MG CAPS Take 1.5 mg by mouth at bedtime as needed (sleep). Sleep    Yes Historical Provider, MD  predniSONE (DELTASONE) 1 MG tablet TAKE 4 TABLETS DAILY FOR DOSAGE OF 4 MG A DAY Patient taking differently: TAKE 5 MG BY MOUTH DAILY 02/16/16  Yes [name], MD  simvastatin (ZOCOR) 40 MG tablet TAKE 1 TABLET BY MOUTH EVERY DAY AT BEDTIME Patient taking differently: TAKE 0.5 TABLET BY MOUTH EVERY DAY AT BEDTIME 01/16/16  Yes [name], MD  triamcinolone ointment (KENALOG) 0.1 % APPLY TO AFFECTED AREA TWICE A DAY 12/08/15  Yes [name], MD  XARELTO 15 MG TABS tablet TAKE 1 TABLET BY MOUTH EVERY DAY Patient taking differently: TAKE 15 MG BY MOUTH DAILY 01/09/16  Yes [name], MD  doxycycline (VIBRA-TABS) 100 MG tablet Take 1 tablet (100 mg total) by mouth 2 (two) times daily. Patient not  taking: Reported on 05/20/2016 05/05/16   [name], MD  Ipratropium-Albuterol (COMBIVENT) 20-100 MCG/ACT AERS respimat Inhale 1 puff into the lungs every 6 (six) hours as needed for wheezing. Patient not taking: Reported on 05/16/2016 09/10/15   [name], MD  pantoprazole (PROTONIX) 40 MG tablet TAKE 1 TABLET (40 MG TOTAL) BY MOUTH DAILY. Patient not taking: Reported on 05/16/2016 04/01/15   Inda Castle, MD  tamsulosin [facility]) 0.4 MG CAPS capsule Take 1 capsule (0.4 mg total) by mouth daily after breakfast. Patient not taking: Reported on 05/16/2016 10/07/14   [name], MD  ursodiol (ACTIGALL) 250 MG tablet TAKE 1 TABLET BY MOUTH TWICE A DAY Patient not taking: Reported on 05/16/2016 05/06/16   [name], MD   Physical Exam: Filed Vitals:   05/20/16 1402 05/20/16 1707  BP: 135/57 153/58  Pulse: 68 63  Temp: 98 F (36.7 C)   TempSrc: Oral   Resp: 19 14  Height: 5\' 5"  (1.651 m)   Weight: 54.432 kg (120 lb)   SpO2: 94% 93%    Constitutional: He is oriented to person, place, and time. He appears well-developed and well-nourished. No distress.  HENT: Head: Normocephalic and atraumatic.  Mouth/Throat: Oropharynx is clear and moist. No oropharyngeal exudate.  Eyes: EOM are normal. Pupils are equal, round, and reactive to light.  Neck: Normal range of motion. Neck supple.  Cardiovascular: Normal rate, regular rhythm, normal heart sounds and intact distal pulses.No murmur heard. Pulmonary/Chest: Effort normal. No respiratory distress. He has no wheezes. He has no rales.  Abdominal: Soft. He exhibits no distension. There is tenderness (LLQ). A hernia is present. Hernia confirmed positive in the left inguinal area (protruding with mild to moderate tenderness).  Musculoskeletal: Normal range of motion. He exhibits no edema.  Neurological: He is alert and oriented to person, place, and time.   Labs on Admission:  Basic Metabolic Panel:  Recent Labs Lab 05/16/16 1454  05/20/16 1508  NA 136 139  K 4.1 4.1  CL 105 106  CO2 24 28  GLUCOSE 98 115*  BUN 47* 55*  CREATININE 1.90* 1.75*  CALCIUM 8.5* 8.5*   Liver Function Tests:  Recent Labs Lab 05/16/16 1454  AST 23  ALT 17  ALKPHOS 73  BILITOT 0.9  PROT 7.0  ALBUMIN 3.5  Recent Labs Lab 05/16/16 1454  LIPASE 23   No results for input(s): AMMONIA in the last 168 hours. CBC:  Recent Labs Lab 05/16/16 1454 05/20/16 1508  WBC 9.6 8.1  HGB 9.9* 9.4*  HCT 30.6* 28.2*  MCV 93.3 92.5  PLT 211 171   Cardiac Enzymes: No results for input(s): CKTOTAL, CKMB, CKMBINDEX, TROPONINI in the last 168 hours.  BNP (last 3 results) No results for input(s): PROBNP in the last 8760 hours. CBG: No results for input(s): GLUCAP in the last 168 hours.  Radiological Exams on Admission: Dg Abd Acute W/chest  05/20/2016  CLINICAL DATA:  Left lower abdominal pain for 4-5 days EXAM: DG ABDOMEN ACUTE W/ 1V CHEST COMPARISON:  05/16/2016 FINDINGS: There is no evidence of dilated bowel loops or free intraperitoneal air. No radiopaque calculi or other significant radiographic abnormality is seen. Heart size and mediastinal contours are within normal limits. The lungs are hyperinflated likely secondary to COPD. IMPRESSION: Negative abdominal radiographs.  No acute cardiopulmonary disease. Electronically Signed   By: Kathreen Devoid   On: 05/20/2016 15:37   EKG reviewed.   Assessment/Plan  LLQ abdominal pain - secondary to recurrent left inguinal hernia, reduced in ED but has had multiple recurrences, also has chronic constipation. Admit for symptom management, pain control, Gen surgery consult.  Bilateral inguinal hernia without obstruction or gangrene - as above, will maximize medical therapy in anticipation for hernia repair next week, will ask cardiology for surgery clearance. He is high risk for surgery given his many medical co-morbidities and he accepts and understands this risk but wants to proceed with  having hernia repair if possible. Will defer to surgical team.      CAD (coronary artery disease) - patient has an occluded right coronary that is being medically managed, will consult cardiology for assistance with surgical clearance.  He is followed by Grayland Gustavus, MD. Check EKG. His is completely asymptomatic at this time.      Cirrhosis of liver not due to alcohol  - secondary to steatosis, liver enzymes stable.      COPD with bronchitis - continue regular use of home respiratory medications and supplemental oxygen as needed.   Chronic Atrial Fibrillation - check EKG, holding Xarelto for now in anticipation for surgery next week.   Weight loss - suspect secondary to poor oral intake with abdominal pain related to hernias.   Chronic anticoagulation - would hold Xarelto for now as noted above.   Hypertension - fairly well controlled on current medications, will continue them and follow.   Diabetes mellitus type 2, diet-controlled - monitor blood glucose while in hospital for stress hyperglycemia, sliding scale coverage ordered, A1c pending.     Constipation - scheduled laxatives ordered.  He had a bowel movement yesterday after taking miralax for 5 days with mineral oil.    Anemia, chronic disease - stable hemoglobin, will follow.   CKD (chronic kidney disease) stage 3, GFR 30-59 ml/min - avoid nephrotoxins, renally dose medications, Cr stable from recent testing.  Gently IVF hydration as he is mildly dehydrated. Recheck BMP in AM.    History of gastroesophageal reflux (GERD) - protonix ordered for GI protection.     Generalized weakness - PT evaluation requested.    DVT Prophylaxis: mechanical Code Status: FULL Family Communication: at bedside  Disposition Plan: TBD  Time spent: 44 mins  Irwin Brakeman, MD [facility] Pager [phone]  If 7PM-7AM, please contact night-coverage [email or web address] Password [email or web address] 05/20/2016, 5:15 PM

## 2016-05-20 NOTE — ED Notes (Signed)
Attempted to call floor to give report-states they will call back.

## 2016-05-20 NOTE — Telephone Encounter (Signed)
Call Mr. Gipe's wife. Patient needs to go back to the ER. If he is not able to ambulate she needs to call EMS for transport and reevaluation. I work again Saturday and Monday and can follow-up with him Saturday or Monday once he completes his ER re-evaluation

## 2016-05-20 NOTE — ED Notes (Addendum)
Pt has bil hernias, left side hard and painful, seen here on Monday for same, Poor appetite, constipation, severe pain

## 2016-05-20 NOTE — Telephone Encounter (Signed)
Per Dr. Everlene Farrier, called patient's wife and advised her to take patient to ED.  If she is unable to transport patient by private vehicle then, she should call EMS.  Dr. Everlene Farrier had me advise patient's wife that if patient is not admitted that she can bring him to our office Saturday to see Dr. Everlene Farrier.

## 2016-05-20 NOTE — Consult Note (Signed)
Reason for Consult:  Recurrent  Inguinal  Referring Physician:  Dr. Corky Downs  Cory Lawrence is an 82 y.o. male.  HPI: Pt with hx of inguinal hernias for many years.  He is usually not bothered with them. Last Saturday 7/15/ 17 he moved a large limb that had fallen and started having pain that evening.  By Sunday 05/15/16 he was having so much pain he could not get OOB, except to go to the BR.  He was not having BM's for 2-3 days at that point and pain was progressively worse.  He presented to the ED on 05/16/16, with pain for 2 days and unable to have a BM for 3 days, no nausea, vomiting or urinary symptoms.  They were able to reduce it and he was to follow up with General surgery.  He has an appointment, but it's more than a week away.   His wife called the PCP today complaining of severe pain, unable to get OOB secondary to the pain, he can eat some, but not hungry.  He has been able to void, some nausea, no vomiting since he was seen on Monday 05/16/16.  He is having more pain now and his wife called the PCP.  They referred him back to the ED.  He presented again today with severe pain left side, it was hard, he cannot eat, and ongoing constipation. 1 BM that was normal on perhaps Wednesday.   Dr. Alfonse Spruce was able to reduce the hernia but it came out again while she was talking with me in the ED.  He has multiple medical issues and is on Xarelto.  .  ED evaluation shows he is afebrile, VSS, Creatinine is elevated, he is anemic but WBC is normal.  We are ask to see.  We are ask to see.  He is complaining of pain on the left inguinal area, this is reduced but there is some palpable areas LLQ that seem hard.  No nausea or vomiting just pain.  He also has a RIH that is easily reducible.   With the recurring left and right hernia, severe pain with the left side, unable to eat, painful to walk we were ask to see here in the hospital.     Past Medical History  Diagnosis Date  . Dizziness   .  Diaphoresis   . Palpitations   . COPD (chronic obstructive pulmonary disease) ([place])   . Hypertension   . Hyperlipidemia   . GERD (gastroesophageal reflux disease)   . BPH (benign prostatic hypertrophy)   . Personal history of colonic polyps 07/08/2002,06/2011    tubular adenoma, hyperplastic  . Internal hemorrhoids without mention of complication   . Diverticulosis of colon (without mention of hemorrhage)   . Biliary cirrhosis ([place])   . Unspecified gastritis and gastroduodenitis without mention of hemorrhage   . NASH (nonalcoholic steatohepatitis)   . Anxiety   . Arthritis   . Ulcer     Gastric/bleeding  . Diabetes mellitus type 2, diet-controlled ([place])     managed with diet  . Vitamin B12 deficiency   . PAF (paroxysmal atrial fibrillation) ([facility])     on Xarelto  . CAD (coronary artery disease)     prior abnormal Myoview; subsequent cath; managed medically  . Heart attack [facility])     Past Surgical History  Procedure Laterality Date  . Cataract extraction      bilateral  . Eye surgery      left eye x multiple  .  Cardiac catheterization  March 2013    Managed medically    Family History  Problem Relation Age of Onset  . Heart failure Mother   . Diabetes Mother   . Diabetes Sister   . Heart failure Sister   . Colon cancer Neg Hx   . Cancer Mother     unknown type    Social History:  reports that he has quit smoking. He quit smokeless tobacco use about 19 years ago. He reports that he does not drink alcohol or use illicit drugs. Tobacco:  40 years 1 plus packs per day ETOH:  None Drugs:  None Married 51 years, wife is with him   Allergies:  Allergies  Allergen Reactions  . Ciprofloxacin Hcl Swelling  . Penicillins Hives    Has patient had a PCN reaction causing immediate rash, facial/tongue/throat swelling, SOB or lightheadedness with hypotension: YES Has patient had a PCN reaction causing severe rash involving mucus membranes or skin necrosis: NO Has patient had  a PCN reaction that required hospitalization NO Has patient had a PCN reaction occurring within the last 10 years: NO If all of the above answers are "NO", then may proceed with Cephalosporin use.    Prior to Admission medications   Medication Sig Start Date End Date Taking? Authorizing Provider  ALPRAZolam (XANAX) 0.5 MG tablet TAKE 1/2 TABLET BY MOUTH AT BEDTIME MAY TAKE ADDITIONAL 1/2TABLET BY MOUTH AS NEEDED IF CANNOT SLEEP Patient taking differently: TAKE 0.25 MG BY MOUTH AT BEDTIME MAY TAKE ADDITIONAL 0.25 MG BY MOUTH AS NEEDED IF CANNOT SLEEP 02/02/16  Yes [name], MD  cyanocobalamin (,VITAMIN B-12,) 1000 MCG/ML injection INJECT 1 ML (1,000 MCG TOTAL) INTO THE MUSCLE EVERY 30 (THIRTY) DAYS. 02/25/14  Yes Inda Castle, MD  diclofenac sodium (VOLTAREN) 1 % GEL APPLY 2 GRAMS TOPICALLY 3 TIMES DAILY Patient taking differently: APPLY 2 GRAMS TOPICALLY 3 TIMES DAILY PRN FOR PAIN 12/08/15  Yes [name], MD  digoxin (LANOXIN) 0.125 MG tablet TAKE 1 TABLET BY MOUTH DAILY Patient taking differently: TAKE 0.125 MG BY MOUTH DAILY 03/19/16  Yes Ezekiel Slocumb, PA-C  donepezil (ARICEPT) 5 MG tablet Take 1 tablet (5 mg total) by mouth at bedtime. 05/05/16  Yes [name], MD  doxazosin (CARDURA) 4 MG tablet Take 1 tablet (4 mg total) by mouth at bedtime. Patient taking differently: Take 2 mg by mouth at bedtime.  12/10/15  Yes [name], MD  Fe-Succ Ac-C-Thre Ac-B12-FA (FERREX 150 FORTE PLUS) 50-100 MG CAPS Take 1 capsule by mouth daily. 11/19/12  Yes Sable Feil, MD  finasteride (PROSCAR) 5 MG tablet Take 1 tablet (5 mg total) by mouth daily. 10/07/14  Yes [name], MD  Fluticasone-Salmeterol (ADVAIR DISKUS) 250-50 MCG/DOSE AEPB Inhale 1 puff into the lungs as needed. Shortness of breath. Patient taking differently: Inhale 1 puff into the lungs at bedtime. Shortness of breath. 09/10/15  Yes [name], MD  ipratropium-albuterol (DUONEB) 0.5-2.5 (3) MG/3ML SOLN Take 3 mLs by  nebulization every 6 (six) hours as needed. Patient taking differently: Take 3 mLs by nebulization every 6 (six) hours as needed (sob and wheezing).  01/07/16  Yes [name], MD  isosorbide mononitrate (IMDUR) 30 MG 24 hr tablet TAKE 1 TABLET (30 MG TOTAL) BY MOUTH DAILY. 01/09/16  Yes [name], MD  Melatonin 3 MG CAPS Take 1.5 mg by mouth at bedtime as needed (sleep). Sleep    Yes Historical Provider, MD  predniSONE (DELTASONE) 1 MG tablet TAKE 4 TABLETS DAILY FOR DOSAGE OF 4 MG A DAY Patient taking differently: TAKE 5 MG BY MOUTH DAILY 02/16/16  Yes [name], MD  simvastatin (ZOCOR) 40 MG tablet TAKE 1 TABLET BY MOUTH EVERY DAY AT BEDTIME Patient taking differently: TAKE 0.5 TABLET BY MOUTH EVERY DAY AT BEDTIME 01/16/16  Yes [name], MD  triamcinolone ointment (KENALOG) 0.1 % APPLY TO AFFECTED AREA TWICE A DAY 12/08/15  Yes [name], MD  XARELTO 15 MG TABS tablet TAKE 1 TABLET BY MOUTH EVERY DAY Patient taking differently: TAKE 15 MG BY MOUTH DAILY 01/09/16  Yes [name], MD  doxycycline (VIBRA-TABS) 100 MG tablet Take 1 tablet (100 mg total) by mouth 2 (two) times daily. Patient not taking: Reported on 05/20/2016 05/05/16   [name], MD  Ipratropium-Albuterol (COMBIVENT) 20-100 MCG/ACT AERS respimat Inhale 1 puff into the lungs every 6 (six) hours as needed for wheezing. Patient not taking: Reported on 05/16/2016 09/10/15   [name], MD  pantoprazole (PROTONIX) 40 MG tablet TAKE 1 TABLET (40 MG TOTAL) BY MOUTH DAILY. Patient not taking: Reported on 05/16/2016 04/01/15   Inda Castle, MD  tamsulosin [facility]) 0.4 MG CAPS capsule Take 1 capsule (0.4 mg total) by mouth daily after breakfast. Patient not taking: Reported on 05/16/2016 10/07/14   [name], MD  ursodiol (ACTIGALL) 250 MG tablet TAKE 1 TABLET BY MOUTH TWICE A DAY Patient not taking: Reported on 05/16/2016 05/06/16   [name], MD     Results for orders placed or performed during the hospital  encounter of 05/20/16 (from the past 48 hour(s))  CBC     Status: Abnormal   Collection Time: 05/20/16  3:08 PM  Result Value Ref Range   WBC 8.1 4.0 - 10.5 K/uL   RBC 3.05 (L) 4.22 - 5.81 MIL/uL   Hemoglobin 9.4 (L) 13.0 - 17.0 g/dL   HCT 28.2 (L) 39.0 - 52.0 %   MCV 92.5 78.0 - 100.0 fL   MCH 30.8 26.0 - 34.0 pg   MCHC 33.3 30.0 - 36.0 g/dL   RDW 14.7 11.5 - 15.5 %   Platelets 171 150 - 400 K/uL  I-Stat CG4 Lactic Acid, ED     Status: None   Collection Time: 05/20/16  3:16 PM  Result Value Ref Range   Lactic Acid, Venous 0.83 0.5 - 1.9 mmol/L    Dg Abd Acute W/chest  05/20/2016  CLINICAL DATA:  Left lower abdominal pain for 4-5 days EXAM: DG ABDOMEN ACUTE W/ 1V CHEST COMPARISON:  05/16/2016 FINDINGS: There is no evidence of dilated bowel loops or free intraperitoneal air. No radiopaque calculi or other significant radiographic abnormality is seen. Heart size and mediastinal contours are within normal limits. The lungs are hyperinflated likely secondary to COPD. IMPRESSION: Negative abdominal radiographs.  No acute cardiopulmonary disease. Electronically Signed   By: Kathreen Devoid   On: 05/20/2016 15:37    Review of Systems  Constitutional: Positive for weight loss (65 pounds last 2 years). Negative for fever and chills.  HENT: Negative.   Eyes: Negative.   Respiratory: Positive for wheezing (better now on nebulizers at home.  ).        Bronchitis in March but better with nebulizer treatments at home.    Cardiovascular: Positive for leg swelling (he's been in bed all week so they are not swollen now.). Negative for orthopnea and claudication.  Gastrointestinal: Positive for  heartburn, nausea, abdominal pain (pain LLQ, more severe.  ) and constipation. Negative for vomiting, diarrhea, blood in stool and melena.  Genitourinary:       Slow  Musculoskeletal: Negative.   Skin: Negative.   Neurological: Negative.   Endo/Heme/Allergies: Bruises/bleeds easily (on Xarelto).   Psychiatric/Behavioral: Negative.    Blood pressure 135/57, pulse 68, temperature 98 F (36.7 C), temperature source Oral, resp. rate 19, height 5\' 5"  (1.651 m), weight 54.432 kg (120 lb), SpO2 94 %. Physical Exam  Constitutional: He is oriented to person, place, and time. No distress.  Elderly frail 82 y/o male, having some pain on the left.  He points to pain being in Left inguinal area.    HENT:  Head: Normocephalic and atraumatic.  Nose: Nose normal.  Eyes: Right eye exhibits no discharge. Left eye exhibits no discharge. No scleral icterus.  Neck: Normal range of motion. Neck supple. No JVD present. No tracheal deviation present. No thyromegaly present.  GI: Soft. He exhibits mass (SB LLq reduced back in but feels a little indurated). He exhibits no distension. There is tenderness (lower abdomen left side). There is no rebound and no guarding.  Musculoskeletal: He exhibits no edema or tenderness.  Lymphadenopathy:    He has no cervical adenopathy.  Neurological: He is alert and oriented to person, place, and time. No cranial nerve deficit.  Skin: Skin is warm and dry. No rash noted. He is not diaphoretic. No erythema. No pallor.  Psychiatric: He has a normal mood and affect. His behavior is normal. Judgment and thought content normal.    Assessment/Plan: Painful recurrent left inguinal hernia, now reduced RIH CAD HYPERTENSION AF on Xarelto COPD on Nebulizers/steroids AODM - well controlled A1C 5.7 12/31/15 Hx of GI bleed Acute on chronic renal insuffiencey NASH BPH Dyslipidemia   Plan:  I would recommend medical admission, hold his Xarelto, cardiology and medical clearance, Hydration over the weekend.  Try to get him set up for repair of his hernia early next week.     Cory Lawrence 05/20/2016, 3:54 PM

## 2016-05-20 NOTE — ED Provider Notes (Signed)
CSN: [id number]     Arrival date & time 05/20/16  1358 History   First MD Initiated Contact with Patient 05/20/16 1450     Chief Complaint  Patient presents with  . Hernia     (Consider location/radiation/quality/duration/timing/severity/associated sxs/prior Treatment) HPI Comments: 82 year old male with history of bilateral inguinal hernias, COPD, hypertension, hyperlipidemia presents for abdominal pain. The patient and his wife report that the patient was seen here 4 days ago for the same. They report that 5-6 days ago the patient picked up something and afterwards had severe pain mostly on the left side. He reports that his pain is in his inguinal region and that he noted that his hernia had protruded and felt hard like a baseball. He was seen 4 days ago in the emergency department for this issue and per notes had his hernia successfully reduced. The patient and his wife report that if it was reduced it came right back out and the patient has been in severe pain over the last few days at home. He has had very little appetite and has not been able to eat much. He has only had 1 bowel movement over this time. His wife said she had to give him multiple laxatives in order for him to have a bowel movement. He says the pain can be extremely severe and at this morning it was extremely severe. He says that he will help to ease the pain. Many years ago he was diagnosed with the hernias and was offered a repair but his symptoms improved on their own and so he refused it.   Past Medical History  Diagnosis Date  . Dizziness   . Diaphoresis   . Palpitations   . COPD (chronic obstructive pulmonary disease) ([place])   . Hypertension   . Hyperlipidemia   . GERD (gastroesophageal reflux disease)   . BPH (benign prostatic hypertrophy)   . Personal history of colonic polyps 07/08/2002,06/2011    tubular adenoma, hyperplastic  . Internal hemorrhoids without mention of complication   . Diverticulosis of colon  (without mention of hemorrhage)   . Biliary cirrhosis ([place])   . Unspecified gastritis and gastroduodenitis without mention of hemorrhage   . NASH (nonalcoholic steatohepatitis)   . Anxiety   . Arthritis   . Ulcer     Gastric/bleeding  . Diabetes mellitus type 2, diet-controlled ([place])     managed with diet  . Vitamin B12 deficiency   . PAF (paroxysmal atrial fibrillation) ([facility])     on Xarelto  . CAD (coronary artery disease)     prior abnormal Myoview; subsequent cath; managed medically  . Heart attack [facility])    Past Surgical History  Procedure Laterality Date  . Cataract extraction      bilateral  . Eye surgery      left eye x multiple  . Cardiac catheterization  March 2013    Managed medically   Family History  Problem Relation Age of Onset  . Heart failure Mother   . Diabetes Mother   . Diabetes Sister   . Heart failure Sister   . Colon cancer Neg Hx   . Cancer Mother     unknown type   Social History  Substance Use Topics  . Smoking status: Former Research scientist (life sciences)  . Smokeless tobacco: Former Systems developer    Quit date: 05/31/1996  . Alcohol Use: No    Review of Systems  Constitutional: Positive for appetite change. Negative for fever, chills and fatigue.  HENT: Negative for  congestion, postnasal drip, rhinorrhea and sinus pressure.   Eyes: Negative for visual disturbance.  Respiratory: Negative for chest tightness and shortness of breath.   Cardiovascular: Negative for chest pain and palpitations.  Gastrointestinal: Positive for nausea, abdominal pain and constipation. Negative for vomiting and diarrhea.  Genitourinary: Negative for dysuria, urgency and hematuria.  Musculoskeletal: Negative for myalgias and back pain.  Skin: Negative for rash.  Neurological: Negative for dizziness, weakness and headaches.  Hematological: Bruises/bleeds easily.      Allergies  Ciprofloxacin hcl and Penicillins  Home Medications   Prior to Admission medications   Medication Sig Start  Date End Date Taking? Authorizing Provider  ALPRAZolam (XANAX) 0.5 MG tablet TAKE 1/2 TABLET BY MOUTH AT BEDTIME MAY TAKE ADDITIONAL 1/2TABLET BY MOUTH AS NEEDED IF CANNOT SLEEP Patient taking differently: TAKE 0.25 MG BY MOUTH AT BEDTIME MAY TAKE ADDITIONAL 0.25 MG BY MOUTH AS NEEDED IF CANNOT SLEEP 02/02/16  Yes [name], MD  cyanocobalamin (,VITAMIN B-12,) 1000 MCG/ML injection INJECT 1 ML (1,000 MCG TOTAL) INTO THE MUSCLE EVERY 30 (THIRTY) DAYS. 02/25/14  Yes Inda Castle, MD  diclofenac sodium (VOLTAREN) 1 % GEL APPLY 2 GRAMS TOPICALLY 3 TIMES DAILY Patient taking differently: APPLY 2 GRAMS TOPICALLY 3 TIMES DAILY PRN FOR PAIN 12/08/15  Yes [name], MD  digoxin (LANOXIN) 0.125 MG tablet TAKE 1 TABLET BY MOUTH DAILY Patient taking differently: TAKE 0.125 MG BY MOUTH DAILY 03/19/16  Yes Ezekiel Slocumb, PA-C  donepezil (ARICEPT) 5 MG tablet Take 1 tablet (5 mg total) by mouth at bedtime. 05/05/16  Yes [name], MD  doxazosin (CARDURA) 4 MG tablet Take 1 tablet (4 mg total) by mouth at bedtime. Patient taking differently: Take 2 mg by mouth at bedtime.  12/10/15  Yes [name], MD  Fe-Succ Ac-C-Thre Ac-B12-FA (FERREX 150 FORTE PLUS) 50-100 MG CAPS Take 1 capsule by mouth daily. 11/19/12  Yes Sable Feil, MD  finasteride (PROSCAR) 5 MG tablet Take 1 tablet (5 mg total) by mouth daily. 10/07/14  Yes [name], MD  Fluticasone-Salmeterol (ADVAIR DISKUS) 250-50 MCG/DOSE AEPB Inhale 1 puff into the lungs as needed. Shortness of breath. Patient taking differently: Inhale 1 puff into the lungs at bedtime. Shortness of breath. 09/10/15  Yes [name], MD  ipratropium-albuterol (DUONEB) 0.5-2.5 (3) MG/3ML SOLN Take 3 mLs by nebulization every 6 (six) hours as needed. Patient taking differently: Take 3 mLs by nebulization every 6 (six) hours as needed (sob and wheezing).  01/07/16  Yes [name], MD  isosorbide mononitrate (IMDUR) 30 MG 24 hr tablet TAKE 1 TABLET (30 MG TOTAL) BY  MOUTH DAILY. 01/09/16  Yes [name], MD  Melatonin 3 MG CAPS Take 1.5 mg by mouth at bedtime as needed (sleep). Sleep    Yes Historical Provider, MD  predniSONE (DELTASONE) 1 MG tablet TAKE 4 TABLETS DAILY FOR DOSAGE OF 4 MG A DAY Patient taking differently: TAKE 5 MG BY MOUTH DAILY 02/16/16  Yes [name], MD  simvastatin (ZOCOR) 40 MG tablet TAKE 1 TABLET BY MOUTH EVERY DAY AT BEDTIME Patient taking differently: TAKE 0.5 TABLET BY MOUTH EVERY DAY AT BEDTIME 01/16/16  Yes [name], MD  triamcinolone ointment (KENALOG) 0.1 % APPLY TO AFFECTED AREA TWICE A DAY 12/08/15  Yes [name], MD  XARELTO 15 MG TABS tablet TAKE 1 TABLET BY MOUTH EVERY DAY Patient taking differently: TAKE 15 MG BY MOUTH DAILY 01/09/16  Yes  [name], MD  doxycycline (VIBRA-TABS) 100 MG tablet Take 1 tablet (100 mg total) by mouth 2 (two) times daily. Patient not taking: Reported on 05/20/2016 05/05/16   [name], MD  Ipratropium-Albuterol (COMBIVENT) 20-100 MCG/ACT AERS respimat Inhale 1 puff into the lungs every 6 (six) hours as needed for wheezing. Patient not taking: Reported on 05/16/2016 09/10/15   [name], MD  pantoprazole (PROTONIX) 40 MG tablet TAKE 1 TABLET (40 MG TOTAL) BY MOUTH DAILY. Patient not taking: Reported on 05/16/2016 04/01/15   Inda Castle, MD  tamsulosin [facility]) 0.4 MG CAPS capsule Take 1 capsule (0.4 mg total) by mouth daily after breakfast. Patient not taking: Reported on 05/16/2016 10/07/14   [name], MD  ursodiol (ACTIGALL) 250 MG tablet TAKE 1 TABLET BY MOUTH TWICE A DAY Patient not taking: Reported on 05/16/2016 05/06/16   [name], MD   BP 135/57 mmHg  Pulse 68  Temp(Src) 98 F (36.7 C) (Oral)  Resp 19  Ht 5\' 5"  (1.651 m)  Wt 120 lb (54.432 kg)  BMI 19.97 kg/m2  SpO2 94% Physical Exam  Constitutional: He is oriented to person, place, and time. He appears well-developed and well-nourished. No distress.  HENT:  Head: Normocephalic and atraumatic.  Right  Ear: External ear normal.  Left Ear: External ear normal.  Mouth/Throat: Oropharynx is clear and moist. No oropharyngeal exudate.  Eyes: EOM are normal. Pupils are equal, round, and reactive to light.  Neck: Normal range of motion. Neck supple.  Cardiovascular: Normal rate, regular rhythm, normal heart sounds and intact distal pulses.   No murmur heard. Pulmonary/Chest: Effort normal. No respiratory distress. He has no wheezes. He has no rales.  Abdominal: Soft. He exhibits no distension. There is tenderness (LLQ). A hernia is present. Hernia confirmed positive in the left inguinal area (protruding with mild to moderate tenderness).  Musculoskeletal: Normal range of motion. He exhibits no edema.  Neurological: He is alert and oriented to person, place, and time.  Skin: Skin is warm and dry. No rash noted. He is not diaphoretic.  Vitals reviewed.   ED Course  Procedures (including critical care time) Labs Review Labs Reviewed  CBC - Abnormal; Notable for the following:    RBC 3.05 (*)    Hemoglobin 9.4 (*)    HCT 28.2 (*)    All other components within normal limits  BASIC METABOLIC PANEL  I-STAT CG4 LACTIC ACID, ED    Imaging Review Dg Abd Acute W/chest  05/20/2016  CLINICAL DATA:  Left lower abdominal pain for 4-5 days EXAM: DG ABDOMEN ACUTE W/ 1V CHEST COMPARISON:  05/16/2016 FINDINGS: There is no evidence of dilated bowel loops or free intraperitoneal air. No radiopaque calculi or other significant radiographic abnormality is seen. Heart size and mediastinal contours are within normal limits. The lungs are hyperinflated likely secondary to COPD. IMPRESSION: Negative abdominal radiographs.  No acute cardiopulmonary disease. Electronically Signed   By: Kathreen Devoid   On: 05/20/2016 15:37   I have personally reviewed and evaluated these images and lab results as part of my medical decision-making.   EKG Interpretation None      MDM  Patient was seen and evaluated in stable  condition. Bilateral inguinal hernias reduced on initial examination. Laboratory studies unremarkable. Acute abdominal series unremarkable. On reevaluation patient had returned and recurrence of inguinal hernias with significant pain and tenderness especially on the left. Case was discussed with general surgery who came bedside to evaluate the  patient. At this time they recommended admission to medicine for medical clearance to help facilitate better definitive management of inguinal hernias.I agree the patient requires admission at this time for symptom control and continued treatment. Case was discussed with Dr. Wynetta Emery from [facility] who agrees with admission. Patient and wife updated on results and plan of care. Final diagnoses:  None    1. Recurrent bilateral inguinal hernias    Harvel Quale, MD 05/21/16 [phone]

## 2016-05-21 DIAGNOSIS — I480 Paroxysmal atrial fibrillation: Secondary | ICD-10-CM | POA: Diagnosis not present

## 2016-05-21 DIAGNOSIS — Z7901 Long term (current) use of anticoagulants: Secondary | ICD-10-CM

## 2016-05-21 DIAGNOSIS — D638 Anemia in other chronic diseases classified elsewhere: Secondary | ICD-10-CM | POA: Diagnosis not present

## 2016-05-21 DIAGNOSIS — R1032 Left lower quadrant pain: Secondary | ICD-10-CM

## 2016-05-21 DIAGNOSIS — I25118 Atherosclerotic heart disease of native coronary artery with other forms of angina pectoris: Secondary | ICD-10-CM | POA: Diagnosis not present

## 2016-05-21 DIAGNOSIS — K4021 Bilateral inguinal hernia, without obstruction or gangrene, recurrent: Secondary | ICD-10-CM | POA: Diagnosis not present

## 2016-05-21 DIAGNOSIS — Z01818 Encounter for other preprocedural examination: Secondary | ICD-10-CM

## 2016-05-21 DIAGNOSIS — I10 Essential (primary) hypertension: Secondary | ICD-10-CM

## 2016-05-21 DIAGNOSIS — K7460 Unspecified cirrhosis of liver: Secondary | ICD-10-CM

## 2016-05-21 LAB — BASIC METABOLIC PANEL
Anion gap: 6 (ref 5–15)
BUN: 55 mg/dL — AB (ref 6–20)
CALCIUM: 8.2 mg/dL — AB (ref 8.9–10.3)
CO2: 26 mmol/L (ref 22–32)
CREATININE: 2.35 mg/dL — AB (ref 0.61–1.24)
Chloride: 106 mmol/L (ref 101–111)
GFR calc non Af Amer: 23 mL/min — ABNORMAL LOW (ref >60)
GFR, EST AFRICAN AMERICAN: 27 mL/min — AB (ref >60)
GLUCOSE: 97 mg/dL (ref 65–99)
Potassium: 4.1 mmol/L (ref 3.5–5.1)
Sodium: 138 mmol/L (ref 135–145)

## 2016-05-21 LAB — CBC
HCT: 26.3 % — ABNORMAL LOW (ref 39.0–52.0)
Hemoglobin: 8.7 g/dL — ABNORMAL LOW (ref 13.0–17.0)
MCH: 30.6 pg (ref 26.0–34.0)
MCHC: 33.1 g/dL (ref 30.0–36.0)
MCV: 92.6 fL (ref 78.0–100.0)
PLATELETS: 154 10*3/uL (ref 150–400)
RBC: 2.84 MIL/uL — ABNORMAL LOW (ref 4.22–5.81)
RDW: 14.8 % (ref 11.5–15.5)
WBC: 6.1 10*3/uL (ref 4.0–10.5)

## 2016-05-21 LAB — GLUCOSE, CAPILLARY
GLUCOSE-CAPILLARY: 118 mg/dL — AB (ref 65–99)
GLUCOSE-CAPILLARY: 152 mg/dL — AB (ref 65–99)
GLUCOSE-CAPILLARY: 143 mg/dL — AB (ref 65–99)
Glucose-Capillary: 100 mg/dL — ABNORMAL HIGH (ref 65–99)

## 2016-05-21 MED ORDER — CYANOCOBALAMIN 1000 MCG/ML IJ SOLN
1000.0000 ug | Freq: Once | INTRAMUSCULAR | Status: AC
  Administered 2016-05-21: 1000 ug via INTRAMUSCULAR
  Filled 2016-05-21: qty 1

## 2016-05-21 NOTE — Progress Notes (Signed)
  Subjective: Had some pain and nausea ealier this am but feels better now  Objective: Vital signs in last 24 hours: Temp:  [97.7 F (36.5 C)-98.7 F (37.1 C)] 98.7 F (37.1 C) (07/22 0514) Pulse Rate:  [52-68] 52 (07/22 0514) Resp:  [14-19] 18 (07/22 0514) BP: (135-175)/(49-58) 175/57 mmHg (07/22 0638) SpO2:  [93 %-97 %] 96 % (07/22 0853) Weight:  [54.432 kg (120 lb)] 54.432 kg (120 lb) (07/21 1402) Last BM Date: 05/18/16  Intake/Output from previous day: 07/21 0701 - 07/22 0700 In: 653 [I.V.:653] Out: 500 [Urine:500] Intake/Output this shift: Total I/O In: -  Out: 125 [Urine:125]  Resp: clear to auscultation bilaterally Cardio: regular rate and rhythm GI: soft, nontender. right inguinal hernia reduces easily. left inguinal reduces but not as easily  Lab Results:   Recent Labs  05/20/16 1508 05/21/16 0550  WBC 8.1 6.1  HGB 9.4* 8.7*  HCT 28.2* 26.3*  PLT 171 154   BMET  Recent Labs  05/20/16 1508 05/21/16 0550  NA 139 138  K 4.1 4.1  CL 106 106  CO2 28 26  GLUCOSE 115* 97  BUN 55* 55*  CREATININE 1.75* 2.35*  CALCIUM 8.5* 8.2*   PT/INR No results for input(s): LABPROT, INR in the last 72 hours. ABG No results for input(s): PHART, HCO3 in the last 72 hours.  Invalid input(s): PCO2, PO2  Studies/Results: Dg Abd Acute W/chest  05/20/2016  CLINICAL DATA:  Left lower abdominal pain for 4-5 days EXAM: DG ABDOMEN ACUTE W/ 1V CHEST COMPARISON:  05/16/2016 FINDINGS: There is no evidence of dilated bowel loops or free intraperitoneal air. No radiopaque calculi or other significant radiographic abnormality is seen. Heart size and mediastinal contours are within normal limits. The lungs are hyperinflated likely secondary to COPD. IMPRESSION: Negative abdominal radiographs.  No acute cardiopulmonary disease. Electronically Signed   By: Kathreen Devoid   On: 05/20/2016 15:37    Anti-infectives: Anti-infectives    None      Assessment/Plan: s/p * No surgery  found * continue soft diet  Will plan for hernia repair early this week once he is cleared medically  LOS: 1 day    TOTH III,Karlton Maya S 05/21/2016

## 2016-05-21 NOTE — Consult Note (Signed)
CARDIOLOGY CONSULT NOTE  Patient ID: Cory Lawrence MRN: [id number] DOB/AGE: May 09, 1925 82 y.o.  Admit date: 05/20/2016 Primary Physician: [name], MD Referring Physician: Toth MD  Reason for Consultation: CAD, preop clearance  HPI: 82 yr old male with CAD with occluded RCA and intact left to right collateral filling. Followed by Dr. Acie Fredrickson, most recently in 09/2015. Last echocardiogram in 2013 showed LVEF 50% with inferolateral hypokinesis and mild aortic regurgitation. ECG this admission shows NSR with LAFB and nonspecific T wave abnormality. He has been managed medically with respect to CAD. Also has paroxysmal atrial fibrillation and is anticoagulated with Xarelto (currently on hold).  He was admitted with severe LLQ abdominal pain related to b/l inguinal hernias and has been evaluated by general surgery and is being scheduled for hernia repair pending clearance.  Denies chest pain. Has chronic exertional dyspnea related to COPD which has not gotten worse. Does not remember having to take SL nitroglycerin in the last several months.   Allergies  Allergen Reactions  . Ciprofloxacin Hcl Swelling  . Penicillins Hives    Has patient had a PCN reaction causing immediate rash, facial/tongue/throat swelling, SOB or lightheadedness with hypotension: YES Has patient had a PCN reaction causing severe rash involving mucus membranes or skin necrosis: NO Has patient had a PCN reaction that required hospitalization NO Has patient had a PCN reaction occurring within the last 10 years: NO If all of the above answers are "NO", then may proceed with Cephalosporin use.    Current Facility-Administered Medications  Medication Dose Route Frequency Provider Last Rate Last Dose  . acetaminophen (TYLENOL) tablet 650 mg  650 mg Oral Q6H PRN Clanford Marisa Hua, MD       Or  . acetaminophen (TYLENOL) suppository 650 mg  650 mg Rectal Q6H PRN Clanford Marisa Hua, MD      .  ALPRAZolam Duanne Moron) tablet 0.25 mg  0.25 mg Oral QHS PRN Clanford Marisa Hua, MD   0.25 mg at 05/21/16 0008  . antiseptic oral rinse (CPC / CETYLPYRIDINIUM CHLORIDE 0.05%) solution 7 mL  7 mL Mouth Rinse BID Clanford L Johnson, MD   7 mL at 05/21/16 1058  . bisacodyl (DULCOLAX) suppository 10 mg  10 mg Rectal Daily PRN Clanford Marisa Hua, MD      . cyanocobalamin ((VITAMIN B-12)) injection 1,000 mcg  1,000 mcg Intramuscular Once Clanford Marisa Hua, MD      . digoxin (LANOXIN) tablet 125 mcg  125 mcg Oral Daily Clanford Marisa Hua, MD   125 mcg at 05/21/16 1058  . donepezil (ARICEPT) tablet 5 mg  5 mg Oral QHS Clanford Marisa Hua, MD   5 mg at 05/20/16 2238  . doxazosin (CARDURA) tablet 2 mg  2 mg Oral QHS Clanford Marisa Hua, MD   2 mg at 05/20/16 2238  . feeding supplement (ENSURE ENLIVE) (ENSURE ENLIVE) liquid 237 mL  237 mL Oral BID BM Clanford L Johnson, MD   237 mL at 05/21/16 1058  . finasteride (PROSCAR) tablet 5 mg  5 mg Oral Daily Clanford Marisa Hua, MD   5 mg at 05/21/16 1058  . fluticasone furoate-vilanterol (BREO ELLIPTA) 200-25 MCG/INH 1 puff  1 puff Inhalation Daily Clanford Marisa Hua, MD   1 puff at 05/21/16 0853  . hydrALAZINE (APRESOLINE) injection 5 mg  5 mg Intravenous Q4H PRN Clanford L Johnson, MD      . HYDROmorphone (DILAUDID) injection 0.5 mg  0.5 mg Intravenous Q3H PRN  Clanford Marisa Hua, MD   0.5 mg at 05/21/16 [id number]  . insulin aspart (novoLOG) injection 0-9 Units  0-9 Units Subcutaneous TID WC Clanford Marisa Hua, MD   2 Units at 05/21/16 1258  . ipratropium-albuterol (DUONEB) 0.5-2.5 (3) MG/3ML nebulizer solution 3 mL  3 mL Nebulization Q6H PRN Clanford L Johnson, MD      . isosorbide mononitrate (IMDUR) 24 hr tablet 30 mg  30 mg Oral Daily Clanford Marisa Hua, MD   30 mg at 05/21/16 1058  . ondansetron (ZOFRAN) injection 4 mg  4 mg Intravenous Q6H PRN Harvel Quale, MD   4 mg at 05/20/16 1622  . pantoprazole (PROTONIX) EC tablet 40 mg  40 mg Oral Daily Clanford Marisa Hua, MD    40 mg at 05/21/16 1058  . polyethylene glycol (MIRALAX / GLYCOLAX) packet 17 g  17 g Oral Daily Clanford L Johnson, MD      . senna-docusate (Senokot-S) tablet 1 tablet  1 tablet Oral BID Clanford Marisa Hua, MD   1 tablet at 05/21/16 1058  . simvastatin (ZOCOR) tablet 20 mg  20 mg Oral QHS Clanford Marisa Hua, MD   20 mg at 05/20/16 2238    Past Medical History  Diagnosis Date  . Dizziness   . Diaphoresis   . Palpitations   . COPD (chronic obstructive pulmonary disease) ([place])   . Hypertension   . Hyperlipidemia   . GERD (gastroesophageal reflux disease)   . BPH (benign prostatic hypertrophy)   . Personal history of colonic polyps 07/08/2002,06/2011    tubular adenoma, hyperplastic  . Internal hemorrhoids without mention of complication   . Diverticulosis of colon (without mention of hemorrhage)   . Biliary cirrhosis ([place])   . Unspecified gastritis and gastroduodenitis without mention of hemorrhage   . NASH (nonalcoholic steatohepatitis)   . Anxiety   . Arthritis   . Ulcer     Gastric/bleeding  . Diabetes mellitus type 2, diet-controlled ([place])     managed with diet  . Vitamin B12 deficiency   . PAF (paroxysmal atrial fibrillation) ([facility])     on Xarelto  . CAD (coronary artery disease)     prior abnormal Myoview; subsequent cath; managed medically  . Heart attack ([place])   . Chronic anticoagulation     Past Surgical History  Procedure Laterality Date  . Cataract extraction      bilateral  . Eye surgery      left eye x multiple  . Cardiac catheterization  March 2013    Managed medically    Social History   Social History  . Marital Status: Married    Spouse Name: N/A  . Number of Children: 2  . Years of Education: N/A   Occupational History  . retired    Social History Main Topics  . Smoking status: Former Research scientist (life sciences)  . Smokeless tobacco: Former Systems developer    Quit date: 05/31/1996  . Alcohol Use: No  . Drug Use: No  . Sexual Activity: No   Other Topics Concern  . Not  on file   Social History Narrative     No family history of premature CAD in 1st degree relatives.  Prior to Admission medications   Medication Sig Start Date End Date Taking? Authorizing Provider  ALPRAZolam (XANAX) 0.5 MG tablet TAKE 1/2 TABLET BY MOUTH AT BEDTIME MAY TAKE ADDITIONAL 1/2TABLET BY MOUTH AS NEEDED IF CANNOT SLEEP Patient taking differently: TAKE 0.25 MG BY MOUTH AT BEDTIME MAY TAKE ADDITIONAL 0.25 MG  BY MOUTH AS NEEDED IF CANNOT SLEEP 02/02/16  Yes [name], MD  cyanocobalamin (,VITAMIN B-12,) 1000 MCG/ML injection INJECT 1 ML (1,000 MCG TOTAL) INTO THE MUSCLE EVERY 30 (THIRTY) DAYS. 02/25/14  Yes Inda Castle, MD  diclofenac sodium (VOLTAREN) 1 % GEL APPLY 2 GRAMS TOPICALLY 3 TIMES DAILY Patient taking differently: APPLY 2 GRAMS TOPICALLY 3 TIMES DAILY PRN FOR PAIN 12/08/15  Yes [name], MD  digoxin (LANOXIN) 0.125 MG tablet TAKE 1 TABLET BY MOUTH DAILY Patient taking differently: TAKE 0.125 MG BY MOUTH DAILY 03/19/16  Yes Ezekiel Slocumb, PA-C  donepezil (ARICEPT) 5 MG tablet Take 1 tablet (5 mg total) by mouth at bedtime. 05/05/16  Yes [name], MD  doxazosin (CARDURA) 4 MG tablet Take 1 tablet (4 mg total) by mouth at bedtime. Patient taking differently: Take 2 mg by mouth at bedtime.  12/10/15  Yes [name], MD  Fe-Succ Ac-C-Thre Ac-B12-FA (FERREX 150 FORTE PLUS) 50-100 MG CAPS Take 1 capsule by mouth daily. 11/19/12  Yes Sable Feil, MD  finasteride (PROSCAR) 5 MG tablet Take 1 tablet (5 mg total) by mouth daily. 10/07/14  Yes [name], MD  Fluticasone-Salmeterol (ADVAIR DISKUS) 250-50 MCG/DOSE AEPB Inhale 1 puff into the lungs as needed. Shortness of breath. Patient taking differently: Inhale 1 puff into the lungs at bedtime. Shortness of breath. 09/10/15  Yes [name], MD  ipratropium-albuterol (DUONEB) 0.5-2.5 (3) MG/3ML SOLN Take 3 mLs by nebulization every 6 (six) hours as needed. Patient taking differently: Take 3 mLs by nebulization  every 6 (six) hours as needed (sob and wheezing).  01/07/16  Yes [name], MD  isosorbide mononitrate (IMDUR) 30 MG 24 hr tablet TAKE 1 TABLET (30 MG TOTAL) BY MOUTH DAILY. 01/09/16  Yes [name], MD  Melatonin 3 MG CAPS Take 1.5 mg by mouth at bedtime as needed (sleep). Sleep    Yes Historical Provider, MD  predniSONE (DELTASONE) 1 MG tablet TAKE 4 TABLETS DAILY FOR DOSAGE OF 4 MG A DAY Patient taking differently: TAKE 5 MG BY MOUTH DAILY 02/16/16  Yes [name], MD  simvastatin (ZOCOR) 40 MG tablet TAKE 1 TABLET BY MOUTH EVERY DAY AT BEDTIME Patient taking differently: TAKE 0.5 TABLET BY MOUTH EVERY DAY AT BEDTIME 01/16/16  Yes [name], MD  triamcinolone ointment (KENALOG) 0.1 % APPLY TO AFFECTED AREA TWICE A DAY 12/08/15  Yes [name], MD  XARELTO 15 MG TABS tablet TAKE 1 TABLET BY MOUTH EVERY DAY Patient taking differently: TAKE 15 MG BY MOUTH DAILY 01/09/16  Yes [name], MD  doxycycline (VIBRA-TABS) 100 MG tablet Take 1 tablet (100 mg total) by mouth 2 (two) times daily. Patient not taking: Reported on 05/20/2016 05/05/16   [name], MD  Ipratropium-Albuterol (COMBIVENT) 20-100 MCG/ACT AERS respimat Inhale 1 puff into the lungs every 6 (six) hours as needed for wheezing. Patient not taking: Reported on 05/16/2016 09/10/15   [name], MD  pantoprazole (PROTONIX) 40 MG tablet TAKE 1 TABLET (40 MG TOTAL) BY MOUTH DAILY. Patient not taking: Reported on 05/16/2016 04/01/15   Inda Castle, MD  tamsulosin [facility]) 0.4 MG CAPS capsule Take 1 capsule (0.4 mg total) by mouth daily after breakfast. Patient not taking: Reported on 05/16/2016 10/07/14   [name], MD  ursodiol (ACTIGALL) 250 MG tablet TAKE 1 TABLET BY MOUTH TWICE A DAY Patient not taking: Reported on 05/16/2016 05/06/16  [name], MD     Review of systems complete and found to be negative unless listed above in HPI     Physical exam Blood pressure 175/57, pulse 52, temperature 98.7 F (37.1  C), temperature source Oral, resp. rate 18, height 5\' 5"  (1.651 m), weight 120 lb (54.432 kg), SpO2 96 %. General: NAD Neck: No JVD, no thyromegaly or thyroid nodule.  Lungs: Clear to auscultation bilaterally with normal respiratory effort. CV: Nondisplaced PMI. Regular rate and rhythm, normal S1/S2, no S3/S4, no murmur.  No peripheral edema.   Abdomen: Soft, no distention.  Skin: Intact without lesions or rashes.  Neurologic: Alert.  Psych: Normal affect.  ECG: Most recent ECG reviewed.  Labs:   Lab Results  Component Value Date   WBC 6.1 05/21/2016   HGB 8.7* 05/21/2016   HCT 26.3* 05/21/2016   MCV 92.6 05/21/2016   PLT 154 05/21/2016    Recent Labs Lab 05/16/16 1454  05/21/16 0550  NA 136  < > 138  K 4.1  < > 4.1  CL 105  < > 106  CO2 24  < > 26  BUN 47*  < > 55*  CREATININE 1.90*  < > 2.35*  CALCIUM 8.5*  < > 8.2*  PROT 7.0  --   --   BILITOT 0.9  --   --   ALKPHOS 73  --   --   ALT 17  --   --   AST 23  --   --   GLUCOSE 98  < > 97  < > = values in this interval not displayed. Lab Results  Component Value Date   CKTOTAL 39 07/10/2014   CKMB 2.4 02/05/2012   TROPONINI <0.03 12/31/2015    Lab Results  Component Value Date   CHOL 113 11/12/2013   CHOL 105 10/16/2012   CHOL 117 02/05/2012   Lab Results  Component Value Date   HDL 41 11/12/2013   HDL 37* 10/16/2012   HDL 45 02/05/2012   Lab Results  Component Value Date   LDLCALC 51 11/12/2013   LDLCALC 34 10/16/2012   LDLCALC 60 02/05/2012   Lab Results  Component Value Date   TRIG 104 11/12/2013   TRIG 168* 10/16/2012   TRIG 60 02/05/2012   Lab Results  Component Value Date   CHOLHDL 2.8 11/12/2013   CHOLHDL 2.8 10/16/2012   CHOLHDL 2.6 02/05/2012   No results found for: LDLDIRECT       Studies: Dg Abd Acute W/chest  05/20/2016  CLINICAL DATA:  Left lower abdominal pain for 4-5 days EXAM: DG ABDOMEN ACUTE W/ 1V CHEST COMPARISON:  05/16/2016 FINDINGS: There is no evidence of dilated  bowel loops or free intraperitoneal air. No radiopaque calculi or other significant radiographic abnormality is seen. Heart size and mediastinal contours are within normal limits. The lungs are hyperinflated likely secondary to COPD. IMPRESSION: Negative abdominal radiographs.  No acute cardiopulmonary disease. Electronically Signed   By: Kathreen Devoid   On: 05/20/2016 15:37    ASSESSMENT AND PLAN:  1. Preoperative risk stratification: Given his multiple comorbidities, he is at an increased risk for a major adverse cardiac event but not prohibitive. Would aim to control BP which is being driven by abdominal pain. I will obtain an echocardiogram to assess cardiac structure and function. LVEF 50% in 2013 with inferolateral hypokinesis.  2. PAF: Currently in sinus rhythm. Xarelto on hold for pending surgery.  3. Essential HTN: Elevated and being driven  by abdominal pain. Has IV hydralazine prn ordered.  4. CAD: Asymptomatic. Has an occluded RCA and intact left to right collateral filling. I will obtain an echocardiogram to assess cardiac structure and function. LVEF 50% in 2013 with inferolateral hypokinesis. Continue nitrates and statin.   Signed: Kate Sable, M.D., F.A.C.C.  05/21/2016, 2:20 PM

## 2016-05-21 NOTE — Progress Notes (Signed)
PROGRESS NOTE    Cory Lawrence  [id number]  DOB: [birth year]  DOA: 05/20/2016 PCP: [name], MD Outpatient Specialists:   Hospital course: HPI: Cory Lawrence is a 82 y.o. male with a complex PMH detailed below presented to the ED for the second time in the last several days with severe recurrent LLQ abdominal pain related to bilateral inguinal hernias complicated by chronic constipation and generalized weakness from poor oral intake. The patient and his wife report that 5-6 days ago the patient picked up something at home and afterwards had severe pain mostly on the left side lower quadrant of abdomen. He reports that his pain is in his inguinal region and that he noted that his hernia had protruded and felt hard like a baseball. He was seen 4 days ago in the emergency department for this issue and per notes had his hernia successfully reduced and was sent Home with outpatient surgical follow-up. The patient and his wife report that it came right back out and the patient has been in severe pain over the last few days at home. He has had very little appetite and has not been able to eat much. He has only had 1 bowel movement over this time. His wife said she had to give him multiple laxatives in order for him to have a bowel movement. He says the pain can be extremely severe and at this morning it was extremely severe. Many years ago he was diagnosed with the hernias and was offered a repair but his symptoms improved on their own and so he refused it.  He is chronically anticoagulated with Xarelto for chronic atrial fibrillation. He has CAD and a completely occluded right coronary managed medically by cardiology. He denies having chest pain. He has COPD and chronic shortness of breath. He also has chronic kidney disease and chronic anemia which has been stable. He was evaluated by general surgery in ED and they requested inpatient admission for medical clearance and  surgical team will consider inguinal hernia repair.   Assessment & Plan:   LLQ abdominal pain - secondary to recurrent left inguinal hernia, reduced in ED but has had multiple recurrences, also has chronic constipation. Admit for symptom management, pain control, Gen surgery consult.  Bilateral inguinal hernia without obstruction or gangrene - as above, will maximize medical therapy in anticipation for hernia repair next week, I asked cardiology for surgery clearance. He is high risk for surgery given his many medical co-morbidities and he accepts and understands this risk but wants to proceed with having hernia repair if possible, likely will have in next 2-3 days.    CAD (coronary artery disease) - patient has an occluded right coronary that is being medically managed, consulted [facility] cardiology for surgical clearance. He is followed by Grayland Dai, MD. Check EKG. His is completely asymptomatic at this time.    Cirrhosis of liver not due to alcohol - secondary to steatosis, liver enzymes stable.    COPD with chronic bronchitis - continue regular use of home respiratory medications and supplemental oxygen as needed.   Chronic Atrial Fibrillation - holding Xarelto for now in anticipation for surgery next week.   Weight loss - suspect secondary to poor oral intake with abdominal pain related to hernias.   Chronic anticoagulation - would hold Xarelto for now as noted above.   Hypertension - fairly well controlled on current medications, will continue them and follow.   Diabetes mellitus type 2, diet-controlled - monitor blood glucose  while in hospital for stress hyperglycemia, sliding scale coverage ordered, A1c pending.    Constipation - scheduled laxatives ordered. He had a bowel movement yesterday after taking miralax for 5 days with mineral oil.   Anemia, chronic disease - stable hemoglobin, will follow.   CKD (chronic kidney disease) stage 3, GFR 30-59 ml/min -  avoid nephrotoxins, renally dose medications. Gentle IVF hydration as he is mildly dehydrated. Recheck BMP in AM.   History of gastroesophageal reflux (GERD) - protonix ordered for GI protection.    Generalized weakness - PT evaluation requested. HHPT recommended.   DVT Prophylaxis: mechanical Code Status: FULL Family Communication: at bedside  Disposition Plan: TBD will need HHPT  Consultants:  General surgery  Cardiology [facility]  Subjective: Pt reports that pain is controlled.  He did not have a BM yet.  He ambulated some with PT in the hallway. No chest pain and no SOB.   Objective: Filed Vitals:   05/20/16 2125 05/21/16 0514 05/21/16 0638 05/21/16 0853  BP: 142/57 161/57 175/57   Pulse: 59 52    Temp: 97.7 F (36.5 C) 98.7 F (37.1 C)    TempSrc: Oral Oral    Resp: 19 18    Height:      Weight:      SpO2: 97% 97%  96%    Intake/Output Summary (Last 24 hours) at 05/21/16 1343 Last data filed at 05/21/16 0800  Gross per 24 hour  Intake    653 ml  Output    625 ml  Net     28 ml   Filed Weights   05/20/16 1402  Weight: 54.432 kg (120 lb)    Exam:  Constitutional: He is oriented to person, place, and time. He appears well-developed and well-nourished. No distress.  HENT: Head: Normocephalic and atraumatic.  Mouth/Throat: Oropharynx is clear and moist. No oropharyngeal exudate.  Eyes: EOM are normal. Pupils are equal, round, and reactive to light.  Neck: Normal range of motion. Neck supple.  Cardiovascular: Normal rate, regular rhythm, normal heart sounds and intact distal pulses.No murmur heard. Pulmonary/Chest: Effort normal. No respiratory distress. He has no wheezes. He has no rales.  Abdominal: Soft. He exhibits no distension. There is tenderness (LLQ). A hernia is present. Easily reducible Hernia confirmed positive in the left inguinal area (protruding with mild to moderate tenderness).  Musculoskeletal: Normal range of motion. He exhibits no  edema.  Neurological: He is alert and oriented to person, place, and time.   Data Reviewed: Basic Metabolic Panel:  Recent Labs Lab 05/16/16 1454 05/20/16 1508 05/21/16 0550  NA 136 139 138  K 4.1 4.1 4.1  CL 105 106 106  CO2 24 28 26   GLUCOSE 98 115* 97  BUN 47* 55* 55*  CREATININE 1.90* 1.75* 2.35*  CALCIUM 8.5* 8.5* 8.2*   Liver Function Tests:  Recent Labs Lab 05/16/16 1454  AST 23  ALT 17  ALKPHOS 73  BILITOT 0.9  PROT 7.0  ALBUMIN 3.5    Recent Labs Lab 05/16/16 1454  LIPASE 23   No results for input(s): AMMONIA in the last 168 hours. CBC:  Recent Labs Lab 05/16/16 1454 05/20/16 1508 05/21/16 0550  WBC 9.6 8.1 6.1  HGB 9.9* 9.4* 8.7*  HCT 30.6* 28.2* 26.3*  MCV 93.3 92.5 92.6  PLT 211 171 154   Cardiac Enzymes: No results for input(s): CKTOTAL, CKMB, CKMBINDEX, TROPONINI in the last 168 hours. BNP (last 3 results) No results for input(s): PROBNP in the last 8760  hours. CBG:  Recent Labs Lab 05/20/16 1834 05/20/16 2138 05/21/16 0739 05/21/16 1334  GLUCAP 113* 151* 118* 152*    No results found for this or any previous visit (from the past 240 hour(s)).   Studies: Dg Abd Acute W/chest  05/20/2016  CLINICAL DATA:  Left lower abdominal pain for 4-5 days EXAM: DG ABDOMEN ACUTE W/ 1V CHEST COMPARISON:  05/16/2016 FINDINGS: There is no evidence of dilated bowel loops or free intraperitoneal air. No radiopaque calculi or other significant radiographic abnormality is seen. Heart size and mediastinal contours are within normal limits. The lungs are hyperinflated likely secondary to COPD. IMPRESSION: Negative abdominal radiographs.  No acute cardiopulmonary disease. Electronically Signed   By: Kathreen Devoid   On: 05/20/2016 15:37     Scheduled Meds: . antiseptic oral rinse  7 mL Mouth Rinse BID  . digoxin  125 mcg Oral Daily  . donepezil  5 mg Oral QHS  . doxazosin  2 mg Oral QHS  . feeding supplement (ENSURE ENLIVE)  237 mL Oral BID BM  .  finasteride  5 mg Oral Daily  . fluticasone furoate-vilanterol  1 puff Inhalation Daily  . insulin aspart  0-9 Units Subcutaneous TID WC  . isosorbide mononitrate  30 mg Oral Daily  . pantoprazole  40 mg Oral Daily  . polyethylene glycol  17 g Oral Daily  . senna-docusate  1 tablet Oral BID  . simvastatin  20 mg Oral QHS   Continuous Infusions:   Principal Problem:   LLQ abdominal pain Active Problems:   CAD (coronary artery disease)   Cirrhosis of liver not due to alcohol    COPD with bronchitis   Atrial Fibrillation   Weight loss   Inguinal hernia   Bilateral inguinal hernia without obstruction or gangrene   Chronic anticoagulation   Hypertension   NASH (nonalcoholic steatohepatitis)   Diabetes mellitus type 2, diet-controlled ([facility])   Constipation   Anemia, chronic disease   CKD (chronic kidney disease) stage 3, GFR 30-59 ml/min   History of gastroesophageal reflux (GERD)   Generalized weakness   Time spent:   Irwin Brakeman, MD, FAAFP [facility] Pager [phone]  If 7PM-7AM, please contact night-coverage [email or web address] Password [email or web address] 05/21/2016, 1:43 PM    LOS: 1 day

## 2016-05-21 NOTE — Evaluation (Signed)
Physical Therapy Evaluation Patient Details Name: Erinn Moonen MRN: [id number] DOB: Oct 29, 1925 Today's Date: 05/21/2016   History of Present Illness  82 yo male admitted with LLQ pain due to hernia. hx of COPD, HTN, DM, CAD, PAF, arthritis.   Clinical Impression  On eval, pt required Min assist for mobility. He walked ~40 feet while holding on to IV pole. Pt denied pain during session. Pt states plan is for surgery once this is able to be scheduled. Will follow and progress activity as tolerated. Will update d/c plan if needed after procedure.     Follow Up Recommendations Home health PT;Supervision/Assistance - 24 hour (depending on progress after surgicial procedure)    Equipment Recommendations  None recommended by PT    Recommendations for Other Services       Precautions / Restrictions Precautions Precautions: Fall Restrictions Weight Bearing Restrictions: No      Mobility  Bed Mobility               General bed mobility comments: oob in recliner  Transfers Overall transfer level: Needs assistance   Transfers: Sit to/from Stand Sit to Stand: Min assist         General transfer comment: small amount of assist to steady.   Ambulation/Gait Ambulation/Gait assistance: Min assist Ambulation Distance (Feet): 40 Feet Assistive device:  (IV pole) Gait Pattern/deviations: Step-through pattern;Decreased stride length     General Gait Details: intermittent assist to stabilize. Pt tolerated distance well.   Stairs            Wheelchair Mobility    Modified Rankin (Stroke Patients Only)       Balance                                             Pertinent Vitals/Pain Pain Assessment: No/denies pain    Home Living Family/patient expects to be discharged to:: Private residence Living Arrangements: Spouse/significant other Available Help at Discharge: Family Type of Home: House Home Access: Stairs to enter Entrance  Stairs-Rails: Psychiatric nurse of Steps: [place]: One level Home Equipment: Environmental consultant - 2 wheels;Cane - single point      Prior Function Level of Independence: Independent with assistive device(s)         Comments: uses cane PRN     Hand Dominance        Extremity/Trunk Assessment   Upper Extremity Assessment: Overall WFL for tasks assessed           Lower Extremity Assessment: Generalized weakness      Cervical / Trunk Assessment: Kyphotic  Communication   Communication: HOH  Cognition Arousal/Alertness: Awake/alert Behavior During Therapy: WFL for tasks assessed/performed Overall Cognitive Status: Within Functional Limits for tasks assessed                      General Comments      Exercises        Assessment/Plan    PT Assessment    PT Diagnosis Difficulty walking;Generalized weakness;Acute pain   PT Problem List Decreased strength;Decreased activity tolerance;Decreased balance;Decreased mobility;Pain  PT Treatment Interventions DME instruction;Gait training;Functional mobility training;Therapeutic activities;Patient/family education;Balance training;Therapeutic exercise   PT Goals (Current goals can be found in the Care Plan section) Acute Rehab PT Goals Patient Stated Goal: none stated PT Goal Formulation: With patient Time For Goal Achievement: 06/04/16 Potential to Achieve  Goals: Good    Frequency Min 3X/week   Barriers to discharge        Co-evaluation               End of Session Equipment Utilized During Treatment: Gait belt Activity Tolerance: Patient tolerated treatment well Patient left: in chair;with call bell/phone within reach      Functional Assessment Tool Used: clinical judgement Functional Limitation: Mobility: Walking and moving around Mobility: Walking and Moving Around Current Status [id number]): At least 1 percent but less than 20 percent impaired, limited or restricted Mobility:  Walking and Moving Around Goal Status [phone]): At least 1 percent but less than 20 percent impaired, limited or restricted    Time: 1040-1050 PT Time Calculation (min) (ACUTE ONLY): 10 min   Charges:   PT Evaluation $PT Eval Low Complexity: 1 Procedure     PT G Codes:   PT G-Codes **NOT FOR INPATIENT CLASS** Functional Assessment Tool Used: clinical judgement Functional Limitation: Mobility: Walking and moving around Mobility: Walking and Moving Around Current Status [id number]): At least 1 percent but less than 20 percent impaired, limited or restricted Mobility: Walking and Moving Around Goal Status [phone]): At least 1 percent but less than 20 percent impaired, limited or restricted    Weston Anna, MPT Pager: [phone]

## 2016-05-22 ENCOUNTER — Observation Stay (HOSPITAL_BASED_OUTPATIENT_CLINIC_OR_DEPARTMENT_OTHER): Payer: Medicare Other

## 2016-05-22 DIAGNOSIS — R1032 Left lower quadrant pain: Secondary | ICD-10-CM | POA: Diagnosis not present

## 2016-05-22 DIAGNOSIS — Z8719 Personal history of other diseases of the digestive system: Secondary | ICD-10-CM

## 2016-05-22 DIAGNOSIS — R410 Disorientation, unspecified: Secondary | ICD-10-CM

## 2016-05-22 DIAGNOSIS — I25118 Atherosclerotic heart disease of native coronary artery with other forms of angina pectoris: Secondary | ICD-10-CM | POA: Diagnosis not present

## 2016-05-22 DIAGNOSIS — Z7901 Long term (current) use of anticoagulants: Secondary | ICD-10-CM | POA: Diagnosis not present

## 2016-05-22 DIAGNOSIS — E440 Moderate protein-calorie malnutrition: Secondary | ICD-10-CM | POA: Insufficient documentation

## 2016-05-22 DIAGNOSIS — I2510 Atherosclerotic heart disease of native coronary artery without angina pectoris: Secondary | ICD-10-CM

## 2016-05-22 DIAGNOSIS — J449 Chronic obstructive pulmonary disease, unspecified: Secondary | ICD-10-CM

## 2016-05-22 DIAGNOSIS — I480 Paroxysmal atrial fibrillation: Secondary | ICD-10-CM | POA: Diagnosis not present

## 2016-05-22 LAB — ECHOCARDIOGRAM COMPLETE
Height: 65 in
Weight: 1920 oz

## 2016-05-22 LAB — GLUCOSE, CAPILLARY
GLUCOSE-CAPILLARY: 102 mg/dL — AB (ref 65–99)
GLUCOSE-CAPILLARY: 104 mg/dL — AB (ref 65–99)
GLUCOSE-CAPILLARY: 112 mg/dL — AB (ref 65–99)
Glucose-Capillary: 106 mg/dL — ABNORMAL HIGH (ref 65–99)

## 2016-05-22 MED ORDER — SODIUM CHLORIDE 0.9 % IV SOLN: INTRAVENOUS | Status: AC

## 2016-05-22 MED ORDER — HYDRALAZINE HCL 20 MG/ML IJ SOLN: 10.0000 mg | INTRAMUSCULAR | Status: DC | PRN

## 2016-05-22 MED ORDER — HYDROMORPHONE HCL 1 MG/ML IJ SOLN
0.2500 mg | INTRAMUSCULAR | Status: DC | PRN
  Administered 2016-05-24 – 2016-05-25 (×3): 0.25 mg via INTRAVENOUS
  Filled 2016-05-22 (×3): qty 1

## 2016-05-22 MED ORDER — HALOPERIDOL LACTATE 5 MG/ML IJ SOLN
1.0000 mg | Freq: Once | INTRAMUSCULAR | Status: AC
Start: 2016-05-22 — End: 2016-05-22
  Administered 2016-05-22: 1 mg via INTRAVENOUS
  Filled 2016-05-22: qty 1

## 2016-05-22 MED ORDER — QUETIAPINE FUMARATE 25 MG PO TABS: 25.0000 mg | ORAL_TABLET | Freq: Every day | ORAL | Status: DC

## 2016-05-22 MED ORDER — QUETIAPINE FUMARATE 25 MG PO TABS: 25.0000 mg | ORAL_TABLET | Freq: Every evening | ORAL | Status: DC | PRN

## 2016-05-22 MED ORDER — LORAZEPAM 2 MG/ML IJ SOLN
0.5000 mg | Freq: Once | INTRAMUSCULAR | Status: AC
  Administered 2016-05-23: 0.5 mg via INTRAVENOUS
  Filled 2016-05-22: qty 1

## 2016-05-22 NOTE — Progress Notes (Signed)
05/22/2016 3:18 PM   Progress Note  I had a discussion with patient's wife when she arrived.  She says that patient's dementia has been getting worse and he has been a lot more combative recently at home especially at night.  He becomes confused and very disoriented at night she says.  She says that she has his living will papers at home but did not bring them but she says that patient is full DNR and more comfort care if he decompensates.  I will update her wishes in patient record and orders.  If it is looking like patient is not going to be stable enough to undergo surgery then would plan on consulting palliative medicine team to discuss comfort care.    Murvin Natal, MD

## 2016-05-22 NOTE — Progress Notes (Signed)
  Subjective: Confused today. Not able to communicate as well. Doesn't know where he is. mumbles  Objective: Vital signs in last 24 hours: Temp:  [97.7 F (36.5 C)-98.8 F (37.1 C)] 97.7 F (36.5 C) (07/22 2044) Pulse Rate:  [50-57] 50 (07/22 2044) Resp:  [16-18] 18 (07/22 2044) BP: (154-175)/(50-53) 175/53 (07/22 2044) SpO2:  [97 %-100 %] 100 % (07/22 2044) Last BM Date: 05/18/16  Intake/Output from previous day: 07/22 0701 - 07/23 0700 In: -  Out: 125 [Urine:125] Intake/Output this shift: No intake/output data recorded.  Resp: clear to auscultation bilaterally Cardio: regular rate and rhythm GI: soft, nontender. hernias appear reduced  Lab Results:   Recent Labs  05/20/16 1508 05/21/16 0550  WBC 8.1 6.1  HGB 9.4* 8.7*  HCT 28.2* 26.3*  PLT 171 154   BMET  Recent Labs  05/20/16 1508 05/21/16 0550  NA 139 138  K 4.1 4.1  CL 106 106  CO2 28 26  GLUCOSE 115* 97  BUN 55* 55*  CREATININE 1.75* 2.35*  CALCIUM 8.5* 8.2*   PT/INR No results for input(s): LABPROT, INR in the last 72 hours. ABG No results for input(s): PHART, HCO3 in the last 72 hours.  Invalid input(s): PCO2, PO2  Studies/Results: Dg Abd Acute W/chest  Result Date: 05/20/2016 CLINICAL DATA:  Left lower abdominal pain for 4-5 days EXAM: DG ABDOMEN ACUTE W/ 1V CHEST COMPARISON:  05/16/2016 FINDINGS: There is no evidence of dilated bowel loops or free intraperitoneal air. No radiopaque calculi or other significant radiographic abnormality is seen. Heart size and mediastinal contours are within normal limits. The lungs are hyperinflated likely secondary to COPD. IMPRESSION: Negative abdominal radiographs.  No acute cardiopulmonary disease. Electronically Signed   By: Kathreen Devoid   On: 05/20/2016 15:37    Anti-infectives: Anti-infectives    None      Assessment/Plan: s/p * No surgery found * Holding Xarelto for surgery  Cardiology getting echo New confusion per medicine Will plan for  surgery if cleared and medically stable  LOS: 1 day    TOTH III,PAUL S 05/22/2016

## 2016-05-22 NOTE — Progress Notes (Signed)
PROGRESS NOTE    Cory Lawrence  [id number]  DOB: 09/02/1925  DOA: 05/20/2016 PCP: [name], MD Outpatient Specialists:   Hospital course: HPI: Cory Lawrence is a 82 y.o. male with a complex PMH detailed below presented to the ED for the second time in the last several days with severe recurrent LLQ abdominal pain related to bilateral inguinal hernias complicated by chronic constipation and generalized weakness from poor oral intake. The patient and his wife report that 5-6 days ago the patient picked up something at home and afterwards had severe pain mostly on the left side lower quadrant of abdomen. He reports that his pain is in his inguinal region and that he noted that his hernia had protruded and felt hard like a baseball. He was seen 4 days ago in the emergency department for this issue and per notes had his hernia successfully reduced and was sent Home with outpatient surgical follow-up. The patient and his wife report that it came right back out and the patient has been in severe pain over the last few days at home. He has had very little appetite and has not been able to eat much. He has only had 1 bowel movement over this time. His wife said she had to give him multiple laxatives in order for him to have a bowel movement. He says the pain can be extremely severe and at this morning it was extremely severe. Many years ago he was diagnosed with the hernias and was offered a repair but his symptoms improved on their own and so he refused it.  He is chronically anticoagulated with Xarelto for chronic atrial fibrillation. He has CAD and a completely occluded right coronary managed medically by cardiology. He denies having chest pain. He has COPD and chronic shortness of breath. He also has chronic kidney disease and chronic anemia which has been stable. He was evaluated by general surgery in ED and they requested inpatient admission for medical clearance and  surgical team will consider inguinal hernia repair.   Assessment & Plan:   LLQ abdominal pain - secondary to recurrent left inguinal hernia, reduced in ED but has had multiple recurrences, also has chronic constipation. Admitted for symptom management, pain control, Gen surgery consult.  Bilateral inguinal hernia without obstruction or gangrene - as above, will maximize medical therapy in anticipation for hernia repair next week, I asked cardiology for surgery clearance. He is high risk for surgery given his frailty and many medical co-morbidities and he accepts and understands this risk but wants to proceed with having hernia repair if possible, likely when medically stabilized and cleared by cardiology.    CAD (coronary artery disease) - patient has an occluded right coronary that is being medically managed, consulted [facility] cardiology for surgical clearance. He is followed by Cory Tayvin, MD. Check EKG. His is completely asymptomatic at this time.    Acute Delirium - haldol 1 mg IV ordered x 1, start seroquel 25 mg QHS. Requested wife come to bedside to help orient patient.  Follow closely.    Cirrhosis of liver not due to alcohol - secondary to steatosis, liver enzymes stable.    COPD with chronic bronchitis - continue regular use of home respiratory medications and supplemental oxygen as needed.   Chronic Atrial Fibrillation - holding Xarelto for now in anticipation for surgery this week.   Weight loss / Cachexia - suspect secondary to poor oral intake with abdominal pain related to hernias.  Pt not drinking  now so will run IVFs over today.    Chronic anticoagulation - would hold Xarelto for now as noted above.   Hypertension - IV hydralazine ordered. Continue orals. will continue to follow.   Diabetes mellitus type 2, diet-controlled - monitor blood glucose while in hospital for stress hyperglycemia, sliding scale coverage ordered, A1c pending.    Constipation -  scheduled laxatives ordered. He had a bowel movement just prior to admission after taking miralax for 5 days with mineral oil.   Anemia, chronic disease - stable hemoglobin, will follow.   CKD (chronic kidney disease) stage 3, GFR 30-59 ml/min - avoid nephrotoxins, renally dose medications. Gentle IVF hydration as he is mildly dehydrated. Recheck BMP in AM.   History of gastroesophageal reflux (GERD) - protonix ordered for GI protection.    Generalized weakness - PT evaluation requested. HHPT recommended.   DVT Prophylaxis: mechanical Code Status: FULL Family Communication: at bedside  Disposition Plan: TBD will need HHPT  Consultants:  General surgery  Cardiology [facility]  Subjective: Pt had a bad night.  He started sun-downing. He has been delirious for most of the day.  Very confused.   Objective: Vitals:   05/21/16 1534 05/21/16 2044 05/22/16 1323 05/22/16 1329  BP: (!) 154/50 (!) 175/53 (!) 185/54 (!) 185/54  Pulse: (!) 57 (!) 50 64   Resp: 16 18 20    Temp: 98.8 F (37.1 C) 97.7 F (36.5 C) 98 F (36.7 C)   TempSrc: Oral Oral Axillary   SpO2: 97% 100% 100%   Weight:      Height:        Intake/Output Summary (Last 24 hours) at 05/22/16 1357 Last data filed at 05/22/16 1100  Gross per 24 hour  Intake                0 ml  Output              600 ml  Net             -600 ml   Filed Weights   05/20/16 1402  Weight: 54.4 kg (120 lb)    Exam:  Constitutional: He is confused and delirious. He appears cachectic. No distress.  HENT: Head: Normocephalic and atraumatic.  Mouth/Throat: Oropharynx is clear and moist. No oropharyngeal exudate.  Eyes: EOM are normal. Pupils are equal, round, and reactive to light.  Neck: Normal range of motion. Neck supple.  Cardiovascular: Normal rate, regular rhythm, normal heart sounds and intact distal pulses.No murmur heard. Pulmonary/Chest: Effort normal. No respiratory distress. He has no wheezes. He has no rales.   Abdominal: Soft. He exhibits no distension. There is tenderness (LLQ). A hernia is present. Easily reducible Hernia confirmed positive in the left inguinal area (protruding with mild to moderate tenderness).  Musculoskeletal: Normal range of motion. He exhibits no edema.  Neurological: He is moving all extremities and speaking but confused and seeing things.  Crying sporadically.   Data Reviewed: Basic Metabolic Panel:  Recent Labs Lab 05/16/16 1454 05/20/16 1508 05/21/16 0550  NA 136 139 138  K 4.1 4.1 4.1  CL 105 106 106  CO2 24 28 26   GLUCOSE 98 115* 97  BUN 47* 55* 55*  CREATININE 1.90* 1.75* 2.35*  CALCIUM 8.5* 8.5* 8.2*   Liver Function Tests:  Recent Labs Lab 05/16/16 1454  AST 23  ALT 17  ALKPHOS 73  BILITOT 0.9  PROT 7.0  ALBUMIN 3.5    Recent Labs Lab 05/16/16 1454  LIPASE 23  No results for input(s): AMMONIA in the last 168 hours. CBC:  Recent Labs Lab 05/16/16 1454 05/20/16 1508 05/21/16 0550  WBC 9.6 8.1 6.1  HGB 9.9* 9.4* 8.7*  HCT 30.6* 28.2* 26.3*  MCV 93.3 92.5 92.6  PLT 211 171 154   Cardiac Enzymes: No results for input(s): CKTOTAL, CKMB, CKMBINDEX, TROPONINI in the last 168 hours. BNP (last 3 results) No results for input(s): PROBNP in the last 8760 hours. CBG:  Recent Labs Lab 05/21/16 1334 05/21/16 1713 05/21/16 2039 05/22/16 0909 05/22/16 1126  GLUCAP 152* 143* 100* 106* 104*    No results found for this or any previous visit (from the past 240 hour(s)).   Studies: Dg Abd Acute W/chest  Result Date: 05/20/2016 CLINICAL DATA:  Left lower abdominal pain for 4-5 days EXAM: DG ABDOMEN ACUTE W/ 1V CHEST COMPARISON:  05/16/2016 FINDINGS: There is no evidence of dilated bowel loops or free intraperitoneal air. No radiopaque calculi or other significant radiographic abnormality is seen. Heart size and mediastinal contours are within normal limits. The lungs are hyperinflated likely secondary to COPD. IMPRESSION: Negative  abdominal radiographs.  No acute cardiopulmonary disease. Electronically Signed   By: Kathreen Devoid   On: 05/20/2016 15:37     Scheduled Meds: . antiseptic oral rinse  7 mL Mouth Rinse BID  . digoxin  125 mcg Oral Daily  . donepezil  5 mg Oral QHS  . doxazosin  2 mg Oral QHS  . feeding supplement (ENSURE ENLIVE)  237 mL Oral BID BM  . finasteride  5 mg Oral Daily  . fluticasone furoate-vilanterol  1 puff Inhalation Daily  . haloperidol lactate  1 mg Intravenous Once  . insulin aspart  0-9 Units Subcutaneous TID WC  . isosorbide mononitrate  30 mg Oral Daily  . pantoprazole  40 mg Oral Daily  . polyethylene glycol  17 g Oral Daily  . QUEtiapine  25 mg Oral QHS  . senna-docusate  1 tablet Oral BID  . simvastatin  20 mg Oral QHS   Continuous Infusions:   Principal Problem:   LLQ abdominal pain Active Problems:   CAD (coronary artery disease)   Cirrhosis of liver not due to alcohol    COPD with bronchitis   Atrial Fibrillation   Weight loss   Inguinal hernia   Bilateral inguinal hernia without obstruction or gangrene   Chronic anticoagulation   Hypertension   NASH (nonalcoholic steatohepatitis)   Diabetes mellitus type 2, diet-controlled (HCC)   Constipation   Anemia, chronic disease   CKD (chronic kidney disease) stage 3, GFR 30-59 ml/min   History of gastroesophageal reflux (GERD)   Generalized weakness  Time spent:   Irwin Brakeman, MD, FAAFP [facility] Pager [phone]  If 7PM-7AM, please contact night-coverage [email or web address] Password [email or web address] 05/22/2016, 1:57 PM    LOS: 1 day

## 2016-05-22 NOTE — Progress Notes (Signed)
*  PRELIMINARY RESULTS* Echocardiogram 2D Echocardiogram has been performed.  Cory Lawrence 05/22/2016, 12:34 PM

## 2016-05-22 NOTE — Progress Notes (Signed)
Initial Nutrition Assessment  DOCUMENTATION CODES:   Non-severe (moderate) malnutrition in context of acute illness/injury  INTERVENTION:  1.  General healthful diet; continue to offer foods and beverages as able and as patient is alert and able to do so safely.   2.  Supplements; consider adding supplements post-op or if surgery is delayed.  Currently still considering surgery and with level of confusion may be prudent to wait >/=24 hrs and reassess.    NUTRITION DIAGNOSIS:   Inadequate oral intake related to inability to eat, acute illness as evidenced by meal completion < 50%.  GOAL:   Patient will meet greater than or equal to 90% of their needs  MONITOR:   PO intake, Supplement acceptance  REASON FOR ASSESSMENT:   Consult Assessment of nutrition requirement/status  ASSESSMENT:   RD drawn to chart via consult for assessment of needs.   Patient seen as bedside with RN and MD.  Patient presented with severe hernia requiring surgical repair.  While clearance for surgery was underway pt develop new onset confusion.  Wife has been to hospital frequently, however not available during visit today.  RN states wife cuts food and feeds husband.  Not sure if there may be an underlying concern/component of dysphagia vs. Chewing difficulties.    RD completed physical exam and assessed weight history.  Patient meets criteria for moderate malnutrition. Interventions limited by confusion today.  Will continue to follow and assess.   Diet Order:  Diet Heart Room service appropriate?: Yes; Fluid consistency:: Thin  Skin:  Reviewed, no issues  Last BM:  7/19  Height:   Ht Readings from Last 1 Encounters:  05/20/16 5\' 5"  (1.651 m)    Weight:   Wt Readings from Last 1 Encounters:  05/20/16 120 lb (54.4 kg)    Ideal Body Weight:     BMI:  Body mass index is 19.97 kg/m.  Estimated Nutritional Needs:   Kcal:  1750-1900  Protein:  60g  Fluid:  2.0  EDUCATION NEEDS:    Education needs no appropriate at this time  Brynda Greathouse, [place] RD LDN Clinical Inpatient Dietitian Weekend/After hours pager: [phone]
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
## 2016-05-23 DIAGNOSIS — R1032 Left lower quadrant pain: Secondary | ICD-10-CM | POA: Diagnosis not present

## 2016-05-23 DIAGNOSIS — I480 Paroxysmal atrial fibrillation: Secondary | ICD-10-CM | POA: Diagnosis not present

## 2016-05-23 DIAGNOSIS — D638 Anemia in other chronic diseases classified elsewhere: Secondary | ICD-10-CM | POA: Diagnosis not present

## 2016-05-23 DIAGNOSIS — R410 Disorientation, unspecified: Secondary | ICD-10-CM | POA: Diagnosis not present

## 2016-05-23 LAB — CBC
HCT: 28.6 % — ABNORMAL LOW (ref 39.0–52.0)
HEMOGLOBIN: 9.6 g/dL — AB (ref 13.0–17.0)
MCH: 31 pg (ref 26.0–34.0)
MCHC: 33.6 g/dL (ref 30.0–36.0)
MCV: 92.3 fL (ref 78.0–100.0)
PLATELETS: 121 10*3/uL — AB (ref 150–400)
RBC: 3.1 MIL/uL — ABNORMAL LOW (ref 4.22–5.81)
RDW: 14.9 % (ref 11.5–15.5)
WBC: 7.6 10*3/uL (ref 4.0–10.5)

## 2016-05-23 LAB — BASIC METABOLIC PANEL
Anion gap: 9 (ref 5–15)
BUN: 64 mg/dL — AB (ref 6–20)
CALCIUM: 8.1 mg/dL — AB (ref 8.9–10.3)
CO2: 24 mmol/L (ref 22–32)
CREATININE: 3.64 mg/dL — AB (ref 0.61–1.24)
Chloride: 105 mmol/L (ref 101–111)
GFR, EST AFRICAN AMERICAN: 16 mL/min — AB (ref >60)
GFR, EST NON AFRICAN AMERICAN: 14 mL/min — AB (ref >60)
Glucose, Bld: 96 mg/dL (ref 65–99)
Potassium: 4.5 mmol/L (ref 3.5–5.1)
SODIUM: 138 mmol/L (ref 135–145)

## 2016-05-23 LAB — GLUCOSE, CAPILLARY
GLUCOSE-CAPILLARY: 93 mg/dL (ref 65–99)
GLUCOSE-CAPILLARY: 112 mg/dL — AB (ref 65–99)
GLUCOSE-CAPILLARY: 138 mg/dL — AB (ref 65–99)
Glucose-Capillary: 100 mg/dL — ABNORMAL HIGH (ref 65–99)

## 2016-05-23 LAB — HEMOGLOBIN A1C
Hgb A1c MFr Bld: 5.3 % (ref 4.8–5.6)
Mean Plasma Glucose: 105 mg/dL

## 2016-05-23 MED ORDER — LORAZEPAM 2 MG/ML IJ SOLN
0.5000 mg | Freq: Four times a day (QID) | INTRAMUSCULAR | Status: DC | PRN
  Administered 2016-05-26: 0.5 mg via INTRAVENOUS
  Filled 2016-05-23: qty 1

## 2016-05-23 MED ORDER — SODIUM CHLORIDE 0.9 % IV SOLN
INTRAVENOUS | Status: AC
  Administered 2016-05-23: 22:00:00 via INTRAVENOUS

## 2016-05-23 NOTE — Progress Notes (Signed)
PROGRESS NOTE    Cory Lawrence  [id number]  DOB: Oct 26, 1925  DOA: 05/20/2016 PCP: [name], MD Outpatient Specialists:   Hospital course: HPI: Cory Lawrence is a 82 y.o. male with a complex PMH detailed below presented to the ED for the second time in the last several days with severe recurrent LLQ abdominal pain related to bilateral inguinal hernias complicated by chronic constipation and generalized weakness from poor oral intake. The patient and his wife report that 5-6 days ago the patient picked up something at home and afterwards had severe pain mostly on the left side lower quadrant of abdomen. He reports that his pain is in his inguinal region and that he noted that his hernia had protruded and felt hard like a baseball. He was seen 4 days ago in the emergency department for this issue and per notes had his hernia successfully reduced and was sent Home with outpatient surgical follow-up. The patient and his wife report that it came right back out and the patient has been in severe pain over the last few days at home. He has had very little appetite and has not been able to eat much. He has only had 1 bowel movement over this time. His wife said she had to give him multiple laxatives in order for him to have a bowel movement. He says the pain can be extremely severe and at this morning it was extremely severe. Many years ago he was diagnosed with the hernias and was offered a repair but his symptoms improved on their own and so he refused it.  He is chronically anticoagulated with Xarelto for chronic atrial fibrillation. He has CAD and a completely occluded right coronary managed medically by cardiology. He denies having chest pain. He has COPD and chronic shortness of breath. He also has chronic kidney disease and chronic anemia which has been stable. He was evaluated by general surgery in ED and they requested inpatient admission for medical clearance and  surgical team will consider inguinal hernia repair.   Assessment & Plan:   LLQ abdominal pain - secondary to recurrent left inguinal hernia, reduced in ED but has had multiple recurrences, also has chronic constipation. Admitted for symptom management, pain control, Gen surgery consult.  Bilateral inguinal hernia without obstruction or gangrene - as above, will maximize medical therapy in anticipation for hernia repair next week, I asked cardiology for surgery clearance. He is high risk for surgery given his frailty and many medical co-morbidities.  I had long discussion with wife about patients condition deteriorating,  She is agreeable to a palliative medicine evaluation.  I am concerned that patient would not survive surgery.      CAD (coronary artery disease) - patient has an occluded right coronary that is being medically managed, consulted [facility] cardiology for surgical clearance. He is followed by Grayland Caedon, MD. Check EKG. His is completely asymptomatic at this time. Echo reviewed. See report.    Acute Delirium -  Requested wife come to bedside to help orient patient.  Suspect metabolic encephalopathy.    Cirrhosis of liver not due to alcohol - secondary to steatosis, liver enzymes stable.    COPD with chronic bronchitis - continue regular use of home respiratory medications and supplemental oxygen as needed.   Chronic Atrial Fibrillation - holding Xarelto for now in anticipation for surgery  Weight loss / Cachexia - suspect secondary to poor oral intake with abdominal pain related to hernias.  Pt not drinking now so  will run IVFs over today.    Chronic anticoagulation - would hold Xarelto for now as noted above.   Hypertension - IV hydralazine ordered. Continue orals. will continue to follow.   Diabetes mellitus type 2, diet-controlled - monitor blood glucose while in hospital for stress hyperglycemia, sliding scale coverage ordered, A1c pending.    Constipation  - scheduled laxatives ordered. He had a bowel movement just prior to admission after taking miralax for 5 days with mineral oil.   Anemia, chronic disease - stable hemoglobin, will follow.   CKD (chronic kidney disease) stage 3, GFR 30-59 ml/min - renal function worsening.  Avoid nephrotoxins, renally dose medications. Gentle IVF hydration as he is mildly dehydrated and has poor oral intake.  Following BMP.     History of gastroesophageal reflux (GERD) - protonix ordered for GI protection.    Generalized weakness - PT evaluation requested. HHPT recommended.   DVT Prophylaxis: mechanical Code Status: FULL Family Communication: at bedside  Disposition Plan: TBD will need HHPT  Consultants:  General surgery  Cardiology [facility]  Subjective: Pt continues to decline.  He remains confused.  He is not eating or drinking well.     Objective: Vitals:   05/22/16 1453 05/22/16 2318 05/23/16 0515 05/23/16 1247  BP: (!) 168/50 (!) 166/64 (!) 173/55   Pulse: 79 64 (!) 56 62  Resp:  18 19 18   Temp:  98.1 F (36.7 C) 97.7 F (36.5 C)   TempSrc:   Oral   SpO2:  100% 96% 95%  Weight:      Height:        Intake/Output Summary (Last 24 hours) at 05/23/16 1425 Last data filed at 05/23/16 0300  Gross per 24 hour  Intake           569.17 ml  Output                0 ml  Net           569.17 ml   Filed Weights   05/20/16 1402  Weight: 54.4 kg (120 lb)    Exam:  Constitutional: He is confused and delirious. He appears cachectic. No distress.  HENT: Head: Normocephalic and atraumatic.  Mouth/Throat: Oropharynx is clear and moist. No oropharyngeal exudate.  Eyes: EOM are normal. Pupils are equal, round, and reactive to light.  Neck: Normal range of motion. Neck supple.  Cardiovascular: Normal rate, regular rhythm, normal heart sounds and intact distal pulses.No murmur heard. Pulmonary/Chest: Effort normal. No respiratory distress. He has no wheezes. He has no rales.   Abdominal: Soft. He exhibits no distension. There is tenderness (LLQ). A hernia is present. Easily reducible Hernia confirmed positive in the left inguinal area (protruding with mild to moderate tenderness).  Musculoskeletal: Normal range of motion. He exhibits no edema.  Neurological: He is moving all extremities and speaking but confused and seeing things.  Crying sporadically.   Data Reviewed: Basic Metabolic Panel:  Recent Labs Lab 05/16/16 1454 05/20/16 1508 05/21/16 0550 05/23/16 0854  NA 136 139 138 138  K 4.1 4.1 4.1 4.5  CL 105 106 106 105  CO2 24 28 26 24   GLUCOSE 98 115* 97 96  BUN 47* 55* 55* 64*  CREATININE 1.90* 1.75* 2.35* 3.64*  CALCIUM 8.5* 8.5* 8.2* 8.1*   Liver Function Tests:  Recent Labs Lab 05/16/16 1454  AST 23  ALT 17  ALKPHOS 73  BILITOT 0.9  PROT 7.0  ALBUMIN 3.5    Recent Labs Lab  05/16/16 1454  LIPASE 23   No results for input(s): AMMONIA in the last 168 hours. CBC:  Recent Labs Lab 05/16/16 1454 05/20/16 1508 05/21/16 0550 05/23/16 0854  WBC 9.6 8.1 6.1 7.6  HGB 9.9* 9.4* 8.7* 9.6*  HCT 30.6* 28.2* 26.3* 28.6*  MCV 93.3 92.5 92.6 92.3  PLT 211 171 154 121*   Cardiac Enzymes: No results for input(s): CKTOTAL, CKMB, CKMBINDEX, TROPONINI in the last 168 hours. BNP (last 3 results) No results for input(s): PROBNP in the last 8760 hours. CBG:  Recent Labs Lab 05/22/16 1126 05/22/16 1704 05/22/16 2357 05/23/16 0733 05/23/16 1204  GLUCAP 104* 102* 112* 93 112*    No results found for this or any previous visit (from the past 240 hour(s)).   Studies: No results found.   Scheduled Meds: . antiseptic oral rinse  7 mL Mouth Rinse BID  . donepezil  5 mg Oral QHS  . doxazosin  2 mg Oral QHS  . feeding supplement (ENSURE ENLIVE)  237 mL Oral BID BM  . finasteride  5 mg Oral Daily  . fluticasone furoate-vilanterol  1 puff Inhalation Daily  . insulin aspart  0-9 Units Subcutaneous TID WC  . isosorbide mononitrate  30  mg Oral Daily  . pantoprazole  40 mg Oral Daily  . polyethylene glycol  17 g Oral Daily  . senna-docusate  1 tablet Oral BID  . simvastatin  20 mg Oral QHS   Continuous Infusions: . sodium chloride     Principal Problem:   LLQ abdominal pain Active Problems:   CAD (coronary artery disease)   Cirrhosis of liver not due to alcohol    COPD with bronchitis   Atrial Fibrillation   Weight loss   Inguinal hernia   Bilateral inguinal hernia without obstruction or gangrene   Chronic anticoagulation   Acute delirium   Hypertension   NASH (nonalcoholic steatohepatitis)   Diabetes mellitus type 2, diet-controlled (HCC)   Constipation   Anemia, chronic disease   CKD (chronic kidney disease) stage 3, GFR 30-59 ml/min   Malnutrition of moderate degree   History of gastroesophageal reflux (GERD)   Generalized weakness  Time spent:   Irwin Brakeman, MD, FAAFP [facility] Pager [phone]  If 7PM-7AM, please contact night-coverage [email or web address] Password [email or web address] 05/23/2016, 2:25 PM    LOS: 1 day

## 2016-05-23 NOTE — Progress Notes (Signed)
Due to irregular rhythm, this RN performed and EKG x2 this AM. Gave Digoxin early. Administered in applesauce. Patient was able to swallow with a large amount of time provided and much coaching.

## 2016-05-23 NOTE — Progress Notes (Signed)
Patient has been made a DNR. Has not been able to follow commands sufficiently enough to take oral medications. Speech therapy consult placed. Patient having multiple episodes of bradycardia with sinus pause. Night coverage notified. Will continue to monitor.

## 2016-05-23 NOTE — Evaluation (Signed)
Clinical/Bedside Swallow Evaluation Patient Details  Name: Kacey Kabel MRN: [id number] Date of Birth: 1925-05-26  Today's Date: 05/23/2016 Time: SLP Start Time (ACUTE ONLY): 30 SLP Stop Time (ACUTE ONLY): 1545 SLP Time Calculation (min) (ACUTE ONLY): 40 min  Past Medical History:  Past Medical History:  Diagnosis Date  . Anxiety   . Arthritis   . Biliary cirrhosis ([place])   . BPH (benign prostatic hypertrophy)   . CAD (coronary artery disease)    prior abnormal Myoview; subsequent cath; managed medically  . Chronic anticoagulation   . COPD (chronic obstructive pulmonary disease) ([place])   . Diabetes mellitus type 2, diet-controlled ([place])    managed with diet  . Diaphoresis   . Diverticulosis of colon (without mention of hemorrhage)   . Dizziness   . GERD (gastroesophageal reflux disease)   . Heart attack ([place])   . Hyperlipidemia   . Hypertension   . Internal hemorrhoids without mention of complication   . NASH (nonalcoholic steatohepatitis)   . PAF (paroxysmal atrial fibrillation) (HCC)    on Xarelto  . Palpitations   . Personal history of colonic polyps 07/08/2002,06/2011   tubular adenoma, hyperplastic  . Ulcer    Gastric/bleeding  . Unspecified gastritis and gastroduodenitis without mention of hemorrhage   . Vitamin B12 deficiency    Past Surgical History:  Past Surgical History:  Procedure Laterality Date  . CARDIAC CATHETERIZATION  March 2013   Managed medically  . CATARACT EXTRACTION     bilateral  . EYE SURGERY     left eye x multiple   HPI:  pt is a 82 yo male adm to [facility] with left lower quadrant pain  and poor intake.  Pt found to have possible left lobe pna - vs ATX.  Swallow evaluation ordered due to pt's mental status changes.     Assessment / Plan / Recommendation Clinical Impression  Pt presents with minimal oropharyngeal dysphagia = likely due to cognition and poor dentition *pt lost partials at [facility] per spouse.   No indication of airway  compromise with intake, however pt takes large sequential boluses followed by post=swallow inhalation.  This swallow/respiration pattern may lead to episodic aspiration.  Wife reports pt with oral holding at times, requiring her to "tell him to swallow".  Wife also admits pt eats rapidly and takes large boluses, note tremor which impairs self feeding.    Recommend OT to determine if adaptive equipment may be helpful to aid self feeding.  Reviewed aspiration precautions and compensations to mitigate risk .    Will follow up x1 to assure education completed as large volume of education provided.  Thanks for this referral.     Aspiration Risk  Mild aspiration risk    Diet Recommendation Regular;Thin liquid (encouraged pt to have softer items and finger foods may be easier)   Liquid Administration via: Cup;Straw Medication Administration: Whole meds with puree Supervision: Patient able to self feed;Full supervision/cueing for compensatory strategies; use puree to aid oral transiting Compensations: Slow rate;Small sips/bites Postural Changes: Seated upright at 90 degrees;Remain upright for at least 30 minutes after po intake    Other  Recommendations Oral Care Recommendations: Oral care BID   Follow up Recommendations       Frequency and Duration min 1 x/week  1 week       Prognosis Prognosis for Safe Diet Advancement: Fair Barriers to Reach Goals: Cognitive deficits      Swallow Study   General Date of Onset: 05/23/16 HPI:  pt is a 82 yo male adm to [facility] with left lower quadrant pain  and poor intake.  Pt found to have possible left lobe pna - vs ATX.  Swallow evaluation ordered due to pt's mental status changes.   Type of Study: Bedside Swallow Evaluation Diet Prior to this Study: Regular;Thin liquids Temperature Spikes Noted: No Respiratory Status: Room air History of Recent Intubation: No Behavior/Cognition: Alert;Cooperative;Pleasant mood Oral Cavity Assessment: Within  Functional Limits Oral Care Completed by SLP: No Oral Cavity - Dentition: Missing dentition;Adequate natural dentition (partials lost while at hospital in March per pt's wife) Vision: Functional for self-feeding Self-Feeding Abilities: Needs assist Patient Positioning: Upright in bed Baseline Vocal Quality: Normal Volitional Cough: Strong Volitional Swallow: Able to elicit    Oral/Motor/Sensory Function Overall Oral Motor/Sensory Function: Generalized oral weakness   Ice Chips Ice chips: Not tested   Thin Liquid Thin Liquid: Impaired Presentation: Straw;Cup Oral Phase Impairments: Reduced labial seal;Reduced lingual movement/coordination Pharyngeal  Phase Impairments: Multiple swallows Other Comments: pt takes large sequential boluses - followed by inhalation,  cues to take smaller boluses needed    Nectar Thick Nectar Thick Liquid: Not tested   Honey Thick Honey Thick Liquid: Not tested   Puree Puree: Not tested   Solid   GO   Solid: Impaired Presentation: Self Fed;Spoon Oral Phase Impairments: Reduced lingual movement/coordination;Impaired mastication;Poor awareness of bolus Oral Phase Functional Implications: Prolonged oral transit;Oral residue Pharyngeal Phase Impairments: Suspected delayed Swallow Other Comments: use of apple sauce faciliated clearance    Functional Assessment Tool Used: clinical judgement Functional Limitations: Swallowing Swallow Current Status [id number]): At least 20 percent but less than 40 percent impaired, limited or restricted Swallow Goal Status [phone]): At least 1 percent but less than 20 percent impaired, limited or restricted   Luanna Salk, [facility] SLP [phone]

## 2016-05-23 NOTE — Progress Notes (Signed)
Physical Therapy Treatment Patient Details Name: Cory Lawrence MRN: [id number] DOB: June 12, 1925 Today's Date: 05/23/2016    History of Present Illness 82 yo male admitted with LLQ pain due to hernia. hx of COPD, HTN, DM, CAD, PAF, arthritis.     PT Comments    Pt required increased assistance and a walker on today. Repeated multimodal cueing for basic mobility tasks required as well. He is weaker and fatigues easily. Discussed d/c plan with wife-she is unsure if she can manage safely with pt at home in his current condition. D/c plan has been updated to ST SNF at this time. Will continue to follow and progress activity as tolerated.   Follow Up Recommendations  SNF     Equipment Recommendations  Rolling walker with 5" wheels (possibly 4 wheeled-will continue to assess)    Recommendations for Other Services       Precautions / Restrictions Precautions Precautions: Fall Restrictions Weight Bearing Restrictions: No    Mobility  Bed Mobility Overal bed mobility: Needs Assistance Bed Mobility: Supine to Sit;Sit to Supine     Supine to sit: Min guard Sit to supine: Min guard   General bed mobility comments: close guard for safety. Increased time. Repeated cueing needed on today  Transfers Overall transfer level: Needs assistance Equipment used: Rolling walker (2 wheeled) Transfers: Sit to/from Stand Sit to Stand: Min assist         General transfer comment: Assist to rise, stabilize, control descent. Multimodal cueing for safety, technique, hand placement.   Ambulation/Gait Ambulation/Gait assistance: Min assist Ambulation Distance (Feet): 25 Feet Assistive device: Rolling walker (2 wheeled) Gait Pattern/deviations: Step-through pattern;Decreased stride length;Trunk flexed     General Gait Details: Assist to stabilize and maneuver safely with. Very slow gait speed. Pt is weak and fatigues easily on today. Pt was easily distracted by environment/surroundings as  well.   Stairs            Wheelchair Mobility    Modified Rankin (Stroke Patients Only)       Balance Overall balance assessment: Needs assistance         Standing balance support: Bilateral upper extremity supported;During functional activity Standing balance-Leahy Scale: Poor                      Cognition Arousal/Alertness: Awake/alert Behavior During Therapy: WFL for tasks assessed/performed Overall Cognitive Status: Impaired/Different from baseline Area of Impairment: Following commands;Problem solving       Following Commands: Follows one step commands inconsistently     Problem Solving: Requires tactile cues;Requires verbal cues;Slow processing      Exercises      General Comments        Pertinent Vitals/Pain Pain Assessment: Faces Faces Pain Scale: Hurts little more Pain Location: abdomen    Home Living                      Prior Function            PT Goals (current goals can now be found in the care plan section) Progress towards PT goals: Progressing toward goals (very slowly)    Frequency  Min 3X/week    PT Plan Discharge plan needs to be updated    Co-evaluation             End of Session Equipment Utilized During Treatment: Gait belt Activity Tolerance: Patient limited by fatigue Patient left: in bed;with call bell/phone within reach;with bed alarm set;with  family/visitor present     Time: [id number] PT Time Calculation (min) (ACUTE ONLY): 21 min  Charges:  $Gait Training: 8-22 mins                    G Codes:      [place]on Anna [facility] 05/23/2016, 2:30 PM

## 2016-05-23 NOTE — Evaluation (Signed)
SLP Cancellation Note  Patient Details Name: Cory Lawrence MRN: [id number] DOB: 03/26/1925   Cancelled treatment:       Reason Eval/Treat Not Completed: Other (comment) (pt with PT,will continue efforts) Luanna Salk, [facility] SLP [phone]

## 2016-05-23 NOTE — Progress Notes (Signed)
  Subjective: Resting comfortably but still disoriented  Objective: Vital signs in last 24 hours: Temp:  [97.7 F (36.5 C)-98.1 F (36.7 C)] 97.7 F (36.5 C) (07/24 0515) Pulse Rate:  [56-79] 56 (07/24 0515) Resp:  [18-20] 19 (07/24 0515) BP: (166-185)/(50-64) 173/55 (07/24 0515) SpO2:  [96 %-100 %] 96 % (07/24 0515) Last BM Date: 05/23/16  Intake/Output from previous day: 07/23 0701 - 07/24 0700 In: 569.2 [I.V.:569.2] Out: 600 [Urine:600] Intake/Output this shift: No intake/output data recorded.  Resp: clear to auscultation bilaterally Cardio: regular rate and rhythm GI: soft, flat. left inguinal hernia sticks but is reducible  Lab Results:   Recent Labs  05/20/16 1508 05/21/16 0550  WBC 8.1 6.1  HGB 9.4* 8.7*  HCT 28.2* 26.3*  PLT 171 154   BMET  Recent Labs  05/20/16 1508 05/21/16 0550  NA 139 138  K 4.1 4.1  CL 106 106  CO2 28 26  GLUCOSE 115* 97  BUN 55* 55*  CREATININE 1.75* 2.35*  CALCIUM 8.5* 8.2*   PT/INR No results for input(s): LABPROT, INR in the last 72 hours. ABG No results for input(s): PHART, HCO3 in the last 72 hours.  Invalid input(s): PCO2, PO2  Studies/Results: No results found.  Anti-infectives: Anti-infectives    None      Assessment/Plan: s/p * No surgery found * would benefit from having hernia fixed during this hospitalization if he can tolerate medically  Will follow  LOS: 1 day    TOTH III,PAUL S 05/23/2016

## 2016-05-23 NOTE — Progress Notes (Signed)
Chaplain providing emotional and spiritual support with pt and spouse around goals of care / discharge planning.    Conversation with spouse, [name].  She reported exhaustion from caring for pt at home.  Due to progression of dementia, pt is not understanding Shirley's limitations and inability to continue to care for pt in home environment.  She is fearful that he will be angry if he has to stay in rehabilitation for extended period of time and feels "stuck."   She expresses grief around incarceration of son, as he is not able to support pt. As well as worry for her own needs after pt dies.   Stated she and pt. Depleted financial resources in providing legal defense for son and does not feel she can afford home care.  Chaplain provided empathic presence, spiritual support.     Note regarding bizarre presentation:  [name] and chaplain spoke for 45 minutes.  Through majority of conversation, she was grounded and appropriate in presentation with congruent emotional content.   Late in conversation she began to speak about an injury she sustained to her shoulder when falling in the parking lot of a restaurant, and she began to exhibit characteristics of paranoia.  She described a legal battle with owner and insurance company.  She then related that she feels this restaurant owner is ruining their lives by "controlling the TV,"  "listening to conversations" (She described hearing clicks and feeling like listening devices were on as well as feeling like the wires on the back of TV were listening).  Additionally, she described his "following [place] through tracking device on car and sending people to harass [place]," controlling lighting in house, etc.  She worries that "if D[place] dies, he (owner) can come after me."    It is not clear how this affects her relationship with healthcare decisions or the potential for her to trust / feel safe with hospital staff.  She did relate to chaplain that she feels safe in  hospital.   Chaplain reported this experience to RN.     [place], [place]

## 2016-05-24 DIAGNOSIS — I5042 Chronic combined systolic (congestive) and diastolic (congestive) heart failure: Secondary | ICD-10-CM | POA: Diagnosis present

## 2016-05-24 DIAGNOSIS — Z681 Body mass index (BMI) 19 or less, adult: Secondary | ICD-10-CM | POA: Diagnosis not present

## 2016-05-24 DIAGNOSIS — R410 Disorientation, unspecified: Secondary | ICD-10-CM | POA: Diagnosis not present

## 2016-05-24 DIAGNOSIS — E860 Dehydration: Secondary | ICD-10-CM | POA: Diagnosis present

## 2016-05-24 DIAGNOSIS — N189 Chronic kidney disease, unspecified: Secondary | ICD-10-CM | POA: Diagnosis not present

## 2016-05-24 DIAGNOSIS — R100 Acute abdomen: Secondary | ICD-10-CM | POA: Diagnosis not present

## 2016-05-24 DIAGNOSIS — Z7189 Other specified counseling: Secondary | ICD-10-CM | POA: Diagnosis not present

## 2016-05-24 DIAGNOSIS — K7581 Nonalcoholic steatohepatitis (NASH): Secondary | ICD-10-CM | POA: Diagnosis not present

## 2016-05-24 DIAGNOSIS — Z7901 Long term (current) use of anticoagulants: Secondary | ICD-10-CM | POA: Diagnosis not present

## 2016-05-24 DIAGNOSIS — E785 Hyperlipidemia, unspecified: Secondary | ICD-10-CM | POA: Diagnosis present

## 2016-05-24 DIAGNOSIS — K743 Primary biliary cirrhosis: Secondary | ICD-10-CM | POA: Diagnosis present

## 2016-05-24 DIAGNOSIS — I2510 Atherosclerotic heart disease of native coronary artery without angina pectoris: Secondary | ICD-10-CM | POA: Diagnosis not present

## 2016-05-24 DIAGNOSIS — D638 Anemia in other chronic diseases classified elsewhere: Secondary | ICD-10-CM | POA: Diagnosis not present

## 2016-05-24 DIAGNOSIS — I480 Paroxysmal atrial fibrillation: Secondary | ICD-10-CM | POA: Diagnosis present

## 2016-05-24 DIAGNOSIS — I10 Essential (primary) hypertension: Secondary | ICD-10-CM | POA: Diagnosis not present

## 2016-05-24 DIAGNOSIS — R1032 Left lower quadrant pain: Secondary | ICD-10-CM | POA: Diagnosis present

## 2016-05-24 DIAGNOSIS — I252 Old myocardial infarction: Secondary | ICD-10-CM | POA: Diagnosis not present

## 2016-05-24 DIAGNOSIS — R1310 Dysphagia, unspecified: Secondary | ICD-10-CM | POA: Diagnosis present

## 2016-05-24 DIAGNOSIS — Z7951 Long term (current) use of inhaled steroids: Secondary | ICD-10-CM | POA: Diagnosis not present

## 2016-05-24 DIAGNOSIS — K4001 Bilateral inguinal hernia, with obstruction, without gangrene, recurrent: Secondary | ICD-10-CM | POA: Diagnosis not present

## 2016-05-24 DIAGNOSIS — Z515 Encounter for palliative care: Secondary | ICD-10-CM

## 2016-05-24 DIAGNOSIS — I482 Chronic atrial fibrillation: Secondary | ICD-10-CM | POA: Diagnosis present

## 2016-05-24 DIAGNOSIS — E1122 Type 2 diabetes mellitus with diabetic chronic kidney disease: Secondary | ICD-10-CM | POA: Diagnosis present

## 2016-05-24 DIAGNOSIS — K4021 Bilateral inguinal hernia, without obstruction or gangrene, recurrent: Secondary | ICD-10-CM | POA: Diagnosis present

## 2016-05-24 DIAGNOSIS — E440 Moderate protein-calorie malnutrition: Secondary | ICD-10-CM | POA: Diagnosis present

## 2016-05-24 DIAGNOSIS — J449 Chronic obstructive pulmonary disease, unspecified: Secondary | ICD-10-CM | POA: Diagnosis present

## 2016-05-24 DIAGNOSIS — N289 Disorder of kidney and ureter, unspecified: Secondary | ICD-10-CM | POA: Diagnosis not present

## 2016-05-24 DIAGNOSIS — I130 Hypertensive heart and chronic kidney disease with heart failure and stage 1 through stage 4 chronic kidney disease, or unspecified chronic kidney disease: Secondary | ICD-10-CM | POA: Diagnosis present

## 2016-05-24 DIAGNOSIS — I5043 Acute on chronic combined systolic (congestive) and diastolic (congestive) heart failure: Secondary | ICD-10-CM | POA: Diagnosis not present

## 2016-05-24 DIAGNOSIS — K4020 Bilateral inguinal hernia, without obstruction or gangrene, not specified as recurrent: Secondary | ICD-10-CM | POA: Diagnosis not present

## 2016-05-24 DIAGNOSIS — N183 Chronic kidney disease, stage 3 (moderate): Secondary | ICD-10-CM | POA: Diagnosis not present

## 2016-05-24 DIAGNOSIS — Z87891 Personal history of nicotine dependence: Secondary | ICD-10-CM | POA: Diagnosis not present

## 2016-05-24 DIAGNOSIS — N179 Acute kidney failure, unspecified: Secondary | ICD-10-CM | POA: Diagnosis present

## 2016-05-24 DIAGNOSIS — J690 Pneumonitis due to inhalation of food and vomit: Secondary | ICD-10-CM | POA: Diagnosis not present

## 2016-05-24 DIAGNOSIS — Z7952 Long term (current) use of systemic steroids: Secondary | ICD-10-CM | POA: Diagnosis not present

## 2016-05-24 DIAGNOSIS — F0390 Unspecified dementia without behavioral disturbance: Secondary | ICD-10-CM | POA: Diagnosis not present

## 2016-05-24 LAB — GLUCOSE, CAPILLARY
GLUCOSE-CAPILLARY: 113 mg/dL — AB (ref 65–99)
GLUCOSE-CAPILLARY: 121 mg/dL — AB (ref 65–99)
Glucose-Capillary: 97 mg/dL (ref 65–99)
Glucose-Capillary: 109 mg/dL — ABNORMAL HIGH (ref 65–99)

## 2016-05-24 MED ORDER — SODIUM CHLORIDE 0.9 % IV SOLN
INTRAVENOUS | Status: DC
  Administered 2016-05-24 – 2016-05-25 (×2): via INTRAVENOUS

## 2016-05-24 NOTE — Progress Notes (Addendum)
PROGRESS NOTE    Cory Lawrence  XIP:[id number]  DOB: Oct 12, 1925  DOA: 05/20/2016 PCP: [name], MD Outpatient Specialists:   Hospital course: HPI: Cory Lawrence is a 82 y.o. male with a complex PMH detailed below presented to the ED for the second time in the last several days with severe recurrent LLQ abdominal pain related to bilateral inguinal hernias complicated by chronic constipation and generalized weakness from poor oral intake. The patient and his wife report that 5-6 days ago the patient picked up something at home and afterwards had severe pain mostly on the left side lower quadrant of abdomen. He reports that his pain is in his inguinal region and that he noted that his hernia had protruded and felt hard like a baseball. He was seen 4 days ago in the emergency department for this issue and per notes had his hernia successfully reduced and was sent Home with outpatient surgical follow-up. The patient and his wife report that it came right back out and the patient has been in severe pain over the last few days at home. He has had very little appetite and has not been able to eat much. He has only had 1 bowel movement over this time. His wife said she had to give him multiple laxatives in order for him to have a bowel movement. He says the pain can be extremely severe and at this morning it was extremely severe. Many years ago he was diagnosed with the hernias and was offered a repair but his symptoms improved on their own and so he refused it.  He is chronically anticoagulated with Xarelto for chronic atrial fibrillation. He has CAD and a completely occluded right coronary managed medically by cardiology. He denies having chest pain. He has COPD and chronic shortness of breath. He also has chronic kidney disease and chronic anemia which has been stable. He was evaluated by general surgery in ED and they requested inpatient admission for medical clearance and  surgical team will consider inguinal hernia repair.   Assessment & Plan:   LLQ abdominal pain - secondary to recurrent left inguinal hernia, reduced in ED but has had multiple recurrences, also has chronic constipation. Admitted for symptom management, pain control, Gen surgery consult.  Bilateral inguinal hernia without obstruction or gangrene - as above, will maximize medical therapy in anticipation for hernia repair next week, I asked cardiology for surgery clearance. He is high risk for surgery given his frailty and many medical co-morbidities.  I had long discussion with wife about patients condition deteriorating,  She is agreeable to a palliative medicine evaluation.  She met with them today and reported that if his kidney function worsens or doesn't improve she likely would not want him to go to OR.  I am concerned that patient would not survive surgery.      CAD (coronary artery disease) - patient has an occluded right coronary that is being medically managed, consulted [facility] cardiology for surgical clearance. He is followed by Grayland Mardy, MD. Check EKG. His is completely asymptomatic at this time. Echo reviewed. See report.    Acute Delirium -  Requested wife come to bedside to help orient patient.  Suspect metabolic encephalopathy.    Cirrhosis of liver not due to alcohol - secondary to steatosis, liver enzymes stable.    COPD with chronic bronchitis - continue regular use of home respiratory medications and supplemental oxygen as needed.   Chronic Atrial Fibrillation - holding Xarelto for now in  anticipation for surgery.  Holding digoxin now because of acute renal failure, checking dig level, restart at lower dose if appropriate.   Weight loss / Cachexia - suspect secondary to poor oral intake with abdominal pain related to hernias.  Pt not drinking now so will continue IVFs.    Chronic anticoagulation - would hold Xarelto for now as noted above.   Hypertension -  IV hydralazine ordered. Continue orals. will continue to follow.   Diabetes mellitus type 2, diet-controlled - monitor blood glucose while in hospital for stress hyperglycemia, sliding scale coverage ordered, A1c pending.    Constipation - scheduled laxatives ordered. He had a bowel movement just prior to admission after taking miralax for 5 days with mineral oil.   Anemia, chronic disease - stable hemoglobin, will follow.   Acute Renal Failure on CKD (chronic kidney disease) stage 3, GFR 30-59 ml/min - renal function worsening.  Avoid nephrotoxins, renally dose medications. Gentle IVF hydration as he is mildly dehydrated and has poor oral intake.  Following BMP.     History of gastroesophageal reflux (GERD) - protonix ordered for GI protection.    Generalized weakness - PT evaluation requested. HHPT recommended.   DVT Prophylaxis: mechanical Code Status: FULL Family Communication: at bedside  Disposition Plan: TBD will need HHPT  Consultants:  General surgery  Cardiology [facility]  Palliative medicine  Subjective: Pt is still not eating or drinking well.      Objective: Vitals:   05/23/16 2113 05/24/16 0614 05/24/16 0802 05/24/16 1411  BP: (!) 153/51 (!) 156/51  (!) 140/52  Pulse: (!) 46 (!) 51  61  Resp: 18 (!) 22  18  Temp: 97.6 F (36.4 C) 97.5 F (36.4 C)  97.7 F (36.5 C)  TempSrc: Oral Oral  Oral  SpO2: 100% 100% 96% 97%  Weight:      Height:        Intake/Output Summary (Last 24 hours) at 05/24/16 1651 Last data filed at 05/24/16 1300  Gross per 24 hour  Intake           703.75 ml  Output             1350 ml  Net          -646.25 ml   Filed Weights   05/20/16 1402  Weight: 54.4 kg (120 lb)    Exam:  Constitutional: He is confused and delirious. He appears cachectic. No distress.  HENT: Head: Normocephalic and atraumatic.  Mouth/Throat: Oropharynx is clear and moist. No oropharyngeal exudate.  Eyes: EOM are normal. Pupils are equal,  round, and reactive to light.  Neck: Normal range of motion. Neck supple.  Cardiovascular: Normal rate, regular rhythm, normal heart sounds and intact distal pulses.No murmur heard. Pulmonary/Chest: Effort normal. No respiratory distress. He has no wheezes. He has no rales.  Abdominal: Soft. He exhibits no distension. There is tenderness (LLQ). A hernia is present. Easily reducible Hernia confirmed positive in the left inguinal area (protruding with mild to moderate tenderness).  Musculoskeletal: Normal range of motion. He exhibits no edema.  Neurological: He is moving all extremities and speaking but confused and seeing things.  Crying sporadically.   Data Reviewed: Basic Metabolic Panel:  Recent Labs Lab 05/20/16 1508 05/21/16 0550 05/23/16 0854  NA 139 138 138  K 4.1 4.1 4.5  CL 106 106 105  CO2 _0 GLUCOSE 115* 97 96  BUN 55* 55* 64*  CREATININE 1.75* 2.35* 3.64*  CALCIUM 8.5* 8.2* 8.1*  Liver Function Tests: No results for input(s): AST, ALT, ALKPHOS, BILITOT, PROT, ALBUMIN in the last 168 hours. No results for input(s): LIPASE, AMYLASE in the last 168 hours. No results for input(s): AMMONIA in the last 168 hours. CBC:  Recent Labs Lab 05/20/16 1508 05/21/16 0550 05/23/16 0854  WBC 8.1 6.1 7.6  HGB 9.4* 8.7* 9.6*  HCT 28.2* 26.3* 28.6*  MCV 92.5 92.6 92.3  PLT 171 154 121*   Cardiac Enzymes: No results for input(s): CKTOTAL, CKMB, CKMBINDEX, TROPONINI in the last 168 hours. BNP (last 3 results) No results for input(s): PROBNP in the last 8760 hours. CBG:  Recent Labs Lab 05/23/16 1649 05/23/16 2119 05/24/16 0731 05/24/16 1134 05/24/16 1630  GLUCAP 138* 100* 97 113* 109*    No results found for this or any previous visit (from the past 240 hour(s)).   Studies: No results found.  Scheduled Meds: . antiseptic oral rinse  7 mL Mouth Rinse BID  . donepezil  5 mg Oral QHS  . doxazosin  2 mg Oral QHS  . feeding supplement (ENSURE ENLIVE)  237  mL Oral BID BM  . finasteride  5 mg Oral Daily  . fluticasone furoate-vilanterol  1 puff Inhalation Daily  . insulin aspart  0-9 Units Subcutaneous TID WC  . isosorbide mononitrate  30 mg Oral Daily  . pantoprazole  40 mg Oral Daily  . polyethylene glycol  17 g Oral Daily  . senna-docusate  1 tablet Oral BID  . simvastatin  20 mg Oral QHS   Continuous Infusions: . sodium chloride     Principal Problem:   LLQ abdominal pain Active Problems:   CAD (coronary artery disease)   Cirrhosis of liver not due to alcohol    COPD with bronchitis   Atrial Fibrillation   Weight loss   Inguinal hernia   Bilateral inguinal hernia without obstruction or gangrene   Chronic anticoagulation   Acute delirium   Hypertension   NASH (nonalcoholic steatohepatitis)   Diabetes mellitus type 2, diet-controlled (HCC)   Constipation   Anemia, chronic disease   CKD (chronic kidney disease) stage 3, GFR 30-59 ml/min   Malnutrition of moderate degree   History of gastroesophageal reflux (GERD)   Generalized weakness   Acute renal failure (ARF) ([place])  Time spent:   Irwin Brakeman, MD, FAAFP [facility] Pager [phone]  If 7PM-7AM, please contact night-coverage [email or web address] Password [email or web address] 05/24/2016, 4:51 PM    LOS: 1 day

## 2016-05-24 NOTE — Progress Notes (Signed)
Follow up with Cory Lawrence for continued emotional support.  Introduced spiritual care as resource with pt.    Cory Lawrence spent night at hospital again last night.  From previous conversations, she is fearful that if she goes home, Cory Lawrence will have a difficult night.  She seemed to be in good spirits today.  Both reported that Cory Lawrence had a good day.  He speaks highly of his care and has trust in the medical team.   Re: decisions around sx: Cory Lawrence and chaplain spoke about making decisions that are congruent with who they are.  Cory Lawrence expressed he is waiting for the Doctors to tell him what to do next, as "they have all the information."    They are welcoming of continued follow up for support around healthcare decisions and discharge plans.  This chaplain will continue to follow.    [place], [place]

## 2016-05-24 NOTE — Progress Notes (Signed)
No charge note.   Attempting to reach [name] by phone to arrange an appt time.  Cory Lawrence, [place] [facility] Pager: [phone]

## 2016-05-24 NOTE — Progress Notes (Signed)
Patient ID: Cory Lawrence, male   DOB: [birth year], 82 y.o.   MRN: [id number]  [facility]  Subjective: Patient in bed, wife at bedside.  Some pain earlier, now comfortable.  Objective: Vital signs in last 24 hours: Temp:  [97.5 F (36.4 C)-97.7 F (36.5 C)] 97.7 F (36.5 C) (07/25 1411) Pulse Rate:  [46-61] 61 (07/25 1411) Resp:  [18-22] 18 (07/25 1411) BP: (140-156)/(51-52) 140/52 (07/25 1411) SpO2:  [96 %-100 %] 97 % (07/25 1411) Last BM Date: 05/22/16  Intake/Output from previous day: 07/24 0701 - 07/25 0700 In: 723.8 [P.O.:120; I.V.:603.8] Out: 650 [Urine:650] Intake/Output this shift: Total I/O In: 100 [P.O.:100] Out: 700 [Urine:700]  Physical Exam: HEENT - sclerae clear, mucous membranes moist Abdomen - bilat inguinal herniae; soft, right easily reducible; left soft, reducible with manipulation, mild discomfort  Lab Results:   Recent Labs  05/23/16 0854  WBC 7.6  HGB 9.6*  HCT 28.6*  PLT 121*   BMET  Recent Labs  05/23/16 0854  NA 138  K 4.5  CL 105  CO2 24  GLUCOSE 96  BUN 64*  CREATININE 3.64*  CALCIUM 8.1*   PT/INR No results for input(s): LABPROT, INR in the last 72 hours. Comprehensive Metabolic Panel:    Component Value Date/Time   NA 138 05/23/2016 0854   NA 138 05/21/2016 0550   NA 140 08/11/2014 1345   NA 137 06/16/2014 1330   K 4.5 05/23/2016 0854   K 4.1 05/21/2016 0550   K 4.4 08/11/2014 1345   K 4.1 06/16/2014 1330   CL 105 05/23/2016 0854   CL 106 05/21/2016 0550   CO2 24 05/23/2016 0854   CO2 26 05/21/2016 0550   CO2 25 08/11/2014 1345   CO2 26 06/16/2014 1330   BUN 64 (H) 05/23/2016 0854   BUN 55 (H) 05/21/2016 0550   BUN 38.2 (H) 08/11/2014 1345   BUN 42.8 (H) 06/16/2014 1330   CREATININE 3.64 (H) 05/23/2016 0854   CREATININE 2.35 (H) 05/21/2016 0550   CREATININE 1.22 (H) 01/07/2016 1600   CREATININE 1.20 (H) 12/31/2015 1454   CREATININE 0.9 08/11/2014 1345   CREATININE  1.2 06/16/2014 1330   GLUCOSE 96 05/23/2016 0854   GLUCOSE 97 05/21/2016 0550   GLUCOSE 109 08/11/2014 1345   GLUCOSE 164 (H) 06/16/2014 1330   GLUCOSE 120 (H) 10/05/2006 0919   CALCIUM 8.1 (L) 05/23/2016 0854   CALCIUM 8.2 (L) 05/21/2016 0550   CALCIUM 8.8 08/11/2014 1345   CALCIUM 8.5 06/16/2014 1330   AST 23 05/16/2016 1454   AST 23 12/31/2015 1720   AST 20 04/07/2014 1507   ALT 17 05/16/2016 1454   ALT 19 12/31/2015 1720   ALT 15 04/07/2014 1507   ALKPHOS 73 05/16/2016 1454   ALKPHOS 50 12/31/2015 1720   ALKPHOS 102 04/07/2014 1507   BILITOT 0.9 05/16/2016 1454   BILITOT 0.5 12/31/2015 1720   BILITOT 0.38 04/07/2014 1507   PROT 7.0 05/16/2016 1454   PROT 6.8 12/31/2015 1720   PROT 6.5 04/07/2014 1507   ALBUMIN 3.5 05/16/2016 1454   ALBUMIN 3.3 (L) 12/31/2015 1720   ALBUMIN 2.8 (L) 04/07/2014 1507    Studies/Results: No results found.  Assessment & Plans: Bilateral inguinal herniae, reducible  No sign of incarceration or strangulation  Palliative care consult pending  No need for urgent operation at this time  Will follow  Earnstine Regal, MD, [facility] Office: [place]  05/24/2016   

## 2016-05-24 NOTE — Care Management Obs Status (Signed)
[place] NOTIFICATION   Patient Details  Name: Cory Lawrence MRN: [id number] Date of Birth: Aug 01, 1925   Medicare Observation Status Notification Given:  Yes    Guadalupe Maple, RN 05/24/2016, 1:02 PM

## 2016-05-24 NOTE — NC FL2 (Signed)
[place] LEVEL OF CARE SCREENING TOOL     IDENTIFICATION  Patient Name: Cory Lawrence Birthdate: 1925/02/14 Sex: male Admission Date (Current Location): 05/20/2016  [facility] and [place] Number:  Herbalist and Address:  [facility],  [place] [address] [place]      Provider Number: [id number]  Attending Physician Name and Address:  Murlean Iba, MD  Relative Name and Phone Number:       Current Level of Care: Hospital Recommended Level of Care: Long Prior Approval Number:    Date Approved/Denied:   PASRR Number:    Discharge Plan: SNF    Current Diagnoses: Patient Active Problem List   Diagnosis Date Noted  . Malnutrition of moderate degree 05/22/2016  . Acute delirium 05/22/2016  . Inguinal hernia 05/20/2016  . Bilateral inguinal hernia without obstruction or gangrene 05/20/2016  . LLQ abdominal pain 05/20/2016  . Constipation 05/20/2016  . Anemia, chronic disease 05/20/2016  . Generalized weakness 05/20/2016  . CKD (chronic kidney disease) stage 3, GFR 30-59 ml/min 05/20/2016  . Chronic anticoagulation 05/20/2016  . Elevated serum creatinine   . SOB (shortness of breath)   . Essential hypertension   . Type 2 diabetes mellitus with complication ([place])   . COPD exacerbation ([place]) 12/31/2015  . OA (osteoarthritis) 12/02/2015  . ESR raised 07/22/2014  . Anemia 02/18/2014  . Weight loss 12/11/2013  . LV dysfunction 03/02/2012  . Atrial Fibrillation 02/22/2012  . Vitamin B12 deficiency 09/29/2011  . COPD with bronchitis 09/29/2011  . History of gastroesophageal reflux (GERD) 09/29/2011  . Diabetes mellitus type 2, diet-controlled ([place])   . Other B-complex deficiencies 05/24/2011  . Other general symptoms  05/17/2011  . NASH (nonalcoholic steatohepatitis) 05/17/2011  . Cirrhosis of liver not due to alcohol  05/17/2011  . Personal history of colonic polyps 05/17/2011  . CAD (coronary  artery disease) 03/17/2011  . Syncope and collapse   . Dizziness   . Diaphoresis   . Chest pain   . Chest tightness   . Palpitations   . COPD (chronic obstructive pulmonary disease) ([place])   . Hypertension   . GERD (gastroesophageal reflux disease)   . BPH (benign prostatic hypertrophy)   . HYPERLIPIDEMIA 11/19/2007  . OBESITY 11/19/2007  . HYPERTENSION 11/19/2007  . INTERNAL HEMORRHOIDS 11/19/2007  . GERD 11/19/2007  . DIVERTICULOSIS, COLON 11/19/2007  . BILIARY CIRRHOSIS, PRIMARY 11/19/2007  . BENIGN PROSTATIC HYPERTROPHY, HX OF 11/19/2007  . COLONIC POLYPS, ADENOMATOUS 07/08/2002    Orientation RESPIRATION BLADDER Height & Weight     Self  Normal Incontinent Weight: 120 lb (54.4 kg) Height:  _0  (165.1 cm)  BEHAVIORAL SYMPTOMS/MOOD NEUROLOGICAL BOWEL NUTRITION STATUS      Incontinent Diet (Healthy: Thin Liquids)  AMBULATORY STATUS COMMUNICATION OF NEEDS Skin   Extensive Assist Verbally Normal                       Personal Care Assistance Level of Assistance  Bathing, Dressing, Feeding Bathing Assistance: Maximum assistance Feeding assistance: Limited assistance Dressing Assistance: Limited assistance     Functional Limitations Info  Sight, Hearing, Speech Sight Info: Adequate Hearing Info: Adequate Speech Info: Adequate    SPECIAL CARE FACTORS FREQUENCY  PT (By licensed PT), OT (By licensed OT), Speech therapy     PT Frequency: 5 OT Frequency: 5     Speech Therapy Frequency: min 1 x/week       Contractures Contractures Info:  Not present    Additional Factors Info  Code Status, Allergies, Insulin Sliding Scale Code Status Info: DNR Allergies Info: Ciprofloxacin Hcl, Penicillins   Insulin Sliding Scale Info: insulin aspart (novoLOG) injection 0-9 Units       Current Medications (05/24/2016):  This is the current hospital active medication list Current Facility-Administered Medications  Medication Dose Route Frequency Provider Last Rate Last  Dose  . acetaminophen (TYLENOL) tablet 650 mg  650 mg Oral Q6H PRN Clanford Marisa Hua, MD   650 mg at 05/21/16 1743   Or  . acetaminophen (TYLENOL) suppository 650 mg  650 mg Rectal Q6H PRN Clanford Marisa Hua, MD      . antiseptic oral rinse (CPC / CETYLPYRIDINIUM CHLORIDE 0.05%) solution 7 mL  7 mL Mouth Rinse BID Clanford L Johnson, MD   7 mL at 05/24/16 1000  . bisacodyl (DULCOLAX) suppository 10 mg  10 mg Rectal Daily PRN Clanford Marisa Hua, MD      . donepezil (ARICEPT) tablet 5 mg  5 mg Oral QHS Clanford Marisa Hua, MD   5 mg at 05/23/16 2219  . doxazosin (CARDURA) tablet 2 mg  2 mg Oral QHS Clanford Marisa Hua, MD   2 mg at 05/23/16 2219  . feeding supplement (ENSURE ENLIVE) (ENSURE ENLIVE) liquid 237 mL  237 mL Oral BID BM Clanford Marisa Hua, MD   237 mL at 05/23/16 1648  . finasteride (PROSCAR) tablet 5 mg  5 mg Oral Daily Clanford Marisa Hua, MD   5 mg at 05/24/16 1126  . fluticasone furoate-vilanterol (BREO ELLIPTA) 200-25 MCG/INH 1 puff  1 puff Inhalation Daily Clanford Marisa Hua, MD   1 puff at 05/24/16 0802  . hydrALAZINE (APRESOLINE) injection 10 mg  10 mg Intravenous Q4H PRN Clanford L Johnson, MD      . HYDROmorphone (DILAUDID) injection 0.25 mg  0.25 mg Intravenous Q3H PRN Clanford L Johnson, MD      . insulin aspart (novoLOG) injection 0-9 Units  0-9 Units Subcutaneous TID WC Clanford Marisa Hua, MD   1 Units at 05/23/16 1652  . ipratropium-albuterol (DUONEB) 0.5-2.5 (3) MG/3ML nebulizer solution 3 mL  3 mL Nebulization Q6H PRN Clanford L Johnson, MD      . isosorbide mononitrate (IMDUR) 24 hr tablet 30 mg  30 mg Oral Daily Clanford Marisa Hua, MD   30 mg at 05/24/16 1126  . LORazepam (ATIVAN) injection 0.5 mg  0.5 mg Intravenous Q6H PRN Clanford L Johnson, MD      . ondansetron (ZOFRAN) injection 4 mg  4 mg Intravenous Q6H PRN Harvel Quale, MD   4 mg at 05/20/16 1622  . pantoprazole (PROTONIX) EC tablet 40 mg  40 mg Oral Daily Clanford Marisa Hua, MD   40 mg at 05/24/16 1126  .  polyethylene glycol (MIRALAX / GLYCOLAX) packet 17 g  17 g Oral Daily Clanford Marisa Hua, MD   17 g at 05/24/16 1126  . senna-docusate (Senokot-S) tablet 1 tablet  1 tablet Oral BID Clanford Marisa Hua, MD   1 tablet at 05/24/16 1125  . simvastatin (ZOCOR) tablet 20 mg  20 mg Oral QHS Clanford Marisa Hua, MD   20 mg at 05/23/16 2219     Discharge Medications: Please see discharge summary for a list of discharge medications.  Relevant Imaging Results:  Relevant Lab Results:   Additional Information [id number]  Lia Hopping, LCSW

## 2016-05-24 NOTE — Progress Notes (Signed)
Occupational Therapy Evaluation Patient Details Name: Cory Lawrence MRN: [id number] DOB: [birth year] Today's Date: 05/24/2016    History of Present Illness 82 yo male admitted with LLQ pain due to hernia. hx of COPD, HTN, DM, CAD, PAF, arthritis.    Clinical Impression   Patient presents to OT needing extensive assistance with ADLs due to the deficits listed below. He will benefit from skilled OT to maximize function and to facilitate a safe discharge. OT will follow.    Follow Up Recommendations  SNF;Supervision/Assistance - 24 hour    Equipment Recommendations  Other (comment) (TBD next venue of care)    Recommendations for Other Services       Precautions / Restrictions Precautions Precautions: Fall Restrictions Weight Bearing Restrictions: No      Mobility Bed Mobility                  Transfers                      Balance                                            ADL Overall ADL's : Needs assistance/impaired Eating/Feeding: Total assistance;With caregiver independent assisting;Bed level   Grooming: Total assistance;With caregiver independent assisting;Bed level   Upper Body Bathing: Total assistance;Bed level   Lower Body Bathing: Total assistance;Bed level   Upper Body Dressing : Total assistance;Bed level   Lower Body Dressing: Total assistance;Bed level       Toileting- Clothing Manipulation and Hygiene: Total assistance Toileting - Clothing Manipulation Details (indicate cue type and reason): has foley       General ADL Comments: Patient with significant BUE tremors affecting ability to self-feed and groom. May benefit from AE for self-feeding.     Vision     Perception     Praxis      Pertinent Vitals/Pain Pain Assessment: No/denies pain     Hand Dominance     Extremity/Trunk Assessment Upper Extremity Assessment Upper Extremity Assessment: Generalized weakness;RUE deficits/detail;LUE  deficits/detail RUE Deficits / Details: AAROM WFL; significant tremors noted LUE Deficits / Details: AAROM WFL; significant tremors noted   Lower Extremity Assessment Lower Extremity Assessment: Defer to PT evaluation       Communication Communication Communication: HOH;Other (comment) (difficulty to assess due to poor arousal)   Cognition Arousal/Alertness: Lethargic;Suspect due to medications Behavior During Therapy: Flat affect Overall Cognitive Status: Impaired/Different from baseline Area of Impairment: Orientation;Memory;Following commands;Problem solving Orientation Level: Disoriented to;Situation;Time;Place   Memory: Decreased recall of precautions;Decreased short-term memory Following Commands: Follows one step commands inconsistently     Problem Solving: Requires tactile cues;Requires verbal cues;Slow processing     General Comments       Exercises       Shoulder Instructions      Home Living Family/patient expects to be discharged to:: Unsure Living Arrangements: Spouse/significant other Available Help at Discharge: Family Type of Home: House Home Access: Stairs to enter [employer] of Steps: 4 Entrance Stairs-Rails: Right;Left Home Layout: One level               Home Equipment: Environmental consultant - 2 wheels;Cane - single point   Additional Comments: information taken from PT evaluation; pt unable to state      Prior Functioning/Environment Level of Independence: Independent with assistive device(s)  Comments: uses cane PRN (information taken from PT evaluation -- pt unable to state)    OT Diagnosis: Generalized weakness;Altered mental status;Cognitive deficits   OT Problem List: Decreased strength;Decreased range of motion;Impaired balance (sitting and/or standing);Decreased cognition;Decreased safety awareness;Decreased knowledge of use of DME or AE;Decreased knowledge of precautions   OT Treatment/Interventions: Self-care/ADL  training;DME and/or AE instruction;Therapeutic activities;Patient/family education    OT Goals(Current goals can be found in the care plan section) Acute Rehab OT Goals Patient Stated Goal: none stated OT Goal Formulation: Patient unable to participate in goal setting Time For Goal Achievement: 06/07/16 Potential to Achieve Goals: Fair ADL Goals Pt Will Perform Eating: with min assist;with adaptive utensils;bed level;sitting Pt Will Perform Grooming: with min assist;with adaptive equipment;sitting;bed level Pt Will Transfer to Toilet: with supervision;bedside commode  OT Frequency: Min 2X/week   Barriers to D/C:            Co-evaluation              End of Session    Activity Tolerance: Patient limited by lethargy Patient left: in bed;with call bell/phone within reach   Time: 1021-1036 OT Time Calculation (min): 15 min Charges:  OT General Charges $OT Visit: 1 Procedure OT Evaluation $OT Eval Moderate Complexity: 1 Procedure G-Codes: OT G-codes **NOT FOR INPATIENT CLASS** Functional Assessment Tool Used: clinical judgment Functional Limitation: Self care Self Care Current Status [id number]): At least 60 percent but less than 80 percent impaired, limited or restricted Self Care Goal Status [id number]): At least 20 percent but less than 40 percent impaired, limited or restricted  Micaela Stith A 05/24/2016, 11:28 AM

## 2016-05-24 NOTE — Consult Note (Signed)
Consultation Note Date: 05/24/2016   Patient Name: Cory Lawrence  DOB: 06/04/1925  MRN: [id number]  Age / Sex: 82 y.o., male  PCP: [name], MD Referring Physician: Murlean Iba, MD  Reason for Consultation: Establishing goals of care, Hospice Evaluation and Psychosocial/spiritual support  HPI/Patient Profile: 82 y.o. male  with past medical history of dementia, bilateral inguinal hernias, COPD, CKD, Afib, NASH, Anemia, CAD, mixed heart failure and DM2, who was admitted on 05/20/2016 with abdominal pain secondary to bilateral inguinal hernias with constipation.  He has been evaluated by surgery who feel that the hernias could be repaired. He was evaluated by Cardiology who felt that he is at an increased risk for a major adverse cardia event from surgery, but they did not advise against surgery.     Unfortunately over the last day or two he has developed acute on chronic kidney failure and currently has a GFR of 14.    Clinical Assessment and Goals of Care: I had a lengthy discussion with Mrs. Califano.  The patient has lives at home with his wife.  He has dementia (possibly Frontotemporal dementia) that has become moderate to severe.  His wife has a bad back and bad knees she is unable to care for him at home any longer.   The patient has 2 sons.  One is incarcerated (for life), and the other has not seen his father in 30+ years.  Mrs. Crowder has no support in caring for her husband.  The Boesel's have living wills.  They indicate no life support - rather comfort care.  No artificial feeding.  When Mr. Warhurst kidneys started to fail years ago, hemodialysis was discussed.  He has always maintained that he does not want hemodialysis.  Mrs. Warga and I discussed various scenarios (1) surgery and SNF rehab with palliative (2) no surgery and SNF rehab with palliative, or (3) his kidneys continue  to decline and he becomes appropriate for residential hospice.    She will consider these with her husband over the next day or two while his kidneys either improve or not.    The League's live near [place] and Mrs. Adcock would be very comfortable sending her husband there for care if they have a place for him.  NEXT OF KIN:  Wife is the primary decision maker and the patient has dementia.    SUMMARY OF RECOMMENDATIONS    DNR / DNI  No hemodialysis, No PEG, No life support - Living Will indicates he would want to be made comfortable.  No hernia repair surgery decision yet.  Need his kidneys to improve before a decision can be made  Will likely D/C to SNF vs Residential Hospice - depending on his medical evolution (kidney function)  PMT will follow Mr. And Mrs. Kaczmarek to support and help with decision making as he progresses.    Code Status/Advance Care Planning:  DNR    Symptom Management:   Per primary team  Palliative Prophylaxis:   Delirium Protocol  Additional  Recommendations (Limitations, Scope, Preferences):  Full Scope Treatment - will re-evaluate day by day.  Psycho-social/Spiritual:   Desire for further Chaplaincy support:yes   Prognosis:   < 6 months  Discharge Planning: To Be Determined      Primary Diagnoses: Present on Admission: . Bilateral inguinal hernia without obstruction or gangrene . LLQ abdominal pain . Atrial Fibrillation . COPD with bronchitis . Cirrhosis of liver not due to alcohol  . NASH (nonalcoholic steatohepatitis) . CAD (coronary artery disease) . Hypertension . Constipation . Anemia, chronic disease . Weight loss . CKD (chronic kidney disease) stage 3, GFR 30-59 ml/min . Acute renal failure (ARF) ([place])   I have reviewed the medical record, interviewed the patient and family, and examined the patient. The following aspects are pertinent.  Past Medical History:  Diagnosis Date  . Anxiety   . Arthritis   .  Biliary cirrhosis ([place])   . BPH (benign prostatic hypertrophy)   . CAD (coronary artery disease)    prior abnormal Myoview; subsequent cath; managed medically  . Chronic anticoagulation   . COPD (chronic obstructive pulmonary disease) ([place])   . Diabetes mellitus type 2, diet-controlled ([place])    managed with diet  . Diaphoresis   . Diverticulosis of colon (without mention of hemorrhage)   . Dizziness   . GERD (gastroesophageal reflux disease)   . Heart attack ([place])   . Hyperlipidemia   . Hypertension   . Internal hemorrhoids without mention of complication   . NASH (nonalcoholic steatohepatitis)   . PAF (paroxysmal atrial fibrillation) ([facility])    on Xarelto  . Palpitations   . Personal history of colonic polyps 07/08/2002,06/2011   tubular adenoma, hyperplastic  . Ulcer    Gastric/bleeding  . Unspecified gastritis and gastroduodenitis without mention of hemorrhage   . Vitamin B12 deficiency    Social History   Social History  . Marital status: Married    Spouse name: N/A  . Number of children: 2  . Years of education: N/A   Occupational History  . retired    Social History Main Topics  . Smoking status: Former Research scientist (life sciences)  . Smokeless tobacco: Former Systems developer    Quit date: 05/31/1996  . Alcohol use No  . Drug use: No  . Sexual activity: No   Other Topics Concern  . None   Social History Narrative  . None   Family History  Problem Relation Age of Onset  . Heart failure Mother   . Diabetes Mother   . Diabetes Sister   . Heart failure Sister   . Colon cancer Neg Hx   . Cancer Mother     unknown type   Scheduled Meds: . antiseptic oral rinse  7 mL Mouth Rinse BID  . donepezil  5 mg Oral QHS  . doxazosin  2 mg Oral QHS  . feeding supplement (ENSURE ENLIVE)  237 mL Oral BID BM  . finasteride  5 mg Oral Daily  . fluticasone furoate-vilanterol  1 puff Inhalation Daily  . insulin aspart  0-9 Units Subcutaneous TID WC  . isosorbide mononitrate  30 mg Oral Daily  .  pantoprazole  40 mg Oral Daily  . polyethylene glycol  17 g Oral Daily  . senna-docusate  1 tablet Oral BID  . simvastatin  20 mg Oral QHS   Continuous Infusions: . sodium chloride     PRN Meds:.acetaminophen **OR** acetaminophen, bisacodyl, hydrALAZINE, HYDROmorphone (DILAUDID) injection, ipratropium-albuterol, LORazepam, ondansetron (ZOFRAN) IV Medications Prior to Admission:  Prior to  Admission medications   Medication Sig Start Date End Date Taking? Authorizing Provider  ALPRAZolam (XANAX) 0.5 MG tablet TAKE 1/2 TABLET BY MOUTH AT BEDTIME MAY TAKE ADDITIONAL 1/2TABLET BY MOUTH AS NEEDED IF CANNOT SLEEP Patient taking differently: TAKE 0.25 MG BY MOUTH AT BEDTIME MAY TAKE ADDITIONAL 0.25 MG BY MOUTH AS NEEDED IF CANNOT SLEEP 02/02/16  Yes [name], MD  cyanocobalamin (,VITAMIN B-12,) 1000 MCG/ML injection INJECT 1 ML (1,000 MCG TOTAL) INTO THE MUSCLE EVERY 30 (THIRTY) DAYS. 02/25/14  Yes Inda Castle, MD  diclofenac sodium (VOLTAREN) 1 % GEL APPLY 2 GRAMS TOPICALLY 3 TIMES DAILY Patient taking differently: APPLY 2 GRAMS TOPICALLY 3 TIMES DAILY PRN FOR PAIN 12/08/15  Yes [name], MD  digoxin (LANOXIN) 0.125 MG tablet TAKE 1 TABLET BY MOUTH DAILY Patient taking differently: TAKE 0.125 MG BY MOUTH DAILY 03/19/16  Yes Ezekiel Slocumb, PA-C  donepezil (ARICEPT) 5 MG tablet Take 1 tablet (5 mg total) by mouth at bedtime. 05/05/16  Yes [name], MD  doxazosin (CARDURA) 4 MG tablet Take 1 tablet (4 mg total) by mouth at bedtime. Patient taking differently: Take 2 mg by mouth at bedtime.  12/10/15  Yes [name], MD  Fe-Succ Ac-C-Thre Ac-B12-FA (FERREX 150 FORTE PLUS) 50-100 MG CAPS Take 1 capsule by mouth daily. 11/19/12  Yes Sable Feil, MD  finasteride (PROSCAR) 5 MG tablet Take 1 tablet (5 mg total) by mouth daily. 10/07/14  Yes [name], MD  Fluticasone-Salmeterol (ADVAIR DISKUS) 250-50 MCG/DOSE AEPB Inhale 1 puff into the lungs as needed. Shortness of breath. Patient  taking differently: Inhale 1 puff into the lungs at bedtime. Shortness of breath. 09/10/15  Yes [name], MD  ipratropium-albuterol (DUONEB) 0.5-2.5 (3) MG/3ML SOLN Take 3 mLs by nebulization every 6 (six) hours as needed. Patient taking differently: Take 3 mLs by nebulization every 6 (six) hours as needed (sob and wheezing).  01/07/16  Yes [name], MD  isosorbide mononitrate (IMDUR) 30 MG 24 hr tablet TAKE 1 TABLET (30 MG TOTAL) BY MOUTH DAILY. 01/09/16  Yes [name], MD  Melatonin 3 MG CAPS Take 1.5 mg by mouth at bedtime as needed (sleep). Sleep    Yes Historical Provider, MD  predniSONE (DELTASONE) 1 MG tablet TAKE 4 TABLETS DAILY FOR DOSAGE OF 4 MG A DAY Patient taking differently: TAKE 5 MG BY MOUTH DAILY 02/16/16  Yes [name], MD  simvastatin (ZOCOR) 40 MG tablet TAKE 1 TABLET BY MOUTH EVERY DAY AT BEDTIME Patient taking differently: TAKE 0.5 TABLET BY MOUTH EVERY DAY AT BEDTIME 01/16/16  Yes [name], MD  triamcinolone ointment (KENALOG) 0.1 % APPLY TO AFFECTED AREA TWICE A DAY 12/08/15  Yes [name], MD  XARELTO 15 MG TABS tablet TAKE 1 TABLET BY MOUTH EVERY DAY Patient taking differently: TAKE 15 MG BY MOUTH DAILY 01/09/16  Yes [name], MD  doxycycline (VIBRA-TABS) 100 MG tablet Take 1 tablet (100 mg total) by mouth 2 (two) times daily. Patient not taking: Reported on 05/20/2016 05/05/16   [name], MD  Ipratropium-Albuterol (COMBIVENT) 20-100 MCG/ACT AERS respimat Inhale 1 puff into the lungs every 6 (six) hours as needed for wheezing. Patient not taking: Reported on 05/16/2016 09/10/15   [name], MD  pantoprazole (PROTONIX) 40 MG tablet TAKE 1 TABLET (40 MG TOTAL) BY MOUTH DAILY. Patient not taking: Reported on 05/16/2016 04/01/15   Inda Castle, MD  tamsulosin (  FLOMAX) 0.4 MG CAPS capsule Take 1 capsule (0.4 mg total) by mouth daily after breakfast. Patient not taking: Reported on 05/16/2016 10/07/14   [name], MD  ursodiol (ACTIGALL) 250 MG  tablet TAKE 1 TABLET BY MOUTH TWICE A DAY Patient not taking: Reported on 05/16/2016 05/06/16   [name], MD   Allergies  Allergen Reactions  . Ciprofloxacin Hcl Swelling  . Penicillins Hives    Has patient had a PCN reaction causing immediate rash, facial/tongue/throat swelling, SOB or lightheadedness with hypotension: YES Has patient had a PCN reaction causing severe rash involving mucus membranes or skin necrosis: NO Has patient had a PCN reaction that required hospitalization NO Has patient had a PCN reaction occurring within the last 10 years: NO If all of the above answers are "NO", then may proceed with Cephalosporin use.   Review of Systems:  Patient unable   Physical Exam  Thin frail man.  Confused.  NAD.  Wife at bedside Resp:  NAD CV:  Irreg Abd:  Soft, slight tender in the LLQ Ext:  Able to move all 4, no edema  Vital Signs: BP (!) 140/52 (BP Location: Right Arm)   Pulse 61   Temp 97.7 F (36.5 C) (Oral)   Resp 18   Ht 5\' 5"  (1.651 m)   Wt 54.4 kg (120 lb)   SpO2 97%   BMI 19.97 kg/m  Pain Assessment: No/denies pain   Pain Score: 0-No pain   SpO2: SpO2: 97 % O2 Device:SpO2: 97 % O2 Flow Rate: .   IO: Intake/output summary:  Intake/Output Summary (Last 24 hours) at 05/24/16 1505 Last data filed at 05/24/16 1300  Gross per 24 hour  Intake           703.75 ml  Output             1350 ml  Net          -646.25 ml    LBM: Last BM Date: 05/22/16 Baseline Weight: Weight: 54.4 kg (120 lb) Most recent weight: Weight: 54.4 kg (120 lb)     Palliative Assessment/Data:   Flowsheet Rows   Flowsheet Row Most Recent Value  Intake Tab  Referral Department  Hospitalist  Unit at Time of Referral  Med/Surg Unit  Palliative Care Primary Diagnosis  Other (Comment)  Date Notified  05/23/16  Palliative Care Type  New Palliative care  Reason for referral  Clarify Goals of Care  Date of Admission  05/20/16  Date first seen by Palliative Care  05/24/16  # of days  Palliative referral response time  1 Day(s)  # of days IP prior to Palliative referral  3  Clinical Assessment  Palliative Performance Scale Score  30%  Psychosocial & Spiritual Assessment  Palliative Care Outcomes  Patient/Family meeting held?  Yes  Who was at the meeting?  patient and wife initially, then just the wife.  Palliative Care Outcomes  Clarified goals of care  Patient/Family wishes: Interventions discontinued/not started   Hemodialysis, PEG      Time In: 1:00 Time Out: 2:30 Time Total: 90 min. Greater than 50%  of this time was spent counseling and coordinating care related to the above assessment and plan.  Signed by: Imogene Burn, PA-C Palliative Medicine Pager: [phone]   Please contact Palliative Medicine Team phone at [phone] for questions and concerns.  For individual provider: See Shea Evans

## 2016-05-24 NOTE — Clinical Social Work Note (Addendum)
Clinical Social Work Assessment  Patient Details  Name: Cory Lawrence MRN: [id number] Date of Birth: 1924-11-20  Date of referral:  05/24/16               Reason for consult:  Facility Placement                Permission sought to share information with:  Case Manager, Facility Sport and exercise psychologist, Family Supports Permission granted to share information::  Yes, Verbal Permission Granted  Name::        Agency::     Relationship::  Wife  Contact Information:    [phone]  Housing/Transportation Living arrangements for the past 2 months:  Single Family Home Source of Information:  Spouse Patient Interpreter Needed:  None Criminal Activity/Legal Involvement Pertinent to Current Situation/Hospitalization:  No - Comment as needed Significant Relationships:  Spouse Lives with:  Spouse Do you feel safe going back to the place where you live?  No Need for family participation in patient care:  Yes (Comment)  Care giving concerns: Patient wife reports patient has been weak and unable to leave the bed. Patient expressed she cannot care for him at home alone and needs help.  There children and family live out of town. Patient wife reports she wants a facility close to her resident so she can continue supporting patient.    Social Worker assessment / plan: Duirng the assessment patient alert to self, and allowed wife to talk. During assessment patient wife expressed he needs support and higher of care from SNF. Patient has difficulty swallowing and needs assistance with feeding, and dressing. Patient has difficulty ambulating on his own without assistance. Patient wife cannot provide his care alone. Patient wife reports she has pain in her back and limits her ability to support him.   Employment status:  Retired Forensic scientist:  [employer] PT Recommendations:  [place] / Referral to community resources:  [place]  Patient/Family's  Response to care: Agreeable  Patient/Family's Understanding of and Emotional Response to Diagnosis, Current Treatment, and Prognosis:  Patient and his wife are agreeable to SNF at this time and understand patient current treatment should continue at [facility].   Emotional Assessment Appearance:  Appears stated age Attitude/Demeanor/Rapport:   (Cooperative) Affect (typically observed):  Accepting, Quiet Orientation:  Oriented to Self Alcohol / Substance use:  Not Applicable Psych involvement (Current and /or in the community):  No (Comment)  Discharge Needs  Concerns to be addressed:  No discharge needs identified Readmission within the last 30 days:  Yes Current discharge risk:  None Barriers to Discharge:  No Barriers Identified   Lia Hopping, LCSW 05/24/2016, 12:33 PM

## 2016-05-25 DIAGNOSIS — K4000 Bilateral inguinal hernia, with obstruction, without gangrene, not specified as recurrent: Secondary | ICD-10-CM

## 2016-05-25 DIAGNOSIS — N289 Disorder of kidney and ureter, unspecified: Secondary | ICD-10-CM

## 2016-05-25 DIAGNOSIS — N189 Chronic kidney disease, unspecified: Secondary | ICD-10-CM

## 2016-05-25 DIAGNOSIS — E440 Moderate protein-calorie malnutrition: Secondary | ICD-10-CM

## 2016-05-25 DIAGNOSIS — Z01810 Encounter for preprocedural cardiovascular examination: Secondary | ICD-10-CM

## 2016-05-25 DIAGNOSIS — K7581 Nonalcoholic steatohepatitis (NASH): Secondary | ICD-10-CM

## 2016-05-25 DIAGNOSIS — F0390 Unspecified dementia without behavioral disturbance: Secondary | ICD-10-CM

## 2016-05-25 DIAGNOSIS — Z515 Encounter for palliative care: Secondary | ICD-10-CM

## 2016-05-25 DIAGNOSIS — N183 Chronic kidney disease, stage 3 (moderate): Secondary | ICD-10-CM

## 2016-05-25 DIAGNOSIS — Z7189 Other specified counseling: Secondary | ICD-10-CM

## 2016-05-25 LAB — CBC WITH DIFFERENTIAL/PLATELET
BASOS PCT: 0 %
Basophils Absolute: 0 10*3/uL (ref 0.0–0.1)
Eosinophils Absolute: 0.1 10*3/uL (ref 0.0–0.7)
Eosinophils Relative: 2 %
HEMATOCRIT: 27.4 % — AB (ref 39.0–52.0)
Hemoglobin: 9 g/dL — ABNORMAL LOW (ref 13.0–17.0)
Lymphocytes Relative: 15 %
Lymphs Abs: 1 10*3/uL (ref 0.7–4.0)
MCH: 30.2 pg (ref 26.0–34.0)
MCHC: 32.8 g/dL (ref 30.0–36.0)
MCV: 91.9 fL (ref 78.0–100.0)
MONO ABS: 0.4 10*3/uL (ref 0.1–1.0)
MONOS PCT: 6 %
NEUTROS ABS: 5 10*3/uL (ref 1.7–7.7)
Neutrophils Relative %: 76 %
Platelets: 100 10*3/uL — ABNORMAL LOW (ref 150–400)
RBC: 2.98 MIL/uL — ABNORMAL LOW (ref 4.22–5.81)
RDW: 14.8 % (ref 11.5–15.5)
WBC: 6.5 10*3/uL (ref 4.0–10.5)

## 2016-05-25 LAB — DIGOXIN LEVEL: DIGOXIN LVL: 1 ng/mL (ref 0.8–2.0)

## 2016-05-25 LAB — COMPREHENSIVE METABOLIC PANEL
ALBUMIN: 2.5 g/dL — AB (ref 3.5–5.0)
ALT: 12 U/L — ABNORMAL LOW (ref 17–63)
ANION GAP: 5 (ref 5–15)
AST: 14 U/L — ABNORMAL LOW (ref 15–41)
Alkaline Phosphatase: 71 U/L (ref 38–126)
BILIRUBIN TOTAL: 0.7 mg/dL (ref 0.3–1.2)
BUN: 55 mg/dL — ABNORMAL HIGH (ref 6–20)
CALCIUM: 7.5 mg/dL — AB (ref 8.9–10.3)
CO2: 24 mmol/L (ref 22–32)
Chloride: 110 mmol/L (ref 101–111)
Creatinine, Ser: 2.81 mg/dL — ABNORMAL HIGH (ref 0.61–1.24)
GFR calc Af Amer: 21 mL/min — ABNORMAL LOW (ref >60)
GFR, EST NON AFRICAN AMERICAN: 19 mL/min — AB (ref >60)
Glucose, Bld: 100 mg/dL — ABNORMAL HIGH (ref 65–99)
POTASSIUM: 4 mmol/L (ref 3.5–5.1)
Sodium: 139 mmol/L (ref 135–145)
Total Protein: 5.4 g/dL — ABNORMAL LOW (ref 6.5–8.1)

## 2016-05-25 LAB — GLUCOSE, CAPILLARY
GLUCOSE-CAPILLARY: 165 mg/dL — AB (ref 65–99)
GLUCOSE-CAPILLARY: 137 mg/dL — AB (ref 65–99)
GLUCOSE-CAPILLARY: 87 mg/dL (ref 65–99)
Glucose-Capillary: 136 mg/dL — ABNORMAL HIGH (ref 65–99)

## 2016-05-25 LAB — PROTIME-INR
INR: 1.32
PROTHROMBIN TIME: 16.5 s — AB (ref 11.4–15.2)

## 2016-05-25 LAB — ABO/RH: ABO/RH(D): O NEG

## 2016-05-25 MED ORDER — HYDROCORTISONE NA SUCCINATE PF 100 MG IJ SOLR
50.0000 mg | Freq: Every day | INTRAMUSCULAR | Status: DC
  Administered 2016-05-25 – 2016-06-02 (×8): 50 mg via INTRAVENOUS
  Filled 2016-05-25: qty 2
  Filled 2016-05-25 (×2): qty 1
  Filled 2016-05-25 (×6): qty 2

## 2016-05-25 MED ORDER — URSODIOL 250 MG PO TABS
250.0000 mg | ORAL_TABLET | Freq: Two times a day (BID) | ORAL | Status: DC
  Administered 2016-05-26 – 2016-06-02 (×15): 250 mg via ORAL
  Filled 2016-05-25: qty 1

## 2016-05-25 MED ORDER — METOPROLOL TARTRATE 5 MG/5ML IV SOLN: 5.0000 mg | INTRAVENOUS | Status: DC | PRN

## 2016-05-25 MED ORDER — SODIUM CHLORIDE 0.9 % IV SOLN
INTRAVENOUS | Status: DC
  Administered 2016-05-25 (×2): via INTRAVENOUS

## 2016-05-25 MED ORDER — HEPARIN SODIUM (PORCINE) 5000 UNIT/ML IJ SOLN
5000.0000 [IU] | Freq: Three times a day (TID) | INTRAMUSCULAR | Status: DC
  Administered 2016-05-25 – 2016-05-26 (×3): 5000 [IU] via SUBCUTANEOUS
  Filled 2016-05-25 (×3): qty 1

## 2016-05-25 NOTE — Progress Notes (Signed)
Physical Therapy Treatment Patient Details Name: Cory Lawrence MRN: [id number] DOB: April 13, 1925 Today's Date: 05/25/2016    History of Present Illness 82 yo male admitted with LLQ pain due to hernia. hx of COPD, HTN, DM, CAD, PAF, arthritis.     PT Comments    Assisted OOB to [facility] then amb in hallway using B platform EVA walker for increased support and to increase amb distance.  Pt lives with spouse but needs ST Rehab at [facility] before returning.  Follow Up Recommendations  SNF     Equipment Recommendations       Recommendations for Other Services       Precautions / Restrictions      Mobility  Bed Mobility   Bed Mobility: Supine to Sit     Supine to sit: Min assist;Mod assist     General bed mobility comments: assist with increased time.  Used bed pad to assist with scooting.  Transfers Overall transfer level: Needs assistance Equipment used: Rolling walker (2 wheeled) Transfers: Sit to/from Stand Sit to Stand: +2 physical assistance;Mod assist         General transfer comment: assisted from bed to [facility] + 2 assist with increased time and assist to complete 1/4 turn.     Ambulation/Gait Ambulation/Gait assistance: Min assist;Mod assist Ambulation Distance (Feet): 22 Feet Assistive device: Bilateral platform walker Gait Pattern/deviations: Step-to pattern;Step-through pattern     General Gait Details: used B platform EVA walker for increased support and to increase amb distance.  Recliner following behind for safety.   Stairs            Wheelchair Mobility    Modified Rankin (Stroke Patients Only)       Balance                                    Cognition Arousal/Alertness: Awake/alert Behavior During Therapy: WFL for tasks assessed/performed Overall Cognitive Status: Within Functional Limits for tasks assessed                      Exercises      General Comments        Pertinent Vitals/Pain Pain  Assessment: Faces Faces Pain Scale: Hurts a little bit Pain Location: ABD Pain Descriptors / Indicators: Grimacing Pain Intervention(s): Monitored during session    Home Living                      Prior Function            PT Goals (current goals can now be found in the care plan section) Progress towards PT goals: Progressing toward goals    Frequency  Min 3X/week    PT Plan Current plan remains appropriate    Co-evaluation             End of Session Equipment Utilized During Treatment: Gait belt Activity Tolerance: Patient limited by fatigue Patient left: in chair;with call bell/phone within reach;with chair alarm set     Time: [id number] PT Time Calculation (min) (ACUTE ONLY): 32 min  Charges:  $Gait Training: 8-22 mins $Therapeutic Activity: 8-22 mins                    G Codes:      Rica Koyanagi  PTA [facility]  Acute  Rehab Pager      [phone]

## 2016-05-25 NOTE — Progress Notes (Signed)
  Subjective: Awake.  Complains of left groin pain.  No nausea or vomiting. Wife is present  Objective: Vital signs in last 24 hours: Temp:  [97.5 F (36.4 C)-97.7 F (36.5 C)] 97.7 F (36.5 C) (07/26 0447) Pulse Rate:  [51-61] 59 (07/26 0447) Resp:  [17-22] 18 (07/26 0447) BP: (140-177)/(51-59) 177/59 (07/26 0447) SpO2:  [96 %-100 %] 97 % (07/26 0447) Last BM Date: 05/23/16  Intake/Output from previous day: 07/25 0701 - 07/26 0700 In: 200 [P.O.:200] Out: 1175 [Urine:1175] Intake/Output this shift: Total I/O In: -  Out: 475 [Urine:475]  General appearance: Alert.  Pleasant.  Elderly and somewhat deconditioned.  Very appropriate and cooperative. GI: soft, non-tender; bowel sounds normal; no masses,  no organomegaly Male genitalia: normal, Bilateral inguinal hernias.  Right side fairly easily reducible.  Left side tender and required some effort to reduce but I was able to reduce it and he was then pain free.  No skin problems.  Lab Results:   Recent Labs  05/23/16 0854  WBC 7.6  HGB 9.6*  HCT 28.6*  PLT 121*   BMET  Recent Labs  05/23/16 0854  NA 138  K 4.5  CL 105  CO2 24  GLUCOSE 96  BUN 64*  CREATININE 3.64*  CALCIUM 8.1*   PT/INR No results for input(s): LABPROT, INR in the last 72 hours. ABG No results for input(s): PHART, HCO3 in the last 72 hours.  Invalid input(s): PCO2, PO2  Studies/Results: No results found.  Anti-infectives: Anti-infectives    None      Assessment/Plan:  Bilateral inguinal hernias.    Despite his risk and conversations about palliative care, it is highly likely that he will continue to have pain and is at high risk for recurrent pain, recurrent incarceration or strangulation    The hernias are reducible, although it required some forceful reduction this morning.  There is no immediate surgical need.  There is no evidence of compromised bowel or obstruction at this moment.     He and his wife are ambivalent about  what to do, but are not opposed to surgery if his medical condition will allow.      We will follow until goals of care are better clarified.  Coronary artery disease Atrial fibrillation on Xarelto COPD Chronic kidney disease Chronic anemia  LOS: 2 days    Cory [facility] M 05/25/2016

## 2016-05-25 NOTE — Progress Notes (Signed)
Speech Language Pathology Treatment: Dysphagia  Patient Details Name: Cory [facility] MRN: [id number] DOB: Mar 18, 1925 Today's Date: 05/25/2016 Time: 6047-9987 SLP Time Calculation (min) (ACUTE ONLY): 21 min  Assessment / Plan / Recommendation Clinical Impression  Pt with great mental status today, sitting upright in bed and very communicative.  SLP observed him feeding himself portions of lunch including broccolli, spaghetti, and Ensure.  Mastication was adequate albeit slightly slow (pt recently lost partial at [facility] per his wife)    No indication of airway compromise or significant dysphagia noted.    Pt continually stated that he had a poor appetite and he was apprehensive about surgery tomorrow.     Recommend pt continue DYS3 diet due to his lack of adequate dentition.  No family present at this time but educated completed with spouse during eval 2 days prior.  SLP to sign off, please reorder if deemed necessary.  Thanks.    HPI HPI: pt is a 82 yo male adm to [facility] with left lower quadrant pain  and poor intake.  Pt found to have possible left lobe pna - vs ATX.  Swallow evaluation ordered due to pt's mental status changes.        SLP Plan  All goals met     Recommendations  Diet recommendations: Dysphagia 3 (mechanical soft);Thin liquid Liquids provided via: Cup Medication Administration: Whole meds with puree Supervision: Patient able to self feed (set up assist) Compensations: Slow rate;Small sips/bites;Other (Comment);Lingual sweep for clearance of pocketing (assure oral clearance ) Postural Changes and/or Swallow Maneuvers: Seated upright 90 degrees;Upright 30-60 min after meal             Oral Care Recommendations: Oral care BID Follow up Recommendations: None Plan: All goals met     Cory Lawrence, Cory [facility] SLP [phone]

## 2016-05-25 NOTE — Progress Notes (Signed)
PROGRESS NOTE                                                                                                                                                                                                             Patient Demographics:    Cory [facility], is a 82 y.o. male, DOB - Dec 18, 1924, [id number]  Admit date - 05/20/2016   Admitting Physician Murlean Iba, MD  Outpatient Primary MD for the patient is [name], [name], MD  LOS - 2  Chief Complaint  Patient presents with  . Hernia       Brief Narrative   82 year old Caucasian male with history of advanced dementia, paroxysmal atrial fibrillation [name] 2 score of at least 6, on xaralto, COPD, BPH, bilateral inguinal hernias, CAD, chronic combined systolic and diastolic CHF EF [id number], Nash, CK D stage III, DM type II, primary biliary cirrhosis who was admitted to the hospital with abdominal pain due to incarcerated hernias left more than right. He was also found to be in acute renal failure. General surgery palliative care following. Patient is DO NOT RESUSCITATE. Family is very reasonable, however in my opinion patient will have no chance of survival are good quality of life if we do not proceed with surgical correction of his incarcerated hernia. I have discussed this with general surgeon Dr. Wynonia Sours on 05/25/2016.  He shouldn't and wife clearly understand that he is a very high risk of adverse cardio pulmonary outcome and the accept this risk, they want to take the chance of undergoing surgery with at least some chance of pain-free and decent quality of life. They agree that if surgery does not go well patient might die or end up with residential hospice, for now plan was to defer surgery with residential hospice anyways.     Subjective:    Cory [facility] today has, No headache, No chest pain, No abdominal pain - No Nausea, No new weakness tingling or numbness, No  Cough - SOB.    Assessment  & Plan :     1.Bilateral incarcerated inguinal hernias left more than right. Had detailed discussion with patient and wife, along with general surgeon Dr. Wynonia Sours on 05/25/2016, plan now is to provide patient with some chance of pain-free life and proceed with surgery in the next few days. Patient and wife clearly understand  that he is a very high risk of adverse cardio pulmonary outcome during the perioperative period, he may die during the surgery however his only chance of pain-free life with any meaning of quality is undergoing surgery. Family was contemplating of not undergoing surgery and going straight to residential hospice.   2. CAD with chronic ischemic cardiomyopathy causing chronic combined systolic and diastolic heart failure EF 40%. Currently compensated. Have requested cardiology to evaluate for #1 preop eval. Continue Imdur and statin for secondary prevention.  3. Paroxysmal atrial fibrillation with [name] 2 score overtly 6. Currently in sinus bradycardia, not on any rate controlling medications except digoxin which has been held due to renal insufficiency, digoxin level is stable, xaralto currently on hold due to pending surgery. Platelets are borderline and I doubt bridging aggressively with heparin will provide any meaningful benefit.  4. Dyslipidemia. On statin.  5. [name] with primary biliary cirrhosis. Supportive care, continue Urosodiol.  6. Acute renal failure on CK D stage III. Baseline creatinine close to 1.5. Being hydrated renal function improving, will check a baseline bladder scan. Has condom catheter.  7. Advanced dementia, generalized deconditioning - I risk for delirium, continue supportive care with home dementia medications, patient and wife wish him to be DO NOT RESUSCITATE, they do not wish any unnecessary heroics. If stable will go to SNF if declines will go to residential hospice.  8. Essential hypertension. For now as needed IV  hydralazine.  9. GERD. On PPI.  10. History of COPD. Stable continue supportive care with oxygen and nebulizer treatments as needed.  11. Chronic low-dose prednisone use. Wife unsure why he takes it, for now 50 mg of IV hydrocortisone daily, if blood pressure drops will change to 100 mg 3 times a day.  12. A OCD. Type screen and monitor.    Family Communication  :  Wife  Code Status :  DNR  Diet : Heart Healthy  Disposition Plan  :  Likely SNF vs Res.Hospice  Consults  :  CCS, Pall Care  Procedures  :    TTE   Normal LV wall thickness with LVEF approximately 40-45%. Mid to basal inferolateral hypokinesis. Grade 1 diastolic dysfunction with normal estimated LV filling pressure. Mild left atrial enlargement. MAC with trivial mitral regurgitation. Mild to moderate calcific aortic stenosis. Trivial tricuspid regurgitation.   DVT Prophylaxis  :   Heparin    Lab Results  Component Value Date   PLT 100 (L) 05/25/2016    Inpatient Medications  Scheduled Meds: . antiseptic oral rinse  7 mL Mouth Rinse BID  . donepezil  5 mg Oral QHS  . doxazosin  2 mg Oral QHS  . feeding supplement (ENSURE ENLIVE)  237 mL Oral BID BM  . finasteride  5 mg Oral Daily  . fluticasone furoate-vilanterol  1 puff Inhalation Daily  . heparin subcutaneous  5,000 Units Subcutaneous Q8H  . hydrocortisone sod succinate (SOLU-CORTEF) inj  50 mg Intravenous Daily  . insulin aspart  0-9 Units Subcutaneous TID WC  . isosorbide mononitrate  30 mg Oral Daily  . pantoprazole  40 mg Oral Daily  . polyethylene glycol  17 g Oral Daily  . senna-docusate  1 tablet Oral BID  . simvastatin  20 mg Oral QHS  . ursodiol  250 mg Oral BID   Continuous Infusions: . sodium chloride     PRN Meds:.acetaminophen **OR** [DISCONTINUED] acetaminophen, bisacodyl, hydrALAZINE, HYDROmorphone (DILAUDID) injection, ipratropium-albuterol, LORazepam, metoprolol, ondansetron (ZOFRAN) IV  Antibiotics  :  Anti-infectives     None         Objective:   Vitals:   05/24/16 1411 05/24/16 2116 05/25/16 0447 05/25/16 0758  BP: (!) 140/52 (!) 169/58 (!) 177/59   Pulse: 61 (!) 58 (!) 59 65  Resp: 18 17 18 18   Temp: 97.7 F (36.5 C) 97.7 F (36.5 C) 97.7 F (36.5 C)   TempSrc: Oral Oral Oral   SpO2: 97% 97% 97% 96%  Weight:      Height:        Wt Readings from Last 3 Encounters:  05/20/16 54.4 kg (120 lb)  03/24/16 56.9 kg (125 lb 6.4 oz)  02/11/16 57.6 kg (127 lb)     Intake/Output Summary (Last 24 hours) at 05/25/16 1000 Last data filed at 05/25/16 0845  Gross per 24 hour  Intake              100 ml  Output             1650 ml  Net            -1550 ml     Physical Exam  Awake but Pleasantly confused, No new F.N deficits,   [place].AT,PERRAL Supple Neck,No JVD, No cervical lymphadenopathy appriciated.  Symmetrical Chest wall movement, Good air movement bilaterally, CTAB RRR,No Gallops,Rubs or new Murmurs, No Parasternal Heave +ve B.Sounds, Abd Soft, No tenderness, No organomegaly appriciated, No rebound - guarding or rigidity. No Cyanosis, Clubbing or edema, No new Rash or bruise  Bilat Ing hernia    Data Review:    CBC  Recent Labs Lab 05/20/16 1508 05/21/16 0550 05/23/16 0854 05/25/16 0549  WBC 8.1 6.1 7.6 6.5  HGB 9.4* 8.7* 9.6* 9.0*  HCT 28.2* 26.3* 28.6* 27.4*  PLT 171 154 121* 100*  MCV 92.5 92.6 92.3 91.9  MCH 30.8 30.6 31.0 30.2  MCHC 33.3 33.1 33.6 32.8  RDW 14.7 14.8 14.9 14.8  LYMPHSABS  --   --   --  1.0  MONOABS  --   --   --  0.4  EOSABS  --   --   --  0.1  BASOSABS  --   --   --  0.0    Chemistries   Recent Labs Lab 05/20/16 1508 05/21/16 0550 05/23/16 0854 05/25/16 0549  NA 139 138 138 139  K 4.1 4.1 4.5 4.0  CL 106 106 105 110  CO2 28 26 24 24   GLUCOSE 115* 97 96 100*  BUN 55* 55* 64* 55*  CREATININE 1.75* 2.35* 3.64* 2.81*  CALCIUM 8.5* 8.2* 8.1* 7.5*  AST  --   --   --  14*  ALT  --   --   --  12*  ALKPHOS  --   --   --  71  BILITOT  --    --   --  0.7   ------------------------------------------------------------------------------------------------------------------ No results for input(s): CHOL, HDL, LDLCALC, TRIG, CHOLHDL, LDLDIRECT in the last 72 hours.  Lab Results  Component Value Date   HGBA1C 5.3 05/21/2016   ------------------------------------------------------------------------------------------------------------------ No results for input(s): TSH, T4TOTAL, T3FREE, THYROIDAB in the last 72 hours.  Invalid input(s): FREET3 ------------------------------------------------------------------------------------------------------------------ No results for input(s): VITAMINB12, FOLATE, FERRITIN, TIBC, IRON, RETICCTPCT in the last 72 hours.  Coagulation profile No results for input(s): INR, PROTIME in the last 168 hours.  No results for input(s): DDIMER in the last 72 hours.  Cardiac Enzymes No results for input(s): CKMB, TROPONINI, MYOGLOBIN in the last 168 hours.  Invalid input(s): CK ------------------------------------------------------------------------------------------------------------------    Component Value Date/Time   BNP 116.5 (H) 12/31/2015 1720    Micro Results No results found for this or any previous visit (from the past 240 hour(s)).  Radiology Reports Dg Chest 2 View  Result Date: 05/05/2016 CLINICAL DATA:  Chest pain, left flank pain, fall 5 days ago EXAM: CHEST  2 VIEW COMPARISON:  12/31/2015 FINDINGS: Cardiomediastinal silhouette is stable. Hyperinflation again noted. Stable chronic interstitial prominence. There is left basilar streaky atelectasis or early infiltrate. No pulmonary edema. Osteopenia and degenerative changes thoracic spine. Atherosclerotic calcifications of thoracic aorta. No pneumothorax. IMPRESSION: Hyperinflation. Chronic interstitial prominence. No pulmonary edema. Streaky left basilar atelectasis or early infiltrate. No pneumothorax. Electronically Signed   By: Lahoma Crocker M.D.   On: 05/05/2016 13:47   Dg Ribs Unilateral W/chest Left  Result Date: 05/05/2016 CLINICAL DATA:  Left-sided chest pain, fell 5 days ago EXAM: LEFT RIBS AND CHEST - 3+ VIEW COMPARISON:  Chest x-ray of 12/31/2015 FINDINGS: On the images obtained, no acute left rib fracture is seen. The current images are very obliqued and standard rib detail views would be helpful. The lungs appear to be hyperaerated consistent with an element of emphysema. IMPRESSION: 1. No acute left rib fracture seen on the images obtained. Please see above. 2. Emphysema. Electronically Signed   By: Ivar Drape M.D.   On: 05/05/2016 13:47   Dg Abd Acute W/chest  Result Date: 05/20/2016 CLINICAL DATA:  Left lower abdominal pain for 4-5 days EXAM: DG ABDOMEN ACUTE W/ 1V CHEST COMPARISON:  05/16/2016 FINDINGS: There is no evidence of dilated bowel loops or free intraperitoneal air. No radiopaque calculi or other significant radiographic abnormality is seen. Heart size and mediastinal contours are within normal limits. The lungs are hyperinflated likely secondary to COPD. IMPRESSION: Negative abdominal radiographs.  No acute cardiopulmonary disease. Electronically Signed   By: Kathreen Devoid   On: 05/20/2016 15:37   Dg Abd Acute W/chest  Result Date: 05/16/2016 CLINICAL DATA:  82 year old male with history of bilateral inguinal hernias and discomfort for the past 3 days EXAM: DG ABDOMEN ACUTE W/ 1V CHEST COMPARISON:  Chest x-ray 05/05/2016. FINDINGS: Lung volumes appear slightly increased and there are mild emphysematous changes noted. Architectural distortion at the base of the left lung, similar to prior studies, favored to reflect areas of chronic scarring. Small bilateral pleural effusions. No evidence of pulmonary edema. No suspicious appearing pulmonary nodules or masses. Heart size is normal. Upper mediastinal contours are within normal limits. Aortic atherosclerosis. Gas and stool are noted throughout the colon extending to  the level of the distal rectum. No pathologic dilatation of small bowel is noted. Several nondilated gas-filled loops of small bowel are noted, predominantly in the left side of the abdomen. No pneumoperitoneum. IMPRESSION: 1. Nonobstructive bowel gas pattern. 2. No pneumoperitoneum. 3. Small bilateral pleural effusions. 4. Probable scarring the left lung base. 5. Aortic atherosclerosis. Electronically Signed   By: Vinnie Langton M.D.   On: 05/16/2016 15:32    Time Spent in minutes  30   Gunter Conde K M.D on 05/25/2016 at 10:00 AM  Between 7am to 7pm - Pager - [phone]  After 7pm go to [email or web address] - password [facility]  [facility] -  Office  [phone]

## 2016-05-25 NOTE — Consult Note (Signed)
Reason for Consult:   Pre op cardiovascular evaluation  Requesting Physician: [facility] Primary Cardiologist Dr Acie Fredrickson  HPI:   82 y/o caucasian male followed by Dr Acie Fredrickson. He has CAD with a known CTO of his RCA with L-R collaterals. He has multiple other medical issues including NASH, dementia, PAF, chronic anticoagulation with Xarelto, CRI stage 3 (SCr has been 1.2-1.4 as recently as March 2017), HTN and HLD. He recently developed bilateral inguinal hernias. He has decided to proceed with surgical repair. Echo done 05/22/16 showed a drop in his EF c/w 2013- EF 50% then, now 40-45%. The pt also has acute on chronic renal insufficiency with a SCr up to 3.6 but this has improved somewhat. He denied any recent chest pain when I interviewed him this afternoon, no family was present. His Xarelto has been on hold. Telemetry shows NSR with rate in the 60's.   PMHx:  Past Medical History:  Diagnosis Date  . Anxiety   . Arthritis   . Biliary cirrhosis ([place])   . BPH (benign prostatic hypertrophy)   . CAD (coronary artery disease)    prior abnormal Myoview; subsequent cath; managed medically  . Chronic anticoagulation   . COPD (chronic obstructive pulmonary disease) ([place])   . Diabetes mellitus type 2, diet-controlled ([place])    managed with diet  . Diaphoresis   . Diverticulosis of colon (without mention of hemorrhage)   . Dizziness   . GERD (gastroesophageal reflux disease)   . Heart attack ([place])   . Hyperlipidemia   . Hypertension   . Internal hemorrhoids without mention of complication   . NASH (nonalcoholic steatohepatitis)   . PAF (paroxysmal atrial fibrillation) (HCC)    on Xarelto  . Palpitations   . Personal history of colonic polyps 07/08/2002,06/2011   tubular adenoma, hyperplastic  . Ulcer    Gastric/bleeding  . Unspecified gastritis and gastroduodenitis without mention of hemorrhage   . Vitamin B12 deficiency     Past Surgical History:  Procedure Laterality  Date  . CARDIAC CATHETERIZATION  March 2013   Managed medically  . CATARACT EXTRACTION     bilateral  . EYE SURGERY     left eye x multiple    SOCHx:  reports that he has quit smoking. He quit smokeless tobacco use about 19 years ago. He reports that he does not drink alcohol or use drugs.  FAMHx: Family History  Problem Relation Age of Onset  . Heart failure Mother   . Diabetes Mother   . Diabetes Sister   . Heart failure Sister   . Colon cancer Neg Hx   . Cancer Mother     unknown type    ALLERGIES: Allergies  Allergen Reactions  . Ciprofloxacin Hcl Swelling  . Penicillins Hives    Has patient had a PCN reaction causing immediate rash, facial/tongue/throat swelling, SOB or lightheadedness with hypotension: YES Has patient had a PCN reaction causing severe rash involving mucus membranes or skin necrosis: NO Has patient had a PCN reaction that required hospitalization NO Has patient had a PCN reaction occurring within the last 10 years: NO If all of the above answers are "NO", then may proceed with Cephalosporin use.    ROS: Review of Systems: General: negative for chills, fever, night sweats or weight changes.  Cardiovascular: negative for chest pain, dyspnea on exertion, edema, orthopnea, palpitations, paroxysmal nocturnal dyspnea or shortness of breath HEENT: negative for any visual disturbances, blindness, glaucoma Dermatological: negative  for rash Respiratory: negative for cough, hemoptysis, or wheezing Urologic: negative for hematuria or dysuria Abdominal: negative for nausea, vomiting, diarrhea, bright red blood per rectum, melena, or hematemesis Neurologic: negative for visual changes, syncope, or dizziness Musculoskeletal: negative for back pain, joint pain, or swelling Psych: cooperative and appropriate All other systems reviewed and are otherwise negative except as noted above.   HOME MEDICATIONS: Prior to Admission medications   Medication Sig Start  Date End Date Taking? Authorizing Provider  ALPRAZolam (XANAX) 0.5 MG tablet TAKE 1/2 TABLET BY MOUTH AT BEDTIME MAY TAKE ADDITIONAL 1/2TABLET BY MOUTH AS NEEDED IF CANNOT SLEEP Patient taking differently: TAKE 0.25 MG BY MOUTH AT BEDTIME MAY TAKE ADDITIONAL 0.25 MG BY MOUTH AS NEEDED IF CANNOT SLEEP 02/02/16  Yes [name], MD  cyanocobalamin (,VITAMIN B-12,) 1000 MCG/ML injection INJECT 1 ML (1,000 MCG TOTAL) INTO THE MUSCLE EVERY 30 (THIRTY) DAYS. 02/25/14  Yes Inda Castle, MD  diclofenac sodium (VOLTAREN) 1 % GEL APPLY 2 GRAMS TOPICALLY 3 TIMES DAILY Patient taking differently: APPLY 2 GRAMS TOPICALLY 3 TIMES DAILY PRN FOR PAIN 12/08/15  Yes [name], MD  digoxin (LANOXIN) 0.125 MG tablet TAKE 1 TABLET BY MOUTH DAILY Patient taking differently: TAKE 0.125 MG BY MOUTH DAILY 03/19/16  Yes Ezekiel Slocumb, PA-C  donepezil (ARICEPT) 5 MG tablet Take 1 tablet (5 mg total) by mouth at bedtime. 05/05/16  Yes [name], MD  doxazosin (CARDURA) 4 MG tablet Take 1 tablet (4 mg total) by mouth at bedtime. Patient taking differently: Take 2 mg by mouth at bedtime.  12/10/15  Yes [name], MD  Fe-Succ Ac-C-Thre Ac-B12-FA (FERREX 150 FORTE PLUS) 50-100 MG CAPS Take 1 capsule by mouth daily. 11/19/12  Yes Sable Feil, MD  finasteride (PROSCAR) 5 MG tablet Take 1 tablet (5 mg total) by mouth daily. 10/07/14  Yes [name], MD  Fluticasone-Salmeterol (ADVAIR DISKUS) 250-50 MCG/DOSE AEPB Inhale 1 puff into the lungs as needed. Shortness of breath. Patient taking differently: Inhale 1 puff into the lungs at bedtime. Shortness of breath. 09/10/15  Yes [name], MD  ipratropium-albuterol (DUONEB) 0.5-2.5 (3) MG/3ML SOLN Take 3 mLs by nebulization every 6 (six) hours as needed. Patient taking differently: Take 3 mLs by nebulization every 6 (six) hours as needed (sob and wheezing).  01/07/16  Yes [name], MD  isosorbide mononitrate (IMDUR) 30 MG 24 hr tablet TAKE 1 TABLET (30 MG TOTAL) BY  MOUTH DAILY. 01/09/16  Yes [name], MD  Melatonin 3 MG CAPS Take 1.5 mg by mouth at bedtime as needed (sleep). Sleep    Yes Historical Provider, MD  predniSONE (DELTASONE) 1 MG tablet TAKE 4 TABLETS DAILY FOR DOSAGE OF 4 MG A DAY Patient taking differently: TAKE 5 MG BY MOUTH DAILY 02/16/16  Yes [name], MD  simvastatin (ZOCOR) 40 MG tablet TAKE 1 TABLET BY MOUTH EVERY DAY AT BEDTIME Patient taking differently: TAKE 0.5 TABLET BY MOUTH EVERY DAY AT BEDTIME 01/16/16  Yes [name], MD  triamcinolone ointment (KENALOG) 0.1 % APPLY TO AFFECTED AREA TWICE A DAY 12/08/15  Yes [name], MD  XARELTO 15 MG TABS tablet TAKE 1 TABLET BY MOUTH EVERY DAY Patient taking differently: TAKE 15 MG BY MOUTH DAILY 01/09/16  Yes [name], MD  doxycycline (VIBRA-TABS) 100 MG tablet Take 1 tablet (100 mg total) by mouth 2 (two) times daily. Patient not taking: Reported on 05/20/2016 05/05/16  [name], MD  Ipratropium-Albuterol (COMBIVENT) 20-100 MCG/ACT AERS respimat Inhale 1 puff into the lungs every 6 (six) hours as needed for wheezing. Patient not taking: Reported on 05/16/2016 09/10/15   [name], MD  pantoprazole (PROTONIX) 40 MG tablet TAKE 1 TABLET (40 MG TOTAL) BY MOUTH DAILY. Patient not taking: Reported on 05/16/2016 04/01/15   Inda Castle, MD  tamsulosin [facility]) 0.4 MG CAPS capsule Take 1 capsule (0.4 mg total) by mouth daily after breakfast. Patient not taking: Reported on 05/16/2016 10/07/14   [name], MD  ursodiol (ACTIGALL) 250 MG tablet TAKE 1 TABLET BY MOUTH TWICE A DAY Patient not taking: Reported on 05/16/2016 05/06/16   [name], MD    HOSPITAL MEDICATIONS: I have reviewed the patient's current medications.  VITALS: Blood pressure (!) 159/52, pulse (!) 58, temperature 97.5 F (36.4 C), temperature source Oral, resp. rate 20, height 5\' 5"  (1.651 m), weight 120 lb (54.4 kg), SpO2 98 %.  PHYSICAL EXAM: General appearance: alert, cooperative and no  distress Neck: no carotid bruit and no JVD Lungs: clear to auscultation bilaterally Heart: regular rate and rhythm Abdomen: not ditended, bilateral tender inguinal hernias Extremities: extremities normal, atraumatic, no cyanosis or edema Pulses: 2+ and symmetric Skin: Skin color, texture, turgor normal. No rashes or lesions Neurologic: Grossly normal  LABS: Results for orders placed or performed during the hospital encounter of 05/20/16 (from the past 24 hour(s))  Glucose, capillary     Status: Abnormal   Collection Time: 05/24/16  4:30 PM  Result Value Ref Range   Glucose-Capillary 109 (H) 65 - 99 mg/dL   Comment 1 Notify RN   Glucose, capillary     Status: Abnormal   Collection Time: 05/24/16  9:18 PM  Result Value Ref Range   Glucose-Capillary 121 (H) 65 - 99 mg/dL  CBC with Differential/Platelet     Status: Abnormal   Collection Time: 05/25/16  5:49 AM  Result Value Ref Range   WBC 6.5 4.0 - 10.5 K/uL   RBC 2.98 (L) 4.22 - 5.81 MIL/uL   Hemoglobin 9.0 (L) 13.0 - 17.0 g/dL   HCT 27.4 (L) 39.0 - 52.0 %   MCV 91.9 78.0 - 100.0 fL   MCH 30.2 26.0 - 34.0 pg   MCHC 32.8 30.0 - 36.0 g/dL   RDW 14.8 11.5 - 15.5 %   Platelets 100 (L) 150 - 400 K/uL   Neutrophils Relative % 76 %   Neutro Abs 5.0 1.7 - 7.7 K/uL   Lymphocytes Relative 15 %   Lymphs Abs 1.0 0.7 - 4.0 K/uL   Monocytes Relative 6 %   Monocytes Absolute 0.4 0.1 - 1.0 K/uL   Eosinophils Relative 2 %   Eosinophils Absolute 0.1 0.0 - 0.7 K/uL   Basophils Relative 0 %   Basophils Absolute 0.0 0.0 - 0.1 K/uL  Comprehensive metabolic panel     Status: Abnormal   Collection Time: 05/25/16  5:49 AM  Result Value Ref Range   Sodium 139 135 - 145 mmol/L   Potassium 4.0 3.5 - 5.1 mmol/L   Chloride 110 101 - 111 mmol/L   CO2 24 22 - 32 mmol/L   Glucose, Bld 100 (H) 65 - 99 mg/dL   BUN 55 (H) 6 - 20 mg/dL   Creatinine, Ser 2.81 (H) 0.61 - 1.24 mg/dL   Calcium 7.5 (L) 8.9 - 10.3 mg/dL   Total Protein 5.4 (L) 6.5 - 8.1  g/dL   Albumin 2.5 (  L) 3.5 - 5.0 g/dL   AST 14 (L) 15 - 41 U/L   ALT 12 (L) 17 - 63 U/L   Alkaline Phosphatase 71 38 - 126 U/L   Total Bilirubin 0.7 0.3 - 1.2 mg/dL   GFR calc non Af Amer 19 (L) >60 mL/min   GFR calc Af Amer 21 (L) >60 mL/min   Anion gap 5 5 - 15  Digoxin level     Status: None   Collection Time: 05/25/16  5:49 AM  Result Value Ref Range   Digoxin Level 1.0 0.8 - 2.0 ng/mL  Glucose, capillary     Status: Abnormal   Collection Time: 05/25/16  8:05 AM  Result Value Ref Range   Glucose-Capillary 165 (H) 65 - 99 mg/dL  Type and screen [place]     Status: None   Collection Time: 05/25/16 10:24 AM  Result Value Ref Range   ABO/RH(D) O NEG    Antibody Screen NEG    Sample Expiration 05/28/2016   Protime-INR     Status: Abnormal   Collection Time: 05/25/16 10:38 AM  Result Value Ref Range   Prothrombin Time 16.5 (H) 11.4 - 15.2 seconds   INR 1.32   ABO/Rh     Status: None   Collection Time: 05/25/16 10:38 AM  Result Value Ref Range   ABO/RH(D) O NEG     EKG: NSR, 1st AVB, LAFB  IMAGING: No results found.  IMPRESSION: Principal Problem:   Bilateral inguinal hernia without obstruction or gangrene  Active Problems:    Pre-operative cardiovascular clearance   Acute on chronic renal insufficiency (HCC)   CAD-known CTO RCA with L-R collaterals   PAF (paroxysmal atrial fibrillation) (HCC)   Anemia, chronic disease   CKD (chronic kidney disease) stage 3, GFR 30-59 ml/min   Chronic anticoagulation   NASH (nonalcoholic steatohepatitis)   Diabetes mellitus type 2, diet-controlled (HCC)   Essential hypertension    COPD (chronic obstructive pulmonary disease) (HCC)   RECOMMENDATION: Increased risk from a cardiovascular standpoint with known stable CAD, PAF, and LVD, but not prohibitive. No recent angina, no CHF, and he is in NSR.  We will be available peri op. Avoid significant anemia and volume shifts post op. He should be on telemetry. MD  to see this PM.   Time Spent Directly with Patient: [address] minutes  [place], [place] beeper 05/25/2016, 4:04 PM

## 2016-05-25 NOTE — Progress Notes (Signed)
Daily Progress Note   Patient Name: Cory Lawrence       Date: 05/25/2016 DOB: 04/30/1925  Age: 82 y.o. MRN#: [id number] Attending Physician: Thurnell Lose, MD Primary Care Physician: [name], MD Admit Date: 05/20/2016  Reason for Consultation/Follow-up: Establishing goals of care  Subjective: Patient is pleasantly confused.  Tells me he is comfortable.  He mentions that he has had many visitors today (actually he repeats himself several times).   I spoke with his wife who is comfortable with the plan for surgery in the morning.   She understands it is his best option to avoid continued debilitating abdominal pain in the future.  She also understands the patient is at significant risk for worsening dementia, infection or a cardiac event as a result of surgery.  Length of Stay: 2  Current Medications: Scheduled Meds:  . antiseptic oral rinse  7 mL Mouth Rinse BID  . donepezil  5 mg Oral QHS  . doxazosin  2 mg Oral QHS  . feeding supplement (ENSURE ENLIVE)  237 mL Oral BID BM  . finasteride  5 mg Oral Daily  . fluticasone furoate-vilanterol  1 puff Inhalation Daily  . heparin subcutaneous  5,000 Units Subcutaneous Q8H  . hydrocortisone sod succinate (SOLU-CORTEF) inj  50 mg Intravenous Daily  . insulin aspart  0-9 Units Subcutaneous TID WC  . isosorbide mononitrate  30 mg Oral Daily  . pantoprazole  40 mg Oral Daily  . polyethylene glycol  17 g Oral Daily  . senna-docusate  1 tablet Oral BID  . simvastatin  20 mg Oral QHS  . ursodiol  250 mg Oral BID    Continuous Infusions: . sodium chloride 75 mL/hr at 05/25/16 1111    PRN Meds: acetaminophen **OR** [DISCONTINUED] acetaminophen, bisacodyl, hydrALAZINE, HYDROmorphone (DILAUDID) injection, ipratropium-albuterol,  LORazepam, metoprolol, ondansetron (ZOFRAN) IV  Physical Exam          Vital Signs: BP (!) 159/52 (BP Location: Right Arm)   Pulse (!) 58   Temp 97.5 F (36.4 C) (Oral)   Resp 20   Ht '5\' 5"'$  (1.651 m)   Wt 54.4 kg (120 lb)   SpO2 98%   BMI 19.97 kg/m  SpO2: SpO2: 98 % O2 Device: O2 Device: Not Delivered O2 Flow Rate:    Intake/output summary:  Intake/Output Summary (Last 24 hours) at 05/25/16 1610 Last data filed at 05/25/16 0845  Gross per 24 hour  Intake              100 ml  Output              950 ml  Net             -850 ml   LBM: Last BM Date: 05/23/16 Baseline Weight: Weight: 54.4 kg (120 lb) Most recent weight: Weight: 54.4 kg (120 lb)       Palliative Assessment/Data:    Flowsheet Rows   Flowsheet Row Most Recent Value  Intake Tab  Referral Department  Hospitalist  Unit at Time of Referral  Med/Surg Unit  Palliative Care Primary Diagnosis  Other (Comment)  Date Notified  05/23/16  Palliative Care Type  New Palliative care  Reason for referral  Clarify Goals of Care  Date of Admission  05/20/16  Date first seen by Palliative Care  05/24/16  # of days Palliative referral response time  1 Day(s)  # of days IP prior to Palliative referral  3  Clinical Assessment  Palliative Performance Scale Score  30%  Psychosocial & Spiritual Assessment  Palliative Care Outcomes  Patient/Family meeting held?  Yes  Who was at the meeting?  patient and wife initially, then just the wife.  Palliative Care Outcomes  Clarified goals of care  Patient/Family wishes: Interventions discontinued/not started   Hemodialysis, PEG      Patient Active Problem List   Diagnosis Date Noted  . Pre-operative cardiovascular clearance 05/25/2016  . Palliative care encounter   . Goals of care, counseling/discussion   . Acute on chronic renal insufficiency ([place]) 05/24/2016  . Malnutrition of moderate degree 05/22/2016  . Acute delirium 05/22/2016  . Inguinal hernia 05/20/2016  .  Bilateral inguinal hernia without obstruction or gangrene 05/20/2016  . LLQ abdominal pain 05/20/2016  . Constipation 05/20/2016  . Anemia, chronic disease 05/20/2016  . Generalized weakness 05/20/2016  . CKD (chronic kidney disease) stage 3, GFR 30-59 ml/min 05/20/2016  . Chronic anticoagulation 05/20/2016  . Elevated serum creatinine   . SOB (shortness of breath)   . Essential hypertension   . Type 2 diabetes mellitus with complication ([place])   . COPD exacerbation ([place]) 12/31/2015  . OA (osteoarthritis) 12/02/2015  . ESR raised 07/22/2014  . Anemia 02/18/2014  . Weight loss 12/11/2013  . LV dysfunction-EF 40-45% 03/02/2012  . PAF (paroxysmal atrial fibrillation) ([place]) 02/22/2012  . Vitamin B12 deficiency 09/29/2011  . COPD with bronchitis 09/29/2011  . History of gastroesophageal reflux (GERD) 09/29/2011  . Diabetes mellitus type 2, diet-controlled ([place])   . Other B-complex deficiencies 05/24/2011  . Other general symptoms  05/17/2011  . NASH (nonalcoholic steatohepatitis) 05/17/2011  . Cirrhosis of liver not due to alcohol  05/17/2011  . Personal history of colonic polyps 05/17/2011  . CAD-known CTO RCA with L-R collaterals 03/17/2011  . Syncope and collapse   . Dizziness   . Diaphoresis   . Chest pain   . Chest tightness   . Palpitations   . COPD (chronic obstructive pulmonary disease) ([place])   . GERD (gastroesophageal reflux disease)   . BPH (benign prostatic hypertrophy)   . HYPERLIPIDEMIA 11/19/2007  . OBESITY 11/19/2007  . HYPERTENSION 11/19/2007  . INTERNAL HEMORRHOIDS 11/19/2007  . GERD 11/19/2007  . DIVERTICULOSIS, COLON 11/19/2007  . BILIARY CIRRHOSIS, PRIMARY 11/19/2007  . BENIGN PROSTATIC HYPERTROPHY, HX OF 11/19/2007  . COLONIC POLYPS,  ADENOMATOUS 07/08/2002    Palliative Care Assessment & Plan   Patient Profile:  82 y.o. male  with past medical history of dementia, bilateral inguinal hernias, COPD, CKD, Afib, NASH, Anemia, CAD, mixed heart failure and  DM2, who was admitted on 05/20/2016 with abdominal pain secondary to bilateral inguinal hernias with constipation.  He has been evaluated by surgery who feel that the hernias could be repaired. He was evaluated by Cardiology who felt that he is at an increased risk for a major adverse cardia event from surgery, but they did not advise against surgery.  He has developed acute on chronic renal failure, but at this point his creatinine is starting to improve.  Urine output is good.  Assessment: 82 yo male with significant dementia and painful bilateral inguinal hernias.  Surgical repair of hernias is planned for 7/27.  Family understands the risks.    Recommendations/Plan:  DNR / DNI  Plan for surgery 7/27  PMT will follow post op.    GOC indicate no hemodialysis or PEG.  No life support.   Code Status: DNR  Prognosis:   Unable to determine.  A great deal depends on how well he recovers from this hospitalization.   Discharge Planning:  SNF vs residential hospice.  Hopeful for SNF rehab with Palliative follow up.  Care plan was discussed with [facility] Attending and wife.  Thank you for allowing the Palliative Medicine Team to assist in the care of this patient.   Time In: 4:00 Time Out: 4:25 Total Time 25 Prolonged Time Billed no      Greater than 50%  of this time was spent counseling and coordinating care related to the above assessment and plan.  Imogene Burn, PA-C Palliative Medicine Pager: [phone]  Please contact Palliative Medicine Team phone at [phone] for questions and concerns.

## 2016-05-26 LAB — BASIC METABOLIC PANEL
ANION GAP: 4 — AB (ref 5–15)
BUN: 47 mg/dL — ABNORMAL HIGH (ref 6–20)
CHLORIDE: 114 mmol/L — AB (ref 101–111)
CO2: 24 mmol/L (ref 22–32)
Calcium: 7.4 mg/dL — ABNORMAL LOW (ref 8.9–10.3)
Creatinine, Ser: 1.65 mg/dL — ABNORMAL HIGH (ref 0.61–1.24)
GFR calc non Af Amer: 35 mL/min — ABNORMAL LOW (ref >60)
GFR, EST AFRICAN AMERICAN: 41 mL/min — AB (ref >60)
Glucose, Bld: 89 mg/dL (ref 65–99)
POTASSIUM: 3.7 mmol/L (ref 3.5–5.1)
SODIUM: 142 mmol/L (ref 135–145)

## 2016-05-26 LAB — CBC
HCT: 24 % — ABNORMAL LOW (ref 39.0–52.0)
HEMOGLOBIN: 8.1 g/dL — AB (ref 13.0–17.0)
MCH: 30.7 pg (ref 26.0–34.0)
MCHC: 33.8 g/dL (ref 30.0–36.0)
MCV: 90.9 fL (ref 78.0–100.0)
Platelets: 92 10*3/uL — ABNORMAL LOW (ref 150–400)
RBC: 2.64 MIL/uL — AB (ref 4.22–5.81)
RDW: 14.6 % (ref 11.5–15.5)
WBC: 5.8 10*3/uL (ref 4.0–10.5)

## 2016-05-26 LAB — GLUCOSE, CAPILLARY
GLUCOSE-CAPILLARY: 94 mg/dL (ref 65–99)
GLUCOSE-CAPILLARY: 122 mg/dL — AB (ref 65–99)
Glucose-Capillary: 114 mg/dL — ABNORMAL HIGH (ref 65–99)
Glucose-Capillary: 138 mg/dL — ABNORMAL HIGH (ref 65–99)

## 2016-05-26 LAB — HEMOGLOBIN AND HEMATOCRIT, BLOOD
HCT: 29.9 % — ABNORMAL LOW (ref 39.0–52.0)
Hemoglobin: 10.1 g/dL — ABNORMAL LOW (ref 13.0–17.0)

## 2016-05-26 LAB — SURGICAL PCR SCREEN
MRSA, PCR: NEGATIVE
STAPHYLOCOCCUS AUREUS: NEGATIVE

## 2016-05-26 LAB — PREPARE RBC (CROSSMATCH)

## 2016-05-26 MED ORDER — CHLORHEXIDINE GLUCONATE CLOTH 2 % EX PADS
6.0000 | MEDICATED_PAD | Freq: Once | CUTANEOUS | Status: AC
  Administered 2016-05-26: 6 via TOPICAL

## 2016-05-26 MED ORDER — FUROSEMIDE 10 MG/ML IJ SOLN
40.0000 mg | Freq: Once | INTRAMUSCULAR | Status: AC
  Administered 2016-05-26: 40 mg via INTRAVENOUS
  Filled 2016-05-26: qty 4

## 2016-05-26 MED ORDER — POTASSIUM CL IN DEXTROSE 5% 20 MEQ/L IV SOLN
20.0000 meq | INTRAVENOUS | Status: DC
  Administered 2016-05-27: 20 meq via INTRAVENOUS
  Filled 2016-05-26: qty 1000

## 2016-05-26 MED ORDER — DIPHENHYDRAMINE HCL 50 MG/ML IJ SOLN
25.0000 mg | Freq: Four times a day (QID) | INTRAMUSCULAR | Status: DC | PRN
  Administered 2016-06-01: 25 mg via INTRAVENOUS
  Filled 2016-05-26: qty 1

## 2016-05-26 MED ORDER — INSULIN ASPART 100 UNIT/ML ~~LOC~~ SOLN
0.0000 [IU] | Freq: Four times a day (QID) | SUBCUTANEOUS | Status: DC
  Administered 2016-05-26: 1 [IU] via SUBCUTANEOUS
  Administered 2016-05-28 (×2): 2 [IU] via SUBCUTANEOUS

## 2016-05-26 MED ORDER — VANCOMYCIN HCL IN DEXTROSE 1-5 GM/200ML-% IV SOLN
1000.0000 mg | INTRAVENOUS | Status: AC
  Administered 2016-05-27: 1000 mg via INTRAVENOUS

## 2016-05-26 MED ORDER — POTASSIUM CL IN DEXTROSE 5% 20 MEQ/L IV SOLN: 20.0000 meq | INTRAVENOUS | Status: DC

## 2016-05-26 MED ORDER — OXYCODONE-ACETAMINOPHEN 5-325 MG PO TABS
2.0000 | ORAL_TABLET | Freq: Once | ORAL | Status: AC
  Administered 2016-05-26: 2 via ORAL
  Filled 2016-05-26: qty 2

## 2016-05-26 MED ORDER — POTASSIUM CL IN DEXTROSE 5% 20 MEQ/L IV SOLN
20.0000 meq | INTRAVENOUS | Status: DC
  Filled 2016-05-26: qty 1000

## 2016-05-26 MED ORDER — CHLORHEXIDINE GLUCONATE CLOTH 2 % EX PADS: 6.0000 | MEDICATED_PAD | Freq: Once | CUTANEOUS | Status: DC

## 2016-05-26 MED ORDER — SODIUM CHLORIDE 0.9 % IV SOLN
Freq: Once | INTRAVENOUS | Status: AC
  Administered 2016-05-26: 08:00:00 via INTRAVENOUS

## 2016-05-26 NOTE — Progress Notes (Signed)
Pt arrived on the unit at 1830.  Patient settled and vitals signs stable.  Will pass on report to night RN.

## 2016-05-26 NOTE — Progress Notes (Signed)
PROGRESS NOTE                                                                                                                                                                                                             Patient Demographics:    Cory Lawrence, is a 82 y.o. male, DOB - 16-Apr-1925, [id number]  Admit date - 05/20/2016   Admitting Physician Murlean Iba, MD  Outpatient Primary MD for the patient is [name], [name], MD  LOS - 3  Chief Complaint  Patient presents with  . Hernia       Brief Narrative   82 year old Caucasian male with history of advanced dementia, paroxysmal atrial fibrillation [name] 2 score of at least 6, on xaralto, COPD, BPH, bilateral inguinal hernias, CAD, chronic combined systolic and diastolic CHF EF [id number], Nash, CK D stage III, DM type II, primary biliary cirrhosis who was admitted to the hospital with abdominal pain due to incarcerated hernias left more than right. He was also found to be in acute renal failure. General surgery palliative care following. Patient is DO NOT RESUSCITATE. Family is very reasonable, however in my opinion patient will have no chance of survival are good quality of life if we do not proceed with surgical correction of his incarcerated hernia. I have discussed this with general surgeon Dr. Wynonia Sours on 05/25/2016.  He shouldn't and wife clearly understand that he is a very high risk of adverse cardio pulmonary outcome and the accept this risk, they want to take the chance of undergoing surgery with at least some chance of pain-free and decent quality of life. They agree that if surgery does not go well patient might die or end up with residential hospice, for now plan was to defer surgery with residential hospice anyways.     Subjective:    [name] today has, No headache, No chest pain, No abdominal pain - No Nausea, No new weakness tingling or numbness, No  Cough - SOB.    Assessment  & Plan :     1.Bilateral incarcerated inguinal hernias left more than right. Had detailed discussion with patient and wife, along with general surgeon Dr. Wynonia Sours on 05/25/2016, plan now is to provide patient with some chance of pain-free life and proceed with surgery in the next few days. Patient and wife clearly understand  that he is a very high risk of adverse cardio pulmonary outcome during the perioperative period, he may die during the surgery however his only chance of pain-free life with any meaning of quality is undergoing surgery. Family was contemplating of not undergoing surgery and going straight to residential hospice. Has also been seen by cardiology for preop evaluation.   2. CAD with chronic ischemic cardiomyopathy causing chronic combined systolic and diastolic heart failure EF 40%. Currently compensated. We appreciate cardiology input Continue Imdur and statin for secondary prevention.  3. Paroxysmal atrial fibrillation with [name] 2 score overtly 6. Currently in sinus bradycardia, not on any rate controlling medications except digoxin which has been held due to renal insufficiency, digoxin level is stable, xaralto currently on hold due to pending surgery. Platelets are borderline and I doubt bridging aggressively with heparin will provide any meaningful benefit.  4. Dyslipidemia. On statin.  5. [name] with primary biliary cirrhosis. Supportive care, continue Urosodiol.  6. Acute renal failure on CK D stage III. Baseline creatinine close to 1.5. Being hydrated renal function improving, will check a baseline bladder scan. Has condom catheter.  7. Advanced dementia, generalized deconditioning - I risk for delirium, continue supportive care with home dementia medications, patient and wife wish him to be DO NOT RESUSCITATE, they do not wish any unnecessary heroics. If stable will go to SNF if declines will go to residential hospice.  8. Essential  hypertension. For now as needed IV hydralazine.  9. GERD. On PPI.  10. History of COPD. Stable continue supportive care with oxygen and nebulizer treatments as needed.  11. Chronic low-dose prednisone use. Wife unsure why he takes it, for now 50 mg of IV hydrocortisone daily, if blood pressure drops will change to 100 mg 3 times a day.  12. AOCD. Type screen will transfuse 1 unit of packed RBC preop on 05/26/2016. Monitor H&H.  13. DM type II. Monitor on sliding scale.  Lab Results  Component Value Date   HGBA1C 5.3 05/21/2016    CBG (last 3)   Recent Labs  05/25/16 1721 05/25/16 2128 05/26/16 0803  GLUCAP 137* 87 94      Family Communication  :  Wife  Code Status :  DNR  Diet : NPO for surgery then heart healthy as tolerated  Disposition Plan  :  Likely SNF    Consults  :  CCS, Pall Care, Cards  Procedures  :    TTE   Normal LV wall thickness with LVEF approximately 40-45%. Mid to basal inferolateral hypokinesis. Grade 1 diastolic dysfunction with normal estimated LV filling pressure. Mild left atrial enlargement. MAC with trivial mitral regurgitation. Mild to moderate calcific aortic stenosis. Trivial tricuspid regurgitation.   DVT Prophylaxis  :   Heparin    Lab Results  Component Value Date   PLT 92 (L) 05/26/2016    Inpatient Medications  Scheduled Meds: . sodium chloride   Intravenous Once  . antiseptic oral rinse  7 mL Mouth Rinse BID  . donepezil  5 mg Oral QHS  . doxazosin  2 mg Oral QHS  . feeding supplement (ENSURE ENLIVE)  237 mL Oral BID BM  . finasteride  5 mg Oral Daily  . fluticasone furoate-vilanterol  1 puff Inhalation Daily  . heparin subcutaneous  5,000 Units Subcutaneous Q8H  . hydrocortisone sod succinate (SOLU-CORTEF) inj  50 mg Intravenous Daily  . insulin aspart  0-9 Units Subcutaneous Q6H  . isosorbide mononitrate  30 mg Oral Daily  .  pantoprazole  40 mg Oral Daily  . polyethylene glycol  17 g Oral Daily  . senna-docusate   1 tablet Oral BID  . simvastatin  20 mg Oral QHS  . ursodiol  250 mg Oral BID   Continuous Infusions:   PRN Meds:.acetaminophen **OR** [DISCONTINUED] acetaminophen, bisacodyl, diphenhydrAMINE, hydrALAZINE, ipratropium-albuterol, metoprolol, ondansetron (ZOFRAN) IV  Antibiotics  :    Anti-infectives    None         Objective:   Vitals:   05/25/16 2130 05/26/16 0546 05/26/16 0905 05/26/16 0907  BP: (!) 161/59 (!) 165/53    Pulse: 65 (!) 51    Resp: 19 19    Temp: 97.8 F (36.6 C) 97.4 F (36.3 C)    TempSrc: Oral Oral    SpO2: 98% 99% 95% 95%  Weight:      Height:        Wt Readings from Last 3 Encounters:  05/20/16 54.4 kg (120 lb)  03/24/16 56.9 kg (125 lb 6.4 oz)  02/11/16 57.6 kg (127 lb)    No intake or output data in the 24 hours ending 05/26/16 0959   Physical Exam  Awake but Pleasantly confused, No new F.N deficits,   [place].AT,PERRAL Supple Neck,No JVD, No cervical lymphadenopathy appriciated.  Symmetrical Chest wall movement, Good air movement bilaterally, CTAB RRR,No Gallops,Rubs or new Murmurs, No Parasternal Heave +ve B.Sounds, Abd Soft, No tenderness, No organomegaly appriciated, No rebound - guarding or rigidity. No Cyanosis, Clubbing or edema, No new Rash or bruise  Bilat Ing hernia    Data Review:    CBC  Recent Labs Lab 05/20/16 1508 05/21/16 0550 05/23/16 0854 05/25/16 0549 05/26/16 0529  WBC 8.1 6.1 7.6 6.5 5.8  HGB 9.4* 8.7* 9.6* 9.0* 8.1*  HCT 28.2* 26.3* 28.6* 27.4* 24.0*  PLT 171 154 121* 100* 92*  MCV 92.5 92.6 92.3 91.9 90.9  MCH 30.8 30.6 31.0 30.2 30.7  MCHC 33.3 33.1 33.6 32.8 33.8  RDW 14.7 14.8 14.9 14.8 14.6  LYMPHSABS  --   --   --  1.0  --   MONOABS  --   --   --  0.4  --   EOSABS  --   --   --  0.1  --   BASOSABS  --   --   --  0.0  --     Chemistries   Recent Labs Lab 05/20/16 1508 05/21/16 0550 05/23/16 0854 05/25/16 0549 05/26/16 0529  NA 139 138 138 139 142  K 4.1 4.1 4.5 4.0 3.7  CL 106 106 105  110 114*  CO2 28 26 24 24 24   GLUCOSE 115* 97 96 100* 89  BUN 55* 55* 64* 55* 47*  CREATININE 1.75* 2.35* 3.64* 2.81* 1.65*  CALCIUM 8.5* 8.2* 8.1* 7.5* 7.4*  AST  --   --   --  14*  --   ALT  --   --   --  12*  --   ALKPHOS  --   --   --  71  --   BILITOT  --   --   --  0.7  --    ------------------------------------------------------------------------------------------------------------------ No results for input(s): CHOL, HDL, LDLCALC, TRIG, CHOLHDL, LDLDIRECT in the last 72 hours.  Lab Results  Component Value Date   HGBA1C 5.3 05/21/2016   ------------------------------------------------------------------------------------------------------------------ No results for input(s): TSH, T4TOTAL, T3FREE, THYROIDAB in the last 72 hours.  Invalid input(s): FREET3 ------------------------------------------------------------------------------------------------------------------ No results for input(s): VITAMINB12, FOLATE, FERRITIN, TIBC, IRON, RETICCTPCT  in the last 72 hours.  Coagulation profile  Recent Labs Lab 05/25/16 1038  INR 1.32    No results for input(s): DDIMER in the last 72 hours.  Cardiac Enzymes No results for input(s): CKMB, TROPONINI, MYOGLOBIN in the last 168 hours.  Invalid input(s): CK ------------------------------------------------------------------------------------------------------------------    Component Value Date/Time   BNP 116.5 (H) 12/31/2015 1720    Micro Results No results found for this or any previous visit (from the past 240 hour(s)).  Radiology Reports Dg Chest 2 View  Result Date: 05/05/2016 CLINICAL DATA:  Chest pain, left flank pain, fall 5 days ago EXAM: CHEST  2 VIEW COMPARISON:  12/31/2015 FINDINGS: Cardiomediastinal silhouette is stable. Hyperinflation again noted. Stable chronic interstitial prominence. There is left basilar streaky atelectasis or early infiltrate. No pulmonary edema. Osteopenia and degenerative changes thoracic  spine. Atherosclerotic calcifications of thoracic aorta. No pneumothorax. IMPRESSION: Hyperinflation. Chronic interstitial prominence. No pulmonary edema. Streaky left basilar atelectasis or early infiltrate. No pneumothorax. Electronically Signed   By: Lahoma Crocker M.D.   On: 05/05/2016 13:47   Dg Ribs Unilateral W/chest Left  Result Date: 05/05/2016 CLINICAL DATA:  Left-sided chest pain, fell 5 days ago EXAM: LEFT RIBS AND CHEST - 3+ VIEW COMPARISON:  Chest x-ray of 12/31/2015 FINDINGS: On the images obtained, no acute left rib fracture is seen. The current images are very obliqued and standard rib detail views would be helpful. The lungs appear to be hyperaerated consistent with an element of emphysema. IMPRESSION: 1. No acute left rib fracture seen on the images obtained. Please see above. 2. Emphysema. Electronically Signed   By: Ivar Drape M.D.   On: 05/05/2016 13:47   Dg Abd Acute W/chest  Result Date: 05/20/2016 CLINICAL DATA:  Left lower abdominal pain for 4-5 days EXAM: DG ABDOMEN ACUTE W/ 1V CHEST COMPARISON:  05/16/2016 FINDINGS: There is no evidence of dilated bowel loops or free intraperitoneal air. No radiopaque calculi or other significant radiographic abnormality is seen. Heart size and mediastinal contours are within normal limits. The lungs are hyperinflated likely secondary to COPD. IMPRESSION: Negative abdominal radiographs.  No acute cardiopulmonary disease. Electronically Signed   By: Kathreen Devoid   On: 05/20/2016 15:37   Dg Abd Acute W/chest  Result Date: 05/16/2016 CLINICAL DATA:  82 year old male with history of bilateral inguinal hernias and discomfort for the past 3 days EXAM: DG ABDOMEN ACUTE W/ 1V CHEST COMPARISON:  Chest x-ray 05/05/2016. FINDINGS: Lung volumes appear slightly increased and there are mild emphysematous changes noted. Architectural distortion at the base of the left lung, similar to prior studies, favored to reflect areas of chronic scarring. Small bilateral  pleural effusions. No evidence of pulmonary edema. No suspicious appearing pulmonary nodules or masses. Heart size is normal. Upper mediastinal contours are within normal limits. Aortic atherosclerosis. Gas and stool are noted throughout the colon extending to the level of the distal rectum. No pathologic dilatation of small bowel is noted. Several nondilated gas-filled loops of small bowel are noted, predominantly in the left side of the abdomen. No pneumoperitoneum. IMPRESSION: 1. Nonobstructive bowel gas pattern. 2. No pneumoperitoneum. 3. Small bilateral pleural effusions. 4. Probable scarring the left lung base. 5. Aortic atherosclerosis. Electronically Signed   By: Vinnie Langton M.D.   On: 05/16/2016 15:32    Time Spent in minutes  30   Davion Meara K M.D on 05/26/2016 at 9:59 AM  Between 7am to 7pm - Pager - [phone]  After 7pm go to [email or web address] - password [facility]  [facility] -  Office  [phone]

## 2016-05-26 NOTE — Progress Notes (Signed)
  Subjective:  I appreciate all of the input from internal medicine, cardiology, and palliative care. I agree that the most reasonable option for him is to proceed with surgery to repair his bilateral inguinal hernias. His life will probably be miserable with repeated hospitalizations and possible strangulation and less we do this. This is not without risk, including perioperative MI, stroke, and heart failure. The patient has fair understanding of his risk and his wife has excellent understanding and they both seem to except this and would like to have something done  He had a much quieter night with much less pain.  Wife notices slight increased confusion compared to yesterday.  He is on heparin currently and has not taken his Xarelto since admission according to pharmacy  I have made him nothing by mouth and will discuss with Dr. Armandina Gemma this morning about timing of surgery.  Hopefully this can be scheduled today or tomorrow.  Objective: Vital signs in last 24 hours: Temp:  [97.4 F (36.3 C)-97.8 F (36.6 C)] 97.4 F (36.3 C) (07/27 0546) Pulse Rate:  [51-65] 51 (07/27 0546) Resp:  [18-20] 19 (07/27 0546) BP: (159-165)/(52-59) 165/53 (07/27 0546) SpO2:  [96 %-99 %] 99 % (07/27 0546) Last BM Date: 05/25/16  Intake/Output from previous day: 07/26 0701 - 07/27 0700 In: -  Out: 475 [Urine:475] Intake/Output this shift: No intake/output data recorded.  General appearance: Awake.  Cooperative.  A little more confused than yesterday Resp: clear to auscultation bilaterally GI: soft, non-tender; bowel sounds normal; no masses,  no organomegaly Male genitalia: normal, Left inguinal hernia is protruding but not as large as yesterday.  A little bit painful but much easier to reduce compared to yesterday.  Writing oral hernia soft and easily reducible without pain.  No scrotal mass  Lab Results:   Recent Labs  05/23/16 0854 05/25/16 0549  WBC 7.6 6.5  HGB 9.6* 9.0*  HCT  28.6* 27.4*  PLT 121* 100*   BMET  Recent Labs  05/23/16 0854 05/25/16 0549  NA 138 139  K 4.5 4.0  CL 105 110  CO2 24 24  GLUCOSE 96 100*  BUN 64* 55*  CREATININE 3.64* 2.81*  CALCIUM 8.1* 7.5*   PT/INR  Recent Labs  05/25/16 1038  LABPROT 16.5*  INR 1.32   ABG No results for input(s): PHART, HCO3 in the last 72 hours.  Invalid input(s): PCO2, PO2  Studies/Results: No results found.  Anti-infectives: Anti-infectives    None      Assessment/Plan:   Bilateral inguinal hernias.     I think it is in his best interest for him to undergo bilateral inguinal hernia repair this admission and avoid repeated hospitalizations and possible incarceration and strangulation.      He is on heparin which can be discontinued perioperatively.  Has not had Xarelto since admission according to pharmacy.      He is now nothing by mouth      I discussed surgery with the patient and also his wife.  They are in full agreement.      I will discuss plans for surgical timing with Dr. Armandina Gemma this morning.  He will decide whether this is to be done today or tomorrow.  Coronary artery disease Atrial fibrillation on Xarelto COPD Chronic kidney disease Chronic anemia Confusion/dementia - Continue Aricept.  Discontinue Ativan.  Discontinue narcotics.     LOS: 3 days    Ameri Cahoon M 05/26/2016

## 2016-05-26 NOTE — Progress Notes (Signed)
OT Cancellation Note  Patient Details Name: Cory Lawrence MRN: [id number] DOB: 1925-02-11   Cancelled Treatment:     Reason Eval/Treat Not Completed: Other -- Patient NPO with surgery planned for today. Will check on patient tomorrow.  Thermon Zulauf A 05/26/2016, 1:43 PM

## 2016-05-26 NOTE — Progress Notes (Signed)
Patient ID: Cory Lawrence, male   DOB: 31-Jan-1925, 82 y.o.   MRN: [id number]  [facility]  Patient seen and examined.  Discussed with Dr. Dalbert Batman earlier today.  Plan left inguinal hernia repair with mesh in AM 7/28.  Orders entered.  The risks and benefits of the procedure have been discussed at length with the patient.  The patient understands the proposed procedure, potential alternative treatments, and the course of recovery to be expected.  All of the patient's questions have been answered at this time.  The patient wishes to proceed with surgery.  Earnstine Regal, MD, [facility] Office: [phone]

## 2016-05-26 NOTE — Progress Notes (Signed)
Patient has been accepted to [facility]. Will follow up for disposition when medically stable.

## 2016-05-27 ENCOUNTER — Inpatient Hospital Stay (HOSPITAL_COMMUNITY): Payer: Medicare Other | Admitting: Anesthesiology

## 2016-05-27 ENCOUNTER — Encounter (HOSPITAL_COMMUNITY)
Admission: EM | Disposition: A | Discharge status: Skilled Nursing Facility | Payer: Self-pay | Source: Home / Self Care | Attending: Internal Medicine

## 2016-05-27 ENCOUNTER — Encounter (HOSPITAL_COMMUNITY): Payer: Self-pay | Admitting: Surgery

## 2016-05-27 HISTORY — PX: INGUINAL HERNIA REPAIR: SHX194

## 2016-05-27 HISTORY — PX: INSERTION OF MESH: SHX5868

## 2016-05-27 LAB — BASIC METABOLIC PANEL
ANION GAP: 6 (ref 5–15)
BUN: 42 mg/dL — AB (ref 6–20)
CALCIUM: 7.5 mg/dL — AB (ref 8.9–10.3)
CO2: 24 mmol/L (ref 22–32)
Chloride: 109 mmol/L (ref 101–111)
Creatinine, Ser: 1.55 mg/dL — ABNORMAL HIGH (ref 0.61–1.24)
GFR calc Af Amer: 44 mL/min — ABNORMAL LOW (ref >60)
GFR calc non Af Amer: 38 mL/min — ABNORMAL LOW (ref >60)
GLUCOSE: 95 mg/dL (ref 65–99)
Potassium: 3.2 mmol/L — ABNORMAL LOW (ref 3.5–5.1)
Sodium: 139 mmol/L (ref 135–145)

## 2016-05-27 LAB — CBC
HEMATOCRIT: 28.3 % — AB (ref 39.0–52.0)
Hemoglobin: 9.8 g/dL — ABNORMAL LOW (ref 13.0–17.0)
MCH: 31.2 pg (ref 26.0–34.0)
MCHC: 34.6 g/dL (ref 30.0–36.0)
MCV: 90.1 fL (ref 78.0–100.0)
Platelets: 96 10*3/uL — ABNORMAL LOW (ref 150–400)
RBC: 3.14 MIL/uL — ABNORMAL LOW (ref 4.22–5.81)
RDW: 15.2 % (ref 11.5–15.5)
WBC: 8.2 10*3/uL (ref 4.0–10.5)

## 2016-05-27 LAB — GLUCOSE, CAPILLARY
GLUCOSE-CAPILLARY: 88 mg/dL (ref 65–99)
GLUCOSE-CAPILLARY: 116 mg/dL — AB (ref 65–99)
GLUCOSE-CAPILLARY: 120 mg/dL — AB (ref 65–99)
Glucose-Capillary: 136 mg/dL — ABNORMAL HIGH (ref 65–99)
Glucose-Capillary: 97 mg/dL (ref 65–99)

## 2016-05-27 LAB — TYPE AND SCREEN
ABO/RH(D): O NEG
Antibody Screen: NEGATIVE
UNIT DIVISION: 0

## 2016-05-27 LAB — MAGNESIUM: Magnesium: 1.7 mg/dL (ref 1.7–2.4)

## 2016-05-27 SURGERY — REPAIR, HERNIA, INGUINAL, ADULT
Anesthesia: General | Site: Groin | Laterality: Left

## 2016-05-27 MED ORDER — HYDROCODONE-ACETAMINOPHEN 5-325 MG PO TABS
1.0000 | ORAL_TABLET | ORAL | Status: DC | PRN
  Administered 2016-05-27 – 2016-05-28 (×2): 2 via ORAL
  Administered 2016-05-30 (×2): 1 via ORAL
  Filled 2016-05-27: qty 2
  Filled 2016-05-27 (×2): qty 1
  Filled 2016-05-27: qty 2

## 2016-05-27 MED ORDER — DEXTROSE 5 % IV SOLN: INTRAVENOUS | Status: DC

## 2016-05-27 MED ORDER — MAGNESIUM SULFATE IN D5W 1-5 GM/100ML-% IV SOLN
1.0000 g | Freq: Once | INTRAVENOUS | Status: AC
  Administered 2016-05-27: 1 g via INTRAVENOUS

## 2016-05-27 MED ORDER — BUPIVACAINE HCL (PF) 0.5 % IJ SOLN
INTRAMUSCULAR | Status: AC
  Filled 2016-05-27: qty 30

## 2016-05-27 MED ORDER — ACETAMINOPHEN 650 MG RE SUPP
650.0000 mg | Freq: Four times a day (QID) | RECTAL | Status: DC | PRN
  Filled 2016-05-27: qty 1

## 2016-05-27 MED ORDER — EPHEDRINE SULFATE 50 MG/ML IJ SOLN
INTRAMUSCULAR | Status: AC
  Filled 2016-05-27: qty 1

## 2016-05-27 MED ORDER — 0.9 % SODIUM CHLORIDE (POUR BTL) OPTIME
TOPICAL | Status: DC | PRN
  Administered 2016-05-27: 1000 mL

## 2016-05-27 MED ORDER — ONDANSETRON HCL 4 MG/2ML IJ SOLN: 4.0000 mg | Freq: Four times a day (QID) | INTRAMUSCULAR | Status: DC | PRN

## 2016-05-27 MED ORDER — HYDROMORPHONE HCL 1 MG/ML IJ SOLN
1.0000 mg | INTRAMUSCULAR | Status: DC | PRN
  Administered 2016-05-28 – 2016-06-02 (×3): 1 mg via INTRAVENOUS
  Filled 2016-05-27 (×3): qty 1

## 2016-05-27 MED ORDER — MAGNESIUM SULFATE 50 % IJ SOLN: 1.0000 g | Freq: Once | INTRAMUSCULAR | Status: DC

## 2016-05-27 MED ORDER — LACTATED RINGERS IV SOLN
INTRAVENOUS | Status: DC
  Administered 2016-05-27: 09:00:00 via INTRAVENOUS

## 2016-05-27 MED ORDER — ACETAMINOPHEN 325 MG PO TABS
650.0000 mg | ORAL_TABLET | Freq: Four times a day (QID) | ORAL | Status: DC | PRN
  Administered 2016-05-28 – 2016-05-29 (×2): 650 mg via ORAL
  Filled 2016-05-27 (×2): qty 2

## 2016-05-27 MED ORDER — ROCURONIUM BROMIDE 100 MG/10ML IV SOLN
INTRAVENOUS | Status: AC
  Filled 2016-05-27: qty 1

## 2016-05-27 MED ORDER — VANCOMYCIN HCL IN DEXTROSE 1-5 GM/200ML-% IV SOLN
INTRAVENOUS | Status: AC
  Filled 2016-05-27: qty 200

## 2016-05-27 MED ORDER — POTASSIUM CHLORIDE 10 MEQ/100ML IV SOLN
10.0000 meq | INTRAVENOUS | Status: DC
  Filled 2016-05-27: qty 100

## 2016-05-27 MED ORDER — EPHEDRINE SULFATE 50 MG/ML IJ SOLN
INTRAMUSCULAR | Status: DC | PRN
  Administered 2016-05-27: 5 mg via INTRAVENOUS

## 2016-05-27 MED ORDER — FENTANYL CITRATE (PF) 100 MCG/2ML IJ SOLN
25.0000 ug | INTRAMUSCULAR | Status: DC | PRN
  Administered 2016-05-27: 25 ug via INTRAVENOUS

## 2016-05-27 MED ORDER — LIDOCAINE HCL (CARDIAC) 20 MG/ML IV SOLN
INTRAVENOUS | Status: AC
  Filled 2016-05-27: qty 5

## 2016-05-27 MED ORDER — FENTANYL CITRATE (PF) 100 MCG/2ML IJ SOLN
INTRAMUSCULAR | Status: AC
  Filled 2016-05-27: qty 2

## 2016-05-27 MED ORDER — PROPOFOL 10 MG/ML IV BOLUS
INTRAVENOUS | Status: DC | PRN
  Administered 2016-05-27: 20 mg via INTRAVENOUS
  Administered 2016-05-27: 70 mg via INTRAVENOUS

## 2016-05-27 MED ORDER — FENTANYL CITRATE (PF) 100 MCG/2ML IJ SOLN
INTRAMUSCULAR | Status: AC
  Administered 2016-05-27: 25 ug
  Filled 2016-05-27: qty 2

## 2016-05-27 MED ORDER — KCL IN DEXTROSE-NACL 20-5-0.45 MEQ/L-%-% IV SOLN
INTRAVENOUS | Status: DC
  Administered 2016-05-27 – 2016-05-28 (×2): via INTRAVENOUS
  Filled 2016-05-27 (×2): qty 1000

## 2016-05-27 MED ORDER — PROPOFOL 10 MG/ML IV BOLUS
INTRAVENOUS | Status: AC
  Filled 2016-05-27: qty 20

## 2016-05-27 MED ORDER — LIDOCAINE HCL (CARDIAC) 20 MG/ML IV SOLN
INTRAVENOUS | Status: DC | PRN
  Administered 2016-05-27: 20 mg via INTRAVENOUS

## 2016-05-27 MED ORDER — SUCCINYLCHOLINE CHLORIDE 20 MG/ML IJ SOLN
INTRAMUSCULAR | Status: DC | PRN
  Administered 2016-05-27: 100 mg via INTRAVENOUS

## 2016-05-27 MED ORDER — ETOMIDATE 2 MG/ML IV SOLN
INTRAVENOUS | Status: AC
  Filled 2016-05-27: qty 10

## 2016-05-27 MED ORDER — ONDANSETRON 4 MG PO TBDP: 4.0000 mg | ORAL_TABLET | Freq: Four times a day (QID) | ORAL | Status: DC | PRN

## 2016-05-27 MED ORDER — POTASSIUM CHLORIDE 10 MEQ/100ML IV SOLN
10.0000 meq | INTRAVENOUS | Status: AC
  Administered 2016-05-27 (×4): 10 meq via INTRAVENOUS
  Filled 2016-05-27 (×3): qty 100

## 2016-05-27 MED ORDER — ONDANSETRON HCL 4 MG/2ML IJ SOLN
INTRAMUSCULAR | Status: AC
  Filled 2016-05-27: qty 2

## 2016-05-27 MED ORDER — MAGNESIUM SULFATE IN D5W 1-5 GM/100ML-% IV SOLN
1.0000 g | Freq: Once | INTRAVENOUS | Status: DC
  Filled 2016-05-27: qty 100

## 2016-05-27 MED ORDER — BUPIVACAINE HCL (PF) 0.5 % IJ SOLN
INTRAMUSCULAR | Status: DC | PRN
  Administered 2016-05-27: 20 mL

## 2016-05-27 MED ORDER — SODIUM CHLORIDE 0.9 % IJ SOLN
INTRAMUSCULAR | Status: AC
  Filled 2016-05-27: qty 10

## 2016-05-27 MED ORDER — FENTANYL CITRATE (PF) 100 MCG/2ML IJ SOLN
INTRAMUSCULAR | Status: DC | PRN
  Administered 2016-05-27: 25 ug via INTRAVENOUS
  Administered 2016-05-27: 75 ug via INTRAVENOUS

## 2016-05-27 MED ORDER — ONDANSETRON HCL 4 MG/2ML IJ SOLN
INTRAMUSCULAR | Status: DC | PRN
  Administered 2016-05-27: 4 mg via INTRAVENOUS

## 2016-05-27 SURGICAL SUPPLY — 39 items
APL SKNCLS STERI-STRIP NONHPOA (GAUZE/BANDAGES/DRESSINGS) ×2
APPLICATOR COTTON TIP 6IN STRL (MISCELLANEOUS) ×4 IMPLANT
BENZOIN TINCTURE PRP APPL 2/3 (GAUZE/BANDAGES/DRESSINGS) ×4 IMPLANT
BLADE HEX COATED 2.75 (ELECTRODE) ×4 IMPLANT
BLADE SURG 15 STRL LF DISP TIS (BLADE) ×2 IMPLANT
BLADE SURG 15 STRL SS (BLADE) ×4
BLADE SURG SZ10 CARB STEEL (BLADE) IMPLANT
CLOSURE WOUND 1/2 X4 (GAUZE/BANDAGES/DRESSINGS) ×1
COVER SURGICAL LIGHT HANDLE (MISCELLANEOUS) ×4 IMPLANT
DECANTER SPIKE VIAL GLASS SM (MISCELLANEOUS) ×4 IMPLANT
DRAIN PENROSE 18X1/2 LTX STRL (DRAIN) ×4 IMPLANT
DRAPE LAPAROTOMY TRNSV 102X78 (DRAPE) ×4 IMPLANT
ELECT PENCIL ROCKER SW 15FT (MISCELLANEOUS) ×4 IMPLANT
ELECT REM PT RETURN 9FT ADLT (ELECTROSURGICAL) ×4
ELECTRODE REM PT RTRN 9FT ADLT (ELECTROSURGICAL) ×2 IMPLANT
GAUZE SPONGE 4X4 12PLY STRL (GAUZE/BANDAGES/DRESSINGS) ×4 IMPLANT
GLOVE BIOGEL PI IND STRL 7.0 (GLOVE) ×2 IMPLANT
GLOVE BIOGEL PI INDICATOR 7.0 (GLOVE) ×2
GLOVE SURG ORTHO 8.0 STRL STRW (GLOVE) ×4 IMPLANT
GOWN STRL REUS W/TWL LRG LVL3 (GOWN DISPOSABLE) ×4 IMPLANT
GOWN STRL REUS W/TWL XL LVL3 (GOWN DISPOSABLE) ×8 IMPLANT
KIT BASIN OR (CUSTOM PROCEDURE TRAY) ×4 IMPLANT
MESH ULTRAPRO 3X6 7.6X15CM (Mesh General) ×2 IMPLANT
NDL HYPO 25X1 1.5 SAFETY (NEEDLE) ×2 IMPLANT
NEEDLE HYPO 25X1 1.5 SAFETY (NEEDLE) ×4 IMPLANT
NS IRRIG 1000ML POUR BTL (IV SOLUTION) ×4 IMPLANT
PACK BASIC VI WITH GOWN DISP (CUSTOM PROCEDURE TRAY) ×4 IMPLANT
SPONGE LAP 4X18 X RAY DECT (DISPOSABLE) ×12 IMPLANT
STRIP CLOSURE SKIN 1/2X4 (GAUZE/BANDAGES/DRESSINGS) ×3 IMPLANT
SUT MNCRL AB 4-0 PS2 18 (SUTURE) ×4 IMPLANT
SUT NOVA NAB DX-16 0-1 5-0 T12 (SUTURE) ×2 IMPLANT
SUT NOVA NAB GS-22 2 0 T19 (SUTURE) ×8 IMPLANT
SUT SILK 2 0 SH (SUTURE) ×4 IMPLANT
SUT VIC AB 3-0 SH 18 (SUTURE) ×4 IMPLANT
SYR BULB IRRIGATION 50ML (SYRINGE) ×4 IMPLANT
SYR CONTROL 10ML LL (SYRINGE) ×4 IMPLANT
TAPE CLOTH 4X10 WHT NS (GAUZE/BANDAGES/DRESSINGS) ×2 IMPLANT
TOWEL OR 17X26 10 PK STRL BLUE (TOWEL DISPOSABLE) ×4 IMPLANT
YANKAUER SUCT BULB TIP 10FT TU (MISCELLANEOUS) ×4 IMPLANT

## 2016-05-27 NOTE — Anesthesia Procedure Notes (Signed)
Procedure Name: Intubation Date/Time: 05/27/2016 8:57 AM Performed by: Glory Buff Pre-anesthesia Checklist: Patient identified, Emergency Drugs available, Suction available and Patient being monitored Patient Re-evaluated:Patient Re-evaluated prior to inductionOxygen Delivery Method: Circle system utilized Preoxygenation: Pre-oxygenation with 100% oxygen Intubation Type: IV induction Ventilation: Mask ventilation without difficulty Laryngoscope Size: Miller and 3 Grade View: Grade I Tube type: Oral Tube size: 7.5 mm Number of attempts: 1 Airway Equipment and Method: Stylet and Oral airway Placement Confirmation: ETT inserted through vocal cords under direct vision,  positive ETCO2 and breath sounds checked- equal and bilateral Secured at: 21 cm Tube secured with: Tape Dental Injury: Teeth and Oropharynx as per pre-operative assessment

## 2016-05-27 NOTE — Progress Notes (Signed)
PROGRESS NOTE                                                                                                                                                                                                             Patient Demographics:    Cory Lawrence, is a 82 y.o. male, DOB - 1925-09-14, [id number]  Admit date - 05/20/2016   Admitting Physician Murlean Iba, MD  Outpatient Primary MD for the patient is [name], [name], MD  LOS - 4  Chief Complaint  Patient presents with  . Hernia       Brief Narrative   82 year old Caucasian male with history of advanced dementia, paroxysmal atrial fibrillation [name] 2 score of at least 6, on xaralto, COPD, BPH, bilateral inguinal hernias, CAD, chronic combined systolic and diastolic CHF EF [id number], Nash, CK D stage III, DM type II, primary biliary cirrhosis who was admitted to the hospital with abdominal pain due to incarcerated hernias left more than right. He was also found to be in acute renal failure. General surgery palliative care following. Patient is DO NOT RESUSCITATE. Family is very reasonable, however in my opinion patient will have no chance of survival are good quality of life if we do not proceed with surgical correction of his incarcerated hernia. I have discussed this with general surgeon Dr. Wynonia Sours on 05/25/2016.  He shouldn't and wife clearly understand that he is a very high risk of adverse cardio pulmonary outcome and the accept this risk, they want to take the chance of undergoing surgery with at least some chance of pain-free and decent quality of life. They agree that if surgery does not go well patient might die or end up with residential hospice, for now plan was to defer surgery with residential hospice anyways.    Subjective:    Pal Kightlinger today has, No headache, No chest pain, No abdominal pain - No Nausea, No new weakness tingling or numbness, No  Cough - SOB.    Assessment  & Plan :     1.Bilateral incarcerated inguinal hernias left more than right. Gen. surgery consulted, after much deliberation patient underwent bilateral inguinal hernia repair surgery by Dr. Harlow Asa on 05/27/2016, seems to have tolerated the procedure well, we'll continue to monitor with supportive care.  2. CAD with chronic ischemic cardiomyopathy causing chronic combined  systolic and diastolic heart failure EF 40%. Currently compensated. We appreciate cardiology input Continue Imdur and statin for secondary prevention.  3. Paroxysmal atrial fibrillation with [name] 2 score overtly 6. Currently in sinus bradycardia, not on any rate controlling medications except digoxin which has been held due to renal insufficiency, digoxin level is stable, xaralto currently on hold due to pending surgery. Platelets are borderline and I doubt bridging aggressively with heparin will provide any meaningful benefit.  4. Dyslipidemia. On statin.  5. Cory Lawrence with primary biliary cirrhosis. Supportive care, continue Urosodiol.  6. Acute renal failure on CK D stage III. Baseline creatinine close to 1.5. Being hydrated renal function improving, will check a baseline bladder scan. Has condom catheter.  7. Advanced dementia, generalized deconditioning - I risk for delirium, continue supportive care with home dementia medications, patient and wife wish him to be DO NOT RESUSCITATE, they do not wish any unnecessary heroics. If stable will go to SNF if declines will go to residential hospice.  8. Essential hypertension. For now as needed IV hydralazine.  9. GERD. On PPI.  10. History of COPD. Stable continue supportive care with oxygen and nebulizer treatments as needed.  11. Chronic low-dose prednisone use. Wife unsure why he takes it, for now 50 mg of IV hydrocortisone daily, if blood pressure drops will change to 100 mg 3 times a day.  12. AOCD. Type screen will transfuse 1 unit of  packed RBC preop on 05/26/2016. Monitor H&H.  13. DM type II. Monitor on sliding scale.  Lab Results  Component Value Date   HGBA1C 5.3 05/21/2016    CBG (last 3)   Recent Labs  05/27/16 0059 05/27/16 0705 05/27/16 1039  GLUCAP 97 88 116*      Family Communication  :  Wife  Code Status :  DNR  Diet : NPO for surgery then heart healthy as tolerated  Disposition Plan  :  Likely SNF    Consults  :  CCS, Pall Care, Cards  Procedures  :    TTE   Normal LV wall thickness with LVEF approximately 40-45%. Mid to basal inferolateral hypokinesis. Grade 1 diastolic dysfunction with normal estimated LV filling pressure. Mild left atrial enlargement. MAC with trivial mitral regurgitation. Mild to moderate calcific aortic stenosis. Trivial tricuspid regurgitation.  Bilateral Inguinal hernia surgery bilateral by Dr. Armandina Gemma on 05/27/2016.   DVT Prophylaxis  :   Heparin    Lab Results  Component Value Date   PLT 96 (L) 05/27/2016    Inpatient Medications  Scheduled Meds: . antiseptic oral rinse  7 mL Mouth Rinse BID  . donepezil  5 mg Oral QHS  . doxazosin  2 mg Oral QHS  . feeding supplement (ENSURE ENLIVE)  237 mL Oral BID BM  . finasteride  5 mg Oral Daily  . fluticasone furoate-vilanterol  1 puff Inhalation Daily  . hydrocortisone sod succinate (SOLU-CORTEF) inj  50 mg Intravenous Daily  . insulin aspart  0-9 Units Subcutaneous Q6H  . isosorbide mononitrate  30 mg Oral Daily  . magnesium sulfate 1 - 4 g bolus IVPB  1 g Intravenous Once  . pantoprazole  40 mg Oral Daily  . polyethylene glycol  17 g Oral Daily  . potassium chloride  10 mEq Intravenous Q1 Hr x 4  . senna-docusate  1 tablet Oral BID  . simvastatin  20 mg Oral QHS  . ursodiol  250 mg Oral BID   Continuous Infusions: . dextrose 5 %  and 0.45 % NaCl with KCl 20 mEq/L     PRN Meds:.acetaminophen **OR** acetaminophen, bisacodyl, diphenhydrAMINE, hydrALAZINE, HYDROcodone-acetaminophen, HYDROmorphone  (DILAUDID) injection, ipratropium-albuterol, metoprolol, ondansetron (ZOFRAN) IV, ondansetron **OR** ondansetron (ZOFRAN) IV  Antibiotics  :    Anti-infectives    Start     Dose/Rate Route Frequency Ordered Stop   05/27/16 0600  vancomycin (VANCOCIN) IVPB 1000 mg/200 mL premix     1,000 mg 200 mL/hr over 60 Minutes Intravenous On call to O.R. 05/26/16 1400 05/27/16 0932         Objective:   Vitals:   05/27/16 1100 05/27/16 1110 05/27/16 1112 05/27/16 1121  BP: (!) 160/62  (!) 164/62 (!) 163/67  Pulse: 77 66 77   Resp: 18 16 20 18   Temp:   97.5 F (36.4 C) 97.6 F (36.4 C)  TempSrc:      SpO2: 100% 100% 100% 99%  Weight:      Height:        Wt Readings from Last 3 Encounters:  05/20/16 54.4 kg (120 lb)  03/24/16 56.9 kg (125 lb 6.4 oz)  02/11/16 57.6 kg (127 lb)     Intake/Output Summary (Last 24 hours) at 05/27/16 1245 Last data filed at 05/27/16 1015  Gross per 24 hour  Intake             1075 ml  Output             1285 ml  Net             -210 ml     Physical Exam  Awake but Pleasantly confused, No new F.N deficits,   [place].AT,PERRAL Supple Neck,No JVD, No cervical lymphadenopathy appriciated.  Symmetrical Chest wall movement, Good air movement bilaterally, CTAB RRR,No Gallops,Rubs or new Murmurs, No Parasternal Heave +ve B.Sounds, Abd Soft, No tenderness, No organomegaly appriciated, No rebound - guarding or rigidity. No Cyanosis, Clubbing or edema, No new Rash or bruise  Bilat Ing hernia    Data Review:    CBC  Recent Labs Lab 05/21/16 0550 05/23/16 0854 05/25/16 0549 05/26/16 0529 05/26/16 1641 05/27/16 0528  WBC 6.1 7.6 6.5 5.8  --  8.2  HGB 8.7* 9.6* 9.0* 8.1* 10.1* 9.8*  HCT 26.3* 28.6* 27.4* 24.0* 29.9* 28.3*  PLT 154 121* 100* 92*  --  96*  MCV 92.6 92.3 91.9 90.9  --  90.1  MCH 30.6 31.0 30.2 30.7  --  31.2  MCHC 33.1 33.6 32.8 33.8  --  34.6  RDW 14.8 14.9 14.8 14.6  --  15.2  LYMPHSABS  --   --  1.0  --   --   --   MONOABS  --    --  0.4  --   --   --   EOSABS  --   --  0.1  --   --   --   BASOSABS  --   --  0.0  --   --   --     Chemistries   Recent Labs Lab 05/21/16 0550 05/23/16 0854 05/25/16 0549 05/26/16 0529 05/27/16 0528  NA 138 138 139 142 139  K 4.1 4.5 4.0 3.7 3.2*  CL 106 105 110 114* 109  CO2 26 24 24 24 24   GLUCOSE 97 96 100* 89 95  BUN 55* 64* 55* 47* 42*  CREATININE 2.35* 3.64* 2.81* 1.65* 1.55*  CALCIUM 8.2* 8.1* 7.5* 7.4* 7.5*  MG  --   --   --   --  1.7  AST  --   --  14*  --   --   ALT  --   --  12*  --   --   ALKPHOS  --   --  71  --   --   BILITOT  --   --  0.7  --   --    ------------------------------------------------------------------------------------------------------------------ No results for input(s): CHOL, HDL, LDLCALC, TRIG, CHOLHDL, LDLDIRECT in the last 72 hours.  Lab Results  Component Value Date   HGBA1C 5.3 05/21/2016   ------------------------------------------------------------------------------------------------------------------ No results for input(s): TSH, T4TOTAL, T3FREE, THYROIDAB in the last 72 hours.  Invalid input(s): FREET3 ------------------------------------------------------------------------------------------------------------------ No results for input(s): VITAMINB12, FOLATE, FERRITIN, TIBC, IRON, RETICCTPCT in the last 72 hours.  Coagulation profile  Recent Labs Lab 05/25/16 1038  INR 1.32    No results for input(s): DDIMER in the last 72 hours.  Cardiac Enzymes No results for input(s): CKMB, TROPONINI, MYOGLOBIN in the last 168 hours.  Invalid input(s): CK ------------------------------------------------------------------------------------------------------------------    Component Value Date/Time   BNP 116.5 (H) 12/31/2015 1720    Micro Results Recent Results (from the past 240 hour(s))  Surgical pcr screen     Status: None   Collection Time: 05/26/16  5:44 PM  Result Value Ref Range Status   MRSA, PCR NEGATIVE  NEGATIVE Final   Staphylococcus aureus NEGATIVE NEGATIVE Final    Comment:        The Xpert SA Assay (FDA approved for NASAL specimens in patients over 50 years of age), is one component of a comprehensive surveillance program.  Test performance has been validated by [facility] for patients greater than or equal to 1 year old. It is not intended to diagnose infection nor to guide or monitor treatment.     Radiology Reports Dg Chest 2 View  Result Date: 05/05/2016 CLINICAL DATA:  Chest pain, left flank pain, fall 5 days ago EXAM: CHEST  2 VIEW COMPARISON:  12/31/2015 FINDINGS: Cardiomediastinal silhouette is stable. Hyperinflation again noted. Stable chronic interstitial prominence. There is left basilar streaky atelectasis or early infiltrate. No pulmonary edema. Osteopenia and degenerative changes thoracic spine. Atherosclerotic calcifications of thoracic aorta. No pneumothorax. IMPRESSION: Hyperinflation. Chronic interstitial prominence. No pulmonary edema. Streaky left basilar atelectasis or early infiltrate. No pneumothorax. Electronically Signed   By: Lahoma Crocker M.D.   On: 05/05/2016 13:47   Dg Ribs Unilateral W/chest Left  Result Date: 05/05/2016 CLINICAL DATA:  Left-sided chest pain, fell 5 days ago EXAM: LEFT RIBS AND CHEST - 3+ VIEW COMPARISON:  Chest x-ray of 12/31/2015 FINDINGS: On the images obtained, no acute left rib fracture is seen. The current images are very obliqued and standard rib detail views would be helpful. The lungs appear to be hyperaerated consistent with an element of emphysema. IMPRESSION: 1. No acute left rib fracture seen on the images obtained. Please see above. 2. Emphysema. Electronically Signed   By: Ivar Drape M.D.   On: 05/05/2016 13:47   Dg Abd Acute W/chest  Result Date: 05/20/2016 CLINICAL DATA:  Left lower abdominal pain for 4-5 days EXAM: DG ABDOMEN ACUTE W/ 1V CHEST COMPARISON:  05/16/2016 FINDINGS: There is no evidence of dilated bowel loops  or free intraperitoneal air. No radiopaque calculi or other significant radiographic abnormality is seen. Heart size and mediastinal contours are within normal limits. The lungs are hyperinflated likely secondary to COPD. IMPRESSION: Negative abdominal radiographs.  No acute cardiopulmonary disease. Electronically Signed   By: Kathreen Devoid   On:  05/20/2016 15:37   Dg Abd Acute W/chest  Result Date: 05/16/2016 CLINICAL DATA:  82 year old male with history of bilateral inguinal hernias and discomfort for the past 3 days EXAM: DG ABDOMEN ACUTE W/ 1V CHEST COMPARISON:  Chest x-ray 05/05/2016. FINDINGS: Lung volumes appear slightly increased and there are mild emphysematous changes noted. Architectural distortion at the base of the left lung, similar to prior studies, favored to reflect areas of chronic scarring. Small bilateral pleural effusions. No evidence of pulmonary edema. No suspicious appearing pulmonary nodules or masses. Heart size is normal. Upper mediastinal contours are within normal limits. Aortic atherosclerosis. Gas and stool are noted throughout the colon extending to the level of the distal rectum. No pathologic dilatation of small bowel is noted. Several nondilated gas-filled loops of small bowel are noted, predominantly in the left side of the abdomen. No pneumoperitoneum. IMPRESSION: 1. Nonobstructive bowel gas pattern. 2. No pneumoperitoneum. 3. Small bilateral pleural effusions. 4. Probable scarring the left lung base. 5. Aortic atherosclerosis. Electronically Signed   By: Vinnie Langton M.D.   On: 05/16/2016 15:32    Time Spent in minutes  30   SINGH,PRASHANT K M.D on 05/27/2016 at 12:45 PM  Between 7am to 7pm - Pager - [phone]  After 7pm go to [email or web address] - password [facility]  [facility] -  Office  [phone]

## 2016-05-27 NOTE — Progress Notes (Signed)
Patient has been accepted to [facility]. Will follow up for disposition when medically stable.

## 2016-05-27 NOTE — Anesthesia Preprocedure Evaluation (Addendum)
Anesthesia Evaluation  Patient identified by MRN, date of birth, ID band Patient awake    Reviewed: Allergy & Precautions, NPO status , Patient's Chart, lab work & pertinent test results  History of Anesthesia Complications Negative for: history of anesthetic complications  Airway Mallampati: II  TM Distance: >3 FB Neck ROM: Full    Dental  (+) Dental Advisory Given, Missing   Pulmonary COPD, former smoker (quit 1997),    breath sounds clear to auscultation       Cardiovascular hypertension, Pt. on medications (-) angina+ CAD and + Past MI  + dysrhythmias Atrial Fibrillation  Rhythm:Irregular Rate:Normal  05/22/16 ECHO: EF 40-45%, valves OK   Neuro/Psych negative neurological ROS     GI/Hepatic Neg liver ROS, GERD  Medicated and Controlled,  Endo/Other  diabetes (diet controlled, glu 88)  Renal/GU Renal InsufficiencyRenal disease (creat 1.55)     Musculoskeletal  (+) Arthritis , Osteoarthritis,    Abdominal   Peds  Hematology  (+) Blood dyscrasia (Xarelto, Hb 9.8, plt 96K), anemia ,   Anesthesia Other Findings   Reproductive/Obstetrics                            Anesthesia Physical Anesthesia Plan  ASA: III  Anesthesia Plan: General   Post-op Pain Management:    Induction: Intravenous  Airway Management Planned: Oral ETT  Additional Equipment:   Intra-op Plan:   Post-operative Plan: Extubation in OR  Informed Consent: I have reviewed the patients History and Physical, chart, labs and discussed the procedure including the risks, benefits and alternatives for the proposed anesthesia with the patient or authorized representative who has indicated his/her understanding and acceptance.   Dental advisory given  Plan Discussed with: CRNA and Surgeon  Anesthesia Plan Comments: (Plan routine monitors, GETA)        Anesthesia Quick Evaluation

## 2016-05-27 NOTE — Anesthesia Postprocedure Evaluation (Signed)
Anesthesia Post Note  Patient: Cory Lawrence  Procedure(s) Performed: Procedure(s) (LRB): LEFT INGUINAL HERNIA REPAIR WITH MESH (Left) INSERTION OF MESH (Left)  Patient location during evaluation: PACU Anesthesia Type: General Level of consciousness: oriented, patient cooperative and sedated Pain management: pain level controlled Vital Signs Assessment: post-procedure vital signs reviewed and stable Respiratory status: spontaneous breathing, nonlabored ventilation, respiratory function stable and patient connected to nasal cannula oxygen Cardiovascular status: blood pressure returned to baseline and stable Postop Assessment: no signs of nausea or vomiting Anesthetic complications: no    Last Vitals:  Vitals:   05/27/16 1110 05/27/16 1112  BP:  (!) 164/62  Pulse: 66 77  Resp: 16 20  Temp:  36.4 C    Last Pain:  Vitals:   05/27/16 1112  TempSrc:   PainSc: Asleep                 Lanyiah Brix,E. Blade Scheff

## 2016-05-27 NOTE — Clinical Social Work Placement (Signed)
   CLINICAL SOCIAL WORK PLACEMENT  NOTE  Date:  05/27/2016  Patient Details  Name: Cory Lawrence MRN: [id number] Date of Birth: December 02, 1924  Clinical Social Work is seeking post-discharge placement for this patient at the [place] level of care (*CSW will initial, date and re-position this form in  chart as items are completed):  Yes   Patient/family provided with [facility] Work Department's list of facilities offering this level of care within the geographic area requested by the patient (or if unable, by the patient's family).  Yes   Patient/family informed of their freedom to choose among providers that offer the needed level of care, that participate in Medicare, Medicaid or managed care program needed by the patient, have an available bed and are willing to accept the patient.  Yes   Patient/family informed of [facility]'s ownership interest in [facility] and [facility], as well as of the fact that they are under no obligation to receive care at these facilities.  PASRR submitted to EDS on 05/27/16     PASRR number received on 05/27/16     Existing PASRR number confirmed on       FL2 transmitted to all facilities in geographic area requested by pt/family on       FL2 transmitted to all facilities within larger geographic area on 05/26/16     Patient informed that his/her managed care company has contracts with or will negotiate with certain facilities, including the following:  [facility], [place]         Patient/family informed of bed offers received.  Patient chooses bed at [place], [place]     Physician recommends and patient chooses bed at [place], [place]    Patient to be transferred to [place], [place] on  .  Patient to be transferred to facility by [facility]     Patient family notified on   of transfer.  Name of family member notified:        PHYSICIAN       Additional Comment:     _______________________________________________ Lia Hopping, LCSW 05/27/2016, 4:45 PM

## 2016-05-27 NOTE — Progress Notes (Signed)
PT Cancellation Note  Patient Details Name: Cory Lawrence MRN: [id number] DOB: 1925-03-11   Cancelled Treatment:     Surgery today.................Marland KitchenHernia MESH    Rica Koyanagi  PTA [facility]  Acute  Rehab Pager      [phone]

## 2016-05-27 NOTE — Progress Notes (Signed)
OT Cancellation Note  Patient Details Name: Cory Lawrence MRN: [id number] DOB: Aug 22, 1925   Cancelled Treatment:    Reason Eval/Treat Not Completed: Other (comment) -- Patient having surgery today. Will follow up with patient per plan of care.  Laney Bagshaw A 05/27/2016, 9:11 AM

## 2016-05-27 NOTE — Transfer of Care (Signed)
Immediate Anesthesia Transfer of Care Note  Patient: Cory Lawrence  Procedure(s) Performed: Procedure(s): LEFT INGUINAL HERNIA REPAIR WITH MESH (Left) INSERTION OF MESH (Left)  Patient Location: PACU  Anesthesia Type:General  Level of Consciousness: awake, alert  and oriented  Airway & Oxygen Therapy: Patient Spontanous Breathing and Patient connected to face mask oxygen  Post-op Assessment: Report given to RN and Post -op Vital signs reviewed and stable  Post vital signs: Reviewed and stable  Last Vitals:  Vitals:   05/26/16 2053 05/27/16 0626  BP: (!) 181/58 (!) 164/55  Pulse: (!) 55 (!) 59  Resp: (!) 22 18  Temp: 36.4 C 36.5 C    Last Pain:  Vitals:   05/27/16 0626  TempSrc: Oral  PainSc:       Patients Stated Pain Goal: 1 ([id number])  Complications: No apparent anesthesia complications

## 2016-05-27 NOTE — Op Note (Signed)
Inguinal Hernia, Open, Procedure Note  Pre-operative Diagnosis:  Left inguinal hernia, reducible  Post-operative Diagnosis: same  Surgeon:  Earnstine Regal, MD, FACS  Anesthesia:  General  Preparation:  Chlora-prep  Estimated Blood Loss: Minimal  Complications:  none  Indications: The patient presented with a left, reducible symptomatic hernia.    Procedure Details  The patient was evaluated in the holding area. All of the patient's questions were answered and the proposed procedure was confirmed. The site of the procedure was properly marked. The patient was taken to the Operating Room, identified by name, and the procedure verified as inguinal hernia repair.  The patient was placed in the supine position and underwent induction of anesthesia. A "Time Out" was performed per routine. The lower abdomen and groin were prepped and draped in the usual aseptic fashion.  After ascertaining that an adequate level of anesthesia had been obtained, an incision was made in the groin with a #10 blade.  Dissection was carried through the subcutaneous tissues and hemostasis obtained with the electrocautery.  A Gelpi retractor was placed for exposure.  The external oblique fascia was incised in line with it's fibers and extended through the external inguinal ring.  The cord structures were dissected out of the inguinal canal and encircled with a Penrose drain.  The floor of the inguinal canal was dissected out.  There was laxity but no fascial defect.  The cord was explored and a large indirect sac containing sigmoid colon was identified and dissected out.  Sac was closed at the neck with interrupted 1-0 Novofil sutures.  The sac was partially excised.  The floor of the inguinal canal was reconstructed with Ethicon Ultrapro mesh cut to the appropriate dimensions.  It was secured to the pubic tubercle with a 2-0 Novafil suture and along the inguinal ligament with a running 2-0 Novafil suture.  Mesh was split  to accommodate the cord structures.  The superior margin of the mesh was secured to the transversalis and internal oblique musculature with interrupted 2-0 Novafil sutures.  The tails of the mesh were overlapped lateral to the cord structures and secured to the inguinal ligament with interrupted 2-0 Novafil sutures to recreate the internal inguinal ring.  Cord structures were returned to the inguinal canal.  Local anesthetic was infiltrated throughout the field.  External oblique fascia was closed with interrupted 3-0 Vicryl sutures.  Subcutaneous tissues were closed with interrupted 3-0 Vicryl sutures.  Skin was anesthetized with local anesthetic, and the skin edges were re-approximated with a running 4-0 Monocryl suture.  Wound was washed and dried and benzoin and steristrips were applied.  A gauze dressing was applied.  Instrument, sponge, and needle counts were correct prior to closure and at the conclusion of the case.  The patient tolerated the procedure well.  The patient was awakened from anesthesia and brought to the recovery room in stable condition.  Earnstine Regal, MD, [facility] Office: [phone]

## 2016-05-27 NOTE — Progress Notes (Signed)
Nutrition Follow-up  DOCUMENTATION CODES:   Non-severe (moderate) malnutrition in context of acute illness/injury  INTERVENTION:  - Continue CLD and advance diet as medically feasible. - Continue Ensure Enlive BID for when diet able to be advanced. - RD will continue to monitor for additional nutrition-related needs.  NUTRITION DIAGNOSIS:   Inadequate oral intake related to inability to eat, acute illness as evidenced by meal completion < 50%. -ongoing.  GOAL:   Patient will meet greater than or equal to 90% of their needs -unmet.  MONITOR:   PO intake, Diet advancement, Supplement acceptance, Weight trends, Labs, Skin, I & O's  ASSESSMENT:   82 y.o. male with a complex PMH detailed below presented to the ED for the second time in the last several days with severe recurrent LLQ abdominal pain related to bilateral inguinal hernias complicated by chronic constipation and generalized weakness from poor oral intake.  The patient and his wife report that 5-6 days ago the patient picked up something at home and afterwards had severe pain mostly on the left side lower quadrant of abdomen. He reports that his pain is in his inguinal region and that he noted that his hernia had protruded and felt hard like a baseball. He was seen 4 days ago in the emergency department for this issue and per notes had his hernia successfully reduced and was sent  Home with outpatient surgical follow-up. The patient and his wife report that it came right back out and the patient has been in severe pain over the last few days at home. He has had very little appetite and has not been able to eat much. He has only had 1 bowel movement over this time. His wife said she had to give him multiple laxatives in order for him to have a bowel movement.  7/28 No new weight since admission. Per chart review, diet changes as follows:  7/21 @ 1820: Heart Healthy 7/26 @ 1145: Dysphagia 3 with Heart Healthy restriction 7/27 @  [id number]: NPO 7/27 @ 1400: Regular 7/28 @ 0001: NPO 7/28 @ 1125: CLD  Chart review indicates pt consumed 50% of lunch 7/24 and 10% of breakfast, 50% of dinner 7/25. Pt underwent open inguinal hernia repair earlier today. He was sleeping soundly at time of RD visit. Notes indicate that pt will either d/c to SNF or residential hospice dependent on medical course following today's procedure.   Order associated with PO diet is for diet advancement as tolerated. Ensure Enlive order currently in for BID; will continue this order and not order Boost Breeze for CLD at this time.   Medications reviewed; 50 mg IV Solu-Cortef/day, sliding scale Novolog, 1 g Mg sulfate x1 dose today, PRN Zofran, 40 mg oral Protonix/day, 10 mEq IV KCl x4 runs today. Labs reviewed; CBGs: 88-116 mg/dL today, K: 3.2 mmol/L, BUN: 42 mg/dL, creatinine: 1.55 mg/dL, Ca: 7.5 mg/dL, GFR: 38 mL/min. IVF: D5-1/2 NS-20 mEq KCl @ 50 mL/hr (204 kcal).   7/23 - Patient seen as bedside with RN and MD.   - Patient presented with severe hernia requiring surgical repair.   - While clearance for surgery was underway pt develop new onset confusion.   - Wife has been to hospital frequently, however not available during visit today.   - RN states wife cuts food and feeds husband.   - Not sure if there may be an underlying concern/component of dysphagia vs. chewing difficulties.   - RD completed physical exam and assessed weight history.   -  Patient meets criteria for moderate malnutrition. Interventions limited by confusion today.     Diet Order:  Diet clear liquid Room service appropriate? Yes; Fluid consistency: Thin  Skin:  Wound (see comment) (L groin incision from 05/27/16)  Last BM:  7/27  Height:   Ht Readings from Last 1 Encounters:  05/20/16 5\' 5"  (1.651 m)    Weight:   Wt Readings from Last 1 Encounters:  05/20/16 120 lb (54.4 kg)    Ideal Body Weight:     BMI:  Body mass index is 19.97 kg/m.  Estimated Nutritional  Needs:   Kcal:  1750-1900  Protein:  60g  Fluid:  2 L/day  EDUCATION NEEDS:   Education needs no appropriate at this time    Jarome Matin, MS, RD, LDN Inpatient Clinical Dietitian Pager # [phone] After hours/weekend pager # [phone]

## 2016-05-28 ENCOUNTER — Encounter (HOSPITAL_COMMUNITY): Payer: Self-pay | Admitting: *Deleted

## 2016-05-28 ENCOUNTER — Inpatient Hospital Stay (HOSPITAL_COMMUNITY): Payer: Medicare Other

## 2016-05-28 LAB — CBC
HEMATOCRIT: 30.3 % — AB (ref 39.0–52.0)
HEMOGLOBIN: 10.3 g/dL — AB (ref 13.0–17.0)
MCH: 31 pg (ref 26.0–34.0)
MCHC: 34 g/dL (ref 30.0–36.0)
MCV: 91.3 fL (ref 78.0–100.0)
Platelets: 99 10*3/uL — ABNORMAL LOW (ref 150–400)
RBC: 3.32 MIL/uL — ABNORMAL LOW (ref 4.22–5.81)
RDW: 15.2 % (ref 11.5–15.5)
WBC: 10.9 10*3/uL — AB (ref 4.0–10.5)

## 2016-05-28 LAB — BASIC METABOLIC PANEL
ANION GAP: 5 (ref 5–15)
BUN: 41 mg/dL — ABNORMAL HIGH (ref 6–20)
CALCIUM: 7.5 mg/dL — AB (ref 8.9–10.3)
CO2: 25 mmol/L (ref 22–32)
CREATININE: 1.67 mg/dL — AB (ref 0.61–1.24)
Chloride: 107 mmol/L (ref 101–111)
GFR, EST AFRICAN AMERICAN: 40 mL/min — AB (ref >60)
GFR, EST NON AFRICAN AMERICAN: 35 mL/min — AB (ref >60)
Glucose, Bld: 109 mg/dL — ABNORMAL HIGH (ref 65–99)
Potassium: 4.2 mmol/L (ref 3.5–5.1)
Sodium: 137 mmol/L (ref 135–145)

## 2016-05-28 LAB — GLUCOSE, CAPILLARY
GLUCOSE-CAPILLARY: 104 mg/dL — AB (ref 65–99)
GLUCOSE-CAPILLARY: 161 mg/dL — AB (ref 65–99)
GLUCOSE-CAPILLARY: 164 mg/dL — AB (ref 65–99)
GLUCOSE-CAPILLARY: 166 mg/dL — AB (ref 65–99)

## 2016-05-28 LAB — MAGNESIUM: MAGNESIUM: 1.9 mg/dL (ref 1.7–2.4)

## 2016-05-28 MED ORDER — METOPROLOL TARTRATE 25 MG PO TABS
12.5000 mg | ORAL_TABLET | Freq: Two times a day (BID) | ORAL | Status: DC
  Administered 2016-05-28: 12.5 mg via ORAL
  Filled 2016-05-28: qty 1

## 2016-05-28 MED ORDER — MAGNESIUM SULFATE IN D5W 1-5 GM/100ML-% IV SOLN
1.0000 g | Freq: Once | INTRAVENOUS | Status: AC
  Administered 2016-05-28: 1 g via INTRAVENOUS
  Filled 2016-05-28: qty 100

## 2016-05-28 MED ORDER — DIGOXIN 125 MCG PO TABS
0.1250 mg | ORAL_TABLET | Freq: Every day | ORAL | Status: DC
  Administered 2016-05-28 – 2016-06-02 (×5): 0.125 mg via ORAL
  Filled 2016-05-28 (×6): qty 1

## 2016-05-28 MED ORDER — HEPARIN SODIUM (PORCINE) 5000 UNIT/ML IJ SOLN
5000.0000 [IU] | Freq: Three times a day (TID) | INTRAMUSCULAR | Status: AC
  Administered 2016-05-28 – 2016-05-29 (×3): 5000 [IU] via SUBCUTANEOUS
  Filled 2016-05-28 (×3): qty 1

## 2016-05-28 MED ORDER — HALOPERIDOL LACTATE 2 MG/ML PO CONC
1.0000 mg | Freq: Four times a day (QID) | ORAL | Status: DC | PRN
  Administered 2016-06-01: 1 mg via ORAL
  Filled 2016-05-28 (×2): qty 0.5

## 2016-05-28 MED ORDER — KCL IN DEXTROSE-NACL 20-5-0.2 MEQ/L-%-% IV SOLN
INTRAVENOUS | Status: DC
  Administered 2016-05-28: 16:00:00 via INTRAVENOUS
  Filled 2016-05-28 (×2): qty 1000

## 2016-05-28 MED ORDER — RIVAROXABAN 15 MG PO TABS
15.0000 mg | ORAL_TABLET | Freq: Every day | ORAL | Status: DC
  Administered 2016-05-29 – 2016-06-01 (×4): 15 mg via ORAL
  Filled 2016-05-28 (×4): qty 1

## 2016-05-28 MED ORDER — INSULIN ASPART 100 UNIT/ML ~~LOC~~ SOLN
0.0000 [IU] | SUBCUTANEOUS | Status: DC
  Administered 2016-05-29 (×4): 1 [IU] via SUBCUTANEOUS
  Administered 2016-05-30: 2 [IU] via SUBCUTANEOUS
  Administered 2016-05-30 (×3): 1 [IU] via SUBCUTANEOUS
  Administered 2016-05-31 (×2): 2 [IU] via SUBCUTANEOUS
  Administered 2016-06-01: 1 [IU] via SUBCUTANEOUS
  Administered 2016-06-01 (×2): 2 [IU] via SUBCUTANEOUS

## 2016-05-28 NOTE — Progress Notes (Signed)
Physical Therapy Treatment Patient Details Name: Cory Lawrence MRN: [id number] DOB: 1925-05-31 Today's Date: 05/28/2016    History of Present Illness 82 yo male admitted with LLQ pain due to hernia. hx of COPD, HTN, DM, CAD, PAF, arthritis.     PT Comments    Pt not able to ambulate today.  He presents with what appears to be paranoia about PT's "motives". Explained purpose of PT and that he was getting weaker, and pt verbalized agreement.  He then agreed to sit EOB and was educated on proper body mechanics, but then he refused and was agitated with PT and tech.  Nursing informed. Con't to recommend SNF.  Follow Up Recommendations  SNF     Equipment Recommendations  None recommended by PT    Recommendations for Other Services       Precautions / Restrictions Precautions Precautions: Fall    Mobility  Bed Mobility               General bed mobility comments: After much encouragement and pt refusing ambulation, pt agreed to sit EOB and PT educated on proper body mechanics due to recent surgery and then pt closed his eyes and woudn't respond.  When asked if he was playing possum, pt nodded his head.  Attempted to get him to bend his knees and he would not.  Transfers                    Ambulation/Gait                 Stairs            Wheelchair Mobility    Modified Rankin (Stroke Patients Only)       Balance                                    Cognition Arousal/Alertness: Awake/alert Behavior During Therapy: Agitated Overall Cognitive Status: Impaired/Different from baseline Area of Impairment: Awareness Orientation Level: Disoriented to;Situation         Awareness: Intellectual   General Comments: Pt paranoid about therapist and "motives"    Exercises      General Comments        Pertinent Vitals/Pain Pain Assessment: Faces Faces Pain Scale: Hurts a little bit Pain Location: abdomen Pain  Descriptors / Indicators: Grimacing Pain Intervention(s): Limited activity within patient's tolerance    Home Living                      Prior Function            PT Goals (current goals can now be found in the care plan section) Acute Rehab PT Goals Patient Stated Goal: none stated PT Goal Formulation: With patient Time For Goal Achievement: 06/04/16 Potential to Achieve Goals: Fair Progress towards PT goals: Not progressing toward goals - comment (Pt refusing OOB or therex)    Frequency  Min 3X/week    PT Plan Current plan remains appropriate    Co-evaluation             End of Session   Activity Tolerance: Treatment limited secondary to agitation Patient left: in bed;with bed alarm set;with family/visitor present;Other (comment) (friend present, wife went down to eat lunch)     Time: [id number] PT Time Calculation (min) (ACUTE ONLY): 11 min  Charges:  $Therapeutic Activity: 8-22 mins  G Codes:      Cory Lawrence LUBECK 05/28/2016, 1:15 PM

## 2016-05-28 NOTE — Progress Notes (Signed)
Occupational Therapy Treatment Patient Details Name: Cory Lawrence MRN: [id number] DOB: January 24, 1925 Today's Date: 05/28/2016    History of present illness 82 yo male admitted with LLQ pain due to hernia. hx of COPD, HTN, DM, CAD, PAF, arthritis.    OT comments  Pt seen this session to address self feeding. Pt issued built up tubing to use for utensils. Wife educated on use. Pt able to self feed with set up and S. Will continue to follow to address established goals and facilitate safe DC to next venue of care.   Follow Up Recommendations  SNF;Supervision/Assistance - 24 hour    Equipment Recommendations  Other (comment) (TBA at SNF)    Recommendations for Other Services      Precautions / Restrictions Precautions Precautions: Fall Restrictions Weight Bearing Restrictions: No              ADL   Eating/Feeding: Supervision/ safety;Set up (Pt has order to advance diet as tolerated. )   Grooming: Set up;Supervision/safety;Wash/dry face;Wash/dry hands                                 General ADL Comments: Addressed self feeding this session. Pt given built up tubing which increases pt's ability to hold utensils. Pt able to feed self applesauce with built up utensils. Wife present for education and asking about his "tremors", Disucssed possibly trying weighted utensils  and cup to assess if that helps. Discussed how OT in SNF would also address ADL       Vision                                Cognition   Behavior During Therapy: Flat affect Overall Cognitive Status: Impaired/Different from baseline (wife states pt is not at baseline)                       Extremity/Trunk Assessment   BUE generalized weakness            Exercises  encouraged BUE AROM          General Comments      Pertinent Vitals/ Pain       Pain Assessment: Faces Faces Pain Scale: Hurts a little bit Pain Location: abdomen Pain Descriptors /  Indicators: Grimacing Pain Intervention(s): Limited activity within patient's tolerance  Home Living                                          Prior Functioning/Environment              Frequency Min 2X/week     Progress Toward Goals  OT Goals(current goals can now be found in the care plan section)  Progress towards OT goals: Progressing toward goals  Acute Rehab OT Goals Patient Stated Goal: none stated OT Goal Formulation: Patient unable to participate in goal setting Time For Goal Achievement: 06/07/16 Potential to Achieve Goals: Fair ADL Goals Pt Will Perform Eating: with min assist;with adaptive utensils;bed level;sitting Pt Will Perform Grooming: with min assist;with adaptive equipment;sitting;bed level Pt Will Transfer to Toilet: with supervision;bedside commode  Plan Discharge plan remains appropriate    Co-evaluation                 End  of Session     Activity Tolerance Patient tolerated treatment well   Patient Left in bed;with call bell/phone within reach;with family/visitor present   Nurse Communication Other (comment) (use of AE for feeding)        Time: [id number] OT Time Calculation (min): 15 min  Charges: OT General Charges $OT Visit: 1 Procedure OT Treatments $Self Care/Home Management : 8-22 mins  Cory Lawrence,Cory Lawrence 05/28/2016, 12:13 PM  [facility], OT/L  [phone] 05/28/2016

## 2016-05-28 NOTE — Progress Notes (Signed)
PROGRESS NOTE                                                                                                                                                                                                             Patient Demographics:    Cory Lawrence, is a 82 y.o. male, DOB - 06/09/1925, [id number]  Admit date - 05/20/2016   Admitting Physician Murlean Iba, MD  Outpatient Primary MD for the patient is [name], [name], MD  LOS - 5  Chief Complaint  Patient presents with  . Hernia       Brief Narrative   82 year old Caucasian male with history of advanced dementia, paroxysmal atrial fibrillation [name] 2 score of at least 6, on xaralto, COPD, BPH, bilateral inguinal hernias, CAD, chronic combined systolic and diastolic CHF EF [id number], Nash, CK D stage III, DM type II, primary biliary cirrhosis who was admitted to the hospital with abdominal pain due to incarcerated hernias left more than right. He was also found to be in acute renal failure. General surgery palliative care following. Patient is DO NOT RESUSCITATE. Family is very reasonable, however in my opinion patient will have no chance of survival are good quality of life if we do not proceed with surgical correction of his incarcerated hernia. I have discussed this with general surgeon Dr. Wynonia Sours on 05/25/2016.  He shouldn't and wife clearly understand that he is a very high risk of adverse cardio pulmonary outcome and the accept this risk, they want to take the chance of undergoing surgery with at least some chance of pain-free and decent quality of life. They agree that if surgery does not go well patient might die or end up with residential hospice, for now plan was to defer surgery with residential hospice anyways.    Subjective:    Cory Lawrence today has, No headache, No chest pain, No abdominal pain - No Nausea, No new weakness tingling or numbness, No  Cough - SOB.    Assessment  & Plan :     1.Bilateral incarcerated inguinal hernias left more than right. Gen. surgery consulted, after much deliberation patient underwent bilateral inguinal hernia repair surgery by Dr. Harlow Asa on 05/27/2016, seems to have tolerated the procedure well, we'll continue to monitor with supportive care. IS in place, will need SNF.  2. CAD with  chronic ischemic cardiomyopathy causing chronic combined systolic and diastolic heart failure EF 40%. Currently compensated. We appreciate cardiology input Continue Imdur and statin for secondary prevention.  3. Paroxysmal atrial fibrillation with [name] 2 score overtly 6. Currently in sinus bradycardia, not on any rate controlling medications except digoxin which has been held due to renal insufficiency, digoxin level is stable, xaralto currently on hold due to pending surgery. Platelets are borderline and I doubt bridging aggressively with heparin will provide any meaningful benefit.  4. Dyslipidemia. On statin.  5. NASH with primary biliary cirrhosis. Supportive care, continue Urosodiol.  6. Acute renal failure on CK D stage III. Baseline creatinine close to 1.4. Being hydrated renal function improving, will  Monitor bladder scan. Has condom catheter.  7. Advanced dementia, generalized deconditioning - I risk for delirium, continue supportive care with home dementia medications, patient and wife wish him to be DO NOT RESUSCITATE, they do not wish any unnecessary heroics. If stable will go to SNF if declines will go to residential hospice.  8. Essential hypertension. For now as needed IV hydralazine.  9. GERD. On PPI.  10. History of COPD. Stable continue supportive care with oxygen and nebulizer treatments as needed.  11. Chronic low-dose prednisone use. Wife unsure why he takes it, for now 50 mg of IV hydrocortisone daily, if blood pressure drops will change to 100 mg 3 times a day.  12. AOCD. Type screen will  transfuse 1 unit of packed RBC preop on 05/26/2016. Monitor H&H.  13. DM type II. Monitor on sliding scale.  Lab Results  Component Value Date   HGBA1C 5.3 05/21/2016    CBG (last 3)   Recent Labs  05/27/16 1856 05/28/16 0015 05/28/16 0654  GLUCAP 136* 166* 104*      Family Communication  :  Wife  Code Status :  DNR  Diet : NPO for surgery then heart healthy as tolerated  Disposition Plan  :  Likely SNF    Consults  :  CCS, Pall Care, Cards  Procedures  :    TTE   Normal LV wall thickness with LVEF approximately 40-45%. Mid to basal inferolateral hypokinesis. Grade 1 diastolic dysfunction with normal estimated LV filling pressure. Mild left atrial enlargement. MAC with trivial mitral regurgitation. Mild to moderate calcific aortic stenosis. Trivial tricuspid regurgitation.  Bilateral Inguinal hernia surgery bilateral by Dr. Armandina Gemma on 05/27/2016.   DVT Prophylaxis  :   Heparin    Lab Results  Component Value Date   PLT 99 (L) 05/28/2016    Inpatient Medications  Scheduled Meds: . antiseptic oral rinse  7 mL Mouth Rinse BID  . donepezil  5 mg Oral QHS  . doxazosin  2 mg Oral QHS  . feeding supplement (ENSURE ENLIVE)  237 mL Oral BID BM  . finasteride  5 mg Oral Daily  . fluticasone furoate-vilanterol  1 puff Inhalation Daily  . hydrocortisone sod succinate (SOLU-CORTEF) inj  50 mg Intravenous Daily  . insulin aspart  0-9 Units Subcutaneous Q6H  . isosorbide mononitrate  30 mg Oral Daily  . pantoprazole  40 mg Oral Daily  . polyethylene glycol  17 g Oral Daily  . senna-docusate  1 tablet Oral BID  . simvastatin  20 mg Oral QHS  . ursodiol  250 mg Oral BID   Continuous Infusions: . dextrose 5 % and 0.2 % NaCl with KCl 20 mEq     PRN Meds:.acetaminophen **OR** [DISCONTINUED] acetaminophen, bisacodyl, diphenhydrAMINE, hydrALAZINE, HYDROcodone-acetaminophen, HYDROmorphone (  DILAUDID) injection, ipratropium-albuterol, metoprolol, ondansetron (ZOFRAN) IV,  [DISCONTINUED] ondansetron **OR** ondansetron (ZOFRAN) IV  Antibiotics  :    Anti-infectives    Start     Dose/Rate Route Frequency Ordered Stop   05/27/16 0600  vancomycin (VANCOCIN) IVPB 1000 mg/200 mL premix     1,000 mg 200 mL/hr over 60 Minutes Intravenous On call to O.R. 05/26/16 1400 05/27/16 0932         Objective:   Vitals:   05/27/16 1121 05/27/16 1257 05/27/16 2200 05/28/16 0649  BP: (!) 163/67 (!) 140/52 140/63 (!) 133/55  Pulse:  75 80 83  Resp: 18 18 20 16   Temp: 97.6 F (36.4 C) 97.4 F (36.3 C) 97.9 F (36.6 C) 97.8 F (36.6 C)  TempSrc:  Oral Oral Oral  SpO2: 99% 100% 98% 99%  Weight:      Height:        Wt Readings from Last 3 Encounters:  05/20/16 54.4 kg (120 lb)  03/24/16 56.9 kg (125 lb 6.4 oz)  02/11/16 57.6 kg (127 lb)     Intake/Output Summary (Last 24 hours) at 05/28/16 1043 Last data filed at 05/28/16 0650  Gross per 24 hour  Intake           174.17 ml  Output              750 ml  Net          -575.83 ml     Physical Exam  Awake but Pleasantly confused, No new F.N deficits,   [place].AT,PERRAL Supple Neck,No JVD, No cervical lymphadenopathy appriciated.  Symmetrical Chest wall movement, Good air movement bilaterally, CTAB RRR,No Gallops,Rubs or new Murmurs, No Parasternal Heave +ve B.Sounds, Abd Soft, No tenderness, No organomegaly appriciated, No rebound - guarding or rigidity. No Cyanosis, Clubbing or edema, No new Rash or bruise  Bilat Ing hernia surgery scar under bandage.    Data Review:    CBC  Recent Labs Lab 05/23/16 0854 05/25/16 0549 05/26/16 0529 05/26/16 1641 05/27/16 0528 05/28/16 0508  WBC 7.6 6.5 5.8  --  8.2 10.9*  HGB 9.6* 9.0* 8.1* 10.1* 9.8* 10.3*  HCT 28.6* 27.4* 24.0* 29.9* 28.3* 30.3*  PLT 121* 100* 92*  --  96* 99*  MCV 92.3 91.9 90.9  --  90.1 91.3  MCH 31.0 30.2 30.7  --  31.2 31.0  MCHC 33.6 32.8 33.8  --  34.6 34.0  RDW 14.9 14.8 14.6  --  15.2 15.2  LYMPHSABS  --  1.0  --   --   --   --     MONOABS  --  0.4  --   --   --   --   EOSABS  --  0.1  --   --   --   --   BASOSABS  --  0.0  --   --   --   --     Chemistries   Recent Labs Lab 05/23/16 0854 05/25/16 0549 05/26/16 0529 05/27/16 0528 05/28/16 0508  NA 138 139 142 139 137  K 4.5 4.0 3.7 3.2* 4.2  CL 105 110 114* 109 107  CO2 24 24 24 24 25   GLUCOSE 96 100* 89 95 109*  BUN 64* 55* 47* 42* 41*  CREATININE 3.64* 2.81* 1.65* 1.55* 1.67*  CALCIUM 8.1* 7.5* 7.4* 7.5* 7.5*  MG  --   --   --  1.7 1.9  AST  --  14*  --   --   --  ALT  --  12*  --   --   --   ALKPHOS  --  71  --   --   --   BILITOT  --  0.7  --   --   --    ------------------------------------------------------------------------------------------------------------------ No results for input(s): CHOL, HDL, LDLCALC, TRIG, CHOLHDL, LDLDIRECT in the last 72 hours.  Lab Results  Component Value Date   HGBA1C 5.3 05/21/2016   ------------------------------------------------------------------------------------------------------------------ No results for input(s): TSH, T4TOTAL, T3FREE, THYROIDAB in the last 72 hours.  Invalid input(s): FREET3 ------------------------------------------------------------------------------------------------------------------ No results for input(s): VITAMINB12, FOLATE, FERRITIN, TIBC, IRON, RETICCTPCT in the last 72 hours.  Coagulation profile  Recent Labs Lab 05/25/16 1038  INR 1.32    No results for input(s): DDIMER in the last 72 hours.  Cardiac Enzymes No results for input(s): CKMB, TROPONINI, MYOGLOBIN in the last 168 hours.  Invalid input(s): CK ------------------------------------------------------------------------------------------------------------------    Component Value Date/Time   BNP 116.5 (H) 12/31/2015 1720    Micro Results Recent Results (from the past 240 hour(s))  Surgical pcr screen     Status: None   Collection Time: 05/26/16  5:44 PM  Result Value Ref Range Status   MRSA, PCR  NEGATIVE NEGATIVE Final   Staphylococcus aureus NEGATIVE NEGATIVE Final    Comment:        The Xpert SA Assay (FDA approved for NASAL specimens in patients over 28 years of age), is one component of a comprehensive surveillance program.  Test performance has been validated by [facility] for patients greater than or equal to 58 year old. It is not intended to diagnose infection nor to guide or monitor treatment.     Radiology Reports Dg Chest 2 View  Result Date: 05/05/2016 CLINICAL DATA:  Chest pain, left flank pain, fall 5 days ago EXAM: CHEST  2 VIEW COMPARISON:  12/31/2015 FINDINGS: Cardiomediastinal silhouette is stable. Hyperinflation again noted. Stable chronic interstitial prominence. There is left basilar streaky atelectasis or early infiltrate. No pulmonary edema. Osteopenia and degenerative changes thoracic spine. Atherosclerotic calcifications of thoracic aorta. No pneumothorax. IMPRESSION: Hyperinflation. Chronic interstitial prominence. No pulmonary edema. Streaky left basilar atelectasis or early infiltrate. No pneumothorax. Electronically Signed   By: Lahoma Crocker M.D.   On: 05/05/2016 13:47   Dg Ribs Unilateral W/chest Left  Result Date: 05/05/2016 CLINICAL DATA:  Left-sided chest pain, fell 5 days ago EXAM: LEFT RIBS AND CHEST - 3+ VIEW COMPARISON:  Chest x-ray of 12/31/2015 FINDINGS: On the images obtained, no acute left rib fracture is seen. The current images are very obliqued and standard rib detail views would be helpful. The lungs appear to be hyperaerated consistent with an element of emphysema. IMPRESSION: 1. No acute left rib fracture seen on the images obtained. Please see above. 2. Emphysema. Electronically Signed   By: Ivar Drape M.D.   On: 05/05/2016 13:47   Dg Abd Acute W/chest  Result Date: 05/20/2016 CLINICAL DATA:  Left lower abdominal pain for 4-5 days EXAM: DG ABDOMEN ACUTE W/ 1V CHEST COMPARISON:  05/16/2016 FINDINGS: There is no evidence of dilated  bowel loops or free intraperitoneal air. No radiopaque calculi or other significant radiographic abnormality is seen. Heart size and mediastinal contours are within normal limits. The lungs are hyperinflated likely secondary to COPD. IMPRESSION: Negative abdominal radiographs.  No acute cardiopulmonary disease. Electronically Signed   By: Kathreen Devoid   On: 05/20/2016 15:37   Dg Abd Acute W/chest  Result Date: 05/16/2016 CLINICAL DATA:  82 year old male  with history of bilateral inguinal hernias and discomfort for the past 3 days EXAM: DG ABDOMEN ACUTE W/ 1V CHEST COMPARISON:  Chest x-ray 05/05/2016. FINDINGS: Lung volumes appear slightly increased and there are mild emphysematous changes noted. Architectural distortion at the base of the left lung, similar to prior studies, favored to reflect areas of chronic scarring. Small bilateral pleural effusions. No evidence of pulmonary edema. No suspicious appearing pulmonary nodules or masses. Heart size is normal. Upper mediastinal contours are within normal limits. Aortic atherosclerosis. Gas and stool are noted throughout the colon extending to the level of the distal rectum. No pathologic dilatation of small bowel is noted. Several nondilated gas-filled loops of small bowel are noted, predominantly in the left side of the abdomen. No pneumoperitoneum. IMPRESSION: 1. Nonobstructive bowel gas pattern. 2. No pneumoperitoneum. 3. Small bilateral pleural effusions. 4. Probable scarring the left lung base. 5. Aortic atherosclerosis. Electronically Signed   By: Vinnie Langton M.D.   On: 05/16/2016 15:32    Time Spent in minutes  30   SINGH,PRASHANT K M.D on 05/28/2016 at 10:43 AM  Between 7am to 7pm - Pager - [phone]  After 7pm go to [email or web address] - password [facility]  [facility] -  Office  [phone]

## 2016-05-28 NOTE — Progress Notes (Signed)
Patient had 8 beat run Bigeminy PVCs. Text page sent to Dr. Candiss Norse. New orders obtained.

## 2016-05-28 NOTE — Progress Notes (Signed)
Patient ID: Cory Lawrence, male   DOB: 23-Aug-1925, 82 y.o.   MRN: [id number] [facility] Progress Note:   1 Day Post-Op  Subjective: Mental status is mildly confused.  No pain complaints Objective: Vital signs in last 24 hours: Temp:  [97.4 F (36.3 C)-97.9 F (36.6 C)] 97.8 F (36.6 C) (07/29 0649) Pulse Rate:  [66-92] 83 (07/29 0649) Resp:  [16-20] 16 (07/29 0649) BP: (133-164)/(52-70) 133/55 (07/29 0649) SpO2:  [98 %-100 %] 99 % (07/29 0649)  Intake/Output from previous day: 07/28 0701 - 07/29 0700 In: 674.2 [I.V.:674.2] Out: 960 [Urine:950; Blood:10] Intake/Output this shift: No intake/output data recorded.  Physical Exam: Work of breathing is normal for age.  LIH dressing in place.  Nontender  Lab Results:  Results for orders placed or performed during the hospital encounter of 05/20/16 (from the past 48 hour(s))  Glucose, capillary     Status: Abnormal   Collection Time: 05/26/16 11:41 AM  Result Value Ref Range   Glucose-Capillary 114 (H) 65 - 99 mg/dL  Glucose, capillary     Status: Abnormal   Collection Time: 05/26/16  4:10 PM  Result Value Ref Range   Glucose-Capillary 122 (H) 65 - 99 mg/dL  Hemoglobin and hematocrit, blood     Status: Abnormal   Collection Time: 05/26/16  4:41 PM  Result Value Ref Range   Hemoglobin 10.1 (L) 13.0 - 17.0 g/dL   HCT 29.9 (L) 39.0 - 52.0 %  Surgical pcr screen     Status: None   Collection Time: 05/26/16  5:44 PM  Result Value Ref Range   MRSA, PCR NEGATIVE NEGATIVE   Staphylococcus aureus NEGATIVE NEGATIVE    Comment:        The Xpert SA Assay (FDA approved for NASAL specimens in patients over 32 years of age), is one component of a comprehensive surveillance program.  Test performance has been validated by [facility] for patients greater than or equal to 45 year old. It is not intended to diagnose infection nor to guide or monitor treatment.   Glucose, capillary     Status: Abnormal   Collection  Time: 05/26/16  8:57 PM  Result Value Ref Range   Glucose-Capillary 138 (H) 65 - 99 mg/dL  Glucose, capillary     Status: None   Collection Time: 05/27/16 12:59 AM  Result Value Ref Range   Glucose-Capillary 97 65 - 99 mg/dL  CBC     Status: Abnormal   Collection Time: 05/27/16  5:28 AM  Result Value Ref Range   WBC 8.2 4.0 - 10.5 K/uL   RBC 3.14 (L) 4.22 - 5.81 MIL/uL   Hemoglobin 9.8 (L) 13.0 - 17.0 g/dL   HCT 28.3 (L) 39.0 - 52.0 %   MCV 90.1 78.0 - 100.0 fL   MCH 31.2 26.0 - 34.0 pg   MCHC 34.6 30.0 - 36.0 g/dL   RDW 15.2 11.5 - 15.5 %   Platelets 96 (L) 150 - 400 K/uL    Comment: CONSISTENT WITH PREVIOUS RESULT  Basic metabolic panel     Status: Abnormal   Collection Time: 05/27/16  5:28 AM  Result Value Ref Range   Sodium 139 135 - 145 mmol/L   Potassium 3.2 (L) 3.5 - 5.1 mmol/L   Chloride 109 101 - 111 mmol/L   CO2 24 22 - 32 mmol/L   Glucose, Bld 95 65 - 99 mg/dL   BUN 42 (H) 6 - 20 mg/dL   Creatinine, Ser 1.55 (H)  0.61 - 1.24 mg/dL   Calcium 7.5 (L) 8.9 - 10.3 mg/dL   GFR calc non Af Amer 38 (L) >60 mL/min   GFR calc Af Amer 44 (L) >60 mL/min    Comment: (NOTE) The eGFR has been calculated using the CKD EPI equation. This calculation has not been validated in all clinical situations. eGFR's persistently <60 mL/min signify possible Chronic Kidney Disease.    Anion gap 6 5 - 15  Magnesium     Status: None   Collection Time: 05/27/16  5:28 AM  Result Value Ref Range   Magnesium 1.7 1.7 - 2.4 mg/dL  Glucose, capillary     Status: None   Collection Time: 05/27/16  7:05 AM  Result Value Ref Range   Glucose-Capillary 88 65 - 99 mg/dL  Glucose, capillary     Status: Abnormal   Collection Time: 05/27/16 10:39 AM  Result Value Ref Range   Glucose-Capillary 116 (H) 65 - 99 mg/dL  Glucose, capillary     Status: Abnormal   Collection Time: 05/27/16  2:59 PM  Result Value Ref Range   Glucose-Capillary 120 (H) 65 - 99 mg/dL  Glucose, capillary     Status: Abnormal    Collection Time: 05/27/16  6:56 PM  Result Value Ref Range   Glucose-Capillary 136 (H) 65 - 99 mg/dL   Comment 1 Notify RN   Glucose, capillary     Status: Abnormal   Collection Time: 05/28/16 12:15 AM  Result Value Ref Range   Glucose-Capillary 166 (H) 65 - 99 mg/dL  Magnesium     Status: None   Collection Time: 05/28/16  5:08 AM  Result Value Ref Range   Magnesium 1.9 1.7 - 2.4 mg/dL  CBC     Status: Abnormal   Collection Time: 05/28/16  5:08 AM  Result Value Ref Range   WBC 10.9 (H) 4.0 - 10.5 K/uL   RBC 3.32 (L) 4.22 - 5.81 MIL/uL   Hemoglobin 10.3 (L) 13.0 - 17.0 g/dL   HCT 30.3 (L) 39.0 - 52.0 %   MCV 91.3 78.0 - 100.0 fL   MCH 31.0 26.0 - 34.0 pg   MCHC 34.0 30.0 - 36.0 g/dL   RDW 15.2 11.5 - 15.5 %   Platelets 99 (L) 150 - 400 K/uL    Comment: CONSISTENT WITH PREVIOUS RESULT  Basic metabolic panel     Status: Abnormal   Collection Time: 05/28/16  5:08 AM  Result Value Ref Range   Sodium 137 135 - 145 mmol/L    Comment: RESULT REPEATED AND VERIFIED   Potassium 4.2 3.5 - 5.1 mmol/L    Comment: RESULT REPEATED AND VERIFIED DELTA CHECK NOTED    Chloride 107 101 - 111 mmol/L    Comment: RESULT REPEATED AND VERIFIED   CO2 25 22 - 32 mmol/L    Comment: RESULT REPEATED AND VERIFIED   Glucose, Bld 109 (H) 65 - 99 mg/dL   BUN 41 (H) 6 - 20 mg/dL   Creatinine, Ser 1.67 (H) 0.61 - 1.24 mg/dL   Calcium 7.5 (L) 8.9 - 10.3 mg/dL    Comment: RESULT REPEATED AND VERIFIED   GFR calc non Af Amer 35 (L) >60 mL/min   GFR calc Af Amer 40 (L) >60 mL/min    Comment: (NOTE) The eGFR has been calculated using the CKD EPI equation. This calculation has not been validated in all clinical situations. eGFR's persistently <60 mL/min signify possible Chronic Kidney Disease.    Anion gap 5 5 -  15  Glucose, capillary     Status: Abnormal   Collection Time: 05/28/16  6:54 AM  Result Value Ref Range   Glucose-Capillary 104 (H) 65 - 99 mg/dL    Radiology/Results: No results  found.  Anti-infectives: Anti-infectives    Start     Dose/Rate Route Frequency Ordered Stop   05/27/16 0600  vancomycin (VANCOCIN) IVPB 1000 mg/200 mL premix     1,000 mg 200 mL/hr over 60 Minutes Intravenous On call to O.R. 05/26/16 1400 05/27/16 0932      Assessment/Plan: Problem List: Patient Active Problem List   Diagnosis Date Noted  . Pre-operative cardiovascular clearance 05/25/2016  . Palliative care encounter   . Goals of care, counseling/discussion   . Dementia   . Acute on chronic renal insufficiency ([place]) 05/24/2016  . Malnutrition of moderate degree 05/22/2016  . Acute delirium 05/22/2016  . Inguinal hernia 05/20/2016  . Bilateral inguinal hernia without obstruction or gangrene 05/20/2016  . LLQ abdominal pain 05/20/2016  . Constipation 05/20/2016  . Anemia, chronic disease 05/20/2016  . Generalized weakness 05/20/2016  . CKD (chronic kidney disease) stage 3, GFR 30-59 ml/min 05/20/2016  . Chronic anticoagulation 05/20/2016  . Elevated serum creatinine   . SOB (shortness of breath)   . Essential hypertension   . Type 2 diabetes mellitus with complication ([place])   . COPD exacerbation ([place]) 12/31/2015  . OA (osteoarthritis) 12/02/2015  . ESR raised 07/22/2014  . Anemia 02/18/2014  . Weight loss 12/11/2013  . LV dysfunction-EF 40-45% 03/02/2012  . PAF (paroxysmal atrial fibrillation) ([place]) 02/22/2012  . Vitamin B12 deficiency 09/29/2011  . COPD with bronchitis 09/29/2011  . History of gastroesophageal reflux (GERD) 09/29/2011  . Diabetes mellitus type 2, diet-controlled ([place])   . Other B-complex deficiencies 05/24/2011  . Other general symptoms  05/17/2011  . NASH (nonalcoholic steatohepatitis) 05/17/2011  . Cirrhosis of liver not due to alcohol  05/17/2011  . Personal history of colonic polyps 05/17/2011  . CAD-known CTO RCA with L-R collaterals 03/17/2011  . Syncope and collapse   . Dizziness   . Diaphoresis   . Chest pain   . Chest tightness   .  Palpitations   . COPD (chronic obstructive pulmonary disease) ([place])   . GERD (gastroesophageal reflux disease)   . BPH (benign prostatic hypertrophy)   . HYPERLIPIDEMIA 11/19/2007  . OBESITY 11/19/2007  . HYPERTENSION 11/19/2007  . INTERNAL HEMORRHOIDS 11/19/2007  . GERD 11/19/2007  . DIVERTICULOSIS, COLON 11/19/2007  . BILIARY CIRRHOSIS, PRIMARY 11/19/2007  . BENIGN PROSTATIC HYPERTROPHY, HX OF 11/19/2007  . COLONIC POLYPS, ADENOMATOUS 07/08/2002    OK to transition to rehab or SNF as appropriate per Dr. Candiss Norse  1 Day Post-Op    LOS: 5 days   Matt B. Hassell Done, MD, [facility] [phone] beeper [phone]  05/28/2016 9:06 AM

## 2016-05-28 NOTE — Progress Notes (Signed)
Subjective: Left uppper CP Worse with inspiration  Breathing wife says is shallow  Still with abd pain  Tyl didn't help   Objective: Vitals:   05/27/16 1121 05/27/16 1257 05/27/16 2200 05/28/16 0649  BP: (!) 163/67 (!) 140/52 140/63 (!) 133/55  Pulse:  75 80 83  Resp: 18 18 20 16   Temp: 97.6 F (36.4 C) 97.4 F (36.3 C) 97.9 F (36.6 C) 97.8 F (36.6 C)  TempSrc:  Oral Oral Oral  SpO2: 99% 100% 98% 99%  Weight:      Height:       Weight change:   Intake/Output Summary (Last 24 hours) at 05/28/16 0959 Last data filed at 05/28/16 0650  Gross per 24 hour  Intake           674.17 ml  Output              750 ml  Net           -75.83 ml   I/O  2.1 L negative   General: Alert, awake, oriented x3, in no acute distress Neck:  JVP is normal Heart: Regular rate and rhythm, without murmurs, rubs, gallops.  Lungs: Clear to auscultation.  No rales or wheezes. Exemities:  No edema.   Neuro: Grossly intact, nonfocal.  Tele:  NO  Lab Results: Results for orders placed or performed during the hospital encounter of 05/20/16 (from the past 24 hour(s))  Glucose, capillary     Status: Abnormal   Collection Time: 05/27/16 10:39 AM  Result Value Ref Range   Glucose-Capillary 116 (H) 65 - 99 mg/dL  Glucose, capillary     Status: Abnormal   Collection Time: 05/27/16  2:59 PM  Result Value Ref Range   Glucose-Capillary 120 (H) 65 - 99 mg/dL  Glucose, capillary     Status: Abnormal   Collection Time: 05/27/16  6:56 PM  Result Value Ref Range   Glucose-Capillary 136 (H) 65 - 99 mg/dL   Comment 1 Notify RN   Glucose, capillary     Status: Abnormal   Collection Time: 05/28/16 12:15 AM  Result Value Ref Range   Glucose-Capillary 166 (H) 65 - 99 mg/dL  Magnesium     Status: None   Collection Time: 05/28/16  5:08 AM  Result Value Ref Range   Magnesium 1.9 1.7 - 2.4 mg/dL  CBC     Status: Abnormal   Collection Time: 05/28/16  5:08 AM  Result Value Ref Range   WBC 10.9 (H) 4.0 - 10.5  K/uL   RBC 3.32 (L) 4.22 - 5.81 MIL/uL   Hemoglobin 10.3 (L) 13.0 - 17.0 g/dL   HCT 30.3 (L) 39.0 - 52.0 %   MCV 91.3 78.0 - 100.0 fL   MCH 31.0 26.0 - 34.0 pg   MCHC 34.0 30.0 - 36.0 g/dL   RDW 15.2 11.5 - 15.5 %   Platelets 99 (L) 150 - 400 K/uL  Basic metabolic panel     Status: Abnormal   Collection Time: 05/28/16  5:08 AM  Result Value Ref Range   Sodium 137 135 - 145 mmol/L   Potassium 4.2 3.5 - 5.1 mmol/L   Chloride 107 101 - 111 mmol/L   CO2 25 22 - 32 mmol/L   Glucose, Bld 109 (H) 65 - 99 mg/dL   BUN 41 (H) 6 - 20 mg/dL   Creatinine, Ser 1.67 (H) 0.61 - 1.24 mg/dL   Calcium 7.5 (L) 8.9 - 10.3 mg/dL   GFR calc  non Af Amer 35 (L) >60 mL/min   GFR calc Af Amer 40 (L) >60 mL/min   Anion gap 5 5 - 15  Glucose, capillary     Status: Abnormal   Collection Time: 05/28/16  6:54 AM  Result Value Ref Range   Glucose-Capillary 104 (H) 65 - 99 mg/dL    Studies/Results: No results found.  Medications: Reviewed     @PROBHOSP @ 1  CAD  I am not convince CP is angina   Would get EKG to contirm no acute changes  I would keep on tele  2.  PAF  HR is regular  3.  CKD  Follow    4  HTN  BP is lablil  LOS: 5 days   Dorris Carnes 05/28/2016, 9:59 AM

## 2016-05-28 NOTE — Progress Notes (Signed)
Patient bladder scan showed 434ml.  Order received to place foley catheter.  Foley placed, patient tolerated. 777ml clear yellow urine already emptied from bag.  Will continue to monitor.

## 2016-05-29 ENCOUNTER — Inpatient Hospital Stay (HOSPITAL_COMMUNITY): Payer: Medicare Other

## 2016-05-29 DIAGNOSIS — K4020 Bilateral inguinal hernia, without obstruction or gangrene, not specified as recurrent: Secondary | ICD-10-CM

## 2016-05-29 DIAGNOSIS — I2510 Atherosclerotic heart disease of native coronary artery without angina pectoris: Secondary | ICD-10-CM

## 2016-05-29 LAB — GLUCOSE, CAPILLARY
GLUCOSE-CAPILLARY: 140 mg/dL — AB (ref 65–99)
GLUCOSE-CAPILLARY: 133 mg/dL — AB (ref 65–99)
Glucose-Capillary: 132 mg/dL — ABNORMAL HIGH (ref 65–99)
Glucose-Capillary: 150 mg/dL — ABNORMAL HIGH (ref 65–99)
Glucose-Capillary: 98 mg/dL (ref 65–99)

## 2016-05-29 LAB — BASIC METABOLIC PANEL
ANION GAP: 5 (ref 5–15)
BUN: 42 mg/dL — ABNORMAL HIGH (ref 6–20)
CALCIUM: 7.4 mg/dL — AB (ref 8.9–10.3)
CHLORIDE: 104 mmol/L (ref 101–111)
CO2: 25 mmol/L (ref 22–32)
CREATININE: 1.52 mg/dL — AB (ref 0.61–1.24)
GFR calc non Af Amer: 39 mL/min — ABNORMAL LOW (ref >60)
GFR, EST AFRICAN AMERICAN: 45 mL/min — AB (ref >60)
GLUCOSE: 110 mg/dL — AB (ref 65–99)
Potassium: 3.8 mmol/L (ref 3.5–5.1)
Sodium: 134 mmol/L — ABNORMAL LOW (ref 135–145)

## 2016-05-29 LAB — MAGNESIUM: MAGNESIUM: 2.2 mg/dL (ref 1.7–2.4)

## 2016-05-29 LAB — CBC
HEMATOCRIT: 29.1 % — AB (ref 39.0–52.0)
HEMOGLOBIN: 9.6 g/dL — AB (ref 13.0–17.0)
MCH: 30.2 pg (ref 26.0–34.0)
MCHC: 33 g/dL (ref 30.0–36.0)
MCV: 91.5 fL (ref 78.0–100.0)
Platelets: 102 10*3/uL — ABNORMAL LOW (ref 150–400)
RBC: 3.18 MIL/uL — ABNORMAL LOW (ref 4.22–5.81)
RDW: 14.6 % (ref 11.5–15.5)
WBC: 9 10*3/uL (ref 4.0–10.5)

## 2016-05-29 MED ORDER — METOPROLOL TARTRATE 25 MG PO TABS
25.0000 mg | ORAL_TABLET | Freq: Two times a day (BID) | ORAL | Status: DC
  Administered 2016-05-29: 25 mg via ORAL
  Filled 2016-05-29 (×3): qty 1

## 2016-05-29 MED ORDER — SODIUM CHLORIDE 0.9 % IV SOLN
500.0000 mg | Freq: Two times a day (BID) | INTRAVENOUS | Status: DC
  Administered 2016-05-29 – 2016-06-02 (×9): 500 mg via INTRAVENOUS
  Filled 2016-05-29 (×10): qty 500

## 2016-05-29 MED ORDER — SODIUM CHLORIDE 0.9 % IV SOLN
INTRAVENOUS | Status: DC
  Administered 2016-05-29: 75 mL/h via INTRAVENOUS
  Administered 2016-05-30: 01:00:00 via INTRAVENOUS

## 2016-05-29 MED ORDER — GUAIFENESIN-DM 100-10 MG/5ML PO SYRP
5.0000 mL | ORAL_SOLUTION | ORAL | Status: DC | PRN
  Administered 2016-05-29 – 2016-06-01 (×2): 5 mL via ORAL
  Filled 2016-05-29 (×2): qty 10

## 2016-05-29 MED ORDER — ENSURE ENLIVE PO LIQD
237.0000 mL | Freq: Three times a day (TID) | ORAL | Status: DC
  Administered 2016-05-29 – 2016-06-02 (×7): 237 mL via ORAL

## 2016-05-29 MED ORDER — PRO-STAT SUGAR FREE PO LIQD
30.0000 mL | Freq: Three times a day (TID) | ORAL | Status: DC
  Administered 2016-05-30 – 2016-06-02 (×10): 30 mL via ORAL
  Filled 2016-05-29 (×11): qty 30

## 2016-05-29 MED ORDER — ACETYLCYSTEINE 20 % IN SOLN
4.0000 mL | Freq: Once | RESPIRATORY_TRACT | Status: DC
  Filled 2016-05-29: qty 4

## 2016-05-29 NOTE — Progress Notes (Signed)
PROGRESS NOTE                                                                                                                                                                                                             Patient Demographics:    Cory Lawrence, is a 82 y.o. male, DOB - 03/04/1925, [id number]  Admit date - 05/20/2016   Admitting Physician Murlean Iba, MD  Outpatient Primary MD for the patient is [name], [name], MD  LOS - 6  Chief Complaint  Patient presents with  . Hernia       Brief Narrative   82 year old Caucasian male with history of advanced dementia, paroxysmal atrial fibrillation [name] 2 score of at least 6, on xaralto, COPD, BPH, bilateral inguinal hernias, CAD, chronic combined systolic and diastolic CHF EF [id number], Nash, CK D stage III, DM type II, primary biliary cirrhosis who was admitted to the hospital with abdominal pain due to incarcerated hernias left more than right. He was also found to be in acute renal failure. General surgery palliative care following. Patient is DO NOT RESUSCITATE. Family is very reasonable, however in my opinion patient will have no chance of survival are good quality of life if we do not proceed with surgical correction of his incarcerated hernia. I have discussed this with general surgeon Dr. Wynonia Sours on 05/25/2016.  He shouldn't and wife clearly understand that he is a very high risk of adverse cardio pulmonary outcome and the accept this risk, they want to take the chance of undergoing surgery with at least some chance of pain-free and decent quality of life. They agree that if surgery does not go well patient might die or end up with residential hospice, for now plan was to defer surgery with residential hospice anyways.    Subjective:    [name] today has, No headache, No chest pain, No abdominal pain - No Nausea, No new weakness tingling or numbness,  Positive cough and some shortness of breath developed on 05/29/2016.    Assessment  & Plan :     1.Bilateral incarcerated inguinal hernias left more than right. Gen. surgery consulted, after much deliberation patient underwent bilateral inguinal hernia repair surgery by Dr. Harlow Asa on 05/27/2016, seems to have tolerated the procedure well, we'll continue to monitor with supportive care. IS in place, will  need SNF.  2. CAD with chronic ischemic cardiomyopathy causing chronic combined systolic and diastolic heart failure EF 40%. Currently compensated. We appreciate cardiology input Continue Imdur and statin for secondary prevention.  3. Paroxysmal atrial fibrillation with [name] 2 score overtly 6. Currently in sinus bradycardia, not on any rate controlling medications except digoxin which has been held due to renal insufficiency, digoxin level is stable, xaralto currently on hold due to pending surgery. Platelets are borderline and I doubt bridging aggressively with heparin will provide any meaningful benefit.  4. Dyslipidemia. On statin.  5. NASH with primary biliary cirrhosis. Supportive care, continue Urosodiol.  6. Acute renal failure on CK D stage III. Baseline creatinine close to 1.4. Being hydrated renal function improving, will  Monitor bladder scan. Has condom catheter.  7. Advanced dementia, generalized deconditioning - I risk for delirium, continue supportive care with home dementia medications, patient and wife wish him to be DO NOT RESUSCITATE, they do not wish any unnecessary heroics. If stable will go to SNF if declines will go to residential hospice.  8. Essential hypertension. For now as needed IV hydralazine.  9. GERD. On PPI.  10. History of COPD. Stable continue supportive care with oxygen and nebulizer treatments as needed.  11. Chronic low-dose prednisone use. Wife unsure why he takes it, for now 50 mg of IV hydrocortisone daily, if blood pressure drops will change to  100 mg 3 times a day.  12. AOCD. Type screen will transfuse 1 unit of packed RBC preop on 05/26/2016. Monitor H&H.  13. Urinary retention. Continue Cardura, Foley placed on 05/28/2016.   14. Left-sided pneumonia, suspicious for aspiration due to recent surgery and profound weakness and poor cough reflex. Therapy to evaluate, feeding assistance and aspiration precautions, sputum culture, supportive care with oxygen and nebulizer treatments, he did have significant cough early morning 05/29/2016 per wife. Placed on Unasyn for now and monitor.   15. DM type II. Monitor on sliding scale.  Lab Results  Component Value Date   HGBA1C 5.3 05/21/2016    CBG (last 3)   Recent Labs  05/29/16 0017 05/29/16 0413 05/29/16 0938  GLUCAP 132* 31 140*      Family Communication  :  Wife  Code Status :  DNR  Diet : NPO for surgery then heart healthy as tolerated  Disposition Plan  :  Likely SNF    Consults  :  CCS, Pall Care, Cards  Procedures  :    TTE   Normal LV wall thickness with LVEF approximately 40-45%. Mid to basal inferolateral hypokinesis. Grade 1 diastolic dysfunction with normal estimated LV filling pressure. Mild left atrial enlargement. MAC with trivial mitral regurgitation. Mild to moderate calcific aortic stenosis. Trivial tricuspid regurgitation.  Bilateral Inguinal hernia surgery bilateral by Dr. Armandina Gemma on 05/27/2016.   DVT Prophylaxis  :   Heparin    Lab Results  Component Value Date   PLT 102 (L) 05/29/2016    Inpatient Medications  Scheduled Meds: . acetylcysteine  4 mL Nebulization Once  . antiseptic oral rinse  7 mL Mouth Rinse BID  . digoxin  0.125 mg Oral Daily  . donepezil  5 mg Oral QHS  . doxazosin  2 mg Oral QHS  . feeding supplement (ENSURE ENLIVE)  237 mL Oral TID BM  . feeding supplement (PRO-STAT SUGAR FREE 64)  30 mL Oral TID WC  . finasteride  5 mg Oral Daily  . fluticasone furoate-vilanterol  1 puff Inhalation Daily  .  hydrocortisone sod succinate (SOLU-CORTEF) inj  50 mg Intravenous Daily  . insulin aspart  0-9 Units Subcutaneous Q4H  . isosorbide mononitrate  30 mg Oral Daily  . metoprolol tartrate  25 mg Oral BID  . pantoprazole  40 mg Oral Daily  . polyethylene glycol  17 g Oral Daily  . rivaroxaban  15 mg Oral Q supper  . senna-docusate  1 tablet Oral BID  . simvastatin  20 mg Oral QHS  . ursodiol  250 mg Oral BID   Continuous Infusions: . sodium chloride     PRN Meds:.acetaminophen **OR** [DISCONTINUED] acetaminophen, bisacodyl, diphenhydrAMINE, guaiFENesin-dextromethorphan, haloperidol, hydrALAZINE, HYDROcodone-acetaminophen, HYDROmorphone (DILAUDID) injection, ipratropium-albuterol, metoprolol, ondansetron (ZOFRAN) IV, [DISCONTINUED] ondansetron **OR** ondansetron (ZOFRAN) IV  Antibiotics  :    Anti-infectives    Start     Dose/Rate Route Frequency Ordered Stop   05/27/16 0600  vancomycin (VANCOCIN) IVPB 1000 mg/200 mL premix     1,000 mg 200 mL/hr over 60 Minutes Intravenous On call to O.R. 05/26/16 1400 05/27/16 0932         Objective:   Vitals:   05/28/16 0649 05/28/16 1119 05/28/16 2037 05/29/16 0416  BP: (!) 133/55  (!) 136/50 (!) 159/52  Pulse: 83  60 (!) 59  Resp: 16  18 16   Temp: 97.8 F (36.6 C)  97.6 F (36.4 C) 97.8 F (36.6 C)  TempSrc: Oral  Oral Oral  SpO2: 99% 96% 100% 100%  Weight:      Height:        Wt Readings from Last 3 Encounters:  05/20/16 54.4 kg (120 lb)  03/24/16 56.9 kg (125 lb 6.4 oz)  02/11/16 57.6 kg (127 lb)     Intake/Output Summary (Last 24 hours) at 05/29/16 1103 Last data filed at 05/29/16 0700  Gross per 24 hour  Intake           859.17 ml  Output             1250 ml  Net          -390.83 ml     Physical Exam  Awake but Pleasantly confused, No new F.N deficits,   [place].AT,PERRAL Supple Neck,No JVD, No cervical lymphadenopathy appriciated.  Symmetrical Chest wall movement, Good air movement bilaterally, CTAB RRR,No  Gallops,Rubs or new Murmurs, No Parasternal Heave +ve B.Sounds, Abd Soft, No tenderness, No organomegaly appriciated, No rebound - guarding or rigidity. No Cyanosis, Clubbing or edema, No new Rash or bruise  Bilat Ing hernia surgery scar under bandage.    Data Review:    CBC  Recent Labs Lab 05/25/16 0549 05/26/16 0529 05/26/16 1641 05/27/16 0528 05/28/16 0508 05/29/16 0503  WBC 6.5 5.8  --  8.2 10.9* 9.0  HGB 9.0* 8.1* 10.1* 9.8* 10.3* 9.6*  HCT 27.4* 24.0* 29.9* 28.3* 30.3* 29.1*  PLT 100* 92*  --  96* 99* 102*  MCV 91.9 90.9  --  90.1 91.3 91.5  MCH 30.2 30.7  --  31.2 31.0 30.2  MCHC 32.8 33.8  --  34.6 34.0 33.0  RDW 14.8 14.6  --  15.2 15.2 14.6  LYMPHSABS 1.0  --   --   --   --   --   MONOABS 0.4  --   --   --   --   --   EOSABS 0.1  --   --   --   --   --   BASOSABS 0.0  --   --   --   --   --  Chemistries   Recent Labs Lab 05/25/16 0549 05/26/16 0529 05/27/16 0528 05/28/16 0508 05/29/16 0503  NA 139 142 139 137 134*  K 4.0 3.7 3.2* 4.2 3.8  CL 110 114* 109 107 104  CO2 24 24 24 25 25   GLUCOSE 100* 89 95 109* 110*  BUN 55* 47* 42* 41* 42*  CREATININE 2.81* 1.65* 1.55* 1.67* 1.52*  CALCIUM 7.5* 7.4* 7.5* 7.5* 7.4*  MG  --   --  1.7 1.9 2.2  AST 14*  --   --   --   --   ALT 12*  --   --   --   --   ALKPHOS 71  --   --   --   --   BILITOT 0.7  --   --   --   --    ------------------------------------------------------------------------------------------------------------------ No results for input(s): CHOL, HDL, LDLCALC, TRIG, CHOLHDL, LDLDIRECT in the last 72 hours.  Lab Results  Component Value Date   HGBA1C 5.3 05/21/2016   ------------------------------------------------------------------------------------------------------------------ No results for input(s): TSH, T4TOTAL, T3FREE, THYROIDAB in the last 72 hours.  Invalid input(s):  FREET3 ------------------------------------------------------------------------------------------------------------------ No results for input(s): VITAMINB12, FOLATE, FERRITIN, TIBC, IRON, RETICCTPCT in the last 72 hours.  Coagulation profile  Recent Labs Lab 05/25/16 1038  INR 1.32    No results for input(s): DDIMER in the last 72 hours.  Cardiac Enzymes No results for input(s): CKMB, TROPONINI, MYOGLOBIN in the last 168 hours.  Invalid input(s): CK ------------------------------------------------------------------------------------------------------------------    Component Value Date/Time   BNP 116.5 (H) 12/31/2015 1720    Micro Results Recent Results (from the past 240 hour(s))  Surgical pcr screen     Status: None   Collection Time: 05/26/16  5:44 PM  Result Value Ref Range Status   MRSA, PCR NEGATIVE NEGATIVE Final   Staphylococcus aureus NEGATIVE NEGATIVE Final    Comment:        The Xpert SA Assay (FDA approved for NASAL specimens in patients over 80 years of age), is one component of a comprehensive surveillance program.  Test performance has been validated by [facility] for patients greater than or equal to 61 year old. It is not intended to diagnose infection nor to guide or monitor treatment.     Radiology Reports Dg Chest 2 View  Result Date: 05/05/2016 CLINICAL DATA:  Chest pain, left flank pain, fall 5 days ago EXAM: CHEST  2 VIEW COMPARISON:  12/31/2015 FINDINGS: Cardiomediastinal silhouette is stable. Hyperinflation again noted. Stable chronic interstitial prominence. There is left basilar streaky atelectasis or early infiltrate. No pulmonary edema. Osteopenia and degenerative changes thoracic spine. Atherosclerotic calcifications of thoracic aorta. No pneumothorax. IMPRESSION: Hyperinflation. Chronic interstitial prominence. No pulmonary edema. Streaky left basilar atelectasis or early infiltrate. No pneumothorax. Electronically Signed   By: Lahoma Crocker M.D.   On: 05/05/2016 13:47   Dg Ribs Unilateral W/chest Left  Result Date: 05/05/2016 CLINICAL DATA:  Left-sided chest pain, fell 5 days ago EXAM: LEFT RIBS AND CHEST - 3+ VIEW COMPARISON:  Chest x-ray of 12/31/2015 FINDINGS: On the images obtained, no acute left rib fracture is seen. The current images are very obliqued and standard rib detail views would be helpful. The lungs appear to be hyperaerated consistent with an element of emphysema. IMPRESSION: 1. No acute left rib fracture seen on the images obtained. Please see above. 2. Emphysema. Electronically Signed   By: Ivar Drape M.D.   On: 05/05/2016 13:47   Dg Chest [facility]  Result Date: 05/29/2016 CLINICAL DATA:  Cough, shortness of breath, weakness EXAM: PORTABLE CHEST 1 VIEW COMPARISON:  05/28/2016 FINDINGS: Patchy opacities within the left upper lobe and left base concerning for pneumonia. Hyperinflation compatible with COPD. Heart is borderline in size. Suspect small effusions. IMPRESSION: Continued left lower lobe opacity. New left upper lobe airspace disease. Findings concerning for pneumonia. Small effusions. COPD. Electronically Signed   By: Rolm Baptise M.D.   On: 05/29/2016 10:56  Dg Chest Port 1 View  Result Date: 05/28/2016 CLINICAL DATA:  Acute shortness of breath and hypoxia today. EXAM: PORTABLE CHEST 1 VIEW COMPARISON:  05/20/2016 and prior radiographs FINDINGS: Cardiomegaly again noted. Left lower lung atelectasis versus airspace disease noted. Trace bilateral pleural effusions are present. There is no evidence of pneumothorax or pulmonary edema. No acute bony abnormalities are identified. IMPRESSION: Left lower lung atelectasis versus airspace disease. Pneumonia not excluded. Trace bilateral pleural effusions. Electronically Signed   By: Margarette Canada M.D.   On: 05/28/2016 10:56  Dg Abd Acute W/chest  Result Date: 05/20/2016 CLINICAL DATA:  Left lower abdominal pain for 4-5 days EXAM: DG ABDOMEN ACUTE W/ 1V CHEST  COMPARISON:  05/16/2016 FINDINGS: There is no evidence of dilated bowel loops or free intraperitoneal air. No radiopaque calculi or other significant radiographic abnormality is seen. Heart size and mediastinal contours are within normal limits. The lungs are hyperinflated likely secondary to COPD. IMPRESSION: Negative abdominal radiographs.  No acute cardiopulmonary disease. Electronically Signed   By: Kathreen Devoid   On: 05/20/2016 15:37   Dg Abd Acute W/chest  Result Date: 05/16/2016 CLINICAL DATA:  82 year old male with history of bilateral inguinal hernias and discomfort for the past 3 days EXAM: DG ABDOMEN ACUTE W/ 1V CHEST COMPARISON:  Chest x-ray 05/05/2016. FINDINGS: Lung volumes appear slightly increased and there are mild emphysematous changes noted. Architectural distortion at the base of the left lung, similar to prior studies, favored to reflect areas of chronic scarring. Small bilateral pleural effusions. No evidence of pulmonary edema. No suspicious appearing pulmonary nodules or masses. Heart size is normal. Upper mediastinal contours are within normal limits. Aortic atherosclerosis. Gas and stool are noted throughout the colon extending to the level of the distal rectum. No pathologic dilatation of small bowel is noted. Several nondilated gas-filled loops of small bowel are noted, predominantly in the left side of the abdomen. No pneumoperitoneum. IMPRESSION: 1. Nonobstructive bowel gas pattern. 2. No pneumoperitoneum. 3. Small bilateral pleural effusions. 4. Probable scarring the left lung base. 5. Aortic atherosclerosis. Electronically Signed   By: Vinnie Langton M.D.   On: 05/16/2016 15:32    Time Spent in minutes  30   Agueda Houpt K M.D on 05/29/2016 at 11:03 AM  Between 7am to 7pm - Pager - [phone]  After 7pm go to [email or web address] - password [facility]  [facility] -  Office  [phone]

## 2016-05-29 NOTE — Progress Notes (Signed)
Pharmacy Antibiotic Note  Cory Lawrence is a 82 y.o. male admitted on 05/20/2016 who underwent bilateral inguinal hernia repair surgery on 05/27/16. CXR today suggestive of pneumonia. Per MD, suspicious of aspiration due to recent surgery and profound weakness and poor cough reflex. Patient did have significant cough early 05/29/16 AM. Pharmacy has been consulted for Primaxin dosing.  Plan: Primaxin 500mg  IV q12h. Monitor renal function, cultures, clinical course.  Height: 5\' 5"  (165.1 cm) Weight: 120 lb (54.4 kg) IBW/kg (Calculated) : 61.5  Temp (24hrs), Avg:97.7 F (36.5 C), Min:97.6 F (36.4 C), Max:97.8 F (36.6 C)   Recent Labs Lab 05/25/16 0549 05/26/16 0529 05/27/16 0528 05/28/16 0508 05/29/16 0503  WBC 6.5 5.8 8.2 10.9* 9.0  CREATININE 2.81* 1.65* 1.55* 1.67* 1.52*    Estimated Creatinine Clearance: 25.4 mL/min (by C-G formula based on SCr of 1.52 mg/dL).     Antimicrobials this admission: 7/28 >> Vancomycin x 1 pre-op 7/30 >> Primaxin >>  Dose adjustments this admission: --  Microbiology results: 7/30 Sputum: sent  7/27 MRSA PCR: negative  Thank you for allowing pharmacy to be a part of this patient's care.   Lindell Spar, PharmD, BCPS Pager: [phone] 05/29/2016 11:42 AM

## 2016-05-29 NOTE — Progress Notes (Signed)
Subjective: REsting  No CP   Objective: Vitals:   05/28/16 0649 05/28/16 1119 05/28/16 2037 05/29/16 0416  BP: (!) 133/55  (!) 136/50 (!) 159/52  Pulse: 83  60 (!) 59  Resp: 16  18 16   Temp: 97.8 F (36.6 C)  97.6 F (36.4 C) 97.8 F (36.6 C)  TempSrc: Oral  Oral Oral  SpO2: 99% 96% 100% 100%  Weight:      Height:       Weight change:   Intake/Output Summary (Last 24 hours) at 05/29/16 0844 Last data filed at 05/29/16 0700  Gross per 24 hour  Intake           979.17 ml  Output             1250 ml  Net          -270.83 ml    General: Alert, awake, oriented x3, in no acute distress Neck:  JVP is normal Heart: Regular rate and rhythm, without murmurs, rubs, gallops.  Lungs: Clear to auscultation.  No rales or wheezes. Exemities:  No edema.   Neuro: Grossly intact, nonfocal.   Lab Results: Results for orders placed or performed during the hospital encounter of 05/20/16 (from the past 24 hour(s))  Glucose, capillary     Status: Abnormal   Collection Time: 05/28/16  1:08 PM  Result Value Ref Range   Glucose-Capillary 164 (H) 65 - 99 mg/dL  Glucose, capillary     Status: Abnormal   Collection Time: 05/28/16  5:31 PM  Result Value Ref Range   Glucose-Capillary 161 (H) 65 - 99 mg/dL  Glucose, capillary     Status: Abnormal   Collection Time: 05/29/16 12:17 AM  Result Value Ref Range   Glucose-Capillary 132 (H) 65 - 99 mg/dL  Glucose, capillary     Status: None   Collection Time: 05/29/16  4:13 AM  Result Value Ref Range   Glucose-Capillary 98 65 - 99 mg/dL  CBC     Status: Abnormal   Collection Time: 05/29/16  5:03 AM  Result Value Ref Range   WBC 9.0 4.0 - 10.5 K/uL   RBC 3.18 (L) 4.22 - 5.81 MIL/uL   Hemoglobin 9.6 (L) 13.0 - 17.0 g/dL   HCT 29.1 (L) 39.0 - 52.0 %   MCV 91.5 78.0 - 100.0 fL   MCH 30.2 26.0 - 34.0 pg   MCHC 33.0 30.0 - 36.0 g/dL   RDW 14.6 11.5 - 15.5 %   Platelets 102 (L) 150 - 400 K/uL  Basic metabolic panel     Status: Abnormal   Collection Time: 05/29/16  5:03 AM  Result Value Ref Range   Sodium 134 (L) 135 - 145 mmol/L   Potassium 3.8 3.5 - 5.1 mmol/L   Chloride 104 101 - 111 mmol/L   CO2 25 22 - 32 mmol/L   Glucose, Bld 110 (H) 65 - 99 mg/dL   BUN 42 (H) 6 - 20 mg/dL   Creatinine, Ser 1.52 (H) 0.61 - 1.24 mg/dL   Calcium 7.4 (L) 8.9 - 10.3 mg/dL   GFR calc non Af Amer 39 (L) >60 mL/min   GFR calc Af Amer 45 (L) >60 mL/min   Anion gap 5 5 - 15  Magnesium     Status: None   Collection Time: 05/29/16  5:03 AM  Result Value Ref Range   Magnesium 2.2 1.7 - 2.4 mg/dL    Studies/Results: Dg Chest Port 1 View  Result Date: 05/28/2016  CLINICAL DATA:  Acute shortness of breath and hypoxia today. EXAM: PORTABLE CHEST 1 VIEW COMPARISON:  05/20/2016 and prior radiographs FINDINGS: Cardiomegaly again noted. Left lower lung atelectasis versus airspace disease noted. Trace bilateral pleural effusions are present. There is no evidence of pneumothorax or pulmonary edema. No acute bony abnormalities are identified. IMPRESSION: Left lower lung atelectasis versus airspace disease. Pneumonia not excluded. Trace bilateral pleural effusions. Electronically Signed   By: Margarette Canada M.D.   On: 05/28/2016 10:56   Medications:Reviewed    @PROBHOSP @  PT is POD 2   Comfortable in bed   Not on telemetry  EKG yesterday OK  HR is regular today   No new recommendations   WIll be available as needed  Please call    LOS: 6 days   Dorris Carnes 05/29/2016, 8:44 AM

## 2016-05-30 ENCOUNTER — Other Ambulatory Visit: Payer: Medicare Other

## 2016-05-30 ENCOUNTER — Ambulatory Visit: Payer: Medicare Other | Admitting: Family

## 2016-05-30 ENCOUNTER — Ambulatory Visit: Payer: Medicare Other

## 2016-05-30 DIAGNOSIS — R109 Unspecified abdominal pain: Secondary | ICD-10-CM

## 2016-05-30 DIAGNOSIS — Z7189 Other specified counseling: Secondary | ICD-10-CM

## 2016-05-30 DIAGNOSIS — R100 Acute abdomen: Secondary | ICD-10-CM

## 2016-05-30 LAB — GLUCOSE, CAPILLARY
GLUCOSE-CAPILLARY: 132 mg/dL — AB (ref 65–99)
GLUCOSE-CAPILLARY: 156 mg/dL — AB (ref 65–99)
GLUCOSE-CAPILLARY: 95 mg/dL (ref 65–99)
Glucose-Capillary: 84 mg/dL (ref 65–99)
Glucose-Capillary: 123 mg/dL — ABNORMAL HIGH (ref 65–99)
Glucose-Capillary: 144 mg/dL — ABNORMAL HIGH (ref 65–99)

## 2016-05-30 LAB — BASIC METABOLIC PANEL
Anion gap: 5 (ref 5–15)
BUN: 38 mg/dL — AB (ref 6–20)
CHLORIDE: 107 mmol/L (ref 101–111)
CO2: 24 mmol/L (ref 22–32)
Calcium: 7.3 mg/dL — ABNORMAL LOW (ref 8.9–10.3)
Creatinine, Ser: 1.22 mg/dL (ref 0.61–1.24)
GFR calc non Af Amer: 51 mL/min — ABNORMAL LOW (ref >60)
GFR, EST AFRICAN AMERICAN: 59 mL/min — AB (ref >60)
Glucose, Bld: 87 mg/dL (ref 65–99)
POTASSIUM: 3.5 mmol/L (ref 3.5–5.1)
SODIUM: 136 mmol/L (ref 135–145)

## 2016-05-30 LAB — CBC
HCT: 28.6 % — ABNORMAL LOW (ref 39.0–52.0)
HEMOGLOBIN: 9.7 g/dL — AB (ref 13.0–17.0)
MCH: 31 pg (ref 26.0–34.0)
MCHC: 33.9 g/dL (ref 30.0–36.0)
MCV: 91.4 fL (ref 78.0–100.0)
Platelets: 123 10*3/uL — ABNORMAL LOW (ref 150–400)
RBC: 3.13 MIL/uL — AB (ref 4.22–5.81)
RDW: 14.9 % (ref 11.5–15.5)
WBC: 10 10*3/uL (ref 4.0–10.5)

## 2016-05-30 LAB — MAGNESIUM: MAGNESIUM: 1.8 mg/dL (ref 1.7–2.4)

## 2016-05-30 MED ORDER — METOPROLOL TARTRATE 25 MG PO TABS
12.5000 mg | ORAL_TABLET | Freq: Two times a day (BID) | ORAL | Status: DC
  Administered 2016-05-30 – 2016-06-01 (×5): 12.5 mg via ORAL
  Filled 2016-05-30 (×5): qty 1

## 2016-05-30 MED ORDER — POTASSIUM CHLORIDE IN NACL 40-0.9 MEQ/L-% IV SOLN
INTRAVENOUS | Status: AC
  Administered 2016-05-30 – 2016-05-31 (×3): 50 mL/h via INTRAVENOUS
  Filled 2016-05-30 (×4): qty 1000

## 2016-05-30 NOTE — Progress Notes (Signed)
LCSWA spoke with [facility] Heather, updated about patient current status. Faxed recent therapy notes.

## 2016-05-30 NOTE — Care Management Important Message (Signed)
Important Message  Patient Details  Name: Cory Lawrence MRN: [id number] Date of Birth: [birth year]   Medicare Important Message Given:  Yes    Camillo Flaming 05/30/2016, 11:02 AMImportant Message  Patient Details  Name: Cory Lawrence MRN: [id number] Date of Birth: 21-Aug-1925   Medicare Important Message Given:  Yes    Camillo Flaming 05/30/2016, 11:01 AM

## 2016-05-30 NOTE — Progress Notes (Signed)
Daily Progress Note   Patient Name: Cory Lawrence       Date: 05/30/2016 DOB: 1925/07/01  Age: 82 y.o. MRN#: [id number] Attending Physician: Thurnell Lose, MD Primary Care Physician: [name], MD Admit Date: 05/20/2016  Reason for Consultation/Follow-up: Establishing goals of care  Subjective:  patient is awake, reasonably alert Sitting up in chair He ate about 50% of his lunch. He is on dysphagia 3 diet.  His wife just left to go home and rest.  Patient states he has a kitten at home that he misses dearly while he is in the hospital. " I think every one ought to have a kitten you know."  At times, his abdomen is sore, over all, he is able to answer quite a few yes/no type questions appropriately.    Length of Stay: 6  Current Medications: Scheduled Meds:  . acetylcysteine  4 mL Nebulization Once  . antiseptic oral rinse  7 mL Mouth Rinse BID  . digoxin  0.125 mg Oral Daily  . donepezil  5 mg Oral QHS  . doxazosin  2 mg Oral QHS  . feeding supplement (ENSURE ENLIVE)  237 mL Oral TID BM  . feeding supplement (PRO-STAT SUGAR FREE 64)  30 mL Oral TID WC  . finasteride  5 mg Oral Daily  . fluticasone furoate-vilanterol  1 puff Inhalation Daily  . hydrocortisone sod succinate (SOLU-CORTEF) inj  50 mg Intravenous Daily  . imipenem-cilastatin  500 mg Intravenous Q12H  . insulin aspart  0-9 Units Subcutaneous Q4H  . isosorbide mononitrate  30 mg Oral Daily  . metoprolol tartrate  12.5 mg Oral BID  . pantoprazole  40 mg Oral Daily  . polyethylene glycol  17 g Oral Daily  . rivaroxaban  15 mg Oral Q supper  . senna-docusate  1 tablet Oral BID  . simvastatin  20 mg Oral QHS  . ursodiol  250 mg Oral BID    Continuous Infusions: . 0.9 % NaCl with KCl 40 mEq / L 50 mL/hr  (05/30/16 0627)    PRN Meds: acetaminophen **OR** [DISCONTINUED] acetaminophen, bisacodyl, diphenhydrAMINE, guaiFENesin-dextromethorphan, haloperidol, hydrALAZINE, HYDROcodone-acetaminophen, HYDROmorphone (DILAUDID) injection, ipratropium-albuterol, metoprolol, ondansetron (ZOFRAN) IV, [DISCONTINUED] ondansetron **OR** ondansetron (ZOFRAN) IV  Physical Exam         Elderly gentleman NAD S1 S2 Clear No  edema Awake alert  Vital Signs: BP (!) 151/45   Pulse (!) 57   Temp 97.6 F (36.4 C) (Oral)   Resp 18   Ht '5\' 5"'$  (1.651 m)   Wt 54.4 kg (120 lb)   SpO2 97%   BMI 19.97 kg/m  SpO2: SpO2: 97 % O2 Device: O2 Device: Nasal Cannula O2 Flow Rate: O2 Flow Rate (L/min): 2 L/min  Intake/output summary:  Intake/Output Summary (Last 24 hours) at 05/30/16 1233 Last data filed at 05/30/16 0700  Gross per 24 hour  Intake          2203.75 ml  Output              902 ml  Net          1301.75 ml   LBM: Last BM Date: 05/30/16 Baseline Weight: Weight: 54.4 kg (120 lb) Most recent weight: Weight: 54.4 kg (120 lb)       Palliative Assessment/Data:    Flowsheet Rows   Flowsheet Row Most Recent Value  Intake Tab  Referral Department  Hospitalist  Unit at Time of Referral  Med/Surg Unit  Palliative Care Primary Diagnosis  Other (Comment)  Date Notified  05/23/16  Palliative Care Type  Return patient Palliative Care  Reason for referral  Clarify Goals of Care  Date of Admission  05/20/16  Date first seen by Palliative Care  05/24/16  # of days Palliative referral response time  1 Day(s)  # of days IP prior to Palliative referral  3  Clinical Assessment  Palliative Performance Scale Score  30%  Pain Max last 24 hours  6  Pain Min Last 24 hours  4  Psychosocial & Spiritual Assessment  Palliative Care Outcomes  Patient/Family meeting held?  Yes  Who was at the meeting?  patient   Palliative Care Outcomes  Clarified goals of care  Patient/Family wishes: Interventions  discontinued/not started   Hemodialysis, PEG  Palliative Care follow-up planned  Yes, Facility      Patient Active Problem List   Diagnosis Date Noted  . Pre-operative cardiovascular clearance 05/25/2016  . Palliative care encounter   . Goals of care, counseling/discussion   . Dementia   . Acute on chronic renal insufficiency ([place]) 05/24/2016  . Malnutrition of moderate degree 05/22/2016  . Acute delirium 05/22/2016  . Inguinal hernia 05/20/2016  . Bilateral inguinal hernia without obstruction or gangrene 05/20/2016  . LLQ abdominal pain 05/20/2016  . Constipation 05/20/2016  . Anemia, chronic disease 05/20/2016  . Generalized weakness 05/20/2016  . CKD (chronic kidney disease) stage 3, GFR 30-59 ml/min 05/20/2016  . Chronic anticoagulation 05/20/2016  . Elevated serum creatinine   . SOB (shortness of breath)   . Essential hypertension   . Type 2 diabetes mellitus with complication ([place])   . COPD exacerbation ([place]) 12/31/2015  . OA (osteoarthritis) 12/02/2015  . ESR raised 07/22/2014  . Anemia 02/18/2014  . Weight loss 12/11/2013  . LV dysfunction-EF 40-45% 03/02/2012  . PAF (paroxysmal atrial fibrillation) ([place]) 02/22/2012  . Vitamin B12 deficiency 09/29/2011  . COPD with bronchitis 09/29/2011  . History of gastroesophageal reflux (GERD) 09/29/2011  . Diabetes mellitus type 2, diet-controlled ([place])   . Other B-complex deficiencies 05/24/2011  . Other general symptoms  05/17/2011  . NASH (nonalcoholic steatohepatitis) 05/17/2011  . Cirrhosis of liver not due to alcohol  05/17/2011  . Personal history of colonic polyps 05/17/2011  . CAD-known CTO RCA with L-R collaterals 03/17/2011  . Syncope and collapse   .  Dizziness   . Diaphoresis   . Chest pain   . Chest tightness   . Palpitations   . COPD (chronic obstructive pulmonary disease) ([place])   . GERD (gastroesophageal reflux disease)   . BPH (benign prostatic hypertrophy)   . HYPERLIPIDEMIA 11/19/2007  . OBESITY  11/19/2007  . HYPERTENSION 11/19/2007  . INTERNAL HEMORRHOIDS 11/19/2007  . GERD 11/19/2007  . DIVERTICULOSIS, COLON 11/19/2007  . BILIARY CIRRHOSIS, PRIMARY 11/19/2007  . BENIGN PROSTATIC HYPERTROPHY, HX OF 11/19/2007  . COLONIC POLYPS, ADENOMATOUS 07/08/2002    Palliative Care Assessment & Plan   Patient Profile:    Assessment:  s/p recent bilateral hernia repair Dementia Frailty dysphagia  Recommendations/Plan:  D/w Dr Candiss Norse, d/w case management: recommend SNF rehab with palliative services on d/c. Continue to monitor disease trajectory and hospital course.   Code Status:    Code Status Orders        Start     Ordered   05/25/16 0718  Do not attempt resuscitation (DNR)  Continuous    Question Answer Comment  In the event of cardiac or respiratory ARREST Do not call a "code blue"   In the event of cardiac or respiratory ARREST Do not perform Intubation, CPR, defibrillation or ACLS   In the event of cardiac or respiratory ARREST Use medication by any route, position, wound care, and other measures to relive pain and suffering. May use oxygen, suction and manual treatment of airway obstruction as needed for comfort.      05/25/16 0718    Code Status History    Date Active Date Inactive Code Status Order ID Comments User Context   05/22/2016  3:22 PM 05/25/2016  7:18 AM DNR [id number]  Murlean Iba, MD Inpatient   05/20/2016  6:21 PM 05/22/2016  3:22 PM Full Code [id number]  Murlean Iba, MD Inpatient   12/31/2015 10:48 PM 01/02/2016  4:34 PM Full Code [id number]  Elberta Leatherwood, MD Inpatient       Prognosis:   < 12 months is possible.   Discharge Planning:  [facility] for rehab with Palliative care service follow-up  Care plan was discussed with patient, Dr Candiss Norse, case management.     Thank you for allowing the Palliative Medicine Team to assist in the care of this patient.   Time In: 11 Time Out: 1125 Total Time 25 Prolonged Time Billed  no        Greater than 50%  of this time was spent counseling and coordinating care related to the above assessment and plan.  Loistine Chance, MD [phone]  Please contact Palliative Medicine Team phone at [phone] for questions and concerns.

## 2016-05-30 NOTE — Evaluation (Signed)
Clinical/Bedside Swallow Evaluation Patient Details  Name: Cory Lawrence MRN: [id number] Date of Birth: [birth year]  Today's Date: 05/30/2016 Time: SLP Start Time (ACUTE ONLY): 1118 SLP Stop Time (ACUTE ONLY): 1139 SLP Time Calculation (min) (ACUTE ONLY): 21 min  Past Medical History:  Past Medical History:  Diagnosis Date  . Anxiety   . Arthritis   . Biliary cirrhosis ([place])   . BPH (benign prostatic hypertrophy)   . CAD (coronary artery disease)    prior abnormal Myoview; subsequent cath; managed medically  . Chronic anticoagulation   . COPD (chronic obstructive pulmonary disease) ([place])   . Diabetes mellitus type 2, diet-controlled ([place])    managed with diet  . Diaphoresis   . Diverticulosis of colon (without mention of hemorrhage)   . Dizziness   . GERD (gastroesophageal reflux disease)   . Heart attack ([place])   . Hyperlipidemia   . Hypertension   . Internal hemorrhoids without mention of complication   . NASH (nonalcoholic steatohepatitis)   . PAF (paroxysmal atrial fibrillation) ([facility])    on Xarelto  . Palpitations   . Personal history of colonic polyps 07/08/2002,06/2011   tubular adenoma, hyperplastic  . Ulcer    Gastric/bleeding  . Unspecified gastritis and gastroduodenitis without mention of hemorrhage   . Vitamin B12 deficiency    Past Surgical History:  Past Surgical History:  Procedure Laterality Date  . CARDIAC CATHETERIZATION  March 2013   Managed medically  . CATARACT EXTRACTION     bilateral  . EYE SURGERY     left eye x multiple  . INGUINAL HERNIA REPAIR Left 05/27/2016   Procedure: LEFT INGUINAL HERNIA REPAIR WITH MESH;  Surgeon: Armandina Gemma, MD;  Location: [facility];  Service: General;  Laterality: Left;  . INSERTION OF MESH Left 05/27/2016   Procedure: INSERTION OF MESH;  Surgeon: Armandina Gemma, MD;  Location: [facility];  Service: General;  Laterality: Left;   HPI:  pt is a 82 yo male adm to [facility] with left lower quadrant pain  and poor intake.  Pt found  to have possible left lobe pna - vs ATX.  BSE performed without s/s aspiration, slow mastication and Dys 3, thin liquids recommended. Follow up ST with goals met and discharged from [place]. He underwent hernia repair since discharge and repeat CXR revealed continued left lower lobe opacity. New left upper lobe airspace disease and repeat BSE ordered.     Assessment / Plan / Recommendation Clinical Impression  Pt is at high aspiration risk having history of esophageal dysphagia and cognitive impairments. Affirms slight sore throat following brief intubation Friday for surgery. Pt required max cues for small bites and to prolong mastication with solids, primarily meat due to rapid oral transit and inadequate/brief mastication. Noteable throat clearing/"hocking" appearing to clear pharyngeal or possible cervical esophageal residue (history of "erosion and stricture" per wife). No overt s/s aspiration. Recommend pt's meats/foods are cut into small pieces and encourage self feeding for increased safety, monitoring rate. Downgraded texture to Dys 3, continue thin, alternate liquids solids, small cup or straw sips, remain upright min 45 min after meals and rest breaks as needed due to visible fatigue during observation.       Aspiration Risk   (mod-severe)    Diet Recommendation Dysphagia 3 (Mech soft);Thin liquid   Liquid Administration via: Cup;Straw Medication Administration: Whole meds with puree Supervision: Patient able to self feed;Full supervision/cueing for compensatory strategies Compensations: Slow rate;Small sips/bites;Lingual sweep for clearance of pocketing;Follow solids with liquid (rest  breaks as needed) Postural Changes: Seated upright at 90 degrees;Remain upright for at least 30 minutes after po intake    Other  Recommendations Oral Care Recommendations: Oral care BID   Follow up Recommendations  None    Frequency and Duration min 2x/week  2 weeks       Prognosis Prognosis for Safe  Diet Advancement: Fair Barriers to Reach Goals: Cognitive deficits      Swallow Study   General HPI: pt is a 82 yo male adm to [facility] with left lower quadrant pain  and poor intake.  Pt found to have possible left lobe pna - vs ATX.  BSE performed without s/s aspiration, slow mastication and Dys 3, thin liquids recommended. Follow up ST with goals met and discharged from [place]. He underwent hernia repair since discharge and repeat CXR revealed continued left lower lobe opacity. New left upper lobe airspace disease and repeat BSE ordered.   Type of Study: Bedside Swallow Evaluation Previous Swallow Assessment:  (see HPI) Diet Prior to this Study: Regular;Thin liquids Temperature Spikes Noted: No Respiratory Status: Nasal cannula History of Recent Intubation: Yes Length of Intubations (days):  (hours during surgery) Date extubated: 05/27/16 Behavior/Cognition: Cooperative;Alert;Requires cueing Oral Cavity Assessment: Within Functional Limits Oral Care Completed by SLP: No Oral Cavity - Dentition:  (no lower partials, missing some lower, adequat upper) Vision: Functional for self-feeding Self-Feeding Abilities: Able to feed self;Needs assist;Needs set up Patient Positioning: Upright in chair Baseline Vocal Quality: Normal    Oral/Motor/Sensory Function Overall Oral Motor/Sensory Function:  (functional)   Ice Chips Ice chips: Not tested   Thin Liquid Thin Liquid: Impaired Presentation: Straw Pharyngeal  Phase Impairments: Wet Vocal Quality    Nectar Thick Nectar Thick Liquid: Not tested   Honey Thick Honey Thick Liquid: Not tested   Puree Puree: Not tested   Solid   GO   Solid: Impaired Oral Phase Impairments: Reduced lingual movement/coordination;Impaired mastication Oral Phase Functional Implications: Impaired mastication (decreased mastication with solid) Pharyngeal Phase Impairments: Suspected delayed Swallow        Houston Siren 05/30/2016,11:58 AM  Orbie Pyo Colvin Caroli.Ed [employer] [phone]

## 2016-05-30 NOTE — Progress Notes (Signed)
Physical Therapy Treatment Patient Details Name: Teondre Bellmore MRN: [id number] DOB: [birth year] Today's Date: 05/30/2016    History of Present Illness 82 yo male admitted with LLQ pain due to hernia. hx of COPD, HTN, DM, CAD, PAF, arthritis.     PT Comments    Patient initially not wanting to participate, gradually  Able to participate, emotional ABOUT HIS ELDERLY DOG WHO PASSED AWAY, CHEERFUL ABOUT HIS CAT. RECOMMEND SNF CONTINUE PT WHILE IN ACUTE CARE.  Follow Up Recommendations  SNF;Supervision/Assistance - 24 hour     Equipment Recommendations  None recommended by PT    Recommendations for Other Services       Precautions / Restrictions Precautions Precautions: Fall Precaution Comments: monitor sats Restrictions Weight Bearing Restrictions: No    Mobility  Bed Mobility               General bed mobility comments: in recliner  Transfers Overall transfer level: Needs assistance Equipment used: Rolling walker (2 wheeled) Transfers: Sit to/from Stand Sit to Stand: Mod assist;+2 safety/equipment         General transfer comment: with much time and encouragement, patient stood , verbal cues for hand placement  Ambulation/Gait Ambulation/Gait assistance: Min assist;+2 safety/equipment Ambulation Distance (Feet): 10 Feet Assistive device: Rolling walker (2 wheeled) Gait Pattern/deviations: Step-to pattern;Step-through pattern;Trunk flexed     General Gait Details: trunk quite flexed(scoliotic)   Stairs            Wheelchair Mobility    Modified Rankin (Stroke Patients Only)       Balance                                    Cognition Arousal/Alertness: Awake/alert Behavior During Therapy: Anxious Overall Cognitive Status: No family/caregiver present to determine baseline cognitive functioning Area of Impairment: Orientation       Following Commands: Follows one step commands with increased time     Problem  Solving: Requires verbal cues;Slow processing General Comments: patient oriented and perseverative about surgery. much time required to follow through with the activity, was somewhat beligerent but gradually warmed up to the therapists efforts and walked a little distance    Exercises      General Comments        Pertinent Vitals/Pain Pain Assessment: No/denies pain Faces Pain Scale: Hurts even more Pain Location: abdomen Pain Descriptors / Indicators: Discomfort;Guarding;Operative site guarding Pain Intervention(s): Limited activity within patient's tolerance;Monitored during session    Home Living                      Prior Function            PT Goals (current goals can now be found in the care plan section) Progress towards PT goals: Progressing toward goals    Frequency  Min 3X/week    PT Plan Current plan remains appropriate    Co-evaluation             End of Session Equipment Utilized During Treatment: Gait belt Activity Tolerance: Patient tolerated treatment well Patient left: in chair;with call bell/phone within reach;with chair alarm set     Time: 1345-1415 PT Time Calculation (min) (ACUTE ONLY): 30 min  Charges:  $Gait Training: 23-37 mins                    G Codes:      Kayshawn Ozburn, Shella Maxim  Tresa Endo PT [id number]  05/30/2016, 2:33 PM

## 2016-05-30 NOTE — Progress Notes (Signed)
PROGRESS NOTE                                                                                                                                                                                                             Patient Demographics:    Cory Lawrence, is a 82 y.o. male, DOB - 05-29-1925, [id number]  Admit date - 05/20/2016   Admitting Physician Murlean Iba, MD  Outpatient Primary MD for the patient is [name], [name], MD  LOS - 7  Chief Complaint  Patient presents with  . Hernia       Brief Narrative   82 year old Caucasian male with history of advanced dementia, paroxysmal atrial fibrillation [name] 2 score of at least 6, on xaralto, COPD, BPH, bilateral inguinal hernias, CAD, chronic combined systolic and diastolic CHF EF [id number], Nash, CK D stage III, DM type II, primary biliary cirrhosis who was admitted to the hospital with abdominal pain due to incarcerated hernias left more than right. He was also found to be in acute renal failure. General surgery palliative care following. Patient is DO NOT RESUSCITATE. Family is very reasonable, however in my opinion patient will have no chance of survival are good quality of life if we do not proceed with surgical correction of his incarcerated hernia. I have discussed this with general surgeon Dr. Wynonia Sours on 05/25/2016.  He shouldn't and wife clearly understand that he is a very high risk of adverse cardio pulmonary outcome and the accept this risk, they want to take the chance of undergoing surgery with at least some chance of pain-free and decent quality of life. They agree that if surgery does not go well patient might die or end up with residential hospice, for now plan was to defer surgery with residential hospice anyways.    Subjective:    Cory Lawrence today has, No headache, No chest pain, No abdominal pain - No Nausea, No new weakness tingling or numbness,  Positive cough and some shortness of breath developed on 05/29/2016.    Assessment  & Plan :     1.Bilateral incarcerated inguinal hernias left more than right. Gen. surgery consulted, after much deliberation patient underwent bilateral inguinal hernia repair surgery by Dr. Harlow Asa on 05/27/2016, seems to have tolerated the procedure well, we'll continue to monitor with supportive care. IS in place, will  need SNF.  2. CAD with chronic ischemic cardiomyopathy causing chronic combined systolic and diastolic heart failure EF 40%. Currently compensated. We appreciate cardiology input Continue Imdur and statin for secondary prevention.  3. Paroxysmal atrial fibrillation with [name] 2 score overtly 6. Currently in sinus bradycardia, not on any rate controlling medications except digoxin which has been held due to renal insufficiency, digoxin level is stable, xaralto currently on hold due to pending surgery. Platelets are borderline and I doubt bridging aggressively with heparin will provide any meaningful benefit.  4. Dyslipidemia. On statin.  5. NASH with primary biliary cirrhosis. Supportive care, continue Urosodiol.  6. Acute renal failure on CK D stage III. Baseline creatinine close to 1.4. Being hydrated renal function improving, will  Monitor bladder scan. Has condom catheter.  7. Advanced dementia, generalized deconditioning - I risk for delirium, continue supportive care with home dementia medications, patient and wife wish him to be DO NOT RESUSCITATE, they do not wish any unnecessary heroics. If stable will go to SNF if declines will go to residential hospice.  8. Essential hypertension. For now as needed IV hydralazine.  9. GERD. On PPI.  10. History of COPD. Stable continue supportive care with oxygen and nebulizer treatments as needed.  11. Chronic low-dose prednisone use. Wife unsure why he takes it, more stable, switch to 25 mg PO prednisone.  12. AOCD. Type screen will  transfuse 1 unit of packed RBC preop on 05/26/2016. Monitor H&H.  13. Urinary retention. Continue Cardura, Foley placed on 05/28/2016.   14. Left-sided pneumonia, suspicious for aspiration due to recent surgery and profound weakness and poor cough reflex. Therapy to evaluate, feeding assistance and aspiration precautions, sputum culture, supportive care with oxygen and nebulizer treatments, he did have significant cough early morning 05/29/2016 per wife. Placed on Unasyn for now and monitor.   15. DM type II. Monitor on sliding scale.  Lab Results  Component Value Date   HGBA1C 5.3 05/21/2016    CBG (last 3)   Recent Labs  05/30/16 0005 05/30/16 0338 05/30/16 0737  GLUCAP 132* 20 95      Family Communication  :  Wife  Code Status :  DNR  Diet : NPO for surgery then heart healthy as tolerated  Disposition Plan  :  Likely SNF    Consults  :  CCS, Pall Care, Cards  Procedures  :    TTE   Normal LV wall thickness with LVEF approximately 40-45%. Mid to basal inferolateral hypokinesis. Grade 1 diastolic dysfunction with normal estimated LV filling pressure. Mild left atrial enlargement. MAC with trivial mitral regurgitation. Mild to moderate calcific aortic stenosis. Trivial tricuspid regurgitation.  Bilateral Inguinal hernia surgery bilateral by Dr. Armandina Gemma on 05/27/2016.   DVT Prophylaxis  :   Heparin    Lab Results  Component Value Date   PLT 123 (L) 05/30/2016    Inpatient Medications  Scheduled Meds: . acetylcysteine  4 mL Nebulization Once  . antiseptic oral rinse  7 mL Mouth Rinse BID  . digoxin  0.125 mg Oral Daily  . donepezil  5 mg Oral QHS  . doxazosin  2 mg Oral QHS  . feeding supplement (ENSURE ENLIVE)  237 mL Oral TID BM  . feeding supplement (PRO-STAT SUGAR FREE 64)  30 mL Oral TID WC  . finasteride  5 mg Oral Daily  . fluticasone furoate-vilanterol  1 puff Inhalation Daily  . hydrocortisone sod succinate (SOLU-CORTEF) inj  50 mg Intravenous  Daily  .  imipenem-cilastatin  500 mg Intravenous Q12H  . insulin aspart  0-9 Units Subcutaneous Q4H  . isosorbide mononitrate  30 mg Oral Daily  . metoprolol tartrate  25 mg Oral BID  . pantoprazole  40 mg Oral Daily  . polyethylene glycol  17 g Oral Daily  . rivaroxaban  15 mg Oral Q supper  . senna-docusate  1 tablet Oral BID  . simvastatin  20 mg Oral QHS  . ursodiol  250 mg Oral BID   Continuous Infusions: . 0.9 % NaCl with KCl 40 mEq / L 50 mL/hr (05/30/16 0627)   PRN Meds:.acetaminophen **OR** [DISCONTINUED] acetaminophen, bisacodyl, diphenhydrAMINE, guaiFENesin-dextromethorphan, haloperidol, hydrALAZINE, HYDROcodone-acetaminophen, HYDROmorphone (DILAUDID) injection, ipratropium-albuterol, metoprolol, ondansetron (ZOFRAN) IV, [DISCONTINUED] ondansetron **OR** ondansetron (ZOFRAN) IV  Antibiotics  :    Anti-infectives    Start     Dose/Rate Route Frequency Ordered Stop   05/29/16 1200  imipenem-cilastatin (PRIMAXIN) 500 mg in sodium chloride 0.9 % 100 mL IVPB     500 mg 200 mL/hr over 30 Minutes Intravenous Every 12 hours 05/29/16 1142     05/27/16 0600  vancomycin (VANCOCIN) IVPB 1000 mg/200 mL premix     1,000 mg 200 mL/hr over 60 Minutes Intravenous On call to O.R. 05/26/16 1400 05/27/16 0932         Objective:   Vitals:   05/30/16 0434 05/30/16 0837 05/30/16 0936 05/30/16 0943  BP: (!) 152/47   (!) 151/45  Pulse: (!) 57  (!) 57   Resp: 18     Temp: 97.6 F (36.4 C)     TempSrc: Oral     SpO2: 97% 97%    Weight:      Height:        Wt Readings from Last 3 Encounters:  05/20/16 54.4 kg (120 lb)  03/24/16 56.9 kg (125 lb 6.4 oz)  02/11/16 57.6 kg (127 lb)     Intake/Output Summary (Last 24 hours) at 05/30/16 1010 Last data filed at 05/30/16 0700  Gross per 24 hour  Intake          2203.75 ml  Output              902 ml  Net          1301.75 ml     Physical Exam  Awake but Pleasantly confused, No new F.N deficits,   Inez.AT,PERRAL Supple Neck,No  JVD, No cervical lymphadenopathy appriciated.  Symmetrical Chest wall movement, Good air movement bilaterally, CTAB RRR,No Gallops,Rubs or new Murmurs, No Parasternal Heave +ve B.Sounds, Abd Soft, No tenderness, No organomegaly appriciated, No rebound - guarding or rigidity. No Cyanosis, Clubbing or edema, No new Rash or bruise  Bilat Ing hernia surgery scar under bandage.    Data Review:    CBC  Recent Labs Lab 05/25/16 0549 05/26/16 0529 05/26/16 1641 05/27/16 0528 05/28/16 0508 05/29/16 0503 05/30/16 0445  WBC 6.5 5.8  --  8.2 10.9* 9.0 10.0  HGB 9.0* 8.1* 10.1* 9.8* 10.3* 9.6* 9.7*  HCT 27.4* 24.0* 29.9* 28.3* 30.3* 29.1* 28.6*  PLT 100* 92*  --  96* 99* 102* 123*  MCV 91.9 90.9  --  90.1 91.3 91.5 91.4  MCH 30.2 30.7  --  31.2 31.0 30.2 31.0  MCHC 32.8 33.8  --  34.6 34.0 33.0 33.9  RDW 14.8 14.6  --  15.2 15.2 14.6 14.9  LYMPHSABS 1.0  --   --   --   --   --   --   MONOABS  0.4  --   --   --   --   --   --   EOSABS 0.1  --   --   --   --   --   --   BASOSABS 0.0  --   --   --   --   --   --     Chemistries   Recent Labs Lab 05/25/16 0549 05/26/16 0529 05/27/16 0528 05/28/16 0508 05/29/16 0503 05/30/16 0445  NA 139 142 139 137 134* 136  K 4.0 3.7 3.2* 4.2 3.8 3.5  CL 110 114* 109 107 104 107  CO2 24 24 24 25 25 24   GLUCOSE 100* 89 95 109* 110* 87  BUN 55* 47* 42* 41* 42* 38*  CREATININE 2.81* 1.65* 1.55* 1.67* 1.52* 1.22  CALCIUM 7.5* 7.4* 7.5* 7.5* 7.4* 7.3*  MG  --   --  1.7 1.9 2.2 1.8  AST 14*  --   --   --   --   --   ALT 12*  --   --   --   --   --   ALKPHOS 71  --   --   --   --   --   BILITOT 0.7  --   --   --   --   --    ------------------------------------------------------------------------------------------------------------------ No results for input(s): CHOL, HDL, LDLCALC, TRIG, CHOLHDL, LDLDIRECT in the last 72 hours.  Lab Results  Component Value Date   HGBA1C 5.3 05/21/2016    ------------------------------------------------------------------------------------------------------------------ No results for input(s): TSH, T4TOTAL, T3FREE, THYROIDAB in the last 72 hours.  Invalid input(s): FREET3 ------------------------------------------------------------------------------------------------------------------ No results for input(s): VITAMINB12, FOLATE, FERRITIN, TIBC, IRON, RETICCTPCT in the last 72 hours.  Coagulation profile  Recent Labs Lab 05/25/16 1038  INR 1.32    No results for input(s): DDIMER in the last 72 hours.  Cardiac Enzymes No results for input(s): CKMB, TROPONINI, MYOGLOBIN in the last 168 hours.  Invalid input(s): CK ------------------------------------------------------------------------------------------------------------------    Component Value Date/Time   BNP 116.5 (H) 12/31/2015 1720    Micro Results Recent Results (from the past 240 hour(s))  Surgical pcr screen     Status: None   Collection Time: 05/26/16  5:44 PM  Result Value Ref Range Status   MRSA, PCR NEGATIVE NEGATIVE Final   Staphylococcus aureus NEGATIVE NEGATIVE Final    Comment:        The Xpert SA Assay (FDA approved for NASAL specimens in patients over 17 years of age), is one component of a comprehensive surveillance program.  Test performance has been validated by [facility] for patients greater than or equal to 14 year old. It is not intended to diagnose infection nor to guide or monitor treatment.     Radiology Reports Dg Chest 2 View  Result Date: 05/05/2016 CLINICAL DATA:  Chest pain, left flank pain, fall 5 days ago EXAM: CHEST  2 VIEW COMPARISON:  12/31/2015 FINDINGS: Cardiomediastinal silhouette is stable. Hyperinflation again noted. Stable chronic interstitial prominence. There is left basilar streaky atelectasis or early infiltrate. No pulmonary edema. Osteopenia and degenerative changes thoracic spine. Atherosclerotic calcifications of  thoracic aorta. No pneumothorax. IMPRESSION: Hyperinflation. Chronic interstitial prominence. No pulmonary edema. Streaky left basilar atelectasis or early infiltrate. No pneumothorax. Electronically Signed   By: Lahoma Crocker M.D.   On: 05/05/2016 13:47   Dg Ribs Unilateral W/chest Left  Result Date: 05/05/2016 CLINICAL DATA:  Left-sided chest pain, fell 5 days ago EXAM: LEFT RIBS AND  CHEST - 3+ VIEW COMPARISON:  Chest x-ray of 12/31/2015 FINDINGS: On the images obtained, no acute left rib fracture is seen. The current images are very obliqued and standard rib detail views would be helpful. The lungs appear to be hyperaerated consistent with an element of emphysema. IMPRESSION: 1. No acute left rib fracture seen on the images obtained. Please see above. 2. Emphysema. Electronically Signed   By: Ivar Drape M.D.   On: 05/05/2016 13:47   Dg Chest Port 1 View  Result Date: 05/29/2016 CLINICAL DATA:  Cough, shortness of breath, weakness EXAM: PORTABLE CHEST 1 VIEW COMPARISON:  05/28/2016 FINDINGS: Patchy opacities within the left upper lobe and left base concerning for pneumonia. Hyperinflation compatible with COPD. Heart is borderline in size. Suspect small effusions. IMPRESSION: Continued left lower lobe opacity. New left upper lobe airspace disease. Findings concerning for pneumonia. Small effusions. COPD. Electronically Signed   By: Rolm Baptise M.D.   On: 05/29/2016 10:56  Dg Chest Port 1 View  Result Date: 05/28/2016 CLINICAL DATA:  Acute shortness of breath and hypoxia today. EXAM: PORTABLE CHEST 1 VIEW COMPARISON:  05/20/2016 and prior radiographs FINDINGS: Cardiomegaly again noted. Left lower lung atelectasis versus airspace disease noted. Trace bilateral pleural effusions are present. There is no evidence of pneumothorax or pulmonary edema. No acute bony abnormalities are identified. IMPRESSION: Left lower lung atelectasis versus airspace disease. Pneumonia not excluded. Trace bilateral pleural  effusions. Electronically Signed   By: Margarette Canada M.D.   On: 05/28/2016 10:56  Dg Abd Acute W/chest  Result Date: 05/20/2016 CLINICAL DATA:  Left lower abdominal pain for 4-5 days EXAM: DG ABDOMEN ACUTE W/ 1V CHEST COMPARISON:  05/16/2016 FINDINGS: There is no evidence of dilated bowel loops or free intraperitoneal air. No radiopaque calculi or other significant radiographic abnormality is seen. Heart size and mediastinal contours are within normal limits. The lungs are hyperinflated likely secondary to COPD. IMPRESSION: Negative abdominal radiographs.  No acute cardiopulmonary disease. Electronically Signed   By: Kathreen Devoid   On: 05/20/2016 15:37   Dg Abd Acute W/chest  Result Date: 05/16/2016 CLINICAL DATA:  82 year old male with history of bilateral inguinal hernias and discomfort for the past 3 days EXAM: DG ABDOMEN ACUTE W/ 1V CHEST COMPARISON:  Chest x-ray 05/05/2016. FINDINGS: Lung volumes appear slightly increased and there are mild emphysematous changes noted. Architectural distortion at the base of the left lung, similar to prior studies, favored to reflect areas of chronic scarring. Small bilateral pleural effusions. No evidence of pulmonary edema. No suspicious appearing pulmonary nodules or masses. Heart size is normal. Upper mediastinal contours are within normal limits. Aortic atherosclerosis. Gas and stool are noted throughout the colon extending to the level of the distal rectum. No pathologic dilatation of small bowel is noted. Several nondilated gas-filled loops of small bowel are noted, predominantly in the left side of the abdomen. No pneumoperitoneum. IMPRESSION: 1. Nonobstructive bowel gas pattern. 2. No pneumoperitoneum. 3. Small bilateral pleural effusions. 4. Probable scarring the left lung base. 5. Aortic atherosclerosis. Electronically Signed   By: Vinnie Langton M.D.   On: 05/16/2016 15:32    Time Spent in minutes  30   Yumiko Alkins K M.D on 05/30/2016 at 10:10  AM  Between 7am to 7pm - Pager - [phone]  After 7pm go to [email or web address] - password [facility]  [facility] -  Office  [phone]

## 2016-05-30 NOTE — Progress Notes (Signed)
Voiding trail done only able to get 150cc fluid in bladder and patient stated he has to urinate, foley removed and patient incontinent of small urine on pad, reported to night shift nurse to F/U with plan of care.

## 2016-05-30 NOTE — Progress Notes (Signed)
Patient refused to eat dinner, stated he wants to wait for his wife.

## 2016-05-31 ENCOUNTER — Encounter (HOSPITAL_COMMUNITY): Payer: Self-pay

## 2016-05-31 ENCOUNTER — Inpatient Hospital Stay (HOSPITAL_COMMUNITY): Payer: Medicare Other

## 2016-05-31 LAB — GLUCOSE, CAPILLARY
GLUCOSE-CAPILLARY: 103 mg/dL — AB (ref 65–99)
GLUCOSE-CAPILLARY: 117 mg/dL — AB (ref 65–99)
GLUCOSE-CAPILLARY: 152 mg/dL — AB (ref 65–99)
GLUCOSE-CAPILLARY: 156 mg/dL — AB (ref 65–99)
GLUCOSE-CAPILLARY: 79 mg/dL (ref 65–99)
Glucose-Capillary: 79 mg/dL (ref 65–99)

## 2016-05-31 MED ORDER — FUROSEMIDE 10 MG/ML IJ SOLN
40.0000 mg | Freq: Once | INTRAMUSCULAR | Status: AC
  Administered 2016-05-31: 40 mg via INTRAVENOUS
  Filled 2016-05-31: qty 4

## 2016-05-31 MED ORDER — ACETYLCYSTEINE 20 % IN SOLN
4.0000 mL | Freq: Once | RESPIRATORY_TRACT | Status: AC
  Administered 2016-05-31: 4 mL via RESPIRATORY_TRACT
  Filled 2016-05-31: qty 4

## 2016-05-31 NOTE — Progress Notes (Signed)
PROGRESS NOTE                                                                                                                                                                                                             Patient Demographics:    Cory Lawrence, is a 82 y.o. male, DOB - Mar 05, 1925, [id number]  Admit date - 05/20/2016   Admitting Physician Murlean Iba, MD  Outpatient Primary MD for the patient is [name], [name], MD  LOS - 7  Chief Complaint  Patient presents with  . Hernia       Brief Narrative   82 year old Caucasian male with history of advanced dementia, paroxysmal atrial fibrillation Cory Lawrence score of at least 6, on xaralto, COPD, BPH, bilateral inguinal hernias, CAD, chronic combined systolic and diastolic CHF EF [id number], Nash, CK D stage III, DM type II, primary biliary cirrhosis who was admitted to the hospital with abdominal pain due to incarcerated hernias left more than right. He was also found to be in acute renal failure. General surgery palliative care following. Patient is DO NOT RESUSCITATE. Family is very reasonable, however in my opinion patient will have no chance of survival are good quality of life if we do not proceed with surgical correction of his incarcerated hernia. I have discussed this with general surgeon Dr. Wynonia Sours on 05/25/2016.  Patientt and wife clearly understood that he is a very high risk of adverse cardio pulmonary outcome and the accept this risk, they want to take the chance of undergoing surgery with at least some chance of pain-free and decent quality of life. They agree that if surgery does not go well patient might die or end up with residential hospice.  With that understanding we proceeded to bilateral inguinal hernia repair surgery on 05/27/2016, he tolerated the surgery except he developed aspiration pneumonia during the perioperative period, is currently on  antibiotics for pneumonia clinically getting better, still frail, likely will require SNF in a few days. If declines family is open for residential hospice. Palliative care following as well along with general surgery.    Subjective:    Cory Lawrence today has, No headache, No chest pain, No abdominal pain - No Nausea, No new weakness tingling or numbness, Positive cough and some shortness of breath developed on 05/29/2016 now gradually improving.  Assessment  & Plan :     1.Bilateral incarcerated inguinal hernias left more than right. Gen. surgery consulted, after much deliberation patient underwent bilateral inguinal hernia repair surgery by Dr. Harlow Asa on 05/27/2016, seems to have tolerated the procedure well, we'll continue to monitor with supportive care. IS in place, will need SNF.  Lawrence. CAD with chronic ischemic cardiomyopathy causing chronic combined systolic and diastolic heart failure EF 40%. Currently compensated. We appreciate cardiology input Continue Imdur and statin for secondary prevention.  3. Paroxysmal atrial fibrillation with Cory Lawrence score overtly 6. He is on digoxin, beta blocker and xaralto. Was seen by cardiology prior to surgery.  4. Dyslipidemia. On statin.  5. NASH with primary biliary cirrhosis. Supportive care, continue Urosodiol.  6. Acute renal failure on CK D stage III. Baseline creatinine close to 1.4. Being hydrated renal function improving, will monitor bladder scan. Has condom catheter.  7. Advanced dementia, generalized deconditioning - and tinea supportive care, remains at risk for developing delirium, patient and wife wish him to be DO NOT RESUSCITATE, they do not wish any unnecessary heroics. If stable will go to SNF if declines will go to residential hospice.  8. Essential hypertension. For now as needed IV hydralazine.  9. GERD. On PPI.  10. History of COPD. Stable continue supportive care with oxygen and nebulizer treatments as needed.  11.  Chronic low-dose prednisone use. Wife unsure why he takes it, briefly on low-dose hydrocortisone now switched to 25 mg PO prednisone. Can be placed back on his home dose upon discharge.  12. AOCD. Type screen will transfuse 1 unit of packed RBC preop on 05/26/2016. Antineutrophil intermittently monitor H&H.  13. Urinary retention. Continue Cardura, Foley placed on 05/28/2016 and removed after he passed voiding trial on 05/30/2016.  14. Left-sided pneumonia, suspicious for aspiration due to recent surgery and profound weakness and poor cough reflex. Speech Therapy following on D3 diet, feeding assistance and aspiration precautions, sputum culture, supportive care with oxygen and nebulizer treatments, he did have significant cough early morning 05/29/2016 per wife. Placed on Primaxin, added flutter valve and incentive spirometer, encouraged to sit up in the chair, Mucomyst added 1 dose on 05/31/2016 to assist secretion removal respiratory therapist counseled bedside.   15. DM type II. Monitor on sliding scale.  Lab Results  Component Value Date   HGBA1C 5.3 05/21/2016    CBG (last 3)   Recent Labs  05/31/16 0002 05/31/16 0359 05/31/16 0748  GLUCAP 117* 79 79      Family Communication  :  Wife  Code Status :  DNR  Diet : D3 diet  Disposition Plan  :  Likely SNF    Consults  :  CCS, Pall Care, Cards  Procedures  :    TTE   Normal LV wall thickness with LVEF approximately 40-45%. Mid to basal inferolateral hypokinesis. Grade 1 diastolic dysfunction with normal estimated LV filling pressure. Mild left atrial enlargement. MAC with trivial mitral regurgitation. Mild to moderate calcific aortic stenosis. Trivial tricuspid regurgitation.  Bilateral Inguinal hernia surgery bilateral by Dr. Armandina Gemma on 05/27/2016.   DVT Prophylaxis  :   Xaralto    Lab Results  Component Value Date   PLT 123 (L) 05/30/2016    Inpatient Medications  Scheduled Meds: . acetylcysteine  4 mL  Nebulization Once  . antiseptic oral rinse  7 mL Mouth Rinse BID  . digoxin  0.125 mg Oral Daily  . donepezil  5 mg Oral QHS  .  doxazosin  Lawrence mg Oral QHS  . feeding supplement (ENSURE ENLIVE)  237 mL Oral TID BM  . feeding supplement (PRO-STAT SUGAR FREE 64)  30 mL Oral TID WC  . finasteride  5 mg Oral Daily  . fluticasone furoate-vilanterol  1 puff Inhalation Daily  . hydrocortisone sod succinate (SOLU-CORTEF) inj  50 mg Intravenous Daily  . imipenem-cilastatin  500 mg Intravenous Q12H  . insulin aspart  0-9 Units Subcutaneous Q4H  . isosorbide mononitrate  30 mg Oral Daily  . metoprolol tartrate  12.5 mg Oral BID  . pantoprazole  40 mg Oral Daily  . polyethylene glycol  17 g Oral Daily  . rivaroxaban  15 mg Oral Q supper  . senna-docusate  1 tablet Oral BID  . simvastatin  20 mg Oral QHS  . ursodiol  250 mg Oral BID   Continuous Infusions: . 0.9 % NaCl with KCl 40 mEq / L 50 mL/hr (05/31/16 0003)   PRN Meds:.acetaminophen **OR** [DISCONTINUED] acetaminophen, bisacodyl, diphenhydrAMINE, guaiFENesin-dextromethorphan, haloperidol, hydrALAZINE, HYDROcodone-acetaminophen, HYDROmorphone (DILAUDID) injection, ipratropium-albuterol, metoprolol, ondansetron (ZOFRAN) IV, [DISCONTINUED] ondansetron **OR** ondansetron (ZOFRAN) IV  Antibiotics  :    Anti-infectives    Start     Dose/Rate Route Frequency Ordered Stop   05/29/16 1200  imipenem-cilastatin (PRIMAXIN) 500 mg in sodium chloride 0.9 % 100 mL IVPB     500 mg 200 mL/hr over 30 Minutes Intravenous Every 12 hours 05/29/16 1142     05/27/16 0600  vancomycin (VANCOCIN) IVPB 1000 mg/200 mL premix     1,000 mg 200 mL/hr over 60 Minutes Intravenous On call to O.R. 05/26/16 1400 05/27/16 0932         Objective:   Vitals:   05/30/16 2133 05/30/16 2143 05/31/16 0404 05/31/16 0903  BP:   (!) 153/42   Pulse:  64 (!) 51   Resp:   18   Temp:   97.9 F (36.6 C)   TempSrc:   Oral   SpO2: 95%  97% 96%  Weight:      Height:        Wt  Readings from Last 3 Encounters:  05/20/16 54.4 kg (120 lb)  03/24/16 56.9 kg (125 lb 6.4 oz)  02/11/16 57.6 kg (127 lb)     Intake/Output Summary (Last 24 hours) at 05/31/16 0935 Last data filed at 05/31/16 0900  Gross per 24 hour  Intake             2000 ml  Output             1800 ml  Net              200 ml     Physical Exam  Awake but Pleasantly confused, No new F.N deficits,   [place].AT,PERRAL Supple Neck,No JVD, No cervical lymphadenopathy appriciated.  Symmetrical Chest wall movement, Good air movement bilaterally, Coarse B sounds L base RRR,No Gallops,Rubs or new Murmurs, No Parasternal Heave +ve B.Sounds, Abd Soft, No tenderness, No organomegaly appriciated, No rebound - guarding or rigidity. No Cyanosis, Clubbing or edema, No new Rash or bruise  Bilat Ing hernia surgery scar under bandage.    Data Review:    CBC  Recent Labs Lab 05/25/16 0549 05/26/16 0529 05/26/16 1641 05/27/16 0528 05/28/16 0508 05/29/16 0503 05/30/16 0445  WBC 6.5 5.8  --  8.Lawrence 10.9* 9.0 10.0  HGB 9.0* 8.1* 10.1* 9.8* 10.3* 9.6* 9.7*  HCT 27.4* 24.0* 29.9* 28.3* 30.3* 29.1* 28.6*  PLT 100* 92*  --  96* 99* 102* 123*  MCV 91.9 90.9  --  90.1 91.3 91.5 91.4  MCH 30.Lawrence 30.7  --  31.Lawrence 31.0 30.Lawrence 31.0  MCHC 32.8 33.8  --  34.6 34.0 33.0 33.9  RDW 14.8 14.6  --  15.Lawrence 15.Lawrence 14.6 14.9  LYMPHSABS 1.0  --   --   --   --   --   --   MONOABS 0.4  --   --   --   --   --   --   EOSABS 0.1  --   --   --   --   --   --   BASOSABS 0.0  --   --   --   --   --   --     Chemistries   Recent Labs Lab 05/25/16 0549 05/26/16 0529 05/27/16 0528 05/28/16 0508 05/29/16 0503 05/30/16 0445  NA 139 142 139 137 134* 136  K 4.0 3.7 3.Lawrence* 4.Lawrence 3.8 3.5  CL 110 114* 109 107 104 107  CO2 24 24 24 25 25 24   GLUCOSE 100* 89 95 109* 110* 87  BUN 55* 47* 42* 41* 42* 38*  CREATININE Lawrence.81* 1.65* 1.55* 1.67* 1.52* 1.22  CALCIUM 7.5* 7.4* 7.5* 7.5* 7.4* 7.3*  MG  --   --  1.7 1.9 Lawrence.Lawrence 1.8  AST 14*  --   --   --    --   --   ALT 12*  --   --   --   --   --   ALKPHOS 71  --   --   --   --   --   BILITOT 0.7  --   --   --   --   --    ------------------------------------------------------------------------------------------------------------------ No results for input(s): CHOL, HDL, LDLCALC, TRIG, CHOLHDL, LDLDIRECT in the last 72 hours.  Lab Results  Component Value Date   HGBA1C 5.3 05/21/2016   ------------------------------------------------------------------------------------------------------------------ No results for input(s): TSH, T4TOTAL, T3FREE, THYROIDAB in the last 72 hours.  Invalid input(s): FREET3 ------------------------------------------------------------------------------------------------------------------ No results for input(s): VITAMINB12, FOLATE, FERRITIN, TIBC, IRON, RETICCTPCT in the last 72 hours.  Coagulation profile  Recent Labs Lab 05/25/16 1038  INR 1.32    No results for input(s): DDIMER in the last 72 hours.  Cardiac Enzymes No results for input(s): CKMB, TROPONINI, MYOGLOBIN in the last 168 hours.  Invalid input(s): CK ------------------------------------------------------------------------------------------------------------------    Component Value Date/Time   BNP 116.5 (H) 12/31/2015 1720    Micro Results Recent Results (from the past 240 hour(s))  Surgical pcr screen     Status: None   Collection Time: 05/26/16  5:44 PM  Result Value Ref Range Status   MRSA, PCR NEGATIVE NEGATIVE Final   Staphylococcus aureus NEGATIVE NEGATIVE Final    Comment:        The Xpert SA Assay (FDA approved for NASAL specimens in patients over 20 years of age), is one component of a comprehensive surveillance program.  Test performance has been validated by [facility] for patients greater than or equal to 76 year old. It is not intended to diagnose infection nor to guide or monitor treatment.     Radiology Reports Dg Chest Lawrence View  Result Date:  05/05/2016 CLINICAL DATA:  Chest pain, left flank pain, fall 5 days ago EXAM: CHEST  Lawrence VIEW COMPARISON:  12/31/2015 FINDINGS: Cardiomediastinal silhouette is stable. Hyperinflation again noted. Stable chronic interstitial prominence. There is left basilar streaky atelectasis or early infiltrate. No  pulmonary edema. Osteopenia and degenerative changes thoracic spine. Atherosclerotic calcifications of thoracic aorta. No pneumothorax. IMPRESSION: Hyperinflation. Chronic interstitial prominence. No pulmonary edema. Streaky left basilar atelectasis or early infiltrate. No pneumothorax. Electronically Signed   By: Lahoma Crocker M.D.   On: 05/05/2016 13:47   Dg Ribs Unilateral W/chest Left  Result Date: 05/05/2016 CLINICAL DATA:  Left-sided chest pain, fell 5 days ago EXAM: LEFT RIBS AND CHEST - 3+ VIEW COMPARISON:  Chest x-ray of 12/31/2015 FINDINGS: On the images obtained, no acute left rib fracture is seen. The current images are very obliqued and standard rib detail views would be helpful. The lungs appear to be hyperaerated consistent with an element of emphysema. IMPRESSION: 1. No acute left rib fracture seen on the images obtained. Please see above. Lawrence. Emphysema. Electronically Signed   By: Ivar Drape M.D.   On: 05/05/2016 13:47   Dg Chest Port 1 View  Result Date: 05/31/2016 CLINICAL DATA:  Congestive cough with shortness of breath EXAM: PORTABLE CHEST 1 VIEW COMPARISON:  05/29/2016 FINDINGS: Cardiac shadow is stable. Persistent left basilar infiltrate with associated effusion is noted. Blunting of the right costophrenic angle is noted. There is been some clearing in the left mid lung when compared with the prior exam. IMPRESSION: Persistent changes in the left base although interval clearing in the left mid lung is noted. Electronically Signed   By: Inez Catalina M.D.   On: 05/31/2016 09:27   Dg Chest Port 1 View  Result Date: 05/29/2016 CLINICAL DATA:  Cough, shortness of breath, weakness EXAM: PORTABLE  CHEST 1 VIEW COMPARISON:  05/28/2016 FINDINGS: Patchy opacities within the left upper lobe and left base concerning for pneumonia. Hyperinflation compatible with COPD. Heart is borderline in size. Suspect small effusions. IMPRESSION: Continued left lower lobe opacity. New left upper lobe airspace disease. Findings concerning for pneumonia. Small effusions. COPD. Electronically Signed   By: Rolm Baptise M.D.   On: 05/29/2016 10:56  Dg Chest Port 1 View  Result Date: 05/28/2016 CLINICAL DATA:  Acute shortness of breath and hypoxia today. EXAM: PORTABLE CHEST 1 VIEW COMPARISON:  05/20/2016 and prior radiographs FINDINGS: Cardiomegaly again noted. Left lower lung atelectasis versus airspace disease noted. Trace bilateral pleural effusions are present. There is no evidence of pneumothorax or pulmonary edema. No acute bony abnormalities are identified. IMPRESSION: Left lower lung atelectasis versus airspace disease. Pneumonia not excluded. Trace bilateral pleural effusions. Electronically Signed   By: Margarette Canada M.D.   On: 05/28/2016 10:56  Dg Abd Acute W/chest  Result Date: 05/20/2016 CLINICAL DATA:  Left lower abdominal pain for 4-5 days EXAM: DG ABDOMEN ACUTE W/ 1V CHEST COMPARISON:  05/16/2016 FINDINGS: There is no evidence of dilated bowel loops or free intraperitoneal air. No radiopaque calculi or other significant radiographic abnormality is seen. Heart size and mediastinal contours are within normal limits. The lungs are hyperinflated likely secondary to COPD. IMPRESSION: Negative abdominal radiographs.  No acute cardiopulmonary disease. Electronically Signed   By: Kathreen Devoid   On: 05/20/2016 15:37   Dg Abd Acute W/chest  Result Date: 05/16/2016 CLINICAL DATA:  82 year old male with history of bilateral inguinal hernias and discomfort for the past 3 days EXAM: DG ABDOMEN ACUTE W/ 1V CHEST COMPARISON:  Chest x-ray 05/05/2016. FINDINGS: Lung volumes appear slightly increased and there are mild  emphysematous changes noted. Architectural distortion at the base of the left lung, similar to prior studies, favored to reflect areas of chronic scarring. Small bilateral pleural effusions. No evidence of pulmonary  edema. No suspicious appearing pulmonary nodules or masses. Heart size is normal. Upper mediastinal contours are within normal limits. Aortic atherosclerosis. Gas and stool are noted throughout the colon extending to the level of the distal rectum. No pathologic dilatation of small bowel is noted. Several nondilated gas-filled loops of small bowel are noted, predominantly in the left side of the abdomen. No pneumoperitoneum. IMPRESSION: 1. Nonobstructive bowel gas pattern. Lawrence. No pneumoperitoneum. 3. Small bilateral pleural effusions. 4. Probable scarring the left lung base. 5. Aortic atherosclerosis. Electronically Signed   By: Vinnie Langton M.D.   On: 05/16/2016 15:32    Time Spent in minutes  30   Berkley Cronkright K M.D on 05/31/2016 at 9:35 AM  Between 7am to 7pm - Pager - [phone]  After 7pm go to [email or web address] - password [facility]  [facility] -  Office  [phone]

## 2016-05-31 NOTE — Progress Notes (Signed)
SLP Cancellation Note  Patient Details Name: Cory Lawrence MRN: [id number] DOB: 1925-07-09   Cancelled treatment:       Reason Eval/Treat Not Completed: Other (comment) (pt currently asleep; will continue efforts)  Luanna Salk, [facility] SLP [phone]

## 2016-05-31 NOTE — Progress Notes (Addendum)
Occupational Therapy Treatment Patient Details Name: Cory Lawrence MRN: [id number] DOB: Jun 03, 1925 Today's Date: 05/31/2016    History of present illness 82 yo male admitted with LLQ pain due to hernia. hx of COPD, HTN, DM, CAD, PAF, arthritis.    OT comments  Issued weighted cup and weighted spoon. Also performed sit to stand for weight shift, but pt nervous and resisting  Follow Up Recommendations  SNF    Equipment Recommendations   (defer to SNF)    Recommendations for Other Services      Precautions / Restrictions Precautions Precautions: Fall Restrictions Weight Bearing Restrictions: No       Mobility Bed Mobility                  Transfers       Sit to Stand: Mod assist;+2 physical assistance         General transfer comment: assist to rise and stabilize    Balance                                   ADL   Eating/Feeding: Supervision/ safety;Set up                                     General ADL Comments: provided weighted spoon and weighted up.  Pt able to self feed sitting up in chair.  Cues to clear throat.  NT assisted with +2 sit to stand assist for weight shifting as pt had sat up in chair for several hours.  Pt was resistant/afraid        Vision                     Perception     Praxis      Cognition   Behavior During Therapy: Anxious Overall Cognitive Status: No family/caregiver present to determine baseline cognitive functioning          Following Commands: Follows one step commands with increased time   Awareness: Intellectual   General Comments: pt very fearful of falling; resisted     Extremity/Trunk Assessment               Exercises     Shoulder Instructions       General Comments      Pertinent Vitals/ Pain       Pain Assessment: Faces Faces Pain Scale: Hurts even more Pain Location: abdomen Pain Descriptors / Indicators: Aching Pain Intervention(s):  Limited activity within patient's tolerance;Monitored during session;Repositioned  Home Living                                          Prior Functioning/Environment              Frequency Min 2X/week     Progress Toward Goals  OT Goals(current goals can now be found in the care plan section)  Progress towards OT goals: Progressing toward goals  Acute Rehab OT Goals Patient Stated Goal: none stated  Plan      Co-evaluation                 End of Session     Activity Tolerance     Patient Left  Nurse Communication          Time:  - [id number] 32 minutes, PA came in during session    Charges: OT General Charges $OT Visit: 1 Procedure OT Treatments $Self Care/Home Management : 8-22 mins $Therapeutic Activity: 8-22 mins  Zykiria Bruening 05/31/2016, 4:15 PM Lesle Chris, OTR/L [phone] 05/31/2016

## 2016-05-31 NOTE — Progress Notes (Signed)
Nutrition Follow-up  DOCUMENTATION CODES:   Non-severe (moderate) malnutrition in context of acute illness/injury  INTERVENTION:   Continue Ensure Enlive po TID, each supplement provides 350 kcal and 20 grams of protein Continue Prostat liquid protein PO 30 ml TID with meals, each supplement provides 100 kcal, 15 grams protein. RD to continue to monitor for additional needs  NUTRITION DIAGNOSIS:   Inadequate oral intake related to inability to eat, acute illness as evidenced by meal completion < 50%.  Ongoing.  GOAL:   Patient will meet greater than or equal to 90% of their needs  Progressing.  MONITOR:   PO intake, Diet advancement, Supplement acceptance, Weight trends, Labs, Skin, I & O's  ASSESSMENT:   82 y.o. male with a complex PMH detailed below presented to the ED for the second time in the last several days with severe recurrent LLQ abdominal pain related to bilateral inguinal hernias complicated by chronic constipation and generalized weakness from poor oral intake.  The patient and his wife report that 5-6 days ago the patient picked up something at home and afterwards had severe pain mostly on the left side lower quadrant of abdomen. He reports that his pain is in his inguinal region and that he noted that his hernia had protruded and felt hard like a baseball. He was seen 4 days ago in the emergency department for this issue and per notes had his hernia successfully reduced and was sent  Home with outpatient surgical follow-up. The patient and his wife report that it came right back out and the patient has been in severe pain over the last few days at home. He has had very little appetite and has not been able to eat much. He has only had 1 bowel movement over this time. His wife said she had to give him multiple laxatives in order for him to have a bowel movement.  PO intakes have varied between 55-70% of meals, pt is on a dysphagia 3 diet. Per SLP note 7/31: pt with  moderate to severe aspiration risk. Pt with sore throat after intubation on Friday for surgery. Pt is drinking Ensure supplements and taking Prostat.   Labs reviewed. Medications reviewed.  Diet Order:  DIET DYS 3 Room service appropriate? Yes; Fluid consistency: Thin  Skin:  Wound (see comment) (L groin incision from 05/27/16)  Last BM:  8/1  Height:   Ht Readings from Last 1 Encounters:  05/20/16 5\' 5"  (1.651 m)    Weight:   Wt Readings from Last 1 Encounters:  05/20/16 120 lb (54.4 kg)    Ideal Body Weight:  61.8 kg  BMI:  Body mass index is 19.97 kg/m.  Estimated Nutritional Needs:   Kcal:  1750-1900  Protein:  60g  Fluid:  2 L/day  EDUCATION NEEDS:   Education needs no appropriate at this time  Clayton Bibles, MS, RD, LDN Pager: [phone] After Hours Pager: [phone]

## 2016-05-31 NOTE — Progress Notes (Addendum)
Daily Progress Note   Patient Name: Cory Lawrence       Date: 05/31/2016 DOB: [birth year]  Age: 82 y.o. MRN#: [id number] Attending Physician: Thurnell Lose, MD Primary Care Physician: [name], MD Admit Date: 05/20/2016  Reason for Consultation/Follow-up: Establishing goals of care  Subjective: Complains of abdominal pain. Reports that they fixed 1 of his 2 hernias.  Unable to walk due to pain.  Wife reports he has intermittent confusion (worse at night) and that now he has pneumonia.   Length of Stay: 7  Current Medications: Scheduled Meds:  . antiseptic oral rinse  7 mL Mouth Rinse BID  . digoxin  0.125 mg Oral Daily  . donepezil  5 mg Oral QHS  . doxazosin  2 mg Oral QHS  . feeding supplement (ENSURE ENLIVE)  237 mL Oral TID BM  . feeding supplement (PRO-STAT SUGAR FREE 64)  30 mL Oral TID WC  . finasteride  5 mg Oral Daily  . fluticasone furoate-vilanterol  1 puff Inhalation Daily  . hydrocortisone sod succinate (SOLU-CORTEF) inj  50 mg Intravenous Daily  . imipenem-cilastatin  500 mg Intravenous Q12H  . insulin aspart  0-9 Units Subcutaneous Q4H  . isosorbide mononitrate  30 mg Oral Daily  . metoprolol tartrate  12.5 mg Oral BID  . pantoprazole  40 mg Oral Daily  . polyethylene glycol  17 g Oral Daily  . rivaroxaban  15 mg Oral Q supper  . senna-docusate  1 tablet Oral BID  . simvastatin  20 mg Oral QHS  . ursodiol  250 mg Oral BID    Continuous Infusions: . 0.9 % NaCl with KCl 40 mEq / L 50 mL/hr (05/31/16 0003)    PRN Meds: acetaminophen **OR** [DISCONTINUED] acetaminophen, bisacodyl, diphenhydrAMINE, guaiFENesin-dextromethorphan, haloperidol, hydrALAZINE, HYDROcodone-acetaminophen, HYDROmorphone (DILAUDID) injection, ipratropium-albuterol, metoprolol,  ondansetron (ZOFRAN) IV, [DISCONTINUED] ondansetron **OR** ondansetron (ZOFRAN) IV  Physical Exam         Elderly gentleman, appears slightly jaundiced.  Contrary. NAD S1 S2 Lungs sound wet No edema Awake alert  Vital Signs: BP (!) 137/52 (BP Location: Right Arm)   Pulse 60   Temp 98.3 F (36.8 C) (Oral)   Resp 20   Ht '5\' 5"'$  (1.651 m)   Wt 54.4 kg (120 lb)   SpO2 97%   BMI 19.97  kg/m  SpO2: SpO2: 97 % O2 Device: O2 Device: Nasal Cannula O2 Flow Rate: O2 Flow Rate (L/min): 2 L/min  Intake/output summary:   Intake/Output Summary (Last 24 hours) at 05/31/16 1603 Last data filed at 05/31/16 1345  Gross per 24 hour  Intake             2000 ml  Output             1900 ml  Net              100 ml   LBM: Last BM Date: 05/31/16 Baseline Weight: Weight: 54.4 kg (120 lb) Most recent weight: Weight: 54.4 kg (120 lb)       Palliative Assessment/Data:    Flowsheet Rows   Flowsheet Row Most Recent Value  Intake Tab  Referral Department  Hospitalist  Unit at Time of Referral  Med/Surg Unit  Palliative Care Primary Diagnosis  Other (Comment)  Date Notified  05/23/16  Palliative Care Type  Return patient Palliative Care  Reason for referral  Clarify Goals of Care  Date of Admission  05/20/16  Date first seen by Palliative Care  05/24/16  # of days Palliative referral response time  1 Day(s)  # of days IP prior to Palliative referral  3  Clinical Assessment  Palliative Performance Scale Score  30%  Pain Max last 24 hours  6  Pain Min Last 24 hours  4  Psychosocial & Spiritual Assessment  Palliative Care Outcomes  Patient/Family meeting held?  Yes  Who was at the meeting?  patient   Palliative Care Outcomes  Clarified goals of care  Patient/Family wishes: Interventions discontinued/not started   Hemodialysis, PEG  Palliative Care follow-up planned  Yes, Facility      Patient Active Problem List   Diagnosis Date Noted  . Acute abdominal pain   . Pre-operative  cardiovascular clearance 05/25/2016  . Palliative care encounter   . Goals of care, counseling/discussion   . Dementia   . Acute on chronic renal insufficiency ([place]) 05/24/2016  . Malnutrition of moderate degree 05/22/2016  . Acute delirium 05/22/2016  . Inguinal hernia 05/20/2016  . Bilateral inguinal hernia without obstruction or gangrene 05/20/2016  . LLQ abdominal pain 05/20/2016  . Constipation 05/20/2016  . Anemia, chronic disease 05/20/2016  . Generalized weakness 05/20/2016  . CKD (chronic kidney disease) stage 3, GFR 30-59 ml/min 05/20/2016  . Chronic anticoagulation 05/20/2016  . Elevated serum creatinine   . SOB (shortness of breath)   . Essential hypertension   . Type 2 diabetes mellitus with complication ([place])   . COPD exacerbation ([place]) 12/31/2015  . OA (osteoarthritis) 12/02/2015  . ESR raised 07/22/2014  . Anemia 02/18/2014  . Weight loss 12/11/2013  . LV dysfunction-EF 40-45% 03/02/2012  . PAF (paroxysmal atrial fibrillation) ([place]) 02/22/2012  . Vitamin B12 deficiency 09/29/2011  . COPD with bronchitis 09/29/2011  . History of gastroesophageal reflux (GERD) 09/29/2011  . Diabetes mellitus type 2, diet-controlled ([place])   . Other B-complex deficiencies 05/24/2011  . Other general symptoms  05/17/2011  . NASH (nonalcoholic steatohepatitis) 05/17/2011  . Cirrhosis of liver not due to alcohol  05/17/2011  . Personal history of colonic polyps 05/17/2011  . CAD-known CTO RCA with L-R collaterals 03/17/2011  . Syncope and collapse   . Dizziness   . Diaphoresis   . Chest pain   . Chest tightness   . Palpitations   . COPD (chronic obstructive pulmonary disease) ([place])   . GERD (gastroesophageal  reflux disease)   . BPH (benign prostatic hypertrophy)   . HYPERLIPIDEMIA 11/19/2007  . OBESITY 11/19/2007  . HYPERTENSION 11/19/2007  . INTERNAL HEMORRHOIDS 11/19/2007  . GERD 11/19/2007  . DIVERTICULOSIS, COLON 11/19/2007  . BILIARY CIRRHOSIS, PRIMARY 11/19/2007  .  BENIGN PROSTATIC HYPERTROPHY, HX OF 11/19/2007  . COLONIC POLYPS, ADENOMATOUS 07/08/2002    Palliative Care Assessment & Plan   Patient Profile:  82 y.o. male  with past medical history of dementia, bilateral inguinal hernias, COPD, CKD, Afib, NASH, Anemia, CAD, mixed heart failure and DM2, who was admitted on 05/20/2016 with abdominal pain secondary to bilateral inguinal hernias with constipation.  He has been evaluated by surgery who feel that the hernias could be repaired. He was evaluated by Cardiology who felt that he is at an increased risk for a major adverse cardia event from surgery, but they did not advise against surgery.       Assessment:  s/p recent unilateral hernia repair Dementia Frailty Dysphagia Acute on chronic kidney disease.  Stabilized. Creatinine 1.22 (low) Aspiration pneumonia  Recommendations/Plan:  Please place written order in D/C summary for Palliative Care to follow at SNF  Will give Lasix 40 mg IV x 1 per Dr. Candiss Norse.  Will change bmet to cmet to add lfts in am.      Code Status: DNR       Prognosis:   < 12 months is possible.   Discharge Planning:  [facility] for rehab with Palliative care service follow-up  Care plan was discussed with patient, Dr Candiss Norse, case management.     Thank you for allowing the Palliative Medicine Team to assist in the care of this patient.   Time In: 3:50 Time Out: 4:05 Total Time 15 Prolonged Time Billed  no       Greater than 50%  of this time was spent counseling and coordinating care related to the above assessment and plan.  Imogene Burn, PA-C Palliative Medicine Pager: [phone]  Please contact Palliative Medicine Team phone at [phone] for questions and concerns.

## 2016-06-01 DIAGNOSIS — D638 Anemia in other chronic diseases classified elsewhere: Secondary | ICD-10-CM

## 2016-06-01 DIAGNOSIS — I5043 Acute on chronic combined systolic (congestive) and diastolic (congestive) heart failure: Secondary | ICD-10-CM

## 2016-06-01 LAB — GLUCOSE, CAPILLARY
GLUCOSE-CAPILLARY: 98 mg/dL (ref 65–99)
GLUCOSE-CAPILLARY: 191 mg/dL — AB (ref 65–99)
Glucose-Capillary: 101 mg/dL — ABNORMAL HIGH (ref 65–99)
Glucose-Capillary: 117 mg/dL — ABNORMAL HIGH (ref 65–99)
Glucose-Capillary: 128 mg/dL — ABNORMAL HIGH (ref 65–99)
Glucose-Capillary: 151 mg/dL — ABNORMAL HIGH (ref 65–99)

## 2016-06-01 LAB — CBC
HCT: 28.8 % — ABNORMAL LOW (ref 39.0–52.0)
HEMOGLOBIN: 9.8 g/dL — AB (ref 13.0–17.0)
MCH: 30.7 pg (ref 26.0–34.0)
MCHC: 34 g/dL (ref 30.0–36.0)
MCV: 90.3 fL (ref 78.0–100.0)
Platelets: 137 10*3/uL — ABNORMAL LOW (ref 150–400)
RBC: 3.19 MIL/uL — AB (ref 4.22–5.81)
RDW: 14.7 % (ref 11.5–15.5)
WBC: 9.2 10*3/uL (ref 4.0–10.5)

## 2016-06-01 LAB — COMPREHENSIVE METABOLIC PANEL
ALT: 11 U/L — AB (ref 17–63)
AST: 16 U/L (ref 15–41)
Albumin: 2.4 g/dL — ABNORMAL LOW (ref 3.5–5.0)
Alkaline Phosphatase: 70 U/L (ref 38–126)
Anion gap: 6 (ref 5–15)
BUN: 41 mg/dL — AB (ref 6–20)
CHLORIDE: 105 mmol/L (ref 101–111)
CO2: 28 mmol/L (ref 22–32)
CREATININE: 1.21 mg/dL (ref 0.61–1.24)
Calcium: 7.4 mg/dL — ABNORMAL LOW (ref 8.9–10.3)
GFR calc Af Amer: 59 mL/min — ABNORMAL LOW (ref >60)
GFR calc non Af Amer: 51 mL/min — ABNORMAL LOW (ref >60)
GLUCOSE: 102 mg/dL — AB (ref 65–99)
Potassium: 2.9 mmol/L — ABNORMAL LOW (ref 3.5–5.1)
SODIUM: 139 mmol/L (ref 135–145)
Total Bilirubin: 0.8 mg/dL (ref 0.3–1.2)
Total Protein: 5.4 g/dL — ABNORMAL LOW (ref 6.5–8.1)

## 2016-06-01 LAB — MAGNESIUM: MAGNESIUM: 1.6 mg/dL — AB (ref 1.7–2.4)

## 2016-06-01 MED ORDER — POTASSIUM CHLORIDE CRYS ER 20 MEQ PO TBCR
40.0000 meq | EXTENDED_RELEASE_TABLET | Freq: Four times a day (QID) | ORAL | Status: AC
Start: 2016-06-01 — End: 2016-06-01
  Administered 2016-06-01 (×2): 40 meq via ORAL
  Filled 2016-06-01 (×2): qty 2

## 2016-06-01 MED ORDER — MAGNESIUM SULFATE IN D5W 1-5 GM/100ML-% IV SOLN
1.0000 g | Freq: Once | INTRAVENOUS | Status: AC
Start: 2016-06-01 — End: 2016-06-01
  Administered 2016-06-01: 1 g via INTRAVENOUS
  Filled 2016-06-01: qty 100

## 2016-06-01 MED ORDER — FUROSEMIDE 10 MG/ML IJ SOLN
40.0000 mg | Freq: Once | INTRAMUSCULAR | Status: AC
  Administered 2016-06-01: 40 mg via INTRAVENOUS
  Filled 2016-06-01: qty 4

## 2016-06-01 NOTE — Progress Notes (Signed)
PROGRESS NOTE                                                                                                                                                                                                             Patient Demographics:    Cory Lawrence, is a 82 y.o. male, DOB - 03/14/1925, YQ:[id number]  Admit date - 05/20/2016   Admitting Physician Murlean Iba, MD  Outpatient Primary MD for the patient is [name], [name], MD  LOS - 8  Chief Complaint  Patient presents with  . Hernia       Brief Narrative   82 year old Caucasian male with history of advanced dementia, paroxysmal atrial fibrillation [name] 2 score of at least 6, on xaralto, COPD, BPH, bilateral inguinal hernias, CAD, chronic combined systolic and diastolic CHF EF [id number], [place], CK D stage III, DM type II, primary biliary cirrhosis who was admitted to the hospital with abdominal pain due to incarcerated hernias left more than right. He was also found to be in acute renal failure. General surgery palliative care following. Patient is DO NOT RESUSCITATE. Family is very reasonable, however in my opinion patient will have no chance of survival are good quality of life if we do not proceed with surgical correction of his incarcerated hernia. I have discussed this with general surgeon Dr. Wynonia Sours on 05/25/2016.  Patientt and wife clearly understood that he is a very high risk of adverse cardio pulmonary outcome and the accept this risk, they want to take the chance of undergoing surgery with at least some chance of pain-free and decent quality of life. They agree that if surgery does not go well patient might die or end up with residential hospice.  With that understanding we proceeded to bilateral inguinal hernia repair surgery on 05/27/2016, he tolerated the surgery except he developed aspiration pneumonia during the perioperative period, is currently on  antibiotics for pneumonia clinically getting better, still frail, likely will require SNF in a few days. If declines family is open for residential hospice. Palliative care following as well along with general surgery.    Subjective:    Cory Lawrence appears oversedated, nursing staff reporting he received IV Haldol overnight   Assessment  & Plan :     1.Bilateral incarcerated inguinal hernias left more than right. Gen. surgery consulted, after much  deliberation patient underwent bilateral inguinal hernia repair surgery by Dr. Harlow Asa on 05/27/2016, seems to have tolerated the procedure well, we'll continue to monitor with supportive care. IS in place, will need SNF.  2.  Suspect acute on chronic combined systolic and diastolic congestive heart failure -Overnight patient having increase cough and shortness of breath, having crackles on exam was given IV Lasix. -His last transthoracic echocardiogram was performed on 05/22/2016 that showed an EF of 40-45% with grade 1 diastolic dysfunction -On my evaluation this morning continues to have crackles on lung exam, will give an additional 40 mg of IV Lasix this morning, monitor urine output  2. CAD with chronic ischemic cardiomyopathy causing chronic combined systolic and diastolic heart failure EF 40%.We appreciate cardiology input Continue Imdur and statin for secondary prevention.  3. Paroxysmal atrial fibrillation with [name] 2 score overtly 6. - He is on digoxin, beta blocker and xaralto. Was seen by cardiology prior to surgery.  4. Dyslipidemia.  -On statin.  5. NASH with primary biliary cirrhosis.  -Supportive care, continue Urosodiol.  6. Acute renal failure on CK D stage III.  -Baseline creatinine close to 1.4.  -Having evidence of fluid overload, will be given an additional dose of IV Lasix today, will monitor his renal function closely  7. Advanced dementia, generalized deconditioning  -Remains at risk for developing delirium,  patient and wife wish him to be DO NOT RESUSCITATE, they do not wish any unnecessary heroics. If stable will go to SNF if declines will go to residential hospice.  8. Chronic low-dose prednisone use.  -Wife unsure why he takes it, briefly on low-dose hydrocortisone now switched to 25 mg PO prednisone. Can be placed back on his home dose upon discharge.  9. AOCD.  -Type screen was transfused 1 unit of packed RBC preop on 05/26/2016. Antineutrophil intermittently monitor H&H.  10. Urinary retention.  1Continue Cardura, Foley placed on 05/28/2016 and removed after he passed voiding trial on 05/30/2016.  11. Left-sided pneumonia, suspicious for aspiration due to recent surgery and profound weakness and poor cough reflex. Speech Therapy following on D3 diet, feeding assistance and aspiration precautions, sputum culture, supportive care with oxygen and nebulizer treatments, he did have significant cough early morning 05/29/2016 per wife. Placed on Primaxin, added flutter valve and incentive spirometer, encouraged to sit up in the chair, Mucomyst added 1 dose on 05/31/2016 to assist secretion removal respiratory therapist counseled bedside.     Lab Results  Component Value Date   HGBA1C 5.3 05/21/2016    CBG (last 3)   Recent Labs  06/01/16 0339 06/01/16 0736 06/01/16 1146  GLUCAP 98 101* 128*      Family Communication  :  Wife  Code Status :  DNR  Diet : D3 diet  Disposition Plan  :  Anticipate discharge to skilled nursing facility in the next 24 hours  Consults  :  CCS, Pall Care, Cards  Procedures  :    TTE   Normal LV wall thickness with LVEF approximately 40-45%. Mid to basal inferolateral hypokinesis. Grade 1 diastolic dysfunction with normal estimated LV filling pressure. Mild left atrial enlargement. MAC with trivial mitral regurgitation. Mild to moderate calcific aortic stenosis. Trivial tricuspid regurgitation.  Bilateral Inguinal hernia surgery bilateral by Dr. Armandina Gemma on 05/27/2016.   DVT Prophylaxis  :   Xaralto    Lab Results  Component Value Date   PLT 137 (L) 06/01/2016    Inpatient Medications  Scheduled Meds: . antiseptic oral  rinse  7 mL Mouth Rinse BID  . digoxin  0.125 mg Oral Daily  . donepezil  5 mg Oral QHS  . doxazosin  2 mg Oral QHS  . feeding supplement (ENSURE ENLIVE)  237 mL Oral TID BM  . feeding supplement (PRO-STAT SUGAR FREE 64)  30 mL Oral TID WC  . finasteride  5 mg Oral Daily  . fluticasone furoate-vilanterol  1 puff Inhalation Daily  . hydrocortisone sod succinate (SOLU-CORTEF) inj  50 mg Intravenous Daily  . imipenem-cilastatin  500 mg Intravenous Q12H  . insulin aspart  0-9 Units Subcutaneous Q4H  . isosorbide mononitrate  30 mg Oral Daily  . metoprolol tartrate  12.5 mg Oral BID  . pantoprazole  40 mg Oral Daily  . polyethylene glycol  17 g Oral Daily  . potassium chloride  40 mEq Oral Q6H  . rivaroxaban  15 mg Oral Q supper  . senna-docusate  1 tablet Oral BID  . simvastatin  20 mg Oral QHS  . ursodiol  250 mg Oral BID   Continuous Infusions:   PRN Meds:.acetaminophen **OR** [DISCONTINUED] acetaminophen, bisacodyl, diphenhydrAMINE, guaiFENesin-dextromethorphan, hydrALAZINE, HYDROcodone-acetaminophen, HYDROmorphone (DILAUDID) injection, ipratropium-albuterol, metoprolol, ondansetron (ZOFRAN) IV, [DISCONTINUED] ondansetron **OR** ondansetron (ZOFRAN) IV  Antibiotics  :    Anti-infectives    Start     Dose/Rate Route Frequency Ordered Stop   05/29/16 1200  imipenem-cilastatin (PRIMAXIN) 500 mg in sodium chloride 0.9 % 100 mL IVPB     500 mg 200 mL/hr over 30 Minutes Intravenous Every 12 hours 05/29/16 1142     05/27/16 0600  vancomycin (VANCOCIN) IVPB 1000 mg/200 mL premix     1,000 mg 200 mL/hr over 60 Minutes Intravenous On call to O.R. 05/26/16 1400 05/27/16 0932         Objective:   Vitals:   05/31/16 1341 05/31/16 2300 06/01/16 0512 06/01/16 0931  BP: (!) 137/52 (!) 150/52 (!) 155/50     Pulse: 60 65 96   Resp: 20 20 20    Temp: 98.3 F (36.8 C) 98.1 F (36.7 C) 98 F (36.7 C)   TempSrc: Oral Oral Oral   SpO2: 97% 98% 98% 95%  Weight:      Height:        Wt Readings from Last 3 Encounters:  05/20/16 54.4 kg (120 lb)  03/24/16 56.9 kg (125 lb 6.4 oz)  02/11/16 57.6 kg (127 lb)     Intake/Output Summary (Last 24 hours) at 06/01/16 1453 Last data filed at 06/01/16 0400  Gross per 24 hour  Intake             1350 ml  Output             3900 ml  Net            -2550 ml     Physical Exam  Awake but Pleasantly confused, No new F.N deficits,   [place].AT,PERRAL Supple Neck,No JVD, No cervical lymphadenopathy appriciated.  Symmetrical Chest wall movement, Good air movement bilaterally, Coarse B sounds L base RRR,No Gallops,Rubs or new Murmurs, No Parasternal Heave +ve B.Sounds, Abd Soft, No tenderness, No organomegaly appriciated, No rebound - guarding or rigidity. No Cyanosis, Clubbing or edema, No new Rash or bruise  Bilat Ing hernia surgery scar under bandage.    Data Review:    CBC  Recent Labs Lab 05/27/16 0528 05/28/16 0508 05/29/16 0503 05/30/16 0445 06/01/16 0528  WBC 8.2 10.9* 9.0 10.0 9.2  HGB 9.8* 10.3* 9.6* 9.7* 9.8*  HCT 28.3* 30.3* 29.1* 28.6* 28.8*  PLT 96* 99* 102* 123* 137*  MCV 90.1 91.3 91.5 91.4 90.3  MCH 31.2 31.0 30.2 31.0 30.7  MCHC 34.6 34.0 33.0 33.9 34.0  RDW 15.2 15.2 14.6 14.9 14.7    Chemistries   Recent Labs Lab 05/27/16 0528 05/28/16 0508 05/29/16 0503 05/30/16 0445 06/01/16 0528  NA 139 137 134* 136 139  K 3.2* 4.2 3.8 3.5 2.9*  CL 109 107 104 107 105  CO2 24 25 25 24 28   GLUCOSE 95 109* 110* 87 102*  BUN 42* 41* 42* 38* 41*  CREATININE 1.55* 1.67* 1.52* 1.22 1.21  CALCIUM 7.5* 7.5* 7.4* 7.3* 7.4*  MG 1.7 1.9 2.2 1.8 1.6*  AST  --   --   --   --  16  ALT  --   --   --   --  11*  ALKPHOS  --   --   --   --  70  BILITOT  --   --   --   --  0.8    ------------------------------------------------------------------------------------------------------------------ No results for input(s): CHOL, HDL, LDLCALC, TRIG, CHOLHDL, LDLDIRECT in the last 72 hours.  Lab Results  Component Value Date   HGBA1C 5.3 05/21/2016   ------------------------------------------------------------------------------------------------------------------ No results for input(s): TSH, T4TOTAL, T3FREE, THYROIDAB in the last 72 hours.  Invalid input(s): FREET3 ------------------------------------------------------------------------------------------------------------------ No results for input(s): VITAMINB12, FOLATE, FERRITIN, TIBC, IRON, RETICCTPCT in the last 72 hours.  Coagulation profile No results for input(s): INR, PROTIME in the last 168 hours.  No results for input(s): DDIMER in the last 72 hours.  Cardiac Enzymes No results for input(s): CKMB, TROPONINI, MYOGLOBIN in the last 168 hours.  Invalid input(s): CK ------------------------------------------------------------------------------------------------------------------    Component Value Date/Time   BNP 116.5 (H) 12/31/2015 1720    Micro Results Recent Results (from the past 240 hour(s))  Surgical pcr screen     Status: None   Collection Time: 05/26/16  5:44 PM  Result Value Ref Range Status   MRSA, PCR NEGATIVE NEGATIVE Final   Staphylococcus aureus NEGATIVE NEGATIVE Final    Comment:        The Xpert SA Assay (FDA approved for NASAL specimens in patients over 52 years of age), is one component of a comprehensive surveillance program.  Test performance has been validated by [facility] for patients greater than or equal to 40 year old. It is not intended to diagnose infection nor to guide or monitor treatment.     Radiology Reports Dg Chest 2 View  Result Date: 05/05/2016 CLINICAL DATA:  Chest pain, left flank pain, fall 5 days ago EXAM: CHEST  2 VIEW COMPARISON:  12/31/2015  FINDINGS: Cardiomediastinal silhouette is stable. Hyperinflation again noted. Stable chronic interstitial prominence. There is left basilar streaky atelectasis or early infiltrate. No pulmonary edema. Osteopenia and degenerative changes thoracic spine. Atherosclerotic calcifications of thoracic aorta. No pneumothorax. IMPRESSION: Hyperinflation. Chronic interstitial prominence. No pulmonary edema. Streaky left basilar atelectasis or early infiltrate. No pneumothorax. Electronically Signed   By: Lahoma Crocker M.D.   On: 05/05/2016 13:47   Dg Ribs Unilateral W/chest Left  Result Date: 05/05/2016 CLINICAL DATA:  Left-sided chest pain, fell 5 days ago EXAM: LEFT RIBS AND CHEST - 3+ VIEW COMPARISON:  Chest x-ray of 12/31/2015 FINDINGS: On the images obtained, no acute left rib fracture is seen. The current images are very obliqued and standard rib detail views would be helpful. The lungs appear to be hyperaerated consistent with an element of  emphysema. IMPRESSION: 1. No acute left rib fracture seen on the images obtained. Please see above. 2. Emphysema. Electronically Signed   By: Ivar Drape M.D.   On: 05/05/2016 13:47   Dg Chest Port 1 View  Result Date: 05/31/2016 CLINICAL DATA:  Congestive cough with shortness of breath EXAM: PORTABLE CHEST 1 VIEW COMPARISON:  05/29/2016 FINDINGS: Cardiac shadow is stable. Persistent left basilar infiltrate with associated effusion is noted. Blunting of the right costophrenic angle is noted. There is been some clearing in the left mid lung when compared with the prior exam. IMPRESSION: Persistent changes in the left base although interval clearing in the left mid lung is noted. Electronically Signed   By: Inez Catalina M.D.   On: 05/31/2016 09:27   Dg Chest Port 1 View  Result Date: 05/29/2016 CLINICAL DATA:  Cough, shortness of breath, weakness EXAM: PORTABLE CHEST 1 VIEW COMPARISON:  05/28/2016 FINDINGS: Patchy opacities within the left upper lobe and left base concerning  for pneumonia. Hyperinflation compatible with COPD. Heart is borderline in size. Suspect small effusions. IMPRESSION: Continued left lower lobe opacity. New left upper lobe airspace disease. Findings concerning for pneumonia. Small effusions. COPD. Electronically Signed   By: Rolm Baptise M.D.   On: 05/29/2016 10:56  Dg Chest Port 1 View  Result Date: 05/28/2016 CLINICAL DATA:  Acute shortness of breath and hypoxia today. EXAM: PORTABLE CHEST 1 VIEW COMPARISON:  05/20/2016 and prior radiographs FINDINGS: Cardiomegaly again noted. Left lower lung atelectasis versus airspace disease noted. Trace bilateral pleural effusions are present. There is no evidence of pneumothorax or pulmonary edema. No acute bony abnormalities are identified. IMPRESSION: Left lower lung atelectasis versus airspace disease. Pneumonia not excluded. Trace bilateral pleural effusions. Electronically Signed   By: Margarette Canada M.D.   On: 05/28/2016 10:56  Dg Abd Acute W/chest  Result Date: 05/20/2016 CLINICAL DATA:  Left lower abdominal pain for 4-5 days EXAM: DG ABDOMEN ACUTE W/ 1V CHEST COMPARISON:  05/16/2016 FINDINGS: There is no evidence of dilated bowel loops or free intraperitoneal air. No radiopaque calculi or other significant radiographic abnormality is seen. Heart size and mediastinal contours are within normal limits. The lungs are hyperinflated likely secondary to COPD. IMPRESSION: Negative abdominal radiographs.  No acute cardiopulmonary disease. Electronically Signed   By: Kathreen Devoid   On: 05/20/2016 15:37   Dg Abd Acute W/chest  Result Date: 05/16/2016 CLINICAL DATA:  82 year old male with history of bilateral inguinal hernias and discomfort for the past 3 days EXAM: DG ABDOMEN ACUTE W/ 1V CHEST COMPARISON:  Chest x-ray 05/05/2016. FINDINGS: Lung volumes appear slightly increased and there are mild emphysematous changes noted. Architectural distortion at the base of the left lung, similar to prior studies, favored to  reflect areas of chronic scarring. Small bilateral pleural effusions. No evidence of pulmonary edema. No suspicious appearing pulmonary nodules or masses. Heart size is normal. Upper mediastinal contours are within normal limits. Aortic atherosclerosis. Gas and stool are noted throughout the colon extending to the level of the distal rectum. No pathologic dilatation of small bowel is noted. Several nondilated gas-filled loops of small bowel are noted, predominantly in the left side of the abdomen. No pneumoperitoneum. IMPRESSION: 1. Nonobstructive bowel gas pattern. 2. No pneumoperitoneum. 3. Small bilateral pleural effusions. 4. Probable scarring the left lung base. 5. Aortic atherosclerosis. Electronically Signed   By: Vinnie Langton M.D.   On: 05/16/2016 15:32    Time Spent in minutes  35   Kelvin Cellar M.D on 06/01/2016  at 2:53 PM  Between 7am to 7pm - Pager - [phone]  After 7pm go to [email or web address] - password [facility]  [facility] -  Office  [phone]

## 2016-06-01 NOTE — Progress Notes (Signed)
Pharmacy Antibiotic Note  Cory Lawrence is a 82 y.o. male admitted on 05/20/2016 who underwent bilateral inguinal hernia repair surgery on 05/27/16. CXR today suggestive of pneumonia. Per MD, suspicious of aspiration due to recent surgery and profound weakness and poor cough reflex. Patient did have significant cough early 05/29/16 AM. Pharmacy has been consulted for Primaxin dosing.  Plan: Continue Primaxin 500mg  IV q12h, baseline Scr 1.4 Monitor renal function, cultures, clinical course.  Height: 5\' 5"  (165.1 cm) Weight: 120 lb (54.4 kg) IBW/kg (Calculated) : 61.5  Temp (24hrs), Avg:98.1 F (36.7 C), Min:98 F (36.7 C), Max:98.3 F (36.8 C)   Recent Labs Lab 05/27/16 0528 05/28/16 0508 05/29/16 0503 05/30/16 0445 06/01/16 0528  WBC 8.2 10.9* 9.0 10.0 9.2  CREATININE 1.55* 1.67* 1.52* 1.22 1.21    Estimated Creatinine Clearance: 31.8 mL/min (by C-G formula based on SCr of 1.21 mg/dL).     Antimicrobials this admission: 7/28 >> Vancomycin x 1 pre-op 7/30 >> Primaxin >>  Dose adjustments this admission: --  Microbiology results: 7/30 Sputum: sent  7/27 MRSA PCR: negative  Thank you for allowing pharmacy to be a part of this patient's care.   Cleburne Musto RPh 06/01/2016, 12:07 PM Pager [phone]

## 2016-06-01 NOTE — Progress Notes (Signed)
SLP Cancellation Note  Patient Details Name: Olof Fadely MRN: [id number] DOB: Jul 28, 1925   Cancelled treatment:       Reason Eval/Treat Not Completed:  (RN reports pt currently asleep, continues with congested breathing but tolerating po intake of medicine and portion of breakfast RN observed pt consume today (with one cough) will follow up )  Luanna Salk, [place] [facility] SLP [phone]

## 2016-06-02 DIAGNOSIS — K4001 Bilateral inguinal hernia, with obstruction, without gangrene, recurrent: Secondary | ICD-10-CM

## 2016-06-02 LAB — COMPREHENSIVE METABOLIC PANEL
ALT: 12 U/L — AB (ref 17–63)
AST: 16 U/L (ref 15–41)
Albumin: 2.2 g/dL — ABNORMAL LOW (ref 3.5–5.0)
Alkaline Phosphatase: 71 U/L (ref 38–126)
Anion gap: 6 (ref 5–15)
BUN: 51 mg/dL — ABNORMAL HIGH (ref 6–20)
CALCIUM: 7.3 mg/dL — AB (ref 8.9–10.3)
CHLORIDE: 102 mmol/L (ref 101–111)
CO2: 30 mmol/L (ref 22–32)
CREATININE: 1.03 mg/dL (ref 0.61–1.24)
Glucose, Bld: 100 mg/dL — ABNORMAL HIGH (ref 65–99)
Potassium: 3.4 mmol/L — ABNORMAL LOW (ref 3.5–5.1)
Sodium: 138 mmol/L (ref 135–145)
Total Bilirubin: 0.3 mg/dL (ref 0.3–1.2)
Total Protein: 5.5 g/dL — ABNORMAL LOW (ref 6.5–8.1)

## 2016-06-02 LAB — GLUCOSE, CAPILLARY
GLUCOSE-CAPILLARY: 103 mg/dL — AB (ref 65–99)
GLUCOSE-CAPILLARY: 169 mg/dL — AB (ref 65–99)
GLUCOSE-CAPILLARY: 83 mg/dL (ref 65–99)
GLUCOSE-CAPILLARY: 91 mg/dL (ref 65–99)

## 2016-06-02 LAB — CBC
HCT: 28 % — ABNORMAL LOW (ref 39.0–52.0)
Hemoglobin: 9.3 g/dL — ABNORMAL LOW (ref 13.0–17.0)
MCH: 30.1 pg (ref 26.0–34.0)
MCHC: 33.2 g/dL (ref 30.0–36.0)
MCV: 90.6 fL (ref 78.0–100.0)
PLATELETS: 144 10*3/uL — AB (ref 150–400)
RBC: 3.09 MIL/uL — AB (ref 4.22–5.81)
RDW: 14.6 % (ref 11.5–15.5)
WBC: 7.7 10*3/uL (ref 4.0–10.5)

## 2016-06-02 MED ORDER — ENSURE ENLIVE PO LIQD: 237.0000 mL | Freq: Three times a day (TID) | ORAL | 12 refills | Status: AC

## 2016-06-02 MED ORDER — POTASSIUM CHLORIDE CRYS ER 20 MEQ PO TBCR
40.0000 meq | EXTENDED_RELEASE_TABLET | Freq: Once | ORAL | Status: AC
  Administered 2016-06-02: 40 meq via ORAL
  Filled 2016-06-02: qty 2

## 2016-06-02 MED ORDER — HYDROCODONE-ACETAMINOPHEN 5-325 MG PO TABS: 1.0000 | ORAL_TABLET | Freq: Four times a day (QID) | ORAL | 0 refills | Status: AC | PRN

## 2016-06-02 MED ORDER — METOPROLOL TARTRATE 25 MG PO TABS: 12.5000 mg | ORAL_TABLET | Freq: Two times a day (BID) | ORAL | 1 refills | Status: AC

## 2016-06-02 MED ORDER — SENNOSIDES-DOCUSATE SODIUM 8.6-50 MG PO TABS: 1.0000 | ORAL_TABLET | Freq: Every day | ORAL | Status: DC

## 2016-06-02 NOTE — Progress Notes (Signed)
Speech Language Pathology Treatment: Dysphagia  Patient Details Name: Cory Lawrence MRN: [id number] DOB: [birth year] Today's Date: 06/02/2016 Time: 1105-1150 SLP Time Calculation (min) (ACUTE ONLY): 45 min  Assessment / Plan / Recommendation Clinical Impression  Pt with significant improvement today, he is fully alert and willing to consume po intake.  SLP observed pt consuming ice cream, graham crackers, water and Ensure.  He is impulsive and takes large sequential swallows of thin with post-swallow cough.    With tactile cues to for small single boluses, much improved tolerance.  Pt did great self feeding and tolerating solids and icecream.  Wife arrived during session and SLP educated her to aspiration mitigation strategies including adaptive equipment for bolus control of liquids.  Printed information on heimlich maneuver and swallow precautions provided.  Wife reports pt eats at rapid rate consistently and needs cues to slow down.   Pt making great progress and recommend continue dys3/thin - with assistance.  Please order follow up at SNF for dysphagia management, strengthening.     HPI HPI: pt is a 82 yo male adm to [facility] with left lower quadrant pain  and poor intake.  Pt found to have possible left lobe pna - vs ATX.  BSE performed without s/s aspiration, slow mastication and Dys 3, thin liquids recommended. Follow up ST with goals met and discharged from [place]. He underwent hernia repair since discharge and repeat CXR revealed continued left lower lobe opacity. New left upper lobe airspace disease and repeat BSE ordered.        SLP Plan   Follow up at SNF for dysphagia management     Recommendations  Diet recommendations: Dysphagia 3 (mechanical soft);Thin liquid Liquids provided via: Straw Medication Administration: Whole meds with puree Compensations: Slow rate;Small sips/bites;Lingual sweep for clearance of pocketing;Follow solids with liquid (rest breaks as needed) Postural  Changes and/or Swallow Maneuvers: Seated upright 90 degrees;Upright 30-60 min after meal             Oral Care Recommendations: Oral care BID Follow up Recommendations: Skilled Nursing facility (briefly for education, tolerance, strengthening)     [place], [place] [facility] SLP [phone]

## 2016-06-02 NOTE — Progress Notes (Addendum)
Daily Progress Note   Patient Name: Cory Lawrence       Date: 06/02/2016 DOB: 02-16-1925  Age: 82 y.o. MRN#: [id number] Attending Physician: Kelvin Cellar, MD Primary Care Physician: [name], MD Admit Date: 05/20/2016  Reason for Consultation/Follow-up: Non pain symptom management and Psychosocial/spiritual support  Subjective: Complaining of being weak.  Worried he wont be able to walk.  Wife indicates to me that she does not think he will not do well.    Patient complains of too many loose stools.   Length of Stay: 9  Current Medications: Scheduled Meds:  . antiseptic oral rinse  7 mL Mouth Rinse BID  . digoxin  0.125 mg Oral Daily  . donepezil  5 mg Oral QHS  . doxazosin  2 mg Oral QHS  . feeding supplement (ENSURE ENLIVE)  237 mL Oral TID BM  . feeding supplement (PRO-STAT SUGAR FREE 64)  30 mL Oral TID WC  . finasteride  5 mg Oral Daily  . fluticasone furoate-vilanterol  1 puff Inhalation Daily  . hydrocortisone sod succinate (SOLU-CORTEF) inj  50 mg Intravenous Daily  . imipenem-cilastatin  500 mg Intravenous Q12H  . insulin aspart  0-9 Units Subcutaneous Q4H  . isosorbide mononitrate  30 mg Oral Daily  . metoprolol tartrate  12.5 mg Oral BID  . pantoprazole  40 mg Oral Daily  . polyethylene glycol  17 g Oral Daily  . potassium chloride  40 mEq Oral Once  . rivaroxaban  15 mg Oral Q supper  . senna-docusate  1 tablet Oral BID  . simvastatin  20 mg Oral QHS  . ursodiol  250 mg Oral BID    Continuous Infusions:    PRN Meds: acetaminophen **OR** [DISCONTINUED] acetaminophen, bisacodyl, guaiFENesin-dextromethorphan, hydrALAZINE, HYDROcodone-acetaminophen, HYDROmorphone (DILAUDID) injection, ipratropium-albuterol, metoprolol, ondansetron (ZOFRAN) IV,  [DISCONTINUED] ondansetron **OR** ondansetron (ZOFRAN) IV  Physical Exam         Elderly gentleman, frail,  Contrary. NAD S1 S2, faint Lungs much more clear No edema Awake alert  Vital Signs: BP (!) 154/51 (BP Location: Left Arm)   Pulse (!) 54   Temp 97.9 F (36.6 C) (Oral)   Resp 20   Ht '5\' 5"'$  (1.651 m)   Wt 54.4 kg (120 lb)   SpO2 97%   BMI 19.97 kg/m  SpO2:  SpO2: 97 % O2 Device: O2 Device: Nasal Cannula O2 Flow Rate: O2 Flow Rate (L/min): 2 L/min  Intake/output summary:   Intake/Output Summary (Last 24 hours) at 06/02/16 7782 Last data filed at 06/02/16 0600  Gross per 24 hour  Intake              440 ml  Output             2900 ml  Net            -2460 ml   LBM: Last BM Date: 06/01/16 Baseline Weight: Weight: 54.4 kg (120 lb) Most recent weight: Weight: 54.4 kg (120 lb)       Palliative Assessment/Data:    Flowsheet Rows   Flowsheet Row Most Recent Value  Intake Tab  Referral Department  Hospitalist  Unit at Time of Referral  Med/Surg Unit  Palliative Care Primary Diagnosis  Other (Comment)  Date Notified  05/23/16  Palliative Care Type  Return patient Palliative Care  Reason for referral  Clarify Goals of Care  Date of Admission  05/20/16  Date first seen by Palliative Care  05/24/16  # of days Palliative referral response time  1 Day(s)  # of days IP prior to Palliative referral  3  Clinical Assessment  Palliative Performance Scale Score  30%  Pain Max last 24 hours  6  Pain Min Last 24 hours  4  Psychosocial & Spiritual Assessment  Palliative Care Outcomes  Patient/Family meeting held?  Yes  Who was at the meeting?  patient   Palliative Care Outcomes  Clarified goals of care  Patient/Family wishes: Interventions discontinued/not started   Hemodialysis, PEG  Palliative Care follow-up planned  Yes, Facility      Patient Active Problem List   Diagnosis Date Noted  . Acute abdominal pain   . Pre-operative cardiovascular clearance 05/25/2016    . Palliative care encounter   . Goals of care, counseling/discussion   . Dementia   . Acute on chronic renal insufficiency ([place]) 05/24/2016  . Malnutrition of moderate degree 05/22/2016  . Acute delirium 05/22/2016  . Inguinal hernia 05/20/2016  . Bilateral inguinal hernia without obstruction or gangrene 05/20/2016  . LLQ abdominal pain 05/20/2016  . Constipation 05/20/2016  . Anemia, chronic disease 05/20/2016  . Generalized weakness 05/20/2016  . CKD (chronic kidney disease) stage 3, GFR 30-59 ml/min 05/20/2016  . Chronic anticoagulation 05/20/2016  . Elevated serum creatinine   . SOB (shortness of breath)   . Essential hypertension   . Type 2 diabetes mellitus with complication ([place])   . COPD exacerbation ([place]) 12/31/2015  . OA (osteoarthritis) 12/02/2015  . ESR raised 07/22/2014  . Anemia 02/18/2014  . Weight loss 12/11/2013  . LV dysfunction-EF 40-45% 03/02/2012  . PAF (paroxysmal atrial fibrillation) ([place]) 02/22/2012  . Vitamin B12 deficiency 09/29/2011  . COPD with bronchitis 09/29/2011  . History of gastroesophageal reflux (GERD) 09/29/2011  . Diabetes mellitus type 2, diet-controlled ([place])   . Other B-complex deficiencies 05/24/2011  . Other general symptoms  05/17/2011  . NASH (nonalcoholic steatohepatitis) 05/17/2011  . Cirrhosis of liver not due to alcohol  05/17/2011  . Personal history of colonic polyps 05/17/2011  . CAD-known CTO RCA with L-R collaterals 03/17/2011  . Syncope and collapse   . Dizziness   . Diaphoresis   . Chest pain   . Chest tightness   . Palpitations   . COPD (chronic obstructive pulmonary disease) ([place])   . GERD (gastroesophageal reflux disease)   .  BPH (benign prostatic hypertrophy)   . HYPERLIPIDEMIA 11/19/2007  . OBESITY 11/19/2007  . HYPERTENSION 11/19/2007  . INTERNAL HEMORRHOIDS 11/19/2007  . GERD 11/19/2007  . DIVERTICULOSIS, COLON 11/19/2007  . BILIARY CIRRHOSIS, PRIMARY 11/19/2007  . BENIGN PROSTATIC HYPERTROPHY, HX OF  11/19/2007  . COLONIC POLYPS, ADENOMATOUS 07/08/2002    Palliative Care Assessment & Plan   Patient Profile:  82 y.o. male  with past medical history of dementia, bilateral inguinal hernias, COPD, CKD, Afib, NASH, Anemia, CAD, mixed heart failure and DM2, who was admitted on 05/20/2016 with abdominal pain secondary to bilateral inguinal hernias with constipation.  He has been evaluated by surgery who feel that the hernias could be repaired. He was evaluated by Cardiology who felt that he is at an increased risk for a major adverse cardia event from surgery, but they did not advise against surgery.       Assessment:  s/p recent unilateral hernia repair Dementia Frailty Dysphagia Acute on chronic kidney disease.  Stabilized. Creatinine 1.22 (low) Aspiration pneumonia  Recommendations/Plan:  Please place written order in D/C summary for Palliative Care to follow at SNF  Ordered abdominal binder for comfort when Mr. Barreiro attempts to stand.  Will decrease senna/s to once a day.   Code Status: DNR  Prognosis:   < 6 months.  Discharge Planning:  [facility] for rehab with Palliative care service follow-up  Discussed care plan with wife.  Thank you for allowing the Palliative Medicine Team to assist in the care of this patient.   Time In: 9:00 Time Out: 9:15 Total Time 15 Prolonged Time Billed  no       Greater than 50%  of this time was spent counseling and coordinating care related to the above assessment and plan.  Imogene Burn, PA-C Palliative Medicine Pager: [phone]  Please contact Palliative Medicine Team phone at [phone] for questions and concerns.

## 2016-06-02 NOTE — Discharge Summary (Addendum)
Physician Discharge Summary  Cory Lawrence [id number] DOB: 1925/01/12 DOA: 05/20/2016  PCP: [name], MD  Admit date: 05/20/2016 Discharge date: 06/02/2016  Time spent: 35 minutes  Recommendations for Outpatient Follow-up:  1. During this hospitalization he underwent bilateral inguinal hernia repair for incarcerated inguinal hernia, procedure performed on 05/19/2016 by Dr. Harlow Asa, please set up outpatient follow-up with general surgery in 1-2 weeks 2. During this hospitalization is treated for volume overload with IV Lasix, please follow-up of volume status 3. He is on anticoagulation for paroxysmal atrial fibrillation 4. Cory Lawrence was discharged to skilled nursing facility on 06/02/2016   Discharge Diagnoses:  Principal Problem:   Bilateral inguinal hernia without obstruction or gangrene Active Problems:   COPD (chronic obstructive pulmonary disease) (HCC)   CAD-known CTO RCA with L-R collaterals   NASH (nonalcoholic steatohepatitis)   Diabetes mellitus type 2, diet-controlled (HCC)   COPD with bronchitis   History of gastroesophageal reflux (GERD)   PAF (paroxysmal atrial fibrillation) (HCC)   Weight loss   Essential hypertension   Inguinal hernia   LLQ abdominal pain   Constipation   Anemia, chronic disease   Generalized weakness   CKD (chronic kidney disease) stage 3, GFR 30-59 ml/min   Chronic anticoagulation   Malnutrition of moderate degree   Acute delirium   Acute on chronic renal insufficiency (HCC)   Palliative care encounter   Goals of care, counseling/discussion   Pre-operative cardiovascular clearance   Dementia   Acute abdominal pain   Discharge Condition: Improved  Diet recommendation: Dysphagia 3 diet (Mechanical soft)  Filed Weights   05/20/16 1402  Weight: 54.4 kg (120 lb)    History of present illness:  Cory Lawrence is a 82 y.o. male with a complex PMH detailed below presented to the ED for the second time in the last  several days with severe recurrent LLQ abdominal pain related to bilateral inguinal hernias complicated by chronic constipation and generalized weakness from poor oral intake.  The patient and his wife report that 5-6 days ago the patient picked up something at home and afterwards had severe pain mostly on the left side lower quadrant of abdomen. He reports that his pain is in his inguinal region and that he noted that his hernia had protruded and felt hard like a baseball. He was seen 4 days ago in the emergency department for this issue and per notes had his hernia successfully reduced and was sent  Home with outpatient surgical follow-up. The patient and his wife report that it came right back out and the patient has been in severe pain over the last few days at home. He has had very little appetite and has not been able to eat much. He has only had 1 bowel movement over this time. His wife said she had to give him multiple laxatives in order for him to have a bowel movement. He says the pain can be extremely severe and at this morning it was extremely severe.   Many years ago he was diagnosed with the hernias and was offered a repair but his symptoms improved on their own and so he refused it.  Hospital Course:  82 year old Caucasian male with history of advanced dementia, paroxysmal atrial fibrillation [name] 2 score of at least 6, on xaralto, COPD, BPH, bilateral inguinal hernias, CAD, chronic combined systolic and diastolic CHF EF [id number], Nash, CK D stage III, DM type II, primary biliary cirrhosis who was admitted to the hospital with abdominal  pain due to incarcerated hernias left more than right. He was also found to be in acute renal failure. General surgery palliative care following. The risks and benefits of surgical intervention were explained to Cory Lawrence and his wife as they elected to proceed with bilateral inguinal hernia repair surgery. On 05/27/2016 he was taken to the operative room  undergoing inguinal hernia repair, he tolerated the surgery except he developed aspiration pneumonia during the perioperative period. He was treated with 5 days of broad-spectrum IV antibiotic therapy. He showed gradual clinical improvement. Physical therapy consulted who recommended skilled nursing facility placement. He was discharged to skilled nursing facility on 06/02/2016.   1.Bilateral incarcerated inguinal hernias left more than right. Gen. surgery consulted, undergoing bilateral inguinal hernia repair surgery by Dr. Harlow Asa on 05/27/2016  2.  Suspect acute on chronic combined systolic and diastolic congestive heart failure -He had clinical evidence of CHF and was given 2 doses of IV Lasix with subsequent improvement. During this hospitalization he had received IV fluids. -His last transthoracic echocardiogram was performed on 05/22/2016 that showed an EF of 40-45% with grade 1 diastolic dysfunction -By [id number] he show significant clinical improvement, please follow-up of on status.  2. CAD with chronic ischemic cardiomyopathy causing chronic combined systolic and diastolic heart failure EF 40%.We appreciate cardiology input Continue Imdur and statin for secondary prevention.  3. Paroxysmal atrial fibrillation with [name] 2 score overtly 6. - He is on digoxin, beta blocker and xaralto. Was seen by cardiology prior to surgery.  4. Dyslipidemia.  -On statin.  5. NASH with primary biliary cirrhosis.  -Supportive care, continue Urosodiol.  6. Acute renal failure on CK D stage III.  -Baseline creatinine close to 1.4.  -Having evidence of fluid overload, will be given an additional dose of IV Lasix today, will monitor his renal function closely  7. Advanced dementia, generalized deconditioning  -Remains at risk for developing delirium, patient and wife wish him to be DO NOT RESUSCITATE, they do not wish any unnecessary heroics. If stable will go to SNF if declines will go to  residential hospice.  8. Chronic low-dose prednisone use.  -He was discharged on his home dose of prednisone  9. AOCD.  -Type screen was transfused 1 unit of packed RBC preop on 05/26/2016. Antineutrophil intermittently monitor H&H.  10. Urinary retention.  He was continued on Proscar and Cardura -He had issues with urinary retention during this hospitalization, thus discharged on indwelling Foley catheter. -Recommend voiding trial in outpatient setting and urology follow-up  11. Left-sided pneumonia, suspicious for aspiration due to recent surgery and profound weakness and poor cough reflex. Speech Therapy following on D3 diet, feeding assistance and aspiration precautions, sputum culture, supportive care with oxygen and nebulizer treatments, he did have significant cough early morning 05/29/2016 per wife. He received the 5 days of IV antibiotic therapy. By 06/02/2016 he was doing much better, IV antibiotic therapy was discontinued on this date.   Procedures: -TTE   Normal LV wall thickness with LVEF approximately 40-45%. Mid tobasal inferolateral hypokinesis. Grade 1 diastolic dysfunctionwith normal estimated LV filling pressure. Mild left atrialenlargement. MAC with trivial mitral regurgitation. Mild tomoderate calcific aortic stenosis. Trivial tricuspidregurgitation.  -Bilateral Inguinal hernia surgery by Dr. Armandina Gemma on 05/27/2016.  Consultations:  General surgery  Discharge Exam: Vitals:   06/01/16 2021 06/02/16 0426  BP: (!) 126/50 (!) 154/51  Pulse: 76 (!) 54  Resp:  20  Temp:  97.9 F (36.6 C)    He looks much  better today, awake, alert, interactive [place].AT,PERRAL Supple Neck,No JVD, No cervical lymphadenopathy appriciated.  Symmetrical Chest wall movement, Good air movement bilaterally, Coarse B sounds L base RRR,No Gallops,Rubs or new Murmurs, No Parasternal Heave +ve B.Sounds, Abd Soft, No tenderness, No organomegaly appriciated, No rebound - guarding or  rigidity. No Cyanosis, Clubbing or edema, No new Rash or bruise  Bilat Ing hernia surgery scar under bandage.  Discharge Instructions   Discharge Instructions    Call MD for:    Complete by:  As directed   Call MD for:  difficulty breathing, headache or visual disturbances    Complete by:  As directed   Call MD for:  extreme fatigue    Complete by:  As directed   Call MD for:  hives    Complete by:  As directed   Call MD for:  persistant dizziness or light-headedness    Complete by:  As directed   Call MD for:  persistant nausea and vomiting    Complete by:  As directed   Call MD for:  redness, tenderness, or signs of infection (pain, swelling, redness, odor or green/yellow discharge around incision site)    Complete by:  As directed   Call MD for:  severe uncontrolled pain    Complete by:  As directed   Call MD for:  temperature >100.4    Complete by:  As directed   Diet - low sodium heart healthy    Complete by:  As directed   Increase activity slowly    Complete by:  As directed     Current Discharge Medication List    START taking these medications   Details  feeding supplement, ENSURE ENLIVE, (ENSURE ENLIVE) LIQD Take 237 mLs by mouth 3 (three) times daily between meals. Qty: 237 mL, Refills: 12    HYDROcodone-acetaminophen (NORCO/VICODIN) 5-325 MG tablet Take 1 tablet by mouth every 6 (six) hours as needed for moderate pain. Qty: 10 tablet, Refills: 0    metoprolol tartrate (LOPRESSOR) 25 MG tablet Take 0.5 tablets (12.5 mg total) by mouth 2 (two) times daily. Qty: 60 tablet, Refills: 1      CONTINUE these medications which have NOT CHANGED   Details  cyanocobalamin (,VITAMIN B-12,) 1000 MCG/ML injection INJECT 1 ML (1,000 MCG TOTAL) INTO THE MUSCLE EVERY 30 (THIRTY) DAYS. Qty: 10 mL, Refills: 6    diclofenac sodium (VOLTAREN) 1 % GEL APPLY 2 GRAMS TOPICALLY 3 TIMES DAILY Qty: 100 g, Refills: 5    digoxin (LANOXIN) 0.125 MG tablet TAKE 1 TABLET BY MOUTH DAILY Qty: 90  tablet, Refills: 0    donepezil (ARICEPT) 5 MG tablet Take 1 tablet (5 mg total) by mouth at bedtime. Qty: 30 tablet, Refills: 11    doxazosin (CARDURA) 4 MG tablet Take 1 tablet (4 mg total) by mouth at bedtime. Qty: 90 tablet, Refills: 1    Fe-Succ Ac-C-Thre Ac-B12-FA (FERREX 150 FORTE PLUS) 50-100 MG CAPS Take 1 capsule by mouth daily. Qty: 90 each, Refills: 3    finasteride (PROSCAR) 5 MG tablet Take 1 tablet (5 mg total) by mouth daily. Qty: 90 tablet, Refills: 3    Fluticasone-Salmeterol (ADVAIR DISKUS) 250-50 MCG/DOSE AEPB Inhale 1 puff into the lungs as needed. Shortness of breath. Qty: 60 each, Refills: 11    ipratropium-albuterol (DUONEB) 0.5-2.5 (3) MG/3ML SOLN Take 3 mLs by nebulization every 6 (six) hours as needed. Qty: 360 mL, Refills: 2    isosorbide mononitrate (IMDUR) 30 MG 24 hr tablet TAKE 1  TABLET (30 MG TOTAL) BY MOUTH DAILY. Qty: 90 tablet, Refills: 3    predniSONE (DELTASONE) 1 MG tablet TAKE 4 TABLETS DAILY FOR DOSAGE OF 4 MG A DAY Qty: 360 tablet, Refills: 1    simvastatin (ZOCOR) 40 MG tablet TAKE 1 TABLET BY MOUTH EVERY DAY AT BEDTIME Qty: 90 tablet, Refills: 1    XARELTO 15 MG TABS tablet TAKE 1 TABLET BY MOUTH EVERY DAY Qty: 90 tablet, Refills: 3    Ipratropium-Albuterol (COMBIVENT) 20-100 MCG/ACT AERS respimat Inhale 1 puff into the lungs every 6 (six) hours as needed for wheezing. Qty: 1 Inhaler, Refills: 11    pantoprazole (PROTONIX) 40 MG tablet TAKE 1 TABLET (40 MG TOTAL) BY MOUTH DAILY. Qty: 90 tablet, Refills: 3    ursodiol (ACTIGALL) 250 MG tablet TAKE 1 TABLET BY MOUTH TWICE A DAY Qty: 180 tablet, Refills: 1      STOP taking these medications     ALPRAZolam (XANAX) 0.5 MG tablet      Melatonin 3 MG CAPS      triamcinolone ointment (KENALOG) 0.1 %      doxycycline (VIBRA-TABS) 100 MG tablet      tamsulosin (FLOMAX) 0.4 MG CAPS capsule        Allergies  Allergen Reactions  . Ciprofloxacin Hcl Swelling  . Penicillins Hives     Has patient had a PCN reaction causing immediate rash, facial/tongue/throat swelling, SOB or lightheadedness with hypotension: YES Has patient had a PCN reaction causing severe rash involving mucus membranes or skin necrosis: NO Has patient had a PCN reaction that required hospitalization NO Has patient had a PCN reaction occurring within the last 10 years: NO If all of the above answers are "NO", then may proceed with Cephalosporin use.   Contact information for after-discharge care    Destination    [facility] .   Specialty:  [place] information: [place] [phone]               The results of significant diagnostics from this hospitalization (including imaging, microbiology, ancillary and laboratory) are listed below for reference.    Significant Diagnostic Studies: Dg Chest 2 View  Result Date: 05/05/2016 CLINICAL DATA:  Chest pain, left flank pain, fall 5 days ago EXAM: CHEST  2 VIEW COMPARISON:  12/31/2015 FINDINGS: Cardiomediastinal silhouette is stable. Hyperinflation again noted. Stable chronic interstitial prominence. There is left basilar streaky atelectasis or early infiltrate. No pulmonary edema. Osteopenia and degenerative changes thoracic spine. Atherosclerotic calcifications of thoracic aorta. No pneumothorax. IMPRESSION: Hyperinflation. Chronic interstitial prominence. No pulmonary edema. Streaky left basilar atelectasis or early infiltrate. No pneumothorax. Electronically Signed   By: Lahoma Crocker M.D.   On: 05/05/2016 13:47   Dg Ribs Unilateral W/chest Left  Result Date: 05/05/2016 CLINICAL DATA:  Left-sided chest pain, fell 5 days ago EXAM: LEFT RIBS AND CHEST - 3+ VIEW COMPARISON:  Chest x-ray of 12/31/2015 FINDINGS: On the images obtained, no acute left rib fracture is seen. The current images are very obliqued and standard rib detail views would be helpful. The lungs  appear to be hyperaerated consistent with an element of emphysema. IMPRESSION: 1. No acute left rib fracture seen on the images obtained. Please see above. 2. Emphysema. Electronically Signed   By: Ivar Drape M.D.   On: 05/05/2016 13:47   Dg Chest Port 1 View  Result Date: 05/31/2016 CLINICAL DATA:  Congestive cough with shortness of breath EXAM: PORTABLE  CHEST 1 VIEW COMPARISON:  05/29/2016 FINDINGS: Cardiac shadow is stable. Persistent left basilar infiltrate with associated effusion is noted. Blunting of the right costophrenic angle is noted. There is been some clearing in the left mid lung when compared with the prior exam. IMPRESSION: Persistent changes in the left base although interval clearing in the left mid lung is noted. Electronically Signed   By: Inez Catalina M.D.   On: 05/31/2016 09:27   Dg Chest Port 1 View  Result Date: 05/29/2016 CLINICAL DATA:  Cough, shortness of breath, weakness EXAM: PORTABLE CHEST 1 VIEW COMPARISON:  05/28/2016 FINDINGS: Patchy opacities within the left upper lobe and left base concerning for pneumonia. Hyperinflation compatible with COPD. Heart is borderline in size. Suspect small effusions. IMPRESSION: Continued left lower lobe opacity. New left upper lobe airspace disease. Findings concerning for pneumonia. Small effusions. COPD. Electronically Signed   By: Rolm Baptise M.D.   On: 05/29/2016 10:56  Dg Chest Port 1 View  Result Date: 05/28/2016 CLINICAL DATA:  Acute shortness of breath and hypoxia today. EXAM: PORTABLE CHEST 1 VIEW COMPARISON:  05/20/2016 and prior radiographs FINDINGS: Cardiomegaly again noted. Left lower lung atelectasis versus airspace disease noted. Trace bilateral pleural effusions are present. There is no evidence of pneumothorax or pulmonary edema. No acute bony abnormalities are identified. IMPRESSION: Left lower lung atelectasis versus airspace disease. Pneumonia not excluded. Trace bilateral pleural effusions. Electronically Signed   By:  Margarette Canada M.D.   On: 05/28/2016 10:56  Dg Abd Acute W/chest  Result Date: 05/20/2016 CLINICAL DATA:  Left lower abdominal pain for 4-5 days EXAM: DG ABDOMEN ACUTE W/ 1V CHEST COMPARISON:  05/16/2016 FINDINGS: There is no evidence of dilated bowel loops or free intraperitoneal air. No radiopaque calculi or other significant radiographic abnormality is seen. Heart size and mediastinal contours are within normal limits. The lungs are hyperinflated likely secondary to COPD. IMPRESSION: Negative abdominal radiographs.  No acute cardiopulmonary disease. Electronically Signed   By: Kathreen Devoid   On: 05/20/2016 15:37   Dg Abd Acute W/chest  Result Date: 05/16/2016 CLINICAL DATA:  82 year old male with history of bilateral inguinal hernias and discomfort for the past 3 days EXAM: DG ABDOMEN ACUTE W/ 1V CHEST COMPARISON:  Chest x-ray 05/05/2016. FINDINGS: Lung volumes appear slightly increased and there are mild emphysematous changes noted. Architectural distortion at the base of the left lung, similar to prior studies, favored to reflect areas of chronic scarring. Small bilateral pleural effusions. No evidence of pulmonary edema. No suspicious appearing pulmonary nodules or masses. Heart size is normal. Upper mediastinal contours are within normal limits. Aortic atherosclerosis. Gas and stool are noted throughout the colon extending to the level of the distal rectum. No pathologic dilatation of small bowel is noted. Several nondilated gas-filled loops of small bowel are noted, predominantly in the left side of the abdomen. No pneumoperitoneum. IMPRESSION: 1. Nonobstructive bowel gas pattern. 2. No pneumoperitoneum. 3. Small bilateral pleural effusions. 4. Probable scarring the left lung base. 5. Aortic atherosclerosis. Electronically Signed   By: Vinnie Langton M.D.   On: 05/16/2016 15:32    Microbiology: Recent Results (from the past 240 hour(s))  Surgical pcr screen     Status: None   Collection Time:  05/26/16  5:44 PM  Result Value Ref Range Status   MRSA, PCR NEGATIVE NEGATIVE Final   Staphylococcus aureus NEGATIVE NEGATIVE Final    Comment:        The Xpert SA Assay (FDA approved for NASAL specimens in  patients over 22 years of age), is one component of a comprehensive surveillance program.  Test performance has been validated by [facility] for patients greater than or equal to 66 year old. It is not intended to diagnose infection nor to guide or monitor treatment.      Labs: Basic Metabolic Panel:  Recent Labs Lab 05/27/16 0528 05/28/16 [id number] 05/29/16 0503 05/30/16 0445 06/01/16 0528 06/02/16 0529  NA 139 137 134* 136 139 138  K 3.2* 4.2 3.8 3.5 2.9* 3.4*  CL 109 107 104 107 105 102  CO2 24 25 25 24 28 30   GLUCOSE 95 109* 110* 87 102* 100*  BUN 42* 41* 42* 38* 41* 51*  CREATININE 1.55* 1.67* 1.52* 1.22 1.21 1.03  CALCIUM 7.5* 7.5* 7.4* 7.3* 7.4* 7.3*  MG 1.7 1.9 2.2 1.8 1.6*  --    Liver Function Tests:  Recent Labs Lab 06/01/16 0528 06/02/16 0529  AST 16 16  ALT 11* 12*  ALKPHOS 70 71  BILITOT 0.8 0.3  PROT 5.4* 5.5*  ALBUMIN 2.4* 2.2*   No results for input(s): LIPASE, AMYLASE in the last 168 hours. No results for input(s): AMMONIA in the last 168 hours. CBC:  Recent Labs Lab 05/28/16 0508 05/29/16 0503 05/30/16 0445 06/01/16 0528 06/02/16 0529  WBC 10.9* 9.0 10.0 9.2 7.7  HGB 10.3* 9.6* 9.7* 9.8* 9.3*  HCT 30.3* 29.1* 28.6* 28.8* 28.0*  MCV 91.3 91.5 91.4 90.3 90.6  PLT 99* 102* 123* 137* 144*   Cardiac Enzymes: No results for input(s): CKTOTAL, CKMB, CKMBINDEX, TROPONINI in the last 168 hours. BNP: BNP (last 3 results)  Recent Labs  12/31/15 1720  BNP 116.5*    ProBNP (last 3 results) No results for input(s): PROBNP in the last 8760 hours.  CBG:  Recent Labs Lab 06/01/16 2015 06/01/16 2358 06/02/16 0418 06/02/16 0711 06/02/16 1206  GLUCAP 191* 83 103* 91 169*       Signed:  Kelvin Cellar MD.  [facility] 06/02/2016, 12:46 PM

## 2016-06-02 NOTE — Clinical Social Work Placement (Signed)
   CLINICAL SOCIAL WORK PLACEMENT  NOTE  Date:  06/02/2016  Patient Details  Name: Cory Lawrence MRN: [id number] Date of Birth: 18-Dec-1924  Clinical Social Work is seeking post-discharge placement for this patient at the [place] level of care (*CSW will initial, date and re-position this form in  chart as items are completed):  Yes   Patient/family provided with [facility] Work Department's list of facilities offering this level of care within the geographic area requested by the patient (or if unable, by the patient's family).  Yes   Patient/family informed of their freedom to choose among providers that offer the needed level of care, that participate in Medicare, Medicaid or managed care program needed by the patient, have an available bed and are willing to accept the patient.  Yes   Patient/family informed of [facility]'s ownership interest in [facility] and [facility], as well as of the fact that they are under no obligation to receive care at these facilities.  PASRR submitted to EDS on 05/27/16     PASRR number received on 05/27/16     Existing PASRR number confirmed on       FL2 transmitted to all facilities in geographic area requested by pt/family on       FL2 transmitted to all facilities within larger geographic area on 05/26/16     Patient informed that his/her managed care company has contracts with or will negotiate with certain facilities, including the following:  [facility], [place]         Patient/family informed of bed offers received.  Patient chooses bed at [facility], [place]     Physician recommends and patient chooses bed at [facility], [place]    Patient to be transferred to [facility], [place] on 06/02/16.  Patient to be transferred to facility by [facility]     Patient family notified on 06/02/16 of transfer.  Name of family member notified:  Wife:Cory Lawrence     PHYSICIAN        Additional Comment:    _______________________________________________ Lia Hopping, Ramos 06/02/2016, 1:24 PM

## 2016-06-25 ENCOUNTER — Other Ambulatory Visit: Payer: Self-pay | Admitting: Physician Assistant

## 2016-07-07 ENCOUNTER — Telehealth: Payer: Self-pay

## 2016-07-07 NOTE — Telephone Encounter (Signed)
Cory Lawrence with [facility] [employer] stated Cory Lawrence need [facility]. Patient need a skilled nurse and physical therapy. He is high risk for falls. Cindy request to fax orders at the assisting living [place]. Phone number at facility [phone]. Cindy's number [phone] EXT: [phone].

## 2016-07-08 NOTE — Telephone Encounter (Signed)
Dr. Everlene Farrier ok?

## 2016-07-08 NOTE — Telephone Encounter (Signed)
Please fax everything they need. He certainly needs physical therapy and skilled nursing.

## 2016-07-16 ENCOUNTER — Encounter: Payer: Self-pay | Admitting: Physician Assistant

## 2016-07-29 ENCOUNTER — Encounter (HOSPITAL_COMMUNITY): Payer: Self-pay

## 2016-08-11 DIAGNOSIS — J189 Pneumonia, unspecified organism: Secondary | ICD-10-CM

## 2016-08-11 HISTORY — DX: Pneumonia, unspecified organism: J18.9

## 2016-08-12 ENCOUNTER — Emergency Department (HOSPITAL_COMMUNITY): Payer: Medicare Other

## 2016-08-12 ENCOUNTER — Observation Stay (HOSPITAL_COMMUNITY)
Admission: EM | Admit: 2016-08-12 | Discharge: 2016-08-13 | Disposition: A | Discharge status: Skilled Nursing Facility | Payer: Medicare Other | Attending: Internal Medicine | Admitting: Internal Medicine

## 2016-08-12 ENCOUNTER — Encounter (HOSPITAL_COMMUNITY): Payer: Self-pay

## 2016-08-12 DIAGNOSIS — I2510 Atherosclerotic heart disease of native coronary artery without angina pectoris: Secondary | ICD-10-CM | POA: Diagnosis not present

## 2016-08-12 DIAGNOSIS — I252 Old myocardial infarction: Secondary | ICD-10-CM | POA: Diagnosis not present

## 2016-08-12 DIAGNOSIS — B952 Enterococcus as the cause of diseases classified elsewhere: Secondary | ICD-10-CM | POA: Diagnosis not present

## 2016-08-12 DIAGNOSIS — J449 Chronic obstructive pulmonary disease, unspecified: Secondary | ICD-10-CM | POA: Insufficient documentation

## 2016-08-12 DIAGNOSIS — Z7952 Long term (current) use of systemic steroids: Secondary | ICD-10-CM | POA: Insufficient documentation

## 2016-08-12 DIAGNOSIS — F0390 Unspecified dementia without behavioral disturbance: Secondary | ICD-10-CM | POA: Diagnosis not present

## 2016-08-12 DIAGNOSIS — K745 Biliary cirrhosis, unspecified: Secondary | ICD-10-CM | POA: Insufficient documentation

## 2016-08-12 DIAGNOSIS — K219 Gastro-esophageal reflux disease without esophagitis: Secondary | ICD-10-CM | POA: Diagnosis not present

## 2016-08-12 DIAGNOSIS — D638 Anemia in other chronic diseases classified elsewhere: Secondary | ICD-10-CM | POA: Diagnosis not present

## 2016-08-12 DIAGNOSIS — I480 Paroxysmal atrial fibrillation: Secondary | ICD-10-CM | POA: Insufficient documentation

## 2016-08-12 DIAGNOSIS — Z66 Do not resuscitate: Secondary | ICD-10-CM | POA: Insufficient documentation

## 2016-08-12 DIAGNOSIS — M353 Polymyalgia rheumatica: Secondary | ICD-10-CM | POA: Diagnosis not present

## 2016-08-12 DIAGNOSIS — Z7901 Long term (current) use of anticoagulants: Secondary | ICD-10-CM | POA: Diagnosis not present

## 2016-08-12 DIAGNOSIS — Z515 Encounter for palliative care: Secondary | ICD-10-CM | POA: Diagnosis not present

## 2016-08-12 DIAGNOSIS — J9691 Respiratory failure, unspecified with hypoxia: Secondary | ICD-10-CM | POA: Diagnosis not present

## 2016-08-12 DIAGNOSIS — B965 Pseudomonas (aeruginosa) (mallei) (pseudomallei) as the cause of diseases classified elsewhere: Secondary | ICD-10-CM | POA: Diagnosis not present

## 2016-08-12 DIAGNOSIS — I130 Hypertensive heart and chronic kidney disease with heart failure and stage 1 through stage 4 chronic kidney disease, or unspecified chronic kidney disease: Secondary | ICD-10-CM | POA: Insufficient documentation

## 2016-08-12 DIAGNOSIS — Z881 Allergy status to other antibiotic agents status: Secondary | ICD-10-CM | POA: Insufficient documentation

## 2016-08-12 DIAGNOSIS — E1122 Type 2 diabetes mellitus with diabetic chronic kidney disease: Secondary | ICD-10-CM | POA: Diagnosis not present

## 2016-08-12 DIAGNOSIS — F419 Anxiety disorder, unspecified: Secondary | ICD-10-CM | POA: Diagnosis not present

## 2016-08-12 DIAGNOSIS — N390 Urinary tract infection, site not specified: Secondary | ICD-10-CM | POA: Insufficient documentation

## 2016-08-12 DIAGNOSIS — N183 Chronic kidney disease, stage 3 (moderate): Secondary | ICD-10-CM | POA: Insufficient documentation

## 2016-08-12 DIAGNOSIS — E119 Type 2 diabetes mellitus without complications: Secondary | ICD-10-CM

## 2016-08-12 DIAGNOSIS — E785 Hyperlipidemia, unspecified: Secondary | ICD-10-CM | POA: Diagnosis not present

## 2016-08-12 DIAGNOSIS — I5022 Chronic systolic (congestive) heart failure: Secondary | ICD-10-CM | POA: Insufficient documentation

## 2016-08-12 DIAGNOSIS — N400 Enlarged prostate without lower urinary tract symptoms: Secondary | ICD-10-CM | POA: Insufficient documentation

## 2016-08-12 DIAGNOSIS — J189 Pneumonia, unspecified organism: Secondary | ICD-10-CM | POA: Diagnosis not present

## 2016-08-12 DIAGNOSIS — Z79899 Other long term (current) drug therapy: Secondary | ICD-10-CM | POA: Insufficient documentation

## 2016-08-12 DIAGNOSIS — Z87891 Personal history of nicotine dependence: Secondary | ICD-10-CM | POA: Insufficient documentation

## 2016-08-12 DIAGNOSIS — J181 Lobar pneumonia, unspecified organism: Secondary | ICD-10-CM

## 2016-08-12 HISTORY — DX: Pneumonia, unspecified organism: J18.9

## 2016-08-12 LAB — URINALYSIS, ROUTINE W REFLEX MICROSCOPIC
BILIRUBIN URINE: NEGATIVE
Glucose, UA: NEGATIVE mg/dL
Ketones, ur: NEGATIVE mg/dL
NITRITE: POSITIVE — AB
Protein, ur: 100 mg/dL — AB
Specific Gravity, Urine: 1.02 (ref 1.005–1.030)
pH: 6 (ref 5.0–8.0)

## 2016-08-12 LAB — CBC WITH DIFFERENTIAL/PLATELET
Basophils Absolute: 0 10*3/uL (ref 0.0–0.1)
Basophils Relative: 0 %
EOS PCT: 1 %
Eosinophils Absolute: 0.1 10*3/uL (ref 0.0–0.7)
HCT: 30 % — ABNORMAL LOW (ref 39.0–52.0)
Hemoglobin: 9.6 g/dL — ABNORMAL LOW (ref 13.0–17.0)
LYMPHS ABS: 1.1 10*3/uL (ref 0.7–4.0)
LYMPHS PCT: 8 %
MCH: 29.9 pg (ref 26.0–34.0)
MCHC: 32 g/dL (ref 30.0–36.0)
MCV: 93.5 fL (ref 78.0–100.0)
MONO ABS: 0.8 10*3/uL (ref 0.1–1.0)
Monocytes Relative: 6 %
Neutro Abs: 11.7 10*3/uL — ABNORMAL HIGH (ref 1.7–7.7)
Neutrophils Relative %: 85 %
PLATELETS: 170 10*3/uL (ref 150–400)
RBC: 3.21 MIL/uL — AB (ref 4.22–5.81)
RDW: 15.5 % (ref 11.5–15.5)
WBC: 13.7 10*3/uL — AB (ref 4.0–10.5)

## 2016-08-12 LAB — URINE MICROSCOPIC-ADD ON

## 2016-08-12 LAB — I-STAT TROPONIN, ED: Troponin i, poc: 0.04 ng/mL (ref 0.00–0.08)

## 2016-08-12 LAB — MRSA PCR SCREENING: MRSA BY PCR: NEGATIVE

## 2016-08-12 LAB — COMPREHENSIVE METABOLIC PANEL
ALT: 37 U/L (ref 17–63)
AST: 35 U/L (ref 15–41)
Albumin: 2.4 g/dL — ABNORMAL LOW (ref 3.5–5.0)
Alkaline Phosphatase: 64 U/L (ref 38–126)
Anion gap: 7 (ref 5–15)
BILIRUBIN TOTAL: 0.8 mg/dL (ref 0.3–1.2)
BUN: 36 mg/dL — ABNORMAL HIGH (ref 6–20)
CALCIUM: 8 mg/dL — AB (ref 8.9–10.3)
CHLORIDE: 105 mmol/L (ref 101–111)
CO2: 27 mmol/L (ref 22–32)
CREATININE: 1.29 mg/dL — AB (ref 0.61–1.24)
GFR, EST AFRICAN AMERICAN: 55 mL/min — AB (ref >60)
GFR, EST NON AFRICAN AMERICAN: 47 mL/min — AB (ref >60)
Glucose, Bld: 126 mg/dL — ABNORMAL HIGH (ref 65–99)
Potassium: 3.6 mmol/L (ref 3.5–5.1)
Sodium: 139 mmol/L (ref 135–145)
TOTAL PROTEIN: 6.2 g/dL — AB (ref 6.5–8.1)

## 2016-08-12 LAB — I-STAT CG4 LACTIC ACID, ED: LACTIC ACID, VENOUS: 1.81 mmol/L (ref 0.5–1.9)

## 2016-08-12 LAB — STREP PNEUMONIAE URINARY ANTIGEN: Strep Pneumo Urinary Antigen: NEGATIVE

## 2016-08-12 LAB — BRAIN NATRIURETIC PEPTIDE: B NATRIURETIC PEPTIDE 5: 282 pg/mL — AB (ref 0.0–100.0)

## 2016-08-12 MED ORDER — FUROSEMIDE 20 MG PO TABS
20.0000 mg | ORAL_TABLET | Freq: Every day | ORAL | Status: DC
  Administered 2016-08-12 – 2016-08-13 (×2): 20 mg via ORAL
  Filled 2016-08-12 (×2): qty 1

## 2016-08-12 MED ORDER — ISOSORBIDE MONONITRATE ER 30 MG PO TB24
30.0000 mg | ORAL_TABLET | Freq: Every day | ORAL | Status: DC
  Administered 2016-08-12 – 2016-08-13 (×2): 30 mg via ORAL
  Filled 2016-08-12 (×2): qty 1

## 2016-08-12 MED ORDER — PREDNISONE 1 MG PO TABS
4.0000 mg | ORAL_TABLET | Freq: Every day | ORAL | Status: DC
  Administered 2016-08-13: 4 mg via ORAL
  Filled 2016-08-12: qty 4

## 2016-08-12 MED ORDER — MELATONIN 3 MG PO TABS
3.0000 mg | ORAL_TABLET | Freq: Every day | ORAL | Status: DC
  Administered 2016-08-12: 3 mg via ORAL
  Filled 2016-08-12: qty 1

## 2016-08-12 MED ORDER — DEXTROSE 5 % IV SOLN
500.0000 mg | Freq: Once | INTRAVENOUS | Status: AC
  Administered 2016-08-12: 500 mg via INTRAVENOUS
  Filled 2016-08-12: qty 500

## 2016-08-12 MED ORDER — ACETAMINOPHEN 325 MG PO TABS: 650.0000 mg | ORAL_TABLET | Freq: Four times a day (QID) | ORAL | Status: DC | PRN

## 2016-08-12 MED ORDER — SODIUM CHLORIDE 0.9 % IV BOLUS (SEPSIS)
1000.0000 mL | Freq: Once | INTRAVENOUS | Status: AC
  Administered 2016-08-12: 1000 mL via INTRAVENOUS

## 2016-08-12 MED ORDER — DEXTROSE 5 % IV SOLN
1.0000 g | INTRAVENOUS | Status: DC
  Administered 2016-08-13: 1 g via INTRAVENOUS
  Filled 2016-08-12: qty 10

## 2016-08-12 MED ORDER — FINASTERIDE 5 MG PO TABS
5.0000 mg | ORAL_TABLET | Freq: Every day | ORAL | Status: DC
  Administered 2016-08-12 – 2016-08-13 (×2): 5 mg via ORAL
  Filled 2016-08-12 (×2): qty 1

## 2016-08-12 MED ORDER — METOPROLOL TARTRATE 25 MG PO TABS
12.5000 mg | ORAL_TABLET | Freq: Two times a day (BID) | ORAL | Status: DC
  Administered 2016-08-12 – 2016-08-13 (×3): 12.5 mg via ORAL
  Filled 2016-08-12 (×3): qty 1

## 2016-08-12 MED ORDER — SENNOSIDES-DOCUSATE SODIUM 8.6-50 MG PO TABS: ORAL_TABLET | Freq: Every day | ORAL | Status: DC | PRN

## 2016-08-12 MED ORDER — AZITHROMYCIN 500 MG PO TABS
250.0000 mg | ORAL_TABLET | Freq: Every day | ORAL | Status: DC
  Administered 2016-08-13: 250 mg via ORAL
  Filled 2016-08-12: qty 1

## 2016-08-12 MED ORDER — ENOXAPARIN SODIUM 40 MG/0.4ML ~~LOC~~ SOLN: 40.0000 mg | SUBCUTANEOUS | Status: DC

## 2016-08-12 MED ORDER — DONEPEZIL HCL 5 MG PO TABS
5.0000 mg | ORAL_TABLET | Freq: Every day | ORAL | Status: DC
  Administered 2016-08-12: 5 mg via ORAL
  Filled 2016-08-12: qty 1

## 2016-08-12 MED ORDER — GUAIFENESIN-CODEINE 100-10 MG/5ML PO SOLN
5.0000 mL | Freq: Four times a day (QID) | ORAL | Status: DC | PRN
  Administered 2016-08-12 – 2016-08-13 (×2): 5 mL via ORAL
  Filled 2016-08-12 (×2): qty 5

## 2016-08-12 MED ORDER — FAMOTIDINE 20 MG PO TABS
20.0000 mg | ORAL_TABLET | Freq: Every day | ORAL | Status: DC
  Administered 2016-08-12 – 2016-08-13 (×2): 20 mg via ORAL
  Filled 2016-08-12 (×2): qty 1

## 2016-08-12 MED ORDER — ACETAMINOPHEN 650 MG RE SUPP: 650.0000 mg | Freq: Four times a day (QID) | RECTAL | Status: DC | PRN

## 2016-08-12 MED ORDER — LORAZEPAM 0.5 MG PO TABS: 0.2500 mg | ORAL_TABLET | Freq: Three times a day (TID) | ORAL | Status: DC | PRN

## 2016-08-12 MED ORDER — MOMETASONE FURO-FORMOTEROL FUM 200-5 MCG/ACT IN AERO
2.0000 | INHALATION_SPRAY | Freq: Two times a day (BID) | RESPIRATORY_TRACT | Status: DC
  Administered 2016-08-12 – 2016-08-13 (×2): 2 via RESPIRATORY_TRACT
  Filled 2016-08-12: qty 8.8

## 2016-08-12 MED ORDER — DOXAZOSIN MESYLATE 4 MG PO TABS
4.0000 mg | ORAL_TABLET | Freq: Every day | ORAL | Status: DC
  Administered 2016-08-12: 4 mg via ORAL
  Filled 2016-08-12: qty 1

## 2016-08-12 MED ORDER — IPRATROPIUM-ALBUTEROL 0.5-2.5 (3) MG/3ML IN SOLN
3.0000 mL | RESPIRATORY_TRACT | Status: DC | PRN
  Filled 2016-08-12: qty 3

## 2016-08-12 MED ORDER — DEXTROSE 5 % IV SOLN
1.0000 g | Freq: Once | INTRAVENOUS | Status: AC
  Administered 2016-08-12: 1 g via INTRAVENOUS
  Filled 2016-08-12: qty 10

## 2016-08-12 MED ORDER — DIGOXIN 125 MCG PO TABS
125.0000 ug | ORAL_TABLET | Freq: Every day | ORAL | Status: DC
  Administered 2016-08-12 – 2016-08-13 (×2): 125 ug via ORAL
  Filled 2016-08-12 (×2): qty 1

## 2016-08-12 MED ORDER — RIVAROXABAN 15 MG PO TABS
15.0000 mg | ORAL_TABLET | Freq: Every day | ORAL | Status: DC
  Administered 2016-08-12 – 2016-08-13 (×2): 15 mg via ORAL
  Filled 2016-08-12 (×2): qty 1

## 2016-08-12 MED ORDER — QUETIAPINE 12.5 MG HALF TABLET
12.5000 mg | ORAL_TABLET | Freq: Every day | ORAL | Status: DC
  Administered 2016-08-12 – 2016-08-13 (×2): 12.5 mg via ORAL
  Filled 2016-08-12 (×2): qty 1

## 2016-08-12 MED ORDER — ENSURE ENLIVE PO LIQD
237.0000 mL | Freq: Three times a day (TID) | ORAL | Status: DC
  Administered 2016-08-12 – 2016-08-13 (×5): 237 mL via ORAL

## 2016-08-12 MED ORDER — IPRATROPIUM-ALBUTEROL 0.5-2.5 (3) MG/3ML IN SOLN
3.0000 mL | Freq: Once | RESPIRATORY_TRACT | Status: AC
  Administered 2016-08-12: 3 mL via RESPIRATORY_TRACT
  Filled 2016-08-12: qty 3

## 2016-08-12 NOTE — Progress Notes (Signed)
Initial Nutrition Assessment  DOCUMENTATION CODES:   Non-severe (moderate) malnutrition in context of acute illness/injury  INTERVENTION:   -Continue Ensure Enlive po TID, each supplement provides 350 kcal and 20 grams of protein -Provide chopped meats at meals for ease of intake  NUTRITION DIAGNOSIS:   Malnutrition related to chronic illness as evidenced by energy intake < or equal to 75% for > or equal to 1 month, mild depletion of body fat, moderate depletion of body fat, mild depletion of muscle mass, moderate depletions of muscle mass.  GOAL:   Patient will meet greater than or equal to 90% of their needs  MONITOR:   PO intake, Supplement acceptance, Labs, Weight trends, Skin, I & O's  REASON FOR ASSESSMENT:   Consult, Malnutrition Screening Tool Assessment of nutrition requirement/status  ASSESSMENT:   82 year old male with past medical history of systolic congestive heart failure, CAD, chronic kidney disease, COPD, diabetes type 2, atrial fibrillation, dementia, and acid reflux who presents from his assisted living facility with shortness of breath.  Pt admitted with CAP. Pt is a resident of [facility].   Hx obtained mainly from pt wife at bedside (who is very involved in pt care), due to pt somnolence. Pt wife reports pt with a general decline in health over the past 3 months, since previous hospitalization for inguinal hernia repair. Pt wife reports that pt ate very little during prior hospitalization, however, intake improved when pt was discharged to [facility]. Pt was receiving a regular diet PTA and pt would generally consume 100% of his meals, except one day per week, when pt was served a meal he did not care for (sandwiches). Pt wife reports she visits him daily during meal times to assist with intake.   Over the past 2 weeks, pt appetite has been very poor. Per pt wife, pt needed a lot of encouragement for him to consume 50% of meal. Pt wife did encourage protein foods  and he would consume at least the meat off of his sandwich at meals, if anything else. Meal tray at bedside has been untouched due to pt somnolence, however, pt consumed 100% of Ensure shake.   Pt wife reports pt tolerates Ensure well at SNF as well.   Reviewed wt hx. Noted no current wt reading. Pt wife reports pt has progressively lost about 40# over the past 4 years, but has regained lost weight due to recent hospitalization.   Nutrition-Focused physical exam completed. Findings are mild to moderate fat depletion, mild to moderate muscle depletion, and no edema.   Discussed importance of good meal and supplement intake to promote healing. Pt wife agreeable to continue Ensure supplements and modify meat textures for better acceptance.  Labs reviewed.   Diet Order:  Diet regular Room service appropriate? Yes; Fluid consistency: Thin  Skin:  Reviewed, no issues  Last BM:  08/11/16  Height:   Ht Readings from Last 1 Encounters:  05/20/16 5\' 5"  (1.651 m)    Weight:   Wt Readings from Last 1 Encounters:  05/20/16 120 lb (54.4 kg)    Ideal Body Weight:  61.8 kg  BMI:  Estimated body mass index is 19.97 kg/m as calculated from the following:   Height as of 05/20/16: 5\' 5"  (1.651 m).   Weight as of 05/20/16: 120 lb (54.4 kg).  Estimated Nutritional Needs:   Kcal:  1400-1600  Protein:  65-80 grams  Fluid:  1.4-1.6 L  EDUCATION NEEDS:   Education needs addressed  Kanyah Matsushima A. Jimmye Norman,  RD, LDN, CDE Pager: [phone] After hours Pager: [phone]

## 2016-08-12 NOTE — Progress Notes (Signed)
Patient arrived to the unit, stated no pain, oriented to the unit and wife at bedside.  Cory Loa [name], RN

## 2016-08-12 NOTE — ED Provider Notes (Signed)
[facility] DEPT Provider Note   CSN: [id number] Arrival date & time: 08/12/16  0734     History   Chief Complaint Chief Complaint  Patient presents with  . Shortness of Breath    HPI Cory Lawrence is a 82 year old man with history of systolic CHF, CAD, CKD3, COPD, DM2, HLD, HTN, pAF on Xarelto, GERD, dementia, anxiety.  HPI  He presents with dyspnea. Started this morning. Has been coughing - productive of green mucous. He has been having more wheezing. Denies subjective fevers. He has been having chills off and on. No sick contacts. He reports he lives in an apartment with his "mother."  Denies chest pain. He has not tried lying flat. Denies PND.   Per EMS, he lives at [facility]. Initially SpO2 85% on room air. He was placed on a NRB and improved to 96%. He was also placed on CPAP.   Per his wife, he has been having chills and coughing for a few days.   History limited by dementia.   Past Medical History:  Diagnosis Date  . Anxiety   . Arthritis   . Biliary cirrhosis ([place])   . BPH (benign prostatic hypertrophy)   . CAD (coronary artery disease)    prior abnormal Myoview; subsequent cath; managed medically  . Chronic anticoagulation   . CKD (chronic kidney disease)    Stage III  . COPD (chronic obstructive pulmonary disease) ([place])   . Dementia   . Diabetes mellitus type 2, diet-controlled ([place])    managed with diet  . Diaphoresis   . Diverticulosis of colon (without mention of hemorrhage)   . Dizziness   . GERD (gastroesophageal reflux disease)   . Heart attack   . Hyperlipidemia   . Hypertension   . Internal hemorrhoids without mention of complication   . NASH (nonalcoholic steatohepatitis)   . PAF (paroxysmal atrial fibrillation) (H[facility])    on Xarelto  . Palpitations   . Personal history of colonic polyps 07/08/2002,06/2011   tubular adenoma, hyperplastic  . Ulcer ([place])    Gastric/bleeding  . Unspecified gastritis and gastroduodenitis  without mention of hemorrhage   . Vitamin B12 deficiency     Patient Active Problem List   Diagnosis Date Noted  . Acute abdominal pain   . Pre-operative cardiovascular clearance 05/25/2016  . Palliative care encounter   . Goals of care, counseling/discussion   . Dementia   . Acute on chronic renal insufficiency 05/24/2016  . Malnutrition of moderate degree 05/22/2016  . Acute delirium 05/22/2016  . Inguinal hernia 05/20/2016  . Bilateral inguinal hernia without obstruction or gangrene 05/20/2016  . LLQ abdominal pain 05/20/2016  . Constipation 05/20/2016  . Anemia, chronic disease 05/20/2016  . Generalized weakness 05/20/2016  . CKD (chronic kidney disease) stage 3, GFR 30-59 ml/min 05/20/2016  . Chronic anticoagulation 05/20/2016  . Elevated serum creatinine   . SOB (shortness of breath)   . Essential hypertension   . Type 2 diabetes mellitus with complication ([place])   . COPD exacerbation ([place]) 12/31/2015  . OA (osteoarthritis) 12/02/2015  . ESR raised 07/22/2014  . Anemia 02/18/2014  . Weight loss 12/11/2013  . LV dysfunction-EF 40-45% 03/02/2012  . PAF (paroxysmal atrial fibrillation) ([place]) 02/22/2012  . Vitamin B12 deficiency 09/29/2011  . COPD with bronchitis 09/29/2011  . History of gastroesophageal reflux (GERD) 09/29/2011  . Diabetes mellitus type 2, diet-controlled ([place])   . Other B-complex deficiencies 05/24/2011  . Other general symptoms  05/17/2011  . NASH (nonalcoholic  steatohepatitis) 05/17/2011  . Cirrhosis of liver not due to alcohol  05/17/2011  . Personal history of colonic polyps 05/17/2011  . CAD-known CTO RCA with L-R collaterals 03/17/2011  . Syncope and collapse   . Dizziness   . Diaphoresis   . Chest pain   . Chest tightness   . Palpitations   . COPD (chronic obstructive pulmonary disease) ([place])   . GERD (gastroesophageal reflux disease)   . BPH (benign prostatic hypertrophy)   . HYPERLIPIDEMIA 11/19/2007  . OBESITY 11/19/2007  .  HYPERTENSION 11/19/2007  . INTERNAL HEMORRHOIDS 11/19/2007  . GERD 11/19/2007  . DIVERTICULOSIS, COLON 11/19/2007  . BILIARY CIRRHOSIS, PRIMARY 11/19/2007  . BENIGN PROSTATIC HYPERTROPHY, HX OF 11/19/2007  . COLONIC POLYPS, ADENOMATOUS 07/08/2002    Past Surgical History:  Procedure Laterality Date  . CARDIAC CATHETERIZATION  March 2013   Managed medically  . CATARACT EXTRACTION     bilateral  . EYE SURGERY     left eye x multiple  . INGUINAL HERNIA REPAIR Left 05/27/2016   Procedure: LEFT INGUINAL HERNIA REPAIR WITH MESH;  Surgeon: Armandina Gemma, MD;  Location: [facility];  Service: General;  Laterality: Left;  . INSERTION OF MESH Left 05/27/2016   Procedure: INSERTION OF MESH;  Surgeon: Armandina Gemma, MD;  Location: [facility];  Service: General;  Laterality: Left;       Home Medications    Prior to Admission medications   Medication Sig Start Date End Date Taking? Authorizing Provider  Amino Acids-Protein Hydrolys (FEEDING SUPPLEMENT, PRO-STAT SUGAR FREE 64,) LIQD Take 30 mLs by mouth 2 (two) times daily.   Yes Historical Provider, MD  cyanocobalamin (,VITAMIN B-12,) 1000 MCG/ML injection INJECT 1 ML (1,000 MCG TOTAL) INTO THE MUSCLE EVERY 30 (THIRTY) DAYS. 02/25/14  Yes Inda Castle, MD  digoxin (LANOXIN) 0.125 MG tablet TAKE 1 TABLET BY MOUTH DAILY 06/25/16  Yes [name], MD  donepezil (ARICEPT) 5 MG tablet Take 1 tablet (5 mg total) by mouth at bedtime. 05/05/16  Yes [name], MD  doxazosin (CARDURA) 4 MG tablet Take 1 tablet (4 mg total) by mouth at bedtime. 12/10/15  Yes [name], MD  Fe-Succ Ac-C-Thre Ac-B12-FA (FERREX 150 FORTE PLUS) 50-100 MG CAPS Take 1 capsule by mouth daily. 11/19/12  Yes Sable Feil, MD  feeding supplement, ENSURE ENLIVE, (ENSURE ENLIVE) LIQD Take 237 mLs by mouth 3 (three) times daily between meals. 06/02/16  Yes Kelvin Cellar, MD  finasteride (PROSCAR) 5 MG tablet Take 1 tablet (5 mg total) by mouth daily. 10/07/14  Yes [name], MD    Fluticasone-Salmeterol (ADVAIR DISKUS) 250-50 MCG/DOSE AEPB Inhale 1 puff into the lungs as needed. Shortness of breath. Patient taking differently: Inhale 1 puff into the lungs 2 (two) times daily. Shortness of breath. 09/10/15  Yes [name], MD  furosemide (LASIX) 20 MG tablet Take 20 mg by mouth daily.   Yes Historical Provider, MD  HYDROcodone-acetaminophen (NORCO/VICODIN) 5-325 MG tablet Take 1 tablet by mouth every 6 (six) hours as needed for moderate pain. 06/02/16  Yes Kelvin Cellar, MD  Ipratropium-Albuterol (COMBIVENT) 20-100 MCG/ACT AERS respimat Inhale 1 puff into the lungs every 6 (six) hours as needed for wheezing. 09/10/15  Yes [name], MD  ipratropium-albuterol (DUONEB) 0.5-2.5 (3) MG/3ML SOLN Take 3 mLs by nebulization every 6 (six) hours as needed. Patient taking differently: Take 3 mLs by nebulization See admin instructions. Pt uses 1 vial via nebulizer every morning, and also may use  1 vial every 6 hours as needed for SOB 01/07/16  Yes [name], MD  isosorbide mononitrate (IMDUR) 30 MG 24 hr tablet TAKE 1 TABLET (30 MG TOTAL) BY MOUTH DAILY. 01/09/16  Yes [name], MD  LORazepam (ATIVAN) 0.5 MG tablet Take 0.25 mg by mouth every 8 (eight) hours as needed for anxiety.   Yes Historical Provider, MD  Melatonin 5 MG TABS Take 5 mg by mouth at bedtime.   Yes Historical Provider, MD  metoprolol tartrate (LOPRESSOR) 25 MG tablet Take 0.5 tablets (12.5 mg total) by mouth 2 (two) times daily. 06/02/16  Yes Kelvin Cellar, MD  predniSONE (DELTASONE) 1 MG tablet TAKE 4 TABLETS DAILY FOR DOSAGE OF 4 MG A DAY Patient taking differently: TAKE 4 MG BY MOUTH DAILY 02/16/16  Yes [name], MD  Prenatal Vit-Fe Fumarate-FA (MULTIVITAMIN-PRENATAL) 27-0.8 MG TABS tablet Take 1 tablet by mouth daily at 12 noon.   Yes Historical Provider, MD  QUEtiapine (SEROQUEL) 25 MG tablet Take 6.25 mg by mouth 2 (two) times daily.   Yes Historical Provider, MD  ranitidine (ZANTAC) 150 MG tablet  Take 150 mg by mouth 2 (two) times daily.   Yes Historical Provider, MD  sennosides-docusate sodium (SENOKOT-S) 8.6-50 MG tablet Take 1 tablet by mouth daily as needed for constipation.   Yes Historical Provider, MD  simvastatin (ZOCOR) 40 MG tablet TAKE 1 TABLET BY MOUTH EVERY DAY AT BEDTIME 01/16/16  Yes [name], MD  ursodiol (ACTIGALL) 250 MG tablet TAKE 1 TABLET BY MOUTH TWICE A DAY 05/06/16  Yes [name], MD  XARELTO 15 MG TABS tablet TAKE 1 TABLET BY MOUTH EVERY DAY Patient taking differently: TAKE 15 MG BY MOUTH DAILY 01/09/16  Yes [name], MD  diclofenac sodium (VOLTAREN) 1 % GEL APPLY 2 GRAMS TOPICALLY 3 TIMES DAILY Patient not taking: Reported on 08/12/2016 12/08/15   [name], MD  pantoprazole (PROTONIX) 40 MG tablet TAKE 1 TABLET (40 MG TOTAL) BY MOUTH DAILY. Patient not taking: Reported on 08/12/2016 04/01/15   Inda Castle, MD    Family History Family History  Problem Relation Age of Onset  . Heart failure Mother   . Diabetes Mother   . Cancer Mother     unknown type  . Diabetes Sister   . Heart failure Sister   . Colon cancer Neg Hx     Social History Social History  Substance Use Topics  . Smoking status: Former Research scientist (life sciences)  . Smokeless tobacco: Former Systems developer    Quit date: 05/31/1996  . Alcohol use No     Allergies   Ciprofloxacin hcl and Penicillins   Review of Systems Review of Systems Constitutional: no fevers, +chills Eyes: no vision changes Ears, nose, mouth, throat, and face: +cough Respiratory: +shortness of breath Cardiovascular: no chest pain Gastrointestinal: no nausea/vomiting, no abdominal pain, no constipation, no diarrhea Genitourinary: no dysuria, no hematuria Integument: no rash Hematologic/lymphatic: no bleeding/bruising, no edema Musculoskeletal: no arthralgias, no myalgias Neurological: no paresthesias, no weakness   Physical Exam Updated Vital Signs BP 132/56 (BP Location: Right Arm)   Pulse 91   Temp (S) 101.7 F  (38.7 C) (Rectal) Comment: Resident informed of rectal temp  Resp 23   SpO2 96%   Physical Exam General Apperance: Mild respiratory distress Head: Normocephalic, atraumatic Eyes: PERRL, EOMI, anicteric sclera Ears: Normal external ear canal Nose: Nares normal, septum midline, mucosa normal Throat: Lips, mucosa and tongue normal  Neck: Supple, trachea midline  Back: No tenderness or bony abnormality  Lungs: Course breath sounds and wheezes bilaterally Chest Wall: Nontender, no deformity Heart: Regular rate and rhythm Abdomen: Soft, nontender, nondistended, no rebound/guarding Extremities: Normal, atraumatic, warm and well perfused, no edema Pulses: 2+ throughout Skin: No rashes or lesions Neurologic: Alert and interactive. CNII-XII intact. Normal strength and sensation   ED Treatments / Results  Labs (all labs ordered are listed, but only abnormal results are displayed) Labs Reviewed  CBC WITH DIFFERENTIAL/PLATELET - Abnormal; Notable for the following:       Result Value   WBC 13.7 (*)    RBC 3.21 (*)    Hemoglobin 9.6 (*)    HCT 30.0 (*)    Neutro Abs 11.7 (*)    All other components within normal limits  COMPREHENSIVE METABOLIC PANEL - Abnormal; Notable for the following:    Glucose, Bld 126 (*)    BUN 36 (*)    Creatinine, Ser 1.29 (*)    Calcium 8.0 (*)    Total Protein 6.2 (*)    Albumin 2.4 (*)    GFR calc non Af Amer 47 (*)    GFR calc Af Amer 55 (*)    All other components within normal limits  BRAIN NATRIURETIC PEPTIDE - Abnormal; Notable for the following:    B Natriuretic Peptide 282.0 (*)    All other components within normal limits  CULTURE, BLOOD (ROUTINE X 2)  CULTURE, BLOOD (ROUTINE X 2)  CULTURE, BLOOD (ROUTINE X 2)  URINE CULTURE  URINALYSIS, ROUTINE W REFLEX MICROSCOPIC (NOT AT [facility])  I-STAT CG4 LACTIC ACID, ED  Randolm Idol, ED    EKG  EKG Interpretation  Date/Time:  Friday August 12 2016 07:38:08 EDT Ventricular Rate:  85 PR  Interval:    QRS Duration: 109 QT Interval:  381 QTC Calculation: 453 R Axis:   -15 Text Interpretation:  Sinus rhythm Borderline left axis deviation Borderline repolarization abnormality No significant change since last tracing Confirmed by KNOTT MD, DANIEL [phone]) on 08/12/2016 7:50:41 AM       Radiology Dg Chest Portable 1 View  Result Date: 08/12/2016 CLINICAL DATA:  Cough and shortness of breath. EXAM: PORTABLE CHEST 1 VIEW COMPARISON:  05/31/2016 chest radiograph. FINDINGS: Stable cardiomediastinal silhouette with top-normal heart size and aortic atherosclerosis. No pneumothorax. Small left pleural effusion. No right pleural effusion. No pulmonary edema. Patchy left lung base consolidation. IMPRESSION: 1. Patchy left lung base consolidation, worrisome for aspiration for pneumonia. Recommend follow-up PA and lateral post treatment chest radiographs in 4-6 weeks. 2. Small left pleural effusion. 3. Aortic atherosclerosis. Electronically Signed   By: Ilona Sorrel M.D.   On: 08/12/2016 08:16    Procedures  Medications Ordered in ED Medications  sodium chloride 0.9 % bolus 1,000 mL (not administered)  azithromycin (ZITHROMAX) 500 mg in dextrose 5 % 250 mL IVPB (not administered)  cefTRIAXone (ROCEPHIN) 1 g in dextrose 5 % 50 mL IVPB (not administered)  ipratropium-albuterol (DUONEB) 0.5-2.5 (3) MG/3ML nebulizer solution 3 mL (3 mLs Nebulization Given 08/12/16 0813)    Initial Impression / Assessment and Plan / ED Course  I have reviewed the triage vital signs and the nursing notes.  Pertinent labs & imaging results that were available during my care of the patient were reviewed by me and considered in my medical decision making (see chart for details).  Clinical Course   8:22am 1L NS and Duoneb ordered. 8:34am Levofloxacin ordered. Changed to ceftriaxone and azithro due to cipro allergy.  Final  Clinical Impressions(s) / ED Diagnoses   Final diagnoses:  Community acquired  pneumonia of left lower lobe of lung (HCC)  Increased dyspnea, cough with purulent sputum in patient with history of COPD. Febrile to 101.7 rectally. He has a leukocytosis to 13.7. CXR with patchy left lung base consolidation. Lactic acid within normal limits. He was given 1L NS and started on ceftriaxone and azithromycin. He was also given Duoneb treatment. Consistent with community acquired pneumonia. Will have him admitted for further treatment given sepsis and hypoxemic respiratory faiure  New Prescriptions New Prescriptions   No medications on file     Milagros Loll, MD 08/12/16 [id number]    Leo Grosser, MD 08/12/16 [phone]

## 2016-08-12 NOTE — H&P (Signed)
Date: 08/12/2016               Patient Name:  Cory Lawrence MRN: [id number]  DOB: May 22, 1925 Age / Sex: 82 y.o., male   PCP: [name], MD         Medical Service: Internal Medicine Teaching Service         Attending Physician: Dr. Sid Falcon, MD    First Contact: Dr. Lovena Le Pager: [phone]  Second Contact: Dr. Charlynn Grimes Pager: [phone]       After Hours (After 5p/  First Contact Pager: [phone]  weekends / holidays): Second Contact Pager: [phone]   Chief Complaint: Shortness of breath  History of Present Illness: 82 year old male with past medical history of systolic congestive heart failure, CAD, chronic kidney disease, COPD, diabetes type 2, atrial fibrillation, dementia, and acid reflux who presents from his assisted living facility with shortness of breath. He was noted to be hypoxic to 85% on room air this morning when the nursing facility staff was called for congestion. He was placed on a nonrebreather and his O2 sat improved to 96%. He was placed on CPAP by EMS due to respiratory distress which was removed upon arrival to the ED. He wasn't placed on 4 L nasal cannula with O2 sats of 95-98%. His wife notes that patient has had chills for the past 3 days but when his temperature was checked he did not have a fever. Yesterday evening his wife concerned that his breathing was worsening. She also saw that he was having increased sputum production compared to his normal baseline, sputum is a green yellow color. In the emergency department a portable chest x-ray revealed a patchy left lung base consolidation concerning for pneumonia. He had a temperature of 101.46F rectally and a leukocytosis of 13.7. He was started on Rocephin and azithromycin. He was given a DuoNeb treatment with improvement in shortness of breath.  Meds:  Current Meds  Medication Sig  . Amino Acids-Protein Hydrolys (FEEDING SUPPLEMENT, PRO-STAT SUGAR FREE 64,) LIQD Take 30 mLs by mouth 2 (two) times  daily.  . cyanocobalamin (,VITAMIN B-12,) 1000 MCG/ML injection INJECT 1 ML (1,000 MCG TOTAL) INTO THE MUSCLE EVERY 30 (THIRTY) DAYS.  Marland Kitchen digoxin (LANOXIN) 0.125 MG tablet TAKE 1 TABLET BY MOUTH DAILY  . donepezil (ARICEPT) 5 MG tablet Take 1 tablet (5 mg total) by mouth at bedtime.  Marland Kitchen doxazosin (CARDURA) 4 MG tablet Take 1 tablet (4 mg total) by mouth at bedtime.  Marland Kitchen Fe-Succ Ac-C-Thre Ac-B12-FA (FERREX 150 FORTE PLUS) 50-100 MG CAPS Take 1 capsule by mouth daily.  . feeding supplement, ENSURE ENLIVE, (ENSURE ENLIVE) LIQD Take 237 mLs by mouth 3 (three) times daily between meals.  . finasteride (PROSCAR) 5 MG tablet Take 1 tablet (5 mg total) by mouth daily.  . Fluticasone-Salmeterol (ADVAIR DISKUS) 250-50 MCG/DOSE AEPB Inhale 1 puff into the lungs as needed. Shortness of breath. (Patient taking differently: Inhale 1 puff into the lungs 2 (two) times daily. Shortness of breath.)  . furosemide (LASIX) 20 MG tablet Take 20 mg by mouth daily.  Marland Kitchen HYDROcodone-acetaminophen (NORCO/VICODIN) 5-325 MG tablet Take 1 tablet by mouth every 6 (six) hours as needed for moderate pain.  . Ipratropium-Albuterol (COMBIVENT) 20-100 MCG/ACT AERS respimat Inhale 1 puff into the lungs every 6 (six) hours as needed for wheezing.  Marland Kitchen ipratropium-albuterol (DUONEB) 0.5-2.5 (3) MG/3ML SOLN Take 3 mLs by nebulization every 6 (six) hours as needed. (Patient taking differently: Take 3 mLs by  nebulization See admin instructions. Pt uses 1 vial via nebulizer every morning, and also may use 1 vial every 6 hours as needed for SOB)  . isosorbide mononitrate (IMDUR) 30 MG 24 hr tablet TAKE 1 TABLET (30 MG TOTAL) BY MOUTH DAILY.  Marland Kitchen LORazepam (ATIVAN) 0.5 MG tablet Take 0.25 mg by mouth every 8 (eight) hours as needed for anxiety.  . Melatonin 5 MG TABS Take 5 mg by mouth at bedtime.  . metoprolol tartrate (LOPRESSOR) 25 MG tablet Take 0.5 tablets (12.5 mg total) by mouth 2 (two) times daily.  . predniSONE (DELTASONE) 1 MG tablet TAKE 4  TABLETS DAILY FOR DOSAGE OF 4 MG A DAY (Patient taking differently: TAKE 4 MG BY MOUTH DAILY)  . Prenatal Vit-Fe Fumarate-FA (MULTIVITAMIN-PRENATAL) 27-0.8 MG TABS tablet Take 1 tablet by mouth daily at 12 noon.  . QUEtiapine (SEROQUEL) 25 MG tablet Take 6.25 mg by mouth 2 (two) times daily.  . ranitidine (ZANTAC) 150 MG tablet Take 150 mg by mouth 2 (two) times daily.  . sennosides-docusate sodium (SENOKOT-S) 8.6-50 MG tablet Take 1 tablet by mouth daily as needed for constipation.  . simvastatin (ZOCOR) 40 MG tablet TAKE 1 TABLET BY MOUTH EVERY DAY AT BEDTIME  . ursodiol (ACTIGALL) 250 MG tablet TAKE 1 TABLET BY MOUTH TWICE A DAY  . XARELTO 15 MG TABS tablet TAKE 1 TABLET BY MOUTH EVERY DAY (Patient taking differently: TAKE 15 MG BY MOUTH DAILY)     Allergies: Allergies as of 08/12/2016 - Review Complete 08/12/2016  Allergen Reaction Noted  . Ciprofloxacin hcl Swelling 05/20/2014  . Penicillins Hives    Past Medical History:  Diagnosis Date  . Anxiety   . Arthritis   . Biliary cirrhosis ([place])   . BPH (benign prostatic hypertrophy)   . CAD (coronary artery disease)    prior abnormal Myoview; subsequent cath; managed medically  . Chronic anticoagulation   . CKD (chronic kidney disease)    Stage III  . COPD (chronic obstructive pulmonary disease) ([place])   . Dementia   . Diabetes mellitus type 2, diet-controlled ([place])    managed with diet  . Diaphoresis   . Diverticulosis of colon (without mention of hemorrhage)   . Dizziness   . GERD (gastroesophageal reflux disease)   . Heart attack   . Hyperlipidemia   . Hypertension   . Internal hemorrhoids without mention of complication   . NASH (nonalcoholic steatohepatitis)   . PAF (paroxysmal atrial fibrillation) ([facility])    on Xarelto  . Palpitations   . Personal history of colonic polyps 07/08/2002,06/2011   tubular adenoma, hyperplastic  . Ulcer ([place])    Gastric/bleeding  . Unspecified gastritis and gastroduodenitis without mention  of hemorrhage   . Vitamin B12 deficiency     Family History  Problem Relation Age of Onset  . Heart failure Mother   . Diabetes Mother   . Cancer Mother     unknown type  . Diabetes Sister   . Heart failure Sister   . Colon cancer Neg Hx     Social History   Social History  . Marital status: Married    Spouse name: N/A  . Number of children: 2  . Years of education: N/A   Occupational History  . retired    Social History Main Topics  . Smoking status: Former Research scientist (life sciences)  . Smokeless tobacco: Former Systems developer    Quit date: 05/31/1996  . Alcohol use No  . Drug use: No  . Sexual activity:  No   Other Topics Concern  . Not on file   Social History Narrative  . No narrative on file    Review of Systems: A complete ROS was negative except as per HPI.  Denies abdominal pain, chest pain, constipation, diarrhea Positive for chills and shortness of breath. He has difficulty with urination secondary to BPH and has been using a Foley cath since August.   Physical Exam: Blood pressure 130/61, pulse 83, temperature (S) 101.7 F (38.7 C), temperature source (S) Rectal, resp. rate 19, SpO2 99 %. Physical Exam  Constitutional: He appears well-developed and well-nourished. No distress.  HENT:  Head: Normocephalic and atraumatic.  Mouth/Throat: Oropharynx is clear and moist. No oropharyngeal exudate.  Eyes: Conjunctivae and EOM are normal. No scleral icterus.  Cardiovascular: Normal rate, regular rhythm and normal heart sounds.  Exam reveals no gallop and no friction rub.   No murmur heard. Pulmonary/Chest: Effort normal and breath sounds normal. No respiratory distress. He has no wheezes. He has no rales.  Abdominal: Soft. Bowel sounds are normal. He exhibits no distension and no mass. There is no tenderness. There is no rebound and no guarding.  Musculoskeletal: He exhibits no edema.  Neurological: He is alert. No cranial nerve deficit.  Skin: Skin is warm and dry. No rash noted. He is  not diaphoretic. No erythema. No pallor.  Psychiatric: He has a normal mood and affect. His behavior is normal.     EKG: Normal sinus rhythm  CXR: Left lung base consolidation concerning for pneumonia  Assessment & Plan by Problem: Principal Problem:   CAP (community acquired pneumonia) Active Problems:   COPD (chronic obstructive pulmonary disease) ([place])   Diabetes mellitus type 2, diet-controlled ([place])   Chronic anticoagulation   Palliative care encounter   Dementia   Polymyalgia rheumatica (HCC)  Community-acquired pneumonia--patient has evidence of left lower lung base consolidation concerning for pneumonia on portable chest x-ray, increased sputum production, fever of 101.73F, shortness of breath requiring supplemental oxygen, and a leukocytosis. Started on azithromycin and Rocephin in the ED. Lactic acid within normal limits. - Admit to MedSurg - Continue Rocephin and azithromycin for community-acquired pneumonia - Ordered strep and Legionella urine antigen - Sputum culture ordered - Incentive spirometry  COPD-- continue home inhalers. Wean oxygen to room air when able.  Atrial fibrillation--continue Xarelto, beta blocker, and digoxin  Systolic congestive heart failure-- echo from 05/22/2016 revealed EF of 40-45% with grade 1 diastolic dysfunction. BNP elevated at 282. - Continue Lasix 20 mg daily  Dementia-- continue Aricept  Anxiety--continue Ativan 0.25 mg every 3 hours when necessary  BPH-- continue finasteride and doxazosin  PMR-- continue prednisone 4mg  qd  Diet-regular Code-DO NOT RESUSCITATE    Dispo: Admit patient to Observation with expected length of stay less than 2 midnights.  Signed: Norman Herrlich, MD 08/12/2016, 9:41 AM  Pager: [phone]

## 2016-08-12 NOTE — ED Notes (Signed)
Attempted report x 2 

## 2016-08-12 NOTE — ED Notes (Signed)
Patient placed on monitor, continuous pulse oximetry, blood pressure cuff and oxygen [place] (4L)

## 2016-08-12 NOTE — Progress Notes (Signed)
RN called RT for PRN tx. Pt's wife reports pt has been coughing for 2 hrs and it won't stop. Pt sleeping at this time. Pt with clear BS throughout. SPO2 97% on 2L. RT explained to pt's wife Albuterol will not help with a cough and pt in no distress at this time. Will hold prn tx. RT will continue to monitor.

## 2016-08-12 NOTE — ED Notes (Signed)
Attempted report x1. 

## 2016-08-12 NOTE — ED Notes (Signed)
Assisted [place]oke, RN with performing rectal temperature on patient

## 2016-08-12 NOTE — Progress Notes (Addendum)
Attempt report x1, RN to call back. Aldona Bar, RN   Report received by Aldona Bar, RN

## 2016-08-12 NOTE — ED Triage Notes (Signed)
Per EMS- pt from [facility]. Called out for congestion. Initial SpO2 85% on RA, facility placed on NRB, improved to 96%. EMS placed on CPAP because pt appeared to have some distress. Hx CHF, diabetes. Taken off CPAP on arrival to ED, placed on 4L [place], SpO2 95-98%. Cough present.

## 2016-08-12 NOTE — ED Notes (Signed)
Pt came to hospital with chronic foley per Dr. Laneta Simmers foley needed to be replaced. Peri care performed and foley was replaced. Foley care was performed after replacement of foley.

## 2016-08-13 DIAGNOSIS — N390 Urinary tract infection, site not specified: Secondary | ICD-10-CM | POA: Diagnosis not present

## 2016-08-13 DIAGNOSIS — B965 Pseudomonas (aeruginosa) (mallei) (pseudomallei) as the cause of diseases classified elsewhere: Secondary | ICD-10-CM

## 2016-08-13 DIAGNOSIS — J189 Pneumonia, unspecified organism: Secondary | ICD-10-CM | POA: Diagnosis not present

## 2016-08-13 DIAGNOSIS — Z1623 Resistance to quinolones and fluoroquinolones: Secondary | ICD-10-CM

## 2016-08-13 DIAGNOSIS — B952 Enterococcus as the cause of diseases classified elsewhere: Secondary | ICD-10-CM

## 2016-08-13 LAB — BASIC METABOLIC PANEL
ANION GAP: 7 (ref 5–15)
BUN: 32 mg/dL — ABNORMAL HIGH (ref 6–20)
CALCIUM: 8 mg/dL — AB (ref 8.9–10.3)
CO2: 29 mmol/L (ref 22–32)
CREATININE: 1.18 mg/dL (ref 0.61–1.24)
Chloride: 105 mmol/L (ref 101–111)
GFR, EST NON AFRICAN AMERICAN: 53 mL/min — AB (ref >60)
GLUCOSE: 123 mg/dL — AB (ref 65–99)
Potassium: 3.7 mmol/L (ref 3.5–5.1)
Sodium: 141 mmol/L (ref 135–145)

## 2016-08-13 LAB — CBC
HCT: 24.9 % — ABNORMAL LOW (ref 39.0–52.0)
Hemoglobin: 8 g/dL — ABNORMAL LOW (ref 13.0–17.0)
MCH: 30.1 pg (ref 26.0–34.0)
MCHC: 32.1 g/dL (ref 30.0–36.0)
MCV: 93.6 fL (ref 78.0–100.0)
PLATELETS: 146 10*3/uL — AB (ref 150–400)
RBC: 2.66 MIL/uL — ABNORMAL LOW (ref 4.22–5.81)
RDW: 15.6 % — AB (ref 11.5–15.5)
WBC: 9 10*3/uL (ref 4.0–10.5)

## 2016-08-13 MED ORDER — IPRATROPIUM-ALBUTEROL 0.5-2.5 (3) MG/3ML IN SOLN
3.0000 mL | Freq: Three times a day (TID) | RESPIRATORY_TRACT | Status: DC
  Administered 2016-08-13: 3 mL via RESPIRATORY_TRACT
  Filled 2016-08-13: qty 3

## 2016-08-13 MED ORDER — IPRATROPIUM-ALBUTEROL 0.5-2.5 (3) MG/3ML IN SOLN: 3.0000 mL | RESPIRATORY_TRACT | Status: DC

## 2016-08-13 MED ORDER — IPRATROPIUM-ALBUTEROL 0.5-2.5 (3) MG/3ML IN SOLN
3.0000 mL | Freq: Four times a day (QID) | RESPIRATORY_TRACT | Status: DC
  Administered 2016-08-13: 3 mL via RESPIRATORY_TRACT

## 2016-08-13 MED ORDER — AZITHROMYCIN 250 MG PO TABS: 250.0000 mg | ORAL_TABLET | Freq: Every day | ORAL | 0 refills | Status: AC

## 2016-08-13 MED ORDER — RIVAROXABAN 15 MG PO TABS: 15.0000 mg | ORAL_TABLET | Freq: Every day | ORAL | Status: DC

## 2016-08-13 MED ORDER — CEFUROXIME AXETIL 250 MG PO TABS: 500.0000 mg | ORAL_TABLET | Freq: Two times a day (BID) | ORAL | 0 refills | Status: AC

## 2016-08-13 NOTE — Evaluation (Signed)
Physical Therapy Evaluation Patient Details Name: Cory Lawrence MRN: [id number] DOB: Jun 14, 1925 Today's Date: 08/13/2016   History of Present Illness  82 year old male with past medical history of systolic congestive heart failure, CAD, chronic kidney disease, COPD, diabetes type 2, atrial fibrillation, dementia, and acid reflux who presents from his assisted living facility with shortness of breath..   Clinical Impression  Patient demonstrates deficits in functional mobility as indicated below. Will need continued skilled PT to address deficits and maximize function. Will see as indicated and progress as tolerated. Recommend ST SNF.    Follow Up Recommendations SNF;Supervision/Assistance - 24 hour    Equipment Recommendations  None recommended by PT    Recommendations for Other Services       Precautions / Restrictions Precautions Precautions: Fall Precaution Comments: watch O2 saturations      Mobility  Bed Mobility Overal bed mobility: Needs Assistance Bed Mobility: Supine to Sit;Sit to Supine     Supine to sit: Min assist Sit to supine: Min assist   General bed mobility comments: min assist to pull to sit at EOB, assist to rotate trunk to EOB, relaince on side rail. Assist to elevate LEs back to bed  Transfers Overall transfer level: Needs assistance Equipment used: Rolling walker (2 wheeled) Transfers: Sit to/from Stand Sit to Stand: Mod assist         General transfer comment: moderate assist to power up with 3 attempts. required elevated surface and chuck pad/gait belt support to elevate to standing. noted tremor with activity  Ambulation/Gait Ambulation/Gait assistance: Min assist;Mod assist Ambulation Distance (Feet): 16 Feet Assistive device: Rolling walker (2 wheeled) Gait Pattern/deviations: Step-through pattern;Decreased stride length;Shuffle;Trunk flexed;Narrow base of support Gait velocity: decreased   General Gait Details: patient easily  fatigues with ambulation, tolerated in room ambulation to the door and back, required increased assist for stability upon fatigue when returning to bed  Stairs            Wheelchair Mobility    Modified Rankin (Stroke Patients Only)       Balance Overall balance assessment: History of Falls                                           Pertinent Vitals/Pain Pain Assessment: Faces Faces Pain Scale: No hurt    Home Living Family/patient expects to be discharged to:: Assisted living               Home Equipment: Walker - 2 wheels;Cane - single point Additional Comments: information taken from PT evaluation; pt unable to state    Prior Function Level of Independence: Independent with assistive device(s)         Comments: uses cane PRN (information taken from PT evaluation -- pt unable to state)     Hand Dominance   Dominant Hand: Right    Extremity/Trunk Assessment   Upper Extremity Assessment: Generalized weakness (noted tremor in UE extremities)           Lower Extremity Assessment: Generalized weakness         Communication   Communication: HOH;Other (comment) (difficulty to assess due to poor arousal)  Cognition Arousal/Alertness: Awake/alert Behavior During Therapy: Flat affect Overall Cognitive Status: No family/caregiver present to determine baseline cognitive functioning                      General Comments  Exercises     Assessment/Plan    PT Assessment Patient needs continued PT services  PT Problem List Decreased mobility;Decreased balance;Decreased strength;Decreased activity tolerance;Decreased safety awareness;Cardiopulmonary status limiting activity          PT Treatment Interventions DME instruction;Gait training;Functional mobility training;Therapeutic activities;Cognitive remediation;Balance training;Therapeutic exercise    PT Goals (Current goals can be found in the Care Plan section)   Acute Rehab PT Goals Patient Stated Goal: to get better  PT Goal Formulation: With patient Time For Goal Achievement: 08/27/16 Potential to Achieve Goals: Fair    Frequency Min 2X/week   Barriers to discharge        Co-evaluation               End of Session Equipment Utilized During Treatment: Gait belt;Oxygen Activity Tolerance: Patient limited by fatigue Patient left: in bed;with call bell/phone within reach;with bed alarm set Nurse Communication: Mobility status         Time: [id number] PT Time Calculation (min) (ACUTE ONLY): 17 min   Charges:   PT Evaluation $PT Eval Moderate Complexity: 1 Procedure     PT G CodesDuncan Dull Aug 21, 2016, 11:12 AM Alben Deeds, PT DPT  [phone]

## 2016-08-13 NOTE — Progress Notes (Signed)
Contact CSW about patient's weakness/PT consult and wife wanting patient to go to SNF side of [facility] where he currently resides. CSW stated she will contact wife and work on arrangements for discharge today.

## 2016-08-13 NOTE — Progress Notes (Signed)
   Subjective: No acute events overnight. Patient says that he is feeling much better this morning. His shortness of breath and cough have both improved. He was stating that he was very hungry but complaining about the hospital food this morning. I think this was a good sign and shows that he is clinically improving and feeling better from yesterday. He had no additional questions this morning.  Objective:  Vital signs in last 24 hours: Vitals:   08/12/16 1607 08/12/16 1700 08/12/16 2200 08/13/16 0500  BP: (!) 111/37  (!) 115/43 (!) 123/42  Pulse: 77  68 65  Resp: 18  18 18   Temp: 98.2 F (36.8 C)  99.4 F (37.4 C) 98.7 F (37.1 C)  TempSrc: Oral  Oral Oral  SpO2: 97%  97% 96%  Weight:  136 lb 8 oz (61.9 kg)    Height:  5\' 6"  (1.676 m)     Physical Exam  Constitutional: He is oriented to person, place, and time. He appears well-developed and well-nourished.  HENT:  Head: Normocephalic and atraumatic.  Nasal cannula in place  Cardiovascular: Normal rate and regular rhythm.  Exam reveals no gallop and no friction rub.   No murmur heard. Respiratory: Effort normal. He has wheezes.  Diffuse wheezes scattered throughout both lung fields, no evidence of crackles  GI: Soft. Bowel sounds are normal. He exhibits no distension. There is no tenderness.  Musculoskeletal: He exhibits no edema.  Neurological: He is alert and oriented to person, place, and time.     Assessment/Plan: Mr. Simonini in an 82 year old male with multiple comorbidities including CHF, CAD, CKD, COPD, type 2 diabetes and atrial fibrillation who presents from his assisted living facility with shortness of breath, cough and a chest x-ray which demonstrated patchy left lung base consolidation concerning for pneumonia.  In summary, patient is improved on his treatment for community-acquired pneumonia. He states that he is feeling better and his shortness of breath and cough have also improved. We'll transition the patient  to oral formulations with similar coverage to ceftriaxone and continue with azithromycin and prepare for discharge either this afternoon or tomorrow morning.  1. Community-acquired pneumonia, improving  We will continue the patient on his current antimicrobial regimen to cover the most likely pathogens including strep pneumo, Haemophilus influenza and Moraxella. -- Azithromycin + ceftriaxone -- Follow-up Legionella and strep urine antigen -- Follow-up sputum culture  2. History of COPD The patient has a long history of COPD. He presented with increased shortness of breath and sputum production. Although the symptoms are consistent with a COPD exacerbation given the patient's fever and chest x-ray findings I think his presentation is most likely secondary to community-acquired pneumonia. At this time we will not treat for COPD exacerbation. We will wean the patient off of nasal cannula oxygen back to room air and continue with his home COPD medications.  -- Dulera -- Ipratropium/albuterol duo nebulizer scheduled every 6 hours secondary to increased wheezing  3. Atrial fibrillation and history of systolic congestive heart failure -- Digoxin 125 mcg daily -- Metoprolol 12.5 mg twice a day -- Xarelto 15 mg once daily -- Lasix 20 mg once daily  4. Anxiety and dementia -- Ativan 0.25 mg every 3 hours as necessary -- Continue Aricept  DVT/PE prophylaxis: Therapeutically anticoagulated for atrial fibrillation FEN/GI: Normal diet  Dispo: Anticipated discharge this afternoon or tomorrow morning .   Ophelia Shoulder, MD 08/13/2016, 6:26 AM Pager: [phone]

## 2016-08-13 NOTE — Progress Notes (Signed)
Wife phoned last evening to find out how her husband was doing.  At the time, he was sleeping, and that is what I told her. Gave her my phone number if she wanted to call again.

## 2016-08-13 NOTE — Progress Notes (Signed)
Report given to [place], Nurse at [facility]

## 2016-08-13 NOTE — Clinical Social Work Note (Signed)
CSW facilitated patient discharge including contacting patient family and facility to confirm patient discharge plans. Clinical information faxed to facility and family agreeable with plan. CSW arranged ambulance transport via [facility] to [facility] at 2:00 pm. RN to call report prior to discharge [phone] Room 804A).  CSW will sign off for now as social work intervention is no longer needed. Please consult [place] again if new needs arise.  [place]ton Scrape, [place]

## 2016-08-13 NOTE — NC FL2 (Signed)
[place] LEVEL OF CARE SCREENING TOOL     IDENTIFICATION  Patient Name: Cory Lawrence Birthdate: 1924-11-02 Sex: male Admission Date (Current Location): 08/12/2016  [facility] and [place] Number:  Herbalist and Address:  [facility] [place], [place] [address] [place], [place] [address]      Provider Number: [id number]  Attending Physician Name and Address:  Sid Falcon, MD  Relative Name and Phone Number:       Current Level of Care: Hospital Recommended Level of Care: [place] Prior Approval Number:    Date Approved/Denied:   PASRR Number: [id number]  Discharge Plan: SNF    Current Diagnoses: Patient Active Problem List   Diagnosis Date Noted  . CAP (community acquired pneumonia) 08/12/2016  . Polymyalgia rheumatica ([place]) 08/12/2016  . Acute abdominal pain   . Pre-operative cardiovascular clearance 05/25/2016  . Palliative care encounter   . Goals of care, counseling/discussion   . Dementia   . Acute on chronic renal insufficiency 05/24/2016  . Malnutrition of moderate degree 05/22/2016  . Acute delirium 05/22/2016  . Inguinal hernia 05/20/2016  . Bilateral inguinal hernia without obstruction or gangrene 05/20/2016  . LLQ abdominal pain 05/20/2016  . Constipation 05/20/2016  . Anemia, chronic disease 05/20/2016  . Generalized weakness 05/20/2016  . CKD (chronic kidney disease) stage 3, GFR 30-59 ml/min 05/20/2016  . Chronic anticoagulation 05/20/2016  . Elevated serum creatinine   . SOB (shortness of breath)   . Essential hypertension   . Type 2 diabetes mellitus with complication ([place])   . COPD exacerbation ([place]) 12/31/2015  . OA (osteoarthritis) 12/02/2015  . ESR raised 07/22/2014  . Anemia 02/18/2014  . Weight loss 12/11/2013  . LV dysfunction-EF 40-45% 03/02/2012  . PAF (paroxysmal atrial fibrillation) ([place]) 02/22/2012  . Vitamin B12 deficiency 09/29/2011  . COPD with bronchitis  09/29/2011  . History of gastroesophageal reflux (GERD) 09/29/2011  . Diabetes mellitus type 2, diet-controlled ([place])   . Other B-complex deficiencies 05/24/2011  . Other general symptoms  05/17/2011  . NASH (nonalcoholic steatohepatitis) 05/17/2011  . Cirrhosis of liver not due to alcohol  05/17/2011  . Personal history of colonic polyps 05/17/2011  . CAD-known CTO RCA with L-R collaterals 03/17/2011  . Syncope and collapse   . Dizziness   . Diaphoresis   . Chest pain   . Chest tightness   . Palpitations   . COPD (chronic obstructive pulmonary disease) ([place])   . GERD (gastroesophageal reflux disease)   . BPH (benign prostatic hypertrophy)   . HYPERLIPIDEMIA 11/19/2007  . OBESITY 11/19/2007  . HYPERTENSION 11/19/2007  . INTERNAL HEMORRHOIDS 11/19/2007  . GERD 11/19/2007  . DIVERTICULOSIS, COLON 11/19/2007  . BILIARY CIRRHOSIS, PRIMARY 11/19/2007  . BENIGN PROSTATIC HYPERTROPHY, HX OF 11/19/2007  . COLONIC POLYPS, ADENOMATOUS 07/08/2002    Orientation RESPIRATION BLADDER Height & Weight     Self, Time, Situation, Place  O2 (Nasal Canula 1 ltr) Continent, Indwelling catheter Weight: 136 lb 8 oz (61.9 kg) Height:  '5\' 6"'$  (167.6 cm)  BEHAVIORAL SYMPTOMS/MOOD NEUROLOGICAL BOWEL NUTRITION STATUS   (None)  (Dementia) Continent Diet (Regular)  AMBULATORY STATUS COMMUNICATION OF NEEDS Skin   Limited Assist Verbally Normal                       Personal Care Assistance Level of Assistance              Functional Limitations Info  Sight, Hearing, Speech  Sight Info: Adequate Hearing Info: Adequate Speech Info: Adequate    SPECIAL CARE FACTORS FREQUENCY  PT (By licensed PT), Diabetic urine testing, Blood pressure     PT Frequency: 5 times weekly              Contractures Contractures Info: Not present    Additional Factors Info  Code Status, Allergies Code Status Info: DNR Allergies Info: Ciprofloxacin, Penicillins           Current Medications  (08/13/2016):  This is the current hospital active medication list Current Facility-Administered Medications  Medication Dose Route Frequency Provider Last Rate Last Dose  . acetaminophen (TYLENOL) tablet 650 mg  650 mg Oral Q6H PRN Norman Herrlich, MD       Or  . acetaminophen (TYLENOL) suppository 650 mg  650 mg Rectal Q6H PRN Norman Herrlich, MD      . azithromycin [facility]) tablet 250 mg  250 mg Oral Daily Norman Herrlich, MD   250 mg at 08/13/16 1008  . cefTRIAXone (ROCEPHIN) 1 g in dextrose 5 % 50 mL IVPB  1 g Intravenous Q24H Rachel L Rumbarger, RPH   1 g at 08/13/16 1004  . digoxin (LANOXIN) tablet 125 mcg  125 mcg Oral Daily Norman Herrlich, MD   125 mcg at 08/13/16 1007  . donepezil (ARICEPT) tablet 5 mg  5 mg Oral QHS Norman Herrlich, MD   5 mg at 08/12/16 2218  . doxazosin (CARDURA) tablet 4 mg  4 mg Oral QHS Norman Herrlich, MD   4 mg at 08/12/16 2218  . famotidine (PEPCID) tablet 20 mg  20 mg Oral Daily Norman Herrlich, MD   20 mg at 08/13/16 1009  . feeding supplement (ENSURE ENLIVE) (ENSURE ENLIVE) liquid 237 mL  237 mL Oral TID BM Norman Herrlich, MD   237 mL at 08/13/16 1009  . finasteride (PROSCAR) tablet 5 mg  5 mg Oral Daily Norman Herrlich, MD   5 mg at 08/13/16 1008  . furosemide (LASIX) tablet 20 mg  20 mg Oral Daily Norman Herrlich, MD   20 mg at 08/13/16 1009  . guaiFENesin-codeine 100-10 MG/5ML solution 5 mL  5 mL Oral Q6H PRN Maryellen Pile, MD   5 mL at 08/13/16 1009  . ipratropium-albuterol (DUONEB) 0.5-2.5 (3) MG/3ML nebulizer solution 3 mL  3 mL Nebulization TID Sid Falcon, MD      . isosorbide mononitrate (IMDUR) 24 hr tablet 30 mg  30 mg Oral Daily Norman Herrlich, MD   30 mg at 08/13/16 1008  . LORazepam (ATIVAN) tablet 0.25 mg  0.25 mg Oral Q8H PRN Norman Herrlich, MD      . Melatonin TABS 3 mg  3 mg Oral QHS Norman Herrlich, MD   3 mg at 08/12/16 2218  . metoprolol tartrate (LOPRESSOR) tablet 12.5 mg  12.5 mg Oral BID Norman Herrlich, MD   12.5 mg at 08/13/16 1008  .  mometasone-formoterol (DULERA) 200-5 MCG/ACT inhaler 2 puff  2 puff Inhalation BID Norman Herrlich, MD   2 puff at 08/13/16 0759  . predniSONE (DELTASONE) tablet 4 mg  4 mg Oral Q breakfast Norman Herrlich, MD   4 mg at 08/13/16 1007  . QUEtiapine (SEROQUEL) tablet 12.5 mg  12.5 mg Oral Daily Norman Herrlich, MD   12.5 mg at 08/13/16 1009  . Rivaroxaban (XARELTO) tablet 15 mg  15 mg Oral Daily Norman Herrlich, MD  15 mg at 08/13/16 1009  . senna-docusate (Senokot-S) tablet   Oral Daily PRN Norman Herrlich, MD         Discharge Medications: Please see discharge summary for a list of discharge medications.  Relevant Imaging Results:  Relevant Lab Results:   Additional Information ss#[id number]  Sharif Rendell B, LCSWA

## 2016-08-13 NOTE — Progress Notes (Signed)
  Date: 08/13/2016  Patient name: Jacobson Milan  Medical record number: [id number]  Date of birth: 1925-03-09   I have seen and evaluated [name] and discussed their care with the Residency Team. Briefly, Mr. Lecker is an 82yo man with PMH of sCHF, CAD, CKD< COPD, DM2, Afig, Dementia whop resented with SOB.  He lives in an ALF and was noted to be hypoxic which improved with oxygen. Further symptoms include chills, congestion, increased sputum production which was yellow in color.  He also noted feeling fatigued and not well.  He was brought to the ED where a CXR showed a left lung consolidation and he was noted to have a fever.    Soc Hx : Married, former smoker  Vitals:   08/13/16 0500 08/13/16 1002  BP: (!) 123/42 (!) 111/45  Pulse: 65 75  Resp: 18   Temp: 98.7 F (37.1 C)    Gen: Elderly man lying in bed, NAD Eyes: Anicteric sclerae HENT: [place] in place with O2, NCAT CV: RR, NR, no murmur Pulm: Decreased breath sounds throughout, slightly worse in the LLL, mild wheezing Abd: Soft, NT Ext: Thin, SCDs in place  Pertinent data  CXR with patchy infiltrate in LLL, flattening of diaphragms consistent with known COPD  Assessment and Plan: I have seen and evaluated the patient as outlined above. I agree with the formulated Assessment and Plan as detailed in the residents' note, with the following changes:   1. Community acquired PNA - Lives in ALF (not SNF), CXR consistent, requiring supplemental O2 and with fever makes pneumonia likely - WBC 13.7 - Curb 65 -  2 - recommends obs admission vs. Inpatient - Abx with rocephin and azithromycin - Sputum culture - If another fever, consider blood cultures - IS to bedside - Urinary strep and legionella assessment - Duonebs q 6 hours while awake given h/o COPD and mild wheezing on exam this morning.   2. Afib - On xarelto, continue.   For his chronic medication issues, he was started on his home medications.  He is on  chronic steroids at a low dose, 4mg , and these will be continued.  This is for a diagnosis of PMR.  I do not think this low dose would warrant stress dose steroids in this setting, but certainly if were to become more acutely ill, stress dose steroids should be considered.    DNR.    Sid Falcon, MD 10/14/201711:39 AM

## 2016-08-13 NOTE — Progress Notes (Signed)
Physical Therapy stated they can come see patient, but it may not be until after lunch. CSW called to make aware

## 2016-08-13 NOTE — Progress Notes (Signed)
Cory Lawrence to be D/C'd Home per MD order.  Discussed with the patient and all questions fully answered.  VSS, Skin clean, dry and intact without evidence of skin break down, no evidence of skin tears noted. IV catheter discontinued intact. Site without signs and symptoms of complications. Dressing and pressure applied.  Sp02 88% on Room Air. Patient placed on 2L [place] Sp02 94%. MD paged to make aware. PTAR taking patient to [facility].  [place], Lantz Hermann C 08/13/2016 2:31 PM

## 2016-08-13 NOTE — Discharge Summary (Signed)
Name: Cory Lawrence MRN: [id number] DOB: 1925-07-17 82 y.o. PCP: Leonard Downing, MD  Date of Admission: 08/12/2016  7:35 AM Date of Discharge: 08/13/2016 Attending Physician: Sid Falcon, MD  Discharge Diagnosis: 1. Community-acquired pneumonia 2. Urinary tract infection Discharge Medications:   Medication List    STOP taking these medications   multivitamin-prenatal 27-0.8 MG Tabs tablet     TAKE these medications   azithromycin 250 MG tablet Commonly known as:  ZITHROMAX Take 1 tablet (250 mg total) by mouth daily. Start taking on:  08/14/2016   cefUROXime 250 MG tablet Commonly known as:  CEFTIN Take 2 tablets (500 mg total) by mouth 2 (two) times daily with a meal.   cyanocobalamin 1000 MCG/ML injection Commonly known as:  (VITAMIN B-12) INJECT 1 ML (1,000 MCG TOTAL) INTO THE MUSCLE EVERY 30 (THIRTY) DAYS.   diclofenac sodium 1 % Gel Commonly known as:  VOLTAREN APPLY 2 GRAMS TOPICALLY 3 TIMES DAILY   digoxin 0.125 MG tablet Commonly known as:  LANOXIN TAKE 1 TABLET BY MOUTH DAILY   donepezil 5 MG tablet Commonly known as:  ARICEPT Take 1 tablet (5 mg total) by mouth at bedtime.   doxazosin 4 MG tablet Commonly known as:  CARDURA Take 1 tablet (4 mg total) by mouth at bedtime.   feeding supplement (ENSURE ENLIVE) Liqd Take 237 mLs by mouth 3 (three) times daily between meals.   feeding supplement (PRO-STAT SUGAR FREE 64) Liqd Take 30 mLs by mouth 2 (two) times daily.   FERREX 150 FORTE PLUS 50-100 MG Caps Take 1 capsule by mouth daily.   finasteride 5 MG tablet Commonly known as:  PROSCAR Take 1 tablet (5 mg total) by mouth daily.   Fluticasone-Salmeterol 250-50 MCG/DOSE Aepb Commonly known as:  ADVAIR DISKUS Inhale 1 puff into the lungs as needed. Shortness of breath. What changed:  when to take this  additional instructions   furosemide 20 MG tablet Commonly known as:  LASIX Take 20 mg by mouth daily.     HYDROcodone-acetaminophen 5-325 MG tablet Commonly known as:  NORCO/VICODIN Take 1 tablet by mouth every 6 (six) hours as needed for moderate pain.   Ipratropium-Albuterol 20-100 MCG/ACT Aers respimat Commonly known as:  COMBIVENT Inhale 1 puff into the lungs every 6 (six) hours as needed for wheezing. What changed:  Another medication with the same name was changed. Make sure you understand how and when to take each.   ipratropium-albuterol 0.5-2.5 (3) MG/3ML Soln Commonly known as:  DUONEB Take 3 mLs by nebulization every 6 (six) hours as needed. What changed:  when to take this  additional instructions   isosorbide mononitrate 30 MG 24 hr tablet Commonly known as:  IMDUR TAKE 1 TABLET (30 MG TOTAL) BY MOUTH DAILY.   LORazepam 0.5 MG tablet Commonly known as:  ATIVAN Take 0.25 mg by mouth every 8 (eight) hours as needed for anxiety.   Melatonin 5 MG Tabs Take 5 mg by mouth at bedtime.   metoprolol tartrate 25 MG tablet Commonly known as:  LOPRESSOR Take 0.5 tablets (12.5 mg total) by mouth 2 (two) times daily.   pantoprazole 40 MG tablet Commonly known as:  PROTONIX TAKE 1 TABLET (40 MG TOTAL) BY MOUTH DAILY.   predniSONE 1 MG tablet Commonly known as:  DELTASONE TAKE 4 TABLETS DAILY FOR DOSAGE OF 4 MG A DAY What changed:  See the new instructions.   QUEtiapine 25 MG tablet Commonly known as:  SEROQUEL Take 6.25 mg  by mouth 2 (two) times daily.   ranitidine 150 MG tablet Commonly known as:  ZANTAC Take 150 mg by mouth 2 (two) times daily.   sennosides-docusate sodium 8.6-50 MG tablet Commonly known as:  SENOKOT-S Take 1 tablet by mouth daily as needed for constipation.   simvastatin 40 MG tablet Commonly known as:  ZOCOR TAKE 1 TABLET BY MOUTH EVERY DAY AT BEDTIME   ursodiol 250 MG tablet Commonly known as:  ACTIGALL TAKE 1 TABLET BY MOUTH TWICE A DAY   XARELTO 15 MG Tabs tablet Generic drug:  Rivaroxaban TAKE 1 TABLET BY MOUTH EVERY DAY What  changed:  See the new instructions.       Disposition and follow-up:   Mr.Cory Lawrence was discharged from [facility] in Good condition.  At the hospital follow up visit please address:  1.  Make sure the patient finishes his antibiotics. Please ensure that his inhaler spacer is cleaned daily.  2.  Labs / imaging needed at time of follow-up: None  3.  Pending labs/ test needing follow-up: None  Follow-up Appointments:   Hospital Course by problem list: Principal Problem:   CAP (community acquired pneumonia) Active Problems:   COPD (chronic obstructive pulmonary disease) ([place])   Diabetes mellitus type 2, diet-controlled ([place])   Chronic anticoagulation   Palliative care encounter   Dementia   Polymyalgia rheumatica ([place])   1. Community-acquired pneumonia The patient presented to the [facility] emergency department on 08/12/2016 with a several day history of fatigue, chills, cough and sputum production. At the time of admission the patient was febrile to 101.7 and had a leukocytosis of 13.7. Chest x-ray demonstrated a patchy left lung base consolidation concerning for pneumonia. He was started on azithromycin and ceftriaxone and given a DuoNeb nebulizer treatment for shortness of breath. He is admitted to the [facility] internal medicine teaching service for further workup and management of community-acquired pneumonia. Once on service the patient was continued on IV ceftriaxone and azithromycin overnight. By the morning of 08/13/2016 the patient greatly improved. He stated that his shortness of breath and cough and also improved. He had a good appetite and was admitted complaining about the hospital food. He seemed appropriate and medically stable for discharge. He will be sent home with a course of antibiotics that he will need to complete to ensure that he is treated adequately for his community acquired pneumonia. Additionally, the patient's wife has a lot of  concerns about the way nebulizers were given at his facility. It is our recommendation that the inhaler and spacer use with the inhaler are cleaned daily after use. Patient will be treated with azithromycin and a third-generation cephalosporin for his community-acquired pneumonia. Patient does have allergy to penicillins however he was given ceftriaxone in the hospital without any cross-reactivity.  2. Urinary tract infection The patient had a urinalysis and urine culture at time of admission in the emergency department. Urinalysis demonstrated 100 protein, nitrite positive with large leukocytes. Urine microscopy demonstrated many bacteria with only 0-5 squamous epithelial cells. Urine culture grew enterococcus and pseudomonas aeruginosa. Susceptibility studies showed only resistance to levofloxacin. The patient was discharged on cefuroxime for continued treatment of community-acquired pneumonia which will also cover the patient's urinary tract infection. He will finish his course of cefuroxime for both community-acquired pneumonia and a UTI.   Discharge Vitals:   BP (!) 111/45 (BP Location: Right Arm)   Pulse 75   Temp 98.7 F (37.1 C) (Oral)  Resp 18   Ht 5\' 6"  (1.676 m)   Wt 136 lb 8 oz (61.9 kg)   SpO2 97%   BMI 22.03 kg/m   Pertinent Labs, Studies, and Procedures:  1. Chest x-ray-patchy left lung base consolidation concerning for pneumonia  Discharge Instructions: Discharge Instructions    Diet - low sodium heart healthy    Complete by:  As directed    Discharge instructions    Complete by:  As directed    We are treating for pneumonia.  Please finish your antibiotics as prescribed.   Increase activity slowly    Complete by:  As directed      1. Finish antibiotics  Signed: Ophelia Shoulder, MD 08/13/2016, 10:14 AM   Pager: [phone]

## 2016-08-13 NOTE — Clinical Social Work Note (Signed)
Clinical Social Work Assessment  Patient Details  Name: Cory Lawrence MRN: [id number] Date of Birth: 06/27/1925  Date of referral:  08/13/16               Reason for consult:  Facility Placement, Discharge Planning                Permission sought to share information with:  Facility Sport and exercise psychologist, Family Supports Permission granted to share information::  Yes, Verbal Permission Granted  Name::     Museum/gallery curator::  [place]  Relationship::  Wife  Contact Information:  [phone]  Housing/Transportation Living arrangements for the past 2 months:  [place] of Information:  Medical Team, Spouse Patient Interpreter Needed:  None Criminal Activity/Legal Involvement Pertinent to Current Situation/Hospitalization:  No - Comment as needed Significant Relationships:  Spouse Lives with:  Facility Resident Do you feel safe going back to the place where you live?  Yes Need for family participation in patient care:  Yes (Comment)  Care giving concerns:  PT recommending SNF once medically stable for discharge. Pt is from [facility].    Social Worker assessment / plan:  CSW met with pt, no supports at bedside. Pt and spouse want SNF placement @ [facility]. Pt for discharge today. Pt will need Ptar. No further concerns. CSW encouraged pt and spouse to contact CSW as needed. CSW will continue to follow pt/spouse for support and facilitate DC to SNF today.  Employment status:  Retired Nurse, adult PT Recommendations:  [place] / Referral to community resources:  [place]  Patient/Family's Response to care:  Pt and spouse agreeable to SNF placement. Pt's spouse supportive and involved in pt's care. Pt and spouse appreciated SW intervention.  Patient/Family's Understanding of and Emotional Response to Diagnosis, Current Treatment, and Prognosis:   Pt and spouse understand the need for rehab at this time. Pt and spouse appear pleased with hospital care.  Emotional Assessment Appearance:  Appears stated age Attitude/Demeanor/Rapport:  Unable to Assess Affect (typically observed):  Unable to Assess Orientation:  Oriented to Self, Oriented to Place, Oriented to  Time, Oriented to Situation Alcohol / Substance use:  Never Used Psych involvement (Current and /or in the community):  No (Comment)  Discharge Needs  Concerns to be addressed:  Care Coordination Readmission within the last 30 days:  No Current discharge risk:  Dependent with Mobility Barriers to Discharge:  No Barriers Identified   Tristine Langi, [place], LCSWA 08/13/2016, 12:21 PM

## 2016-08-15 LAB — URINE CULTURE: Culture: >=100000 — AB

## 2016-08-16 LAB — LEGIONELLA PNEUMOPHILA SEROGP 1 UR AG: L. PNEUMOPHILA SEROGP 1 UR AG: POSITIVE

## 2016-08-17 ENCOUNTER — Other Ambulatory Visit: Payer: Self-pay | Admitting: Internal Medicine

## 2016-08-17 LAB — CULTURE, BLOOD (ROUTINE X 2)
Culture: NO GROWTH
Culture: NO GROWTH

## 2016-08-17 MED ORDER — AZITHROMYCIN 500 MG PO TABS: 500.0000 mg | ORAL_TABLET | Freq: Every day | ORAL | 0 refills | Status: AC

## 2016-08-17 NOTE — Progress Notes (Signed)
Pt's legionella antigen came positive. Called [facility] today and spoke with staff regarding azithromycin dose. He was d/c'd home with 7 day course of azithromycin. He received 2 days of azithromycin while admitted from 10/13-14. He received azithromycin 500mg  on admission and 250 mg thereafter. Will fax [phone]) an additional dose of azithromycin 500mg  for a total of 10 days tx. UTD recommends 7-10 days.    Julious Oka, MD Internal Medicine Resident, PGY [facility] Internal Medicine Program Pager: [phone]

## 2016-12-29 DEATH — deceased

## 2017-04-21 ENCOUNTER — Other Ambulatory Visit: Payer: Self-pay | Admitting: Nurse Practitioner

## 2017-07-01 ENCOUNTER — Other Ambulatory Visit: Payer: Self-pay | Admitting: Nurse Practitioner

## 2018-01-10 IMAGING — CR DG CHEST 2V
2 series · 2 of 2 positions shown · non-contrast
Comparison: 02/25/2014

CLINICAL DATA: Cough and wheezing

EXAM:
CHEST  2 VIEW

[PA]
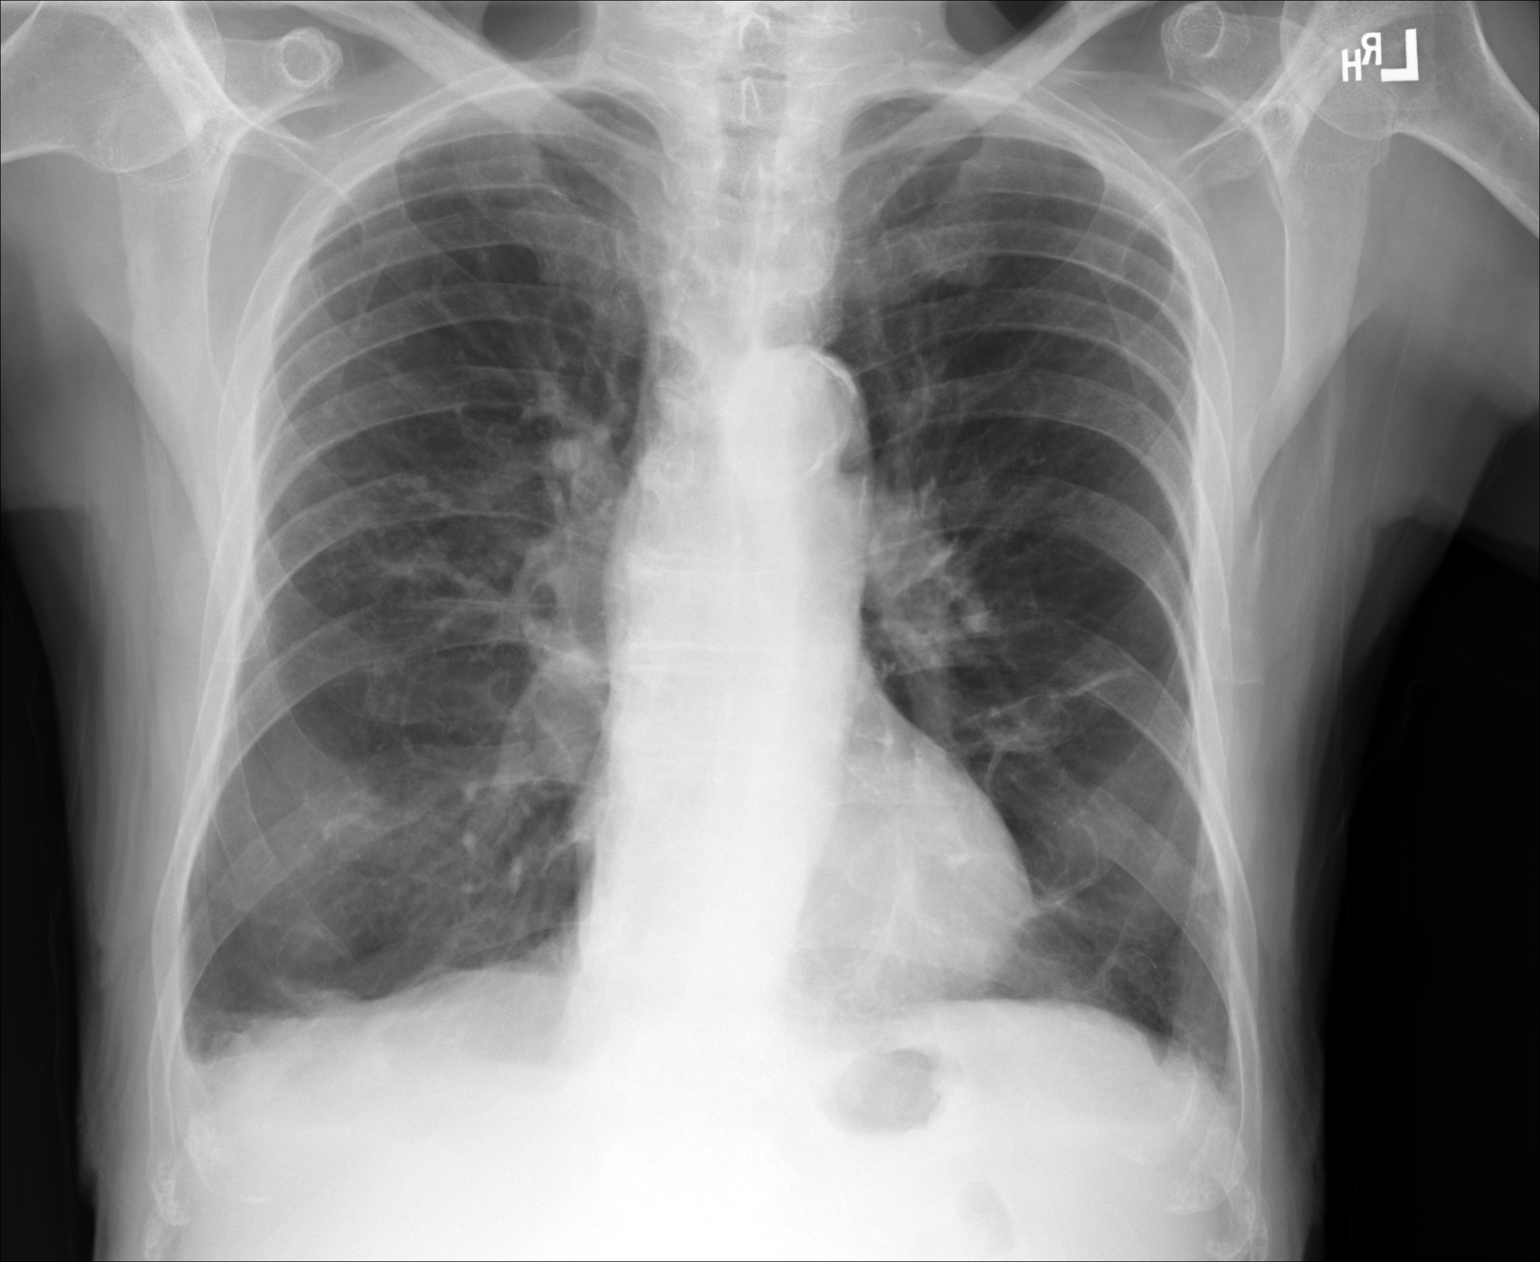

[lateral]
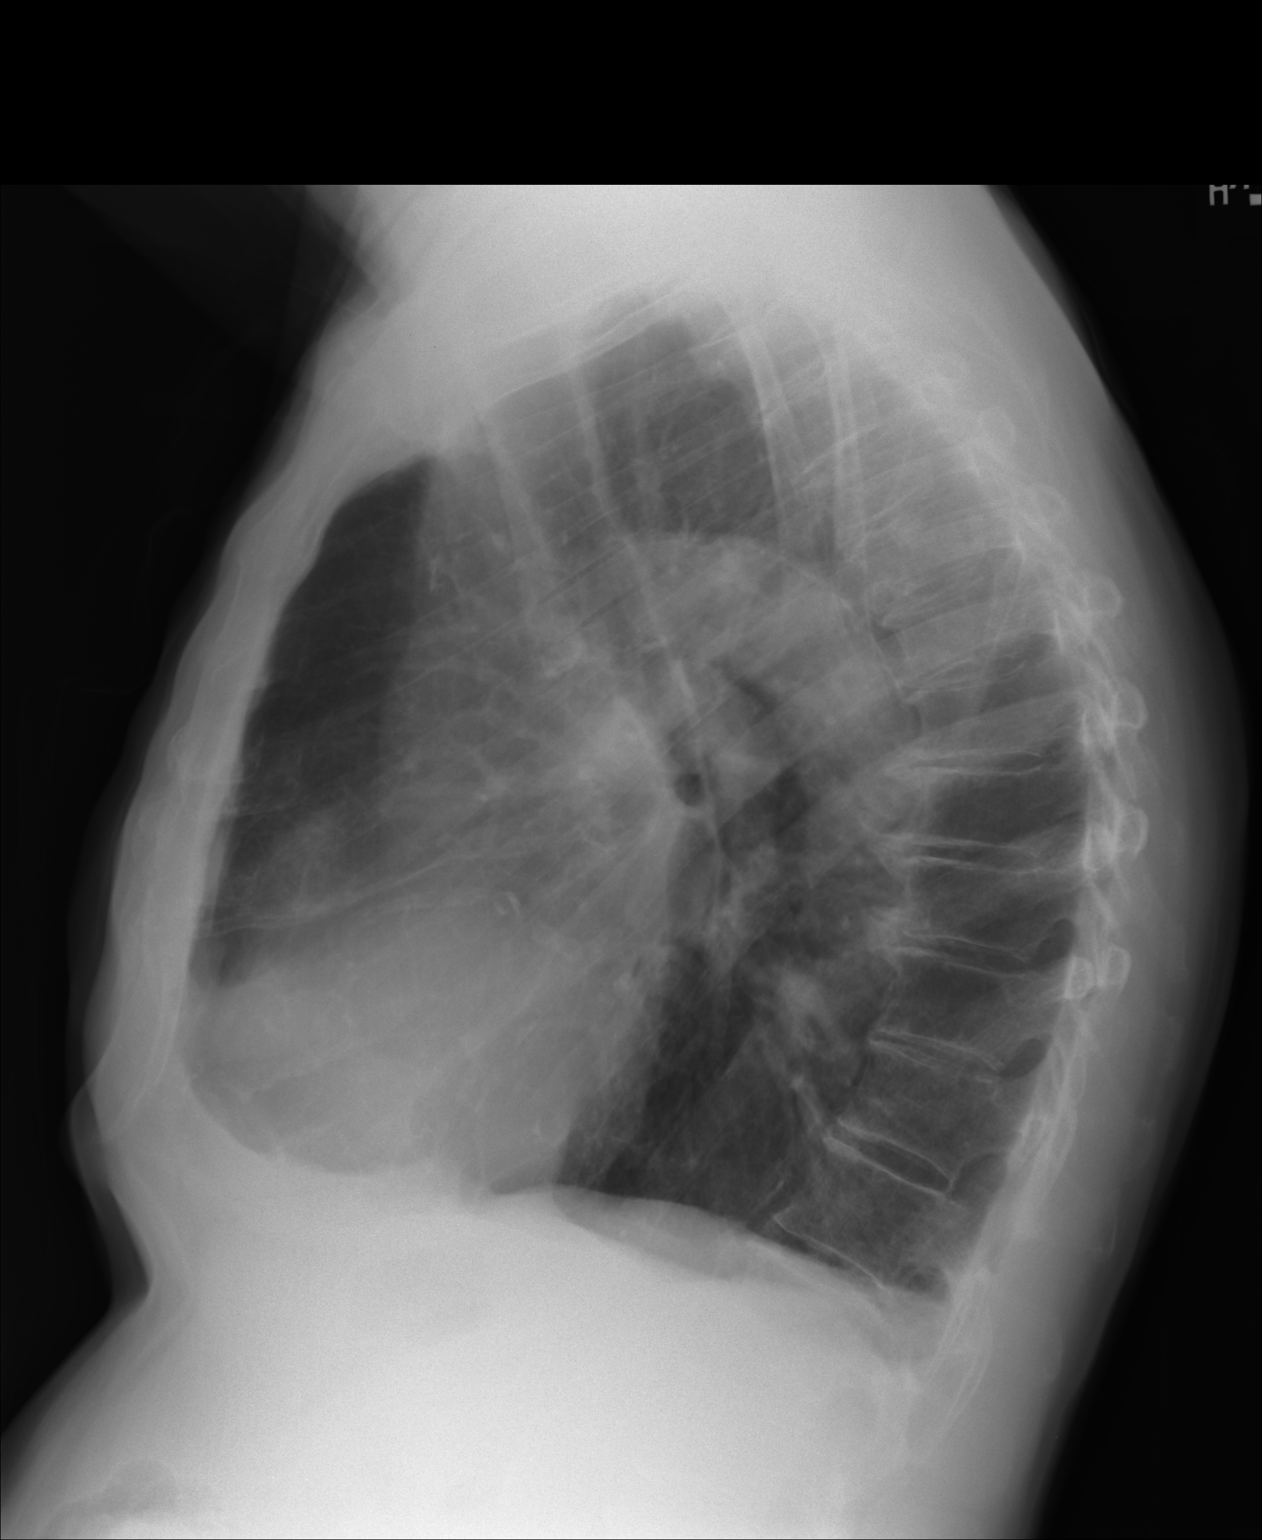

[2 of 2 positions shown; findings below may reference images not displayed]

FINDINGS: Cardiac shadow is within normal limits. Aortic calcifications are
again seen. The lungs are hyperinflated consistent with COPD. No
focal infiltrate or sizable effusion is noted. Some scarring is
noted bilaterally. Degenerative change of the thoracic spine is
noted.
IMPRESSION: COPD without acute abnormality.
